# Patient Record
Sex: Male | Born: 1940
Health system: Southern US, Community
[De-identification: ages and names within clinical notes are randomized; demographics above are authoritative.]

## PROBLEM LIST (undated history)

## (undated) DIAGNOSIS — I4819 Other persistent atrial fibrillation: Secondary | ICD-10-CM

## (undated) DIAGNOSIS — M47816 Spondylosis without myelopathy or radiculopathy, lumbar region: Secondary | ICD-10-CM

## (undated) DIAGNOSIS — M419 Scoliosis, unspecified: Secondary | ICD-10-CM

## (undated) DIAGNOSIS — M549 Dorsalgia, unspecified: Secondary | ICD-10-CM

## (undated) DIAGNOSIS — Z7901 Long term (current) use of anticoagulants: Secondary | ICD-10-CM

## (undated) DIAGNOSIS — Z8709 Personal history of other diseases of the respiratory system: Secondary | ICD-10-CM

## (undated) DIAGNOSIS — G20A1 Parkinson's disease without dyskinesia, without mention of fluctuations: Secondary | ICD-10-CM

## (undated) DIAGNOSIS — G20 Parkinson's disease: Secondary | ICD-10-CM

## (undated) HISTORY — DX: Dorsalgia, unspecified: M54.9

## (undated) HISTORY — DX: Scoliosis, unspecified: M41.9

## (undated) HISTORY — DX: Spondylosis without myelopathy or radiculopathy, lumbar region: M47.816

## (undated) HISTORY — PX: HERNIA REPAIR: SHX51

## (undated) HISTORY — DX: Parkinson's disease: G20

## (undated) HISTORY — PX: TONSILLECTOMY: SUR1361

## (undated) HISTORY — DX: Long term (current) use of anticoagulants: Z79.01

## (undated) HISTORY — DX: Other persistent atrial fibrillation: I48.19

## (undated) HISTORY — DX: Parkinson's disease without dyskinesia, without mention of fluctuations: G20.A1

---

## 2011-11-13 DIAGNOSIS — J40 Bronchitis, not specified as acute or chronic: Secondary | ICD-10-CM | POA: Diagnosis not present

## 2011-11-21 DIAGNOSIS — J209 Acute bronchitis, unspecified: Secondary | ICD-10-CM | POA: Diagnosis not present

## 2011-11-28 DIAGNOSIS — J209 Acute bronchitis, unspecified: Secondary | ICD-10-CM | POA: Diagnosis not present

## 2011-12-03 DIAGNOSIS — R05 Cough: Secondary | ICD-10-CM | POA: Diagnosis not present

## 2011-12-03 DIAGNOSIS — R0602 Shortness of breath: Secondary | ICD-10-CM | POA: Diagnosis not present

## 2011-12-03 DIAGNOSIS — J40 Bronchitis, not specified as acute or chronic: Secondary | ICD-10-CM | POA: Diagnosis not present

## 2012-07-30 DIAGNOSIS — I809 Phlebitis and thrombophlebitis of unspecified site: Secondary | ICD-10-CM | POA: Diagnosis not present

## 2012-07-30 DIAGNOSIS — M7989 Other specified soft tissue disorders: Secondary | ICD-10-CM | POA: Diagnosis not present

## 2012-07-30 DIAGNOSIS — L0390 Cellulitis, unspecified: Secondary | ICD-10-CM | POA: Diagnosis not present

## 2012-07-30 DIAGNOSIS — L0291 Cutaneous abscess, unspecified: Secondary | ICD-10-CM | POA: Diagnosis not present

## 2012-07-30 DIAGNOSIS — I803 Phlebitis and thrombophlebitis of lower extremities, unspecified: Secondary | ICD-10-CM | POA: Diagnosis not present

## 2012-07-30 DIAGNOSIS — L03119 Cellulitis of unspecified part of limb: Secondary | ICD-10-CM | POA: Diagnosis not present

## 2012-08-20 ENCOUNTER — Inpatient Hospital Stay (HOSPITAL_COMMUNITY)
Admission: EM | Admit: 2012-08-20 | Discharge: 2012-08-22 | DRG: 603 | Disposition: A | Discharge status: Home / Self Care | Payer: Medicare Other | Attending: Family Medicine | Admitting: Family Medicine

## 2012-08-20 ENCOUNTER — Emergency Department (HOSPITAL_COMMUNITY): Payer: Medicare Other

## 2012-08-20 ENCOUNTER — Encounter (HOSPITAL_COMMUNITY): Payer: Self-pay

## 2012-08-20 DIAGNOSIS — L0390 Cellulitis, unspecified: Secondary | ICD-10-CM | POA: Diagnosis not present

## 2012-08-20 DIAGNOSIS — L02419 Cutaneous abscess of limb, unspecified: Secondary | ICD-10-CM | POA: Diagnosis not present

## 2012-08-20 DIAGNOSIS — I803 Phlebitis and thrombophlebitis of lower extremities, unspecified: Secondary | ICD-10-CM | POA: Diagnosis not present

## 2012-08-20 DIAGNOSIS — L0291 Cutaneous abscess, unspecified: Secondary | ICD-10-CM | POA: Diagnosis not present

## 2012-08-20 DIAGNOSIS — L03116 Cellulitis of left lower limb: Secondary | ICD-10-CM | POA: Diagnosis present

## 2012-08-20 DIAGNOSIS — I809 Phlebitis and thrombophlebitis of unspecified site: Secondary | ICD-10-CM | POA: Diagnosis present

## 2012-08-20 DIAGNOSIS — N289 Disorder of kidney and ureter, unspecified: Secondary | ICD-10-CM | POA: Diagnosis present

## 2012-08-20 DIAGNOSIS — Z0389 Encounter for observation for other suspected diseases and conditions ruled out: Secondary | ICD-10-CM | POA: Diagnosis not present

## 2012-08-20 DIAGNOSIS — L03119 Cellulitis of unspecified part of limb: Principal | ICD-10-CM

## 2012-08-20 DIAGNOSIS — M7989 Other specified soft tissue disorders: Secondary | ICD-10-CM

## 2012-08-20 DIAGNOSIS — M25579 Pain in unspecified ankle and joints of unspecified foot: Secondary | ICD-10-CM | POA: Diagnosis not present

## 2012-08-20 HISTORY — DX: Personal history of other diseases of the respiratory system: Z87.09

## 2012-08-20 LAB — CBC WITH DIFFERENTIAL/PLATELET
Basophils Absolute: 0 10*3/uL (ref 0.0–0.1)
Eosinophils Absolute: 0.3 10*3/uL (ref 0.0–0.7)
Eosinophils Relative: 5 % (ref 0–5)
Lymphocytes Relative: 20 % (ref 12–46)
MCH: 31.3 pg (ref 26.0–34.0)
MCV: 88.8 fL (ref 78.0–100.0)
Platelets: 226 10*3/uL (ref 150–400)
RDW: 13.1 % (ref 11.5–15.5)
WBC: 6 10*3/uL (ref 4.0–10.5)

## 2012-08-20 LAB — COMPREHENSIVE METABOLIC PANEL
ALT: 17 U/L (ref 0–53)
AST: 17 U/L (ref 0–37)
Calcium: 9.1 mg/dL (ref 8.4–10.5)
Sodium: 138 mEq/L (ref 135–145)
Total Protein: 5.9 g/dL — ABNORMAL LOW (ref 6.0–8.3)

## 2012-08-20 MED ORDER — ONDANSETRON HCL 4 MG PO TABS: 4.0000 mg | ORAL_TABLET | Freq: Four times a day (QID) | ORAL | Status: DC | PRN

## 2012-08-20 MED ORDER — VANCOMYCIN HCL IN DEXTROSE 1-5 GM/200ML-% IV SOLN
1000.0000 mg | Freq: Once | INTRAVENOUS | Status: AC
  Administered 2012-08-20: 1000 mg via INTRAVENOUS
  Filled 2012-08-20: qty 200

## 2012-08-20 MED ORDER — SODIUM CHLORIDE 0.9 % IJ SOLN: 3.0000 mL | INTRAMUSCULAR | Status: DC | PRN

## 2012-08-20 MED ORDER — SODIUM CHLORIDE 0.9 % IV SOLN
INTRAVENOUS | Status: AC
  Administered 2012-08-20: 12:00:00 via INTRAVENOUS

## 2012-08-20 MED ORDER — HYDROCODONE-ACETAMINOPHEN 5-325 MG PO TABS: 1.0000 | ORAL_TABLET | ORAL | Status: DC | PRN

## 2012-08-20 MED ORDER — ONDANSETRON HCL 4 MG/2ML IJ SOLN: 4.0000 mg | Freq: Four times a day (QID) | INTRAMUSCULAR | Status: DC | PRN

## 2012-08-20 MED ORDER — ONDANSETRON HCL 4 MG/2ML IJ SOLN: 4.0000 mg | Freq: Three times a day (TID) | INTRAMUSCULAR | Status: DC | PRN

## 2012-08-20 MED ORDER — INFLUENZA VIRUS VACC SPLIT PF IM SUSP
0.5000 mL | INTRAMUSCULAR | Status: AC
  Administered 2012-08-21: 0.5 mL via INTRAMUSCULAR
  Filled 2012-08-20: qty 0.5

## 2012-08-20 MED ORDER — IBUPROFEN 600 MG PO TABS
600.0000 mg | ORAL_TABLET | Freq: Four times a day (QID) | ORAL | Status: DC | PRN
  Filled 2012-08-20: qty 1

## 2012-08-20 MED ORDER — SODIUM CHLORIDE 0.9 % IV SOLN: 250.0000 mL | INTRAVENOUS | Status: DC | PRN

## 2012-08-20 MED ORDER — ACETAMINOPHEN 325 MG PO TABS: 650.0000 mg | ORAL_TABLET | Freq: Four times a day (QID) | ORAL | Status: DC | PRN

## 2012-08-20 MED ORDER — ACETAMINOPHEN 650 MG RE SUPP: 650.0000 mg | Freq: Four times a day (QID) | RECTAL | Status: DC | PRN

## 2012-08-20 MED ORDER — ENOXAPARIN SODIUM 40 MG/0.4ML ~~LOC~~ SOLN
40.0000 mg | SUBCUTANEOUS | Status: DC
  Administered 2012-08-20 – 2012-08-21 (×2): 40 mg via SUBCUTANEOUS
  Filled 2012-08-20 (×3): qty 0.4

## 2012-08-20 MED ORDER — SODIUM CHLORIDE 0.9 % IJ SOLN
3.0000 mL | Freq: Two times a day (BID) | INTRAMUSCULAR | Status: DC
  Administered 2012-08-21 – 2012-08-22 (×2): 3 mL via INTRAVENOUS

## 2012-08-20 MED ORDER — LEVOFLOXACIN IN D5W 500 MG/100ML IV SOLN
500.0000 mg | INTRAVENOUS | Status: DC
  Administered 2012-08-20 – 2012-08-21 (×2): 500 mg via INTRAVENOUS
  Filled 2012-08-20 (×2): qty 100

## 2012-08-20 MED ORDER — ONDANSETRON HCL 4 MG/2ML IJ SOLN
4.0000 mg | Freq: Once | INTRAMUSCULAR | Status: AC
  Administered 2012-08-20: 4 mg via INTRAVENOUS
  Filled 2012-08-20: qty 2

## 2012-08-20 MED ORDER — MORPHINE SULFATE 4 MG/ML IJ SOLN
4.0000 mg | Freq: Once | INTRAMUSCULAR | Status: AC
  Administered 2012-08-20: 4 mg via INTRAVENOUS
  Filled 2012-08-20: qty 1

## 2012-08-20 NOTE — H&P (Signed)
History and Physical  Sean Ward ZOX:[id number] DOB: 1941-01-26 DOA: 08/20/2012  Referring physician: Randel Books, MD PCP: No primary provider on file. None, just moved to the area  Chief Complaint: foot swelling  HPI:  71 year old man no significant PMH presented with nearly 3 week history of LLE/foot edema, some erythema; EDP diagnosed with cellulitis and referred for admission.  Symptoms began 3 weeks ago with edema and some redness of the foot. No injury. Seen at ED in [facility] 10/9, diagnosed with phlebitis, started on 5 days of Keflex and PRN narcotics without relief. Was compliant with medications but had no improvement. Edema especially worse at night in foot and calf. Localizes pain to medial malleolus and plantar surface.. No history of injury to LLE or previous illnesses or problems with that leg. Very active, plays tennis frequently.  In ED was afebrile with stable vitals. CBC, CMP unremarkable. Ankle x-ray negative.  Review of Systems:   Negative for fever, visual changes, sore throat,chest pain, SOB, dysuria, bleeding, n/v/abdominal pain.   Past Medical History  Diagnosis Date  . History of bronchitis     Past Surgical History  Procedure Date  . None     Social History:  reports that he has never smoked. He has never used smokeless tobacco. He reports that he drinks alcohol. He reports that he does not use illicit drugs.  No Known Allergies  History reviewed. No pertinent family history. He denies any particular medical problems in family.   Prior to Admission medications   Medication Sig Start Date End Date Taking? Authorizing Provider  ibuprofen (ADVIL,MOTRIN) 200 MG tablet Take 200-400 mg by mouth every 6 (six) hours as needed. pain   Yes Historical Provider, MD  Multiple Vitamin (MULTIVITAMIN WITH MINERALS) TABS Take 1 tablet by mouth daily.   Yes Historical Provider, MD  oxyCODONE-acetaminophen (PERCOCET/ROXICET) 5-325 MG per tablet Take 1 tablet by mouth  every 4 (four) hours as needed. Pain   Yes Historical Provider, MD  cephALEXin (KEFLEX) 500 MG capsule Take 500 mg by mouth 3 (three) times daily. 5 day supply started on 07/31/12    Historical Provider, MD   Physical Exam: Filed Vitals:   08/20/12 1048 08/20/12 1053 08/20/12 1054 08/20/12 1403  BP: 123/64 115/74 132/78 138/77  Pulse: 69 75 66 77  Temp:    98 F (36.7 C)  TempSrc:    Oral  Resp:    18  Height:      Weight:      SpO2:    99%    General:  Appears calm and comfortable Eyes: PERRL, normal lids, irises ENT: grossly normal hearing, lips & tongue Neck: no LAD, masses or thyromegaly Cardiovascular: RRR, no m/r/g. No RLE edema. Respiratory: CTA bilaterally, no w/r/r. Normal respiratory effort. Abdomen: soft, ntnd Skin: right foot appears normal. Left foot edematous with loss of bony contours. Mild erythema of ankle, dorsum of foot. Plantar surface unremarkable. Blister between 3/4 toe, dry. No other lesions. Minimal pain with palpation of ankle and foot. Obvious inflamed vein left foot (greater saphenous), non-tender, erythema confined to just above ankle. Musculoskeletal: grossly normal tone BUE/BLE Psychiatric: grossly normal mood and affect, speech fluent and appropriate Neurologic: grossly non-focal.  Labs on Admission:  Basic Metabolic Panel:  Lab 08/20/12 8119  NA 138  K 3.6  CL 103  CO2 25  GLUCOSE 91  BUN 21  CREATININE 1.08  CALCIUM 9.1  MG --  PHOS --    Liver Function Tests:  Lab 08/20/12  1000  AST 17  ALT 17  ALKPHOS 107  BILITOT 0.7  PROT 5.9*  ALBUMIN 3.2*   CBC:  Lab 08/20/12 1000  WBC 6.0  NEUTROABS 3.9  HGB 14.3  HCT 40.6  MCV 88.8  PLT 226    Cardiac Enzymes:  Lab 08/20/12 1000  CKTOTAL --  CKMB --  CKMBINDEX --  TROPONINI <0.30   Radiological Exams on Admission: Dg Ankle Complete Left  08/20/2012  *RADIOLOGY REPORT*  Clinical Data: Ankle pain.  LEFT ANKLE COMPLETE - 3+ VIEW  Comparison: None.  Findings: No  fracture or dislocation is noted.  Joint spaces are intact.  Soft tissue swelling is seen over lateral malleolus.  IMPRESSION: No bony abnormalities seen.  Soft tissue swelling seen over lateral malleolus suggesting cellulitis or ligamentous injury.   Original Report Authenticated By: Venita Sheffield., M.D.     Active Problems:  * No active hospital problems. *    Assessment/Plan 1. LLE cellulitis--previously treated with Keflex. Doubt MRSA. Empiric Levaquin. No evidence of suppurative thrombophlebitis. 2. LLE superficial phlebitis--elevation, ice/heat compresses, compression stocking.  Code Status: full  Family Communication: none present Disposition Plan/Anticipated LOS: 2-3 days  Time spent: 55 minutes  [name], MD  [facility] Team 6 Pager [phone]. If 8PM-8AM, please contact night-coverage at [email or web address], password [facility] 08/20/2012, 4:32 PM

## 2012-08-20 NOTE — ED Notes (Signed)
MD at bedside. 

## 2012-08-20 NOTE — ED Notes (Signed)
Pt reports cellulitis in LLE x 3 weeks.  Sts he completed prescribed antibiotic, but "it hasn't gotten any better."  Sts vertigo x 2 weeks.  Sts pain in LLE when standing.  Edema in LLE noted.

## 2012-08-20 NOTE — Progress Notes (Signed)
No pcp listed Cm spoke to pt who confirms no pcp but knows how to obtain one when needed

## 2012-08-20 NOTE — ED Notes (Signed)
EDP Rancour present to evaluate this pt.  Pt reports increased swelling since treatment for cellulitis 07/30/12.  Good CMS- pulses palpable to lower ext.

## 2012-08-20 NOTE — ED Provider Notes (Signed)
History     CSN: [id number]  Arrival date & time 08/20/12  0919   First MD Initiated Contact with Patient 08/20/12 0940      Chief Complaint  Patient presents with  . Dizziness  . Cellulitis    (Consider location/radiation/quality/duration/timing/severity/associated sxs/prior treatment) HPI Comments: Patient presents with pain and swelling to his left lower charities for the past 3 weeks. He states it started swelling around October 7 denies any cuts or falls or trauma. He was seen at the ED in [facility] on October 9 and treated for cellulitis and phlebitis with Keflex. He stated he had a negative Doppler ultrasound then. He completed a course of antibiotics but the pain and swelling is worsening. Denies any fevers, chills, nausea vomiting. Denies any chest pain or shortness of breath. No recent travel. He denies any other medical problems. He endorses he said intermittent lightheadedness over the past several weeks it is positional. Denies vertigo, contrary to triage note.   The history is provided by the patient.    Past Medical History  Diagnosis Date  . History of bronchitis     Past Surgical History  Procedure Date  . None     History reviewed. No pertinent family history.  History  Substance Use Topics  . Smoking status: Never Smoker   . Smokeless tobacco: Never Used  . Alcohol Use: Yes     wine occasionally      Review of Systems  Constitutional: Negative for fever, activity change and appetite change.  HENT: Negative for congestion and rhinorrhea.   Respiratory: Negative for cough, chest tightness and shortness of breath.   Gastrointestinal: Negative for nausea, vomiting and abdominal pain.  Genitourinary: Negative for dysuria and hematuria.  Musculoskeletal: Negative for back pain.  Skin: Positive for wound. Negative for rash.  Neurological: Positive for dizziness and light-headedness. Negative for headaches.    Allergies  Review of patient's  allergies indicates no known allergies.  Home Medications   No current outpatient prescriptions on file.  BP 138/77  Pulse 77  Temp 98 F (36.7 C) (Oral)  Resp 18  Ht 5\' 8"  (1.727 m)  Wt 172 lb (78.019 kg)  BMI 26.15 kg/m2  SpO2 99%  Physical Exam  Constitutional: He is oriented to person, place, and time. He appears well-developed and well-nourished. No distress.  HENT:  Head: Normocephalic and atraumatic.  Mouth/Throat: Oropharynx is clear and moist. No oropharyngeal exudate.  Eyes: Conjunctivae normal and EOM are normal. Pupils are equal, round, and reactive to light.  Neck: Normal range of motion. Neck supple.  Cardiovascular: Normal rate, regular rhythm and normal heart sounds.   No murmur heard. Pulmonary/Chest: Effort normal and breath sounds normal. No respiratory distress.  Abdominal: Soft. There is no tenderness. There is no rebound and no guarding.  Musculoskeletal: Normal range of motion. He exhibits edema and tenderness.       Edema and tenderness to distal L tibia, ankle, foot.  +2 DP and PT pulses. Compartments soft, no palpable cord.  TTP over anterior shin.  No open wounds. FROM ankle without pain.  Neurological: He is alert and oriented to person, place, and time. No cranial nerve deficit.       5/5 strength throughout, no ataxia on finger to nose, no nystagmus. Test of skew negative, head impulse testing negative, normal gait.  Skin: Skin is warm.    ED Course  Procedures (including critical care time)  Labs Reviewed  COMPREHENSIVE METABOLIC PANEL - Abnormal;  Notable for the following:    Total Protein 5.9 (*)     Albumin 3.2 (*)     GFR calc non Af Amer 68 (*)     GFR calc Af Amer 78 (*)     All other components within normal limits  CBC WITH DIFFERENTIAL  TROPONIN I   Dg Ankle Complete Left  08/20/2012  *RADIOLOGY REPORT*  Clinical Data: Ankle pain.  LEFT ANKLE COMPLETE - 3+ VIEW  Comparison: None.  Findings: No fracture or dislocation is noted.   Joint spaces are intact.  Soft tissue swelling is seen over lateral malleolus.  IMPRESSION: No bony abnormalities seen.  Soft tissue swelling seen over lateral malleolus suggesting cellulitis or ligamentous injury.   Original Report Authenticated By: Venita Sheffield., M.D.      1. Cellulitis       MDM  LLE swelling, warmth x 2 weeks.  Also positional lightheadedness that is intermittent and worse with standing.  No CP or SOB.  No vertigo. Treated for cellulitis/phelbitis with keflex.  Exam not overly impressive for cellulitis. No evidence of septic joint. Doppler negative for DVT. Will treat with IV antibiotics, observation as patient has essentially failed outpatient treatment.    Date: 08/20/2012  Rate: 63  Rhythm: normal sinus rhythm  QRS Axis: normal  Intervals: normal  ST/T Wave abnormalities: normal  Conduction Disutrbances:none  Narrative Interpretation:   Old EKG Reviewed: none available      Glynn Octave, MD 08/20/12 1643

## 2012-08-20 NOTE — Progress Notes (Signed)
Left:  No evidence of DVT, superficial thrombosis, or Baker's cyst.  Right:  Negative for DVT in the common femoral vein.  

## 2012-08-20 NOTE — ED Notes (Signed)
Vascular  US present at bs.  Delorise Shiner NT aware of pending EKG and orthostatics.

## 2012-08-20 NOTE — Progress Notes (Signed)
Initial review for inpatient status is complete. 

## 2012-08-21 LAB — BASIC METABOLIC PANEL
Chloride: 104 mEq/L (ref 96–112)
GFR calc Af Amer: 59 mL/min — ABNORMAL LOW (ref >90)
GFR calc non Af Amer: 51 mL/min — ABNORMAL LOW (ref >90)
Glucose, Bld: 102 mg/dL — ABNORMAL HIGH (ref 70–99)
Potassium: 4 mEq/L (ref 3.5–5.1)
Sodium: 139 mEq/L (ref 135–145)

## 2012-08-21 LAB — CBC
HCT: 40.6 % (ref 39.0–52.0)
Hemoglobin: 14.2 g/dL (ref 13.0–17.0)
MCHC: 35 g/dL (ref 30.0–36.0)
RBC: 4.54 MIL/uL (ref 4.22–5.81)
WBC: 7.2 10*3/uL (ref 4.0–10.5)

## 2012-08-21 NOTE — Progress Notes (Signed)
[facility] PROGRESS NOTE  Braden Cimo XBJ:[id number] DOB: 10/09/1941 DOA: 13-Sep-2012 PCP: No primary provider on file. None, just moved to the area  Assessment/Plan: 1. LLE cellulitis--improving. Continue empiric Levaquin. No evidence of suppurative thrombophlebitis. 2. LLE superficial phlebitis--improving with elevation, ice/heat compresses, compression stocking. 3. Mild renal insufficiency--check BMP in AM. Etiology and significance unclear.  Code Status: full  Family Communication: none present  Disposition Plan: likely home 11/1  [name], MD  [facility] Team 6 Pager [phone]. If 8PM-8AM, please contact night-coverage at [email or web address], password [facility] 08/21/2012, 5:15 PM  LOS: 1 day   Brief narrative: 71 year old man no significant PMH presented with nearly 3 week history of LLE/foot edema, some erythema; EDP diagnosed with cellulitis and referred for admission.  Consultants:  none  Procedures:  none  Antibiotics:  Levaquin 09/14/2023 >>  HPI/Subjective: Feels better, less pain, redness and edema decreasing, walking without difficulty.  Objective: Filed Vitals:   09-13-2012 1403 09-13-12 2148 08/21/12 0548 08/21/12 1408  BP: 138/77 127/65 105/54 116/68  Pulse: 77 69 69 76  Temp: 98 F (36.7 C) 98.1 F (36.7 C) 98.6 F (37 C) 97.5 F (36.4 C)  TempSrc: Oral Oral Oral Oral  Resp: 18 16 16 18   Height:      Weight:      SpO2: 99% 97% 96% 100%    Intake/Output Summary (Last 24 hours) at 08/21/12 1715 Last data filed at 08/21/12 0900  Gross per 24 hour  Intake    680 ml  Output      0 ml  Net    680 ml   Filed Weights   09-13-12 0936  Weight: 78.019 kg (172 lb)    Exam:  General:  Appears calm and comfortable Cardiovascular: RRR, no m/r/g. No RLE edema. Respiratory: CTA bilaterally, no w/r/r. Normal respiratory effort. Abdomen: soft, ntnd Skin: decreased erythema of ankle, vein, foot. Decreased edema with skin wrinkling. No pain  with palpation of medial malleolus or sole of foot. Musculoskeletal: grossly normal tone BUE/BLE Psychiatric: grossly normal mood and affect, speech fluent and appropriate  Data Reviewed: Basic Metabolic Panel:  Lab 08/21/12 4696 September 13, 2012 1000  NA 139 138  K 4.0 3.6  CL 104 103  CO2 27 25  GLUCOSE 102* 91  BUN 15 21  CREATININE 1.36* 1.08  CALCIUM 9.0 9.1  MG -- --  PHOS -- --   Liver Function Tests:  Lab 09-13-12 1000  AST 17  ALT 17  ALKPHOS 107  BILITOT 0.7  PROT 5.9*  ALBUMIN 3.2*   CBC:  Lab 08/21/12 0430 13-Sep-2012 1000  WBC 7.2 6.0  NEUTROABS -- 3.9  HGB 14.2 14.3  HCT 40.6 40.6  MCV 89.4 88.8  PLT 220 226   Cardiac Enzymes:  Lab September 13, 2012 1000  CKTOTAL --  CKMB --  CKMBINDEX --  TROPONINI <0.30   Studies: Dg Ankle Complete Left  09/13/2012  *RADIOLOGY REPORT*  Clinical Data: Ankle pain.  LEFT ANKLE COMPLETE - 3+ VIEW  Comparison: None.  Findings: No fracture or dislocation is noted.  Joint spaces are intact.  Soft tissue swelling is seen over lateral malleolus.  IMPRESSION: No bony abnormalities seen.  Soft tissue swelling seen over lateral malleolus suggesting cellulitis or ligamentous injury.   Original Report Authenticated By: Venita Sheffield., M.D.    Scheduled Meds:   . sodium chloride   Intravenous STAT  . enoxaparin (LOVENOX) injection  40 mg Subcutaneous Q24H  . influenza  inactive virus vaccine  0.5  mL Intramuscular Tomorrow-1000  . levofloxacin (LEVAQUIN) IV  500 mg Intravenous Q24H  . sodium chloride  3 mL Intravenous Q12H   Continuous Infusions:   Active Problems:  Left leg cellulitis  Superficial phlebitis     [name], MD  [facility] Team 6 Pager [phone]. If 8PM-8AM, please contact night-coverage at [email or web address], password [facility] 08/21/2012, 5:15 PM  LOS: 1 day   Time spent: 20 minutes

## 2012-08-22 LAB — BASIC METABOLIC PANEL
BUN: 20 mg/dL (ref 6–23)
CO2: 26 mEq/L (ref 19–32)
Chloride: 104 mEq/L (ref 96–112)
GFR calc Af Amer: 70 mL/min — ABNORMAL LOW (ref >90)
Potassium: 3.8 mEq/L (ref 3.5–5.1)

## 2012-08-22 MED ORDER — LEVOFLOXACIN 500 MG PO TABS: 500.0000 mg | ORAL_TABLET | Freq: Every day | ORAL | Status: DC

## 2012-08-22 NOTE — Discharge Summary (Signed)
Physician Discharge Summary  Sean Ward ZOX:[id number] DOB: December 04, 1940 DOA: 09-Sep-2012  PCP: No primary provider on file. None, just moved to the area  Admit date: 09/09/2012 Discharge date: 08/22/2012  Recommendations for Outpatient Follow-up:  1. Follow-up resolution of cellulitis and superficial phlebitis. 2. General health screen/physical   Discharge Diagnoses:  1. LLE cellulitis 2. LLE superficial phlebitis   Discharge Condition: improved Disposition: home  Diet recommendation: regular  History of present illness:  71 year old man no significant PMH presented with nearly 3 week history of LLE/foot edema, some erythema; EDP diagnosed with cellulitis and referred for admission. No history of injury. Foot exam was notable for inflamed superficial vein, erythema and healing/dry blister between toes.  Hospital Course:  Sean Ward was admitted for treatment of persistent cellulitis, superficial phlebitis and edema of the left foot. X-ray of ankle suggested cellulitis, there was no evidence of fracture. LLE venous duplex was negative for DVT. Given no history of trauma and minimal pain ligamentous injury was not thought likely. Cellulitis and superficial phlebitis rapidly improved with IV Levaquin, elevation, ice and compression hose. Patient is ambulating without difficulty. He just moved to the area, has insurance and is willing to establish with PCP, which has been recommended. Referral to insurance documentation for accepting physicians was recommended and outpatient follow-up in 1-2 weeks was recommended.  1. LLE cellulitis--rapidly improving. Continue empiric Levaquin. No evidence of suppurative thrombophlebitis.  2. LLE superficial phlebitis--continues to improve with elevation, ice/heat compresses, compression stocking.  3. Mild renal insufficiency--resolved, no further evaluation suggested.  Consultants: 1. 10/30--LLE venous duplex--No evidence of deep vein or superficial  thrombosis involving the left lower extremity and right common femoral vein.  Procedures:  none  Antibiotics:  Levaquin 10-Sep-2023 >> 11/5  Discharge Instructions  Discharge Orders    Future Orders Please Complete By Expires   Diet general      Increase activity slowly      Discharge instructions      Comments:   Be sure to complete antibiotic (Levaquin). Continue to keep left leg and foot elevated when not ambulating. Continue compression stocking as tolerated. Ice ankle as tolerated. Establishing with a primary care physician in the area is recommended, with follow-up in 1-2 weeks. Call physician or seek immediate medical attention for increased pain, swelling, redness.       Medication List     As of 08/22/2012 10:58 AM    STOP taking these medications         cephALEXin 500 MG capsule   Commonly known as: KEFLEX      oxyCODONE-acetaminophen 5-325 MG per tablet   Commonly known as: PERCOCET/ROXICET      TAKE these medications         ibuprofen 200 MG tablet   Commonly known as: ADVIL,MOTRIN   Take 200-400 mg by mouth every 6 (six) hours as needed. pain      levofloxacin 500 MG tablet   Commonly known as: LEVAQUIN   Take 1 tablet (500 mg total) by mouth daily. Take first dose 11/1 at 6 pm.      multivitamin with minerals Tabs   Take 1 tablet by mouth daily.          The results of significant diagnostics from this hospitalization (including imaging, microbiology, ancillary and laboratory) are listed below for reference.    Significant Diagnostic Studies: Dg Ankle Complete Left  September 09, 2012  *RADIOLOGY REPORT*  Clinical Data: Ankle pain.  LEFT ANKLE COMPLETE - 3+ VIEW  Comparison: None.  Findings: No fracture or dislocation is noted.  Joint spaces are intact.  Soft tissue swelling is seen over lateral malleolus.  IMPRESSION: No bony abnormalities seen.  Soft tissue swelling seen over lateral malleolus suggesting cellulitis or ligamentous injury.   Original Report  Authenticated By: Venita Sheffield., M.D.    Labs: Basic Metabolic Panel:  Lab 08/22/12 1610 08/21/12 0430 08/20/12 1000  NA 138 139 138  K 3.8 4.0 3.6  CL 104 104 103  CO2 26 27 25   GLUCOSE 88 102* 91  BUN 20 15 21   CREATININE 1.18 1.36* 1.08  CALCIUM 8.5 9.0 9.1  MG -- -- --  PHOS -- -- --   Liver Function Tests:  Lab 08/20/12 1000  AST 17  ALT 17  ALKPHOS 107  BILITOT 0.7  PROT 5.9*  ALBUMIN 3.2*   CBC:  Lab 08/21/12 0430 08/20/12 1000  WBC 7.2 6.0  NEUTROABS -- 3.9  HGB 14.2 14.3  HCT 40.6 40.6  MCV 89.4 88.8  PLT 220 226   Cardiac Enzymes:  Lab 08/20/12 1000  CKTOTAL --  CKMB --  CKMBINDEX --  TROPONINI <0.30   Active Problems:  Left leg cellulitis  Superficial phlebitis   Time coordinating discharge: 20 minutes  Signed:  [name], MD [facility] 08/22/2012, 10:58 AM

## 2012-08-22 NOTE — Progress Notes (Signed)
[facility] PROGRESS NOTE  Sean Ward:[id number] DOB: 1940/12/11 DOA: 2012-09-06 PCP: No primary provider on file. None, just moved to the area  Assessment/Plan: 1. LLE cellulitis--rapidly improving. Continue empiric Levaquin. No evidence of suppurative thrombophlebitis. 2. LLE superficial phlebitis--continues to improve with elevation, ice/heat compresses, compression stocking. 3. Mild renal insufficiency--resolved, no further evaluation suggested.  Code Status: full  Family Communication: none present  Disposition Plan: home today  [name], MD  [facility] Team 6 Pager [phone]. If 8PM-8AM, please contact night-coverage at [email or web address], password [facility] 08/22/2012, 10:48 AM  LOS: 2 days   Brief narrative: 71 year old man no significant PMH presented with nearly 3 week history of LLE/foot edema, some erythema; EDP diagnosed with cellulitis and referred for admission.  Consultants:  none  Procedures:  none  Antibiotics:  Levaquin 09/07/23 >> 11/5  HPI/Subjective: Continues to feel better, less foot and leg edema, less redness. Minimal pain. Ambulating without difficulty or pain.  Objective: Filed Vitals:   08/21/12 0548 08/21/12 1408 08/21/12 2100 08/22/12 0453  BP: 105/54 116/68 118/66 123/67  Pulse: 69 76 76 79  Temp: 98.6 F (37 C) 97.5 F (36.4 C) 98.7 F (37.1 C) 98.4 F (36.9 C)  TempSrc: Oral Oral Oral Oral  Resp: 16 18 18 16   Height:      Weight:      SpO2: 96% 100% 99% 98%    Intake/Output Summary (Last 24 hours) at 08/22/12 1048 Last data filed at 08/22/12 1045  Gross per 24 hour  Intake    920 ml  Output      0 ml  Net    920 ml   Filed Weights   09-06-12 0936  Weight: 78.019 kg (172 lb)    Exam:  General:  Appears calm and comfortable Cardiovascular: RRR, no m/r/g. No RLE edema. Respiratory: CTA bilaterally, no w/r/r. Normal respiratory effort. Skin: marked decreased erythema of superficial saphenous vein, foot.  Marked decreased edema. No significant pain with palpation of medial malleolus or sole of foot. Psychiatric: grossly normal mood and affect, speech fluent and appropriate  Data Reviewed: Basic Metabolic Panel:  Lab 08/22/12 8295 08/21/12 0430 09/06/12 1000  NA 138 139 138  K 3.8 4.0 3.6  CL 104 104 103  CO2 26 27 25   GLUCOSE 88 102* 91  BUN 20 15 21   CREATININE 1.18 1.36* 1.08  CALCIUM 8.5 9.0 9.1  MG -- -- --  PHOS -- -- --   Liver Function Tests:  Lab 2012-09-06 1000  AST 17  ALT 17  ALKPHOS 107  BILITOT 0.7  PROT 5.9*  ALBUMIN 3.2*   CBC:  Lab 08/21/12 0430 2012/09/06 1000  WBC 7.2 6.0  NEUTROABS -- 3.9  HGB 14.2 14.3  HCT 40.6 40.6  MCV 89.4 88.8  PLT 220 226   Cardiac Enzymes:  Lab 09-06-12 1000  CKTOTAL --  CKMB --  CKMBINDEX --  TROPONINI <0.30   Studies: Dg Ankle Complete Left  Sep 06, 2012  *RADIOLOGY REPORT*  Clinical Data: Ankle pain.  LEFT ANKLE COMPLETE - 3+ VIEW  Comparison: None.  Findings: No fracture or dislocation is noted.  Joint spaces are intact.  Soft tissue swelling is seen over lateral malleolus.  IMPRESSION: No bony abnormalities seen.  Soft tissue swelling seen over lateral malleolus suggesting cellulitis or ligamentous injury.   Original Report Authenticated By: Venita Sheffield., M.D.    Scheduled Meds:    . enoxaparin (LOVENOX) injection  40 mg Subcutaneous Q24H  . levofloxacin (LEVAQUIN)  IV  500 mg Intravenous Q24H  . sodium chloride  3 mL Intravenous Q12H   Continuous Infusions:   Active Problems:  Left leg cellulitis  Superficial phlebitis     [name], MD  [facility] Team 6 Pager [phone]. If 8PM-8AM, please contact night-coverage at [email or web address], password [facility] 08/22/2012, 10:48 AM  LOS: 2 days

## 2012-09-10 DIAGNOSIS — R609 Edema, unspecified: Secondary | ICD-10-CM | POA: Diagnosis not present

## 2012-09-17 DIAGNOSIS — E860 Dehydration: Secondary | ICD-10-CM | POA: Diagnosis not present

## 2012-09-17 DIAGNOSIS — L0291 Cutaneous abscess, unspecified: Secondary | ICD-10-CM | POA: Diagnosis not present

## 2012-09-17 DIAGNOSIS — L0390 Cellulitis, unspecified: Secondary | ICD-10-CM | POA: Diagnosis not present

## 2012-09-17 DIAGNOSIS — R609 Edema, unspecified: Secondary | ICD-10-CM | POA: Diagnosis not present

## 2012-11-27 DIAGNOSIS — R609 Edema, unspecified: Secondary | ICD-10-CM | POA: Diagnosis not present

## 2012-12-10 ENCOUNTER — Other Ambulatory Visit: Payer: Self-pay | Admitting: Internal Medicine

## 2012-12-10 DIAGNOSIS — M159 Polyosteoarthritis, unspecified: Secondary | ICD-10-CM | POA: Diagnosis not present

## 2012-12-10 DIAGNOSIS — I83892 Varicose veins of left lower extremities with other complications: Secondary | ICD-10-CM

## 2012-12-10 DIAGNOSIS — R5381 Other malaise: Secondary | ICD-10-CM | POA: Diagnosis not present

## 2012-12-10 DIAGNOSIS — R5383 Other fatigue: Secondary | ICD-10-CM | POA: Diagnosis not present

## 2012-12-10 DIAGNOSIS — R609 Edema, unspecified: Secondary | ICD-10-CM | POA: Diagnosis not present

## 2012-12-10 DIAGNOSIS — I4891 Unspecified atrial fibrillation: Secondary | ICD-10-CM | POA: Diagnosis not present

## 2012-12-12 DIAGNOSIS — R5383 Other fatigue: Secondary | ICD-10-CM | POA: Diagnosis not present

## 2012-12-12 DIAGNOSIS — M159 Polyosteoarthritis, unspecified: Secondary | ICD-10-CM | POA: Diagnosis not present

## 2012-12-12 DIAGNOSIS — I4891 Unspecified atrial fibrillation: Secondary | ICD-10-CM | POA: Diagnosis not present

## 2013-04-20 DIAGNOSIS — I4891 Unspecified atrial fibrillation: Secondary | ICD-10-CM | POA: Diagnosis not present

## 2013-04-20 DIAGNOSIS — M159 Polyosteoarthritis, unspecified: Secondary | ICD-10-CM | POA: Diagnosis not present

## 2013-04-20 DIAGNOSIS — R609 Edema, unspecified: Secondary | ICD-10-CM | POA: Diagnosis not present

## 2014-01-21 ENCOUNTER — Emergency Department (HOSPITAL_COMMUNITY)
Admission: EM | Admit: 2014-01-21 | Discharge: 2014-01-21 | Disposition: A | Discharge status: Home / Self Care | Payer: Medicare Other | Attending: Emergency Medicine | Admitting: Emergency Medicine

## 2014-01-21 ENCOUNTER — Encounter (HOSPITAL_COMMUNITY): Payer: Self-pay | Admitting: Emergency Medicine

## 2014-01-21 ENCOUNTER — Emergency Department (HOSPITAL_COMMUNITY): Payer: Medicare Other

## 2014-01-21 DIAGNOSIS — S0990XA Unspecified injury of head, initial encounter: Secondary | ICD-10-CM | POA: Diagnosis not present

## 2014-01-21 DIAGNOSIS — S335XXA Sprain of ligaments of lumbar spine, initial encounter: Secondary | ICD-10-CM | POA: Diagnosis not present

## 2014-01-21 DIAGNOSIS — T148XXA Other injury of unspecified body region, initial encounter: Secondary | ICD-10-CM | POA: Diagnosis not present

## 2014-01-21 DIAGNOSIS — M546 Pain in thoracic spine: Secondary | ICD-10-CM | POA: Diagnosis not present

## 2014-01-21 DIAGNOSIS — M545 Low back pain, unspecified: Secondary | ICD-10-CM | POA: Diagnosis not present

## 2014-01-21 DIAGNOSIS — Y92410 Unspecified street and highway as the place of occurrence of the external cause: Secondary | ICD-10-CM | POA: Insufficient documentation

## 2014-01-21 DIAGNOSIS — Z8709 Personal history of other diseases of the respiratory system: Secondary | ICD-10-CM | POA: Insufficient documentation

## 2014-01-21 DIAGNOSIS — S0993XA Unspecified injury of face, initial encounter: Secondary | ICD-10-CM | POA: Diagnosis not present

## 2014-01-21 DIAGNOSIS — S24101A Unspecified injury at T1 level of thoracic spinal cord, initial encounter: Secondary | ICD-10-CM | POA: Insufficient documentation

## 2014-01-21 DIAGNOSIS — M549 Dorsalgia, unspecified: Secondary | ICD-10-CM | POA: Diagnosis not present

## 2014-01-21 DIAGNOSIS — IMO0002 Reserved for concepts with insufficient information to code with codable children: Secondary | ICD-10-CM | POA: Insufficient documentation

## 2014-01-21 DIAGNOSIS — R51 Headache: Secondary | ICD-10-CM | POA: Diagnosis not present

## 2014-01-21 DIAGNOSIS — S199XXA Unspecified injury of neck, initial encounter: Secondary | ICD-10-CM | POA: Diagnosis not present

## 2014-01-21 DIAGNOSIS — Y9301 Activity, walking, marching and hiking: Secondary | ICD-10-CM | POA: Insufficient documentation

## 2014-01-21 DIAGNOSIS — S39012A Strain of muscle, fascia and tendon of lower back, initial encounter: Secondary | ICD-10-CM

## 2014-01-21 DIAGNOSIS — S339XXA Sprain of unspecified parts of lumbar spine and pelvis, initial encounter: Secondary | ICD-10-CM | POA: Insufficient documentation

## 2014-01-21 MED ORDER — OXYCODONE-ACETAMINOPHEN 5-325 MG PO TABS: 2.0000 | ORAL_TABLET | ORAL | Status: DC | PRN

## 2014-01-21 MED ORDER — ONDANSETRON 8 MG PO TBDP: 8.0000 mg | ORAL_TABLET | Freq: Three times a day (TID) | ORAL | Status: DC | PRN

## 2014-01-21 MED ORDER — HYDROMORPHONE HCL PF 1 MG/ML IJ SOLN
1.0000 mg | Freq: Once | INTRAMUSCULAR | Status: AC
  Administered 2014-01-21: 1 mg via INTRAMUSCULAR
  Filled 2014-01-21: qty 1
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
## 2014-01-21 NOTE — Discharge Instructions (Signed)

## 2014-01-21 NOTE — ED Notes (Signed)
Patient transported to X-ray 

## 2014-01-21 NOTE — ED Notes (Signed)
Pt discharged home with all belongings, pt alert, oriented, and ambulatory upon discharge, pt escorted to waiting area via wheel chair by this RN, 2 new RX prescribed, pt verbalizes understanding of discharge instructions

## 2014-01-21 NOTE — ED Notes (Addendum)
Patient transported to CT 
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
## 2014-03-08 ENCOUNTER — Ambulatory Visit: Payer: Medicare Other | Admitting: Family Medicine

## 2014-03-12 ENCOUNTER — Encounter (HOSPITAL_COMMUNITY): Payer: Self-pay | Admitting: Emergency Medicine

## 2014-03-12 ENCOUNTER — Emergency Department (HOSPITAL_COMMUNITY)
Admission: EM | Admit: 2014-03-12 | Discharge: 2014-03-12 | Disposition: A | Discharge status: Home / Self Care | Payer: Medicare Other | Attending: Emergency Medicine | Admitting: Emergency Medicine

## 2014-03-12 DIAGNOSIS — M545 Low back pain, unspecified: Secondary | ICD-10-CM | POA: Insufficient documentation

## 2014-03-12 DIAGNOSIS — G8911 Acute pain due to trauma: Secondary | ICD-10-CM | POA: Diagnosis not present

## 2014-03-12 DIAGNOSIS — R600 Localized edema: Secondary | ICD-10-CM

## 2014-03-12 DIAGNOSIS — Z8709 Personal history of other diseases of the respiratory system: Secondary | ICD-10-CM | POA: Diagnosis not present

## 2014-03-12 DIAGNOSIS — L03116 Cellulitis of left lower limb: Secondary | ICD-10-CM

## 2014-03-12 DIAGNOSIS — I809 Phlebitis and thrombophlebitis of unspecified site: Secondary | ICD-10-CM

## 2014-03-12 DIAGNOSIS — R609 Edema, unspecified: Secondary | ICD-10-CM | POA: Insufficient documentation

## 2014-03-12 DIAGNOSIS — M7989 Other specified soft tissue disorders: Secondary | ICD-10-CM

## 2014-03-12 LAB — BASIC METABOLIC PANEL
BUN: 17 mg/dL (ref 6–23)
CO2: 24 mEq/L (ref 19–32)
Calcium: 8.6 mg/dL (ref 8.4–10.5)
Chloride: 105 mEq/L (ref 96–112)
Creatinine, Ser: 1.18 mg/dL (ref 0.50–1.35)
GFR calc Af Amer: 70 mL/min — ABNORMAL LOW (ref >90)
GFR calc non Af Amer: 60 mL/min — ABNORMAL LOW (ref >90)
Glucose, Bld: 100 mg/dL — ABNORMAL HIGH (ref 70–99)
Potassium: 4.4 mEq/L (ref 3.7–5.3)
Sodium: 140 mEq/L (ref 137–147)

## 2014-03-12 MED ORDER — POTASSIUM CHLORIDE CRYS ER 20 MEQ PO TBCR
20.0000 meq | EXTENDED_RELEASE_TABLET | Freq: Once | ORAL | Status: AC
  Administered 2014-03-12: 20 meq via ORAL
  Filled 2014-03-12: qty 1

## 2014-03-12 MED ORDER — POTASSIUM CHLORIDE CRYS ER 20 MEQ PO TBCR: 20.0000 meq | EXTENDED_RELEASE_TABLET | Freq: Two times a day (BID) | ORAL | Status: DC

## 2014-03-12 MED ORDER — FUROSEMIDE 20 MG PO TABS: 20.0000 mg | ORAL_TABLET | Freq: Two times a day (BID) | ORAL | Status: DC

## 2014-03-12 MED ORDER — FUROSEMIDE 20 MG PO TABS
40.0000 mg | ORAL_TABLET | Freq: Once | ORAL | Status: AC
  Administered 2014-03-12: 40 mg via ORAL
  Filled 2014-03-12: qty 2

## 2014-03-12 NOTE — ED Notes (Signed)
Per pt sts a week and half of BLE swelling. sts also some right knee pain and calf pain.

## 2014-03-12 NOTE — ED Notes (Signed)
Pt comfortable with discharge and follow up instructions. Prescriptions x2. 
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
## 2014-03-12 NOTE — ED Notes (Signed)
Pt transported for Venous Duplex

## 2014-03-12 NOTE — Discharge Instructions (Signed)
Peripheral Edema °You have swelling in your legs (peripheral edema). This swelling is due to excess accumulation of salt and water in your body. Edema may be a sign of heart, kidney or liver disease, or a side effect of a medication. It may also be due to problems in the leg veins. Elevating your legs and using special support stockings may be very helpful, if the cause of the swelling is due to poor venous circulation. Avoid long periods of standing, whatever the cause. °Treatment of edema depends on identifying the cause. Chips, pretzels, pickles and other salty foods should be avoided. Restricting salt in your diet is almost always needed. Water pills (diuretics) are often used to remove the excess salt and water from your body via urine. These medicines prevent the kidney from reabsorbing sodium. This increases urine flow. °Diuretic treatment may also result in lowering of potassium levels in your body. Potassium supplements may be needed if you have to use diuretics daily. Daily weights can help you keep track of your progress in clearing your edema. You should call your caregiver for follow up care as recommended. °SEEK IMMEDIATE MEDICAL CARE IF:  °· You have increased swelling, pain, redness, or heat in your legs. °· You develop shortness of breath, especially when lying down. °· You develop chest or abdominal pain, weakness, or fainting. °· You have a fever. °Document Released: 11/15/2004 Document Revised: 12/31/2011 Document Reviewed: 10/26/2009 °ExitCare® Patient Information ©2014 ExitCare, LLC. ° ° ° °Emergency Department Resource Guide °1) Find a Doctor and Pay Out of Pocket °Although you won't have to find out who is covered by your insurance plan, it is a good idea to ask around and get recommendations. You will then need to call the office and see if the doctor you have chosen will accept you as a new patient and what types of options they offer for patients who are self-pay. Some doctors offer  discounts or will set up payment plans for their patients who do not have insurance, but you will need to ask so you aren't surprised when you get to your appointment. ° °2) Contact Your Local Health Department °Not all health departments have doctors that can see patients for sick visits, but many do, so it is worth a call to see if yours does. If you don't know where your local health department is, you can check in your phone book. The CDC also has a tool to help you locate your state's health department, and many state websites also have listings of all of their local health departments. ° °3) Find a Walk-in Clinic °If your illness is not likely to be very severe or complicated, you may want to try a walk in clinic. These are popping up all over the country in pharmacies, drugstores, and shopping centers. They're usually staffed by nurse practitioners or physician assistants that have been trained to treat common illnesses and complaints. They're usually fairly quick and inexpensive. However, if you have serious medical issues or chronic medical problems, these are probably not your best option. ° °No Primary Care Doctor: °- Call [facility] at  832-8000 - they can help you locate a primary care doctor that  accepts your insurance, provides certain services, etc. °- Physician Referral Service- 1-800-533-3463 ° °Chronic Pain Problems: °Organization         Address  Phone   Notes  °[facility]  (336) 297-2271 Patients need to be referred by their primary care doctor.  ° °  Medication Assistance: °Organization         Address  Phone   Notes  °[place] [facility] [address] °[place], [place] [address] (336) 641-8030 --Must be a resident of [place] °-- Must have NO insurance coverage whatsoever (no Medicaid/ Medicare, etc.) °-- The pt. MUST have a primary care doctor that directs their care regularly and follows them in the community °  °[facility]   (866) 331-1348   °[employer]  (888) 892-1162   ° °Agencies that provide inexpensive medical care: °Organization         Address  Phone   Notes  °[facility]  (336) 832-8035   °[facility]    (336) 832-7272   °[facility] [address] °[place], [place] [address] (336) 832-4777   °[facility] [address], °[place] (336) 271-4999   °[facility]    (336) 373-0678   °[facility]    (336) 272-1050   °[facility] ° [address], [place] Phone:  (336) 832-4444, Fax:  (336) 832-4440 Hours of Operation:  9 am - 6 pm, M-F.  Also accepts Medicaid/Medicare and self-pay.  ° [facility] ° [address], [place] Phone: (336) 832-3150, Fax: (336) 832-3151. Hours of Operation:  8:30 am - 5:30 pm, M-F.  Also accepts Medicaid and self-pay.  °[facility] [place] [address], [place] Phone: (336) 878-6027   °[facility] [address], [place], [place] (336)723-1848, Ext. 123 Mondays & Thursdays: 7-9 AM.  First 15 patients are seen on a first come, first serve basis. °  ° °Medicaid-accepting [place] Providers: ° °Organization         Address  Phone   Notes  °[facility] [address], [place] (336) 641-2100 Also accepts self-pay patients.  °[facility] [address], [place] ° (336) 856-9996   °[facility] [address], [place] (336) 288-8857   °[facility] [address] [place] [address], [place] (336) 299-7000   °Veita Bland [address], [place]  ° (336) 373-1557 Only accepts [place] Access Medicaid patients after they have their name applied to their card.  ° °Self-Pay (no insurance) in [place]: ° °Organization         Address  Phone   Notes  °Sickle Cell Patients, [facility] [address], [place] (336) 832-1970   °[facility] [address], [place] (336) 832-4400   °[facility] ° [address], [place] (336) 992-4800   °[facility]/Dr. Osei-Bonsu ° [address] [place] [address], [place] or [address], [place] (336) 841-8500 Phone number for both [place] and [place] locations is the same.  °[facility] [address], [place] (336) 299-0000   °[facility] [address] [place] [address], [place] or [address] (336) 852-7530 °(336) 878-2260   °[facility] [address],  (336) 350-1642, phone; (336) 294-5005, fax Sees patients 1st and 3rd Saturday of every month.  Must not qualify for public or private insurance (i.e. Medicaid, Medicare, [place] Health Choice, Veterans' Benefits) • Household income should be no more than 200% of the poverty level •The   clinic cannot treat you if you are pregnant or think you are pregnant • Sexually transmitted diseases are not treated at the clinic.  ° ° °Dental Care: °Organization         Address  Phone  Notes  °[facility] [facility] [address], [place] (336) 641-6152 Accepts children up to age 21 who are enrolled in Medicaid or [place] Health Choice; pregnant women with a Medicaid card; and children who have applied for Medicaid or [place] Health Choice, but were declined, whose parents can pay a reduced fee at time of service.  °[facility] [place]  [address], [place] (336) 641-7733 Accepts children up to age 21 who are enrolled in Medicaid or [place] Health Choice; pregnant women with a Medicaid card; and children who have applied for Medicaid or [place] Health Choice, but were declined, whose parents can pay a reduced fee at time of service.  °[facility] ° [address], [place]  (336) 641-4533 Patients are seen by appointment only. Walk-ins are not accepted. [facility] will see patients 18 years of age and older. °Monday - Tuesday (8am-5pm) °Most Wednesdays (8:30-5pm) °$30 per visit, cash only  °[facility] ° [address], [place] (336) 641-4533 Patients are seen by appointment only. Walk-ins are not accepted. [facility] will see patients 18 years of age and older. °One Wednesday Evening (Monthly: Volunteer Based).  $30 per visit, cash only  °[facility]  (919) 537-3737 for adults; Children under age 4, call Graduate Pediatric Dentistry at ([phone]) [phone]. Children aged 4-14, please call (919) [phone] to request a pediatric application. ° Dental services are provided in all areas of dental care including fillings, crowns and bridges, complete and partial dentures, implants, gum treatment, root canals, and extractions. Preventive care is also provided. Treatment is provided to both adults and children. °Patients are selected via a lottery and there is often a waiting list. °  °[facility] [address], °[place] ° (336) 763-8833 [email or web address] °  °[facility] [address], [place], [place] (336)723-1848, Ext. 123 Second and Fourth Thursday of each month, opens at 6:30 AM; Clinic ends at 9 AM.  Patients are seen on a first-come first-served basis, and a limited number are seen during each clinic.  ° °[facility] ° [address], [place], [place] (336) 723-7904   Eligibility Requirements °You must have lived in [place], [place], or [place] counties for at least the last three months. °  You cannot be eligible for state or federal sponsored healthcare insurance, including Veterans Administration, Medicaid, or Medicare. °  You generally cannot be eligible for healthcare insurance through your employer.  °  How to apply: °Eligibility screenings are held every Tuesday and Wednesday  afternoon from 1:00 pm until 4:00 pm. You do not need an appointment for the interview!  °[facility] [address], [place], [place] 336-631-2330   °[facility]  336-342-8273   °[facility]  336-703-3100   ° [facility]  336-570-6415   ° °Behavioral Health Resources in the Community: °Intensive Outpatient Programs °Organization         Address  Phone  Notes  °[facility] [address], [place], [place] 336-878-6098   °[facility] [address], ,   [place] 336-832-9800   °[facility] [address], [place], [place] ° 336-882-2125   °[facility] [address],  °[place], [place] 1-800-853-5163 or 336-641-4981   °Substance Abuse Resources °Organization         Address  Phone  Notes  °[facility]  336-882-2125   °[facility]  336-784-9470   °[facility]  336-285-9073   °[facility]  336-845-3988   °Residential & Outpatient Substance Abuse Program  1-800-659-3381   °Psychological Services °Organization         Address  Phone  Notes  °[facility]  336- 832-9600   °[facility]  336- 378-7881   °[facility] [address], [place] 1-800-853-5163 or 336-641-4981   ° °Mobile Crisis Teams °Organization         Address  Phone  Notes  °[facility], Mobile Crisis Care Unit  1-877-626-1772   °Assertive °[facility] ° [address] [place], [place] 336-834-9664   °Sharon DeEsch [address] °[place] 336-554-5454   ° °Self-Help/Support Groups °Organization         Address  Phone             Notes  °[facility] - variety of support groups  336- 373-1402 Call for more information  °Narcotics Anonymous (NA), [facility] [address], °[place]  2 meetings at this location  ° °Residential Treatment  Programs °Organization         Address  Phone  Notes  °[facility] [address],    °[place]  1-866-801-8205   °[facility] ° [address], [place], [place] 704-293-8524   °[facility] [address], [place] 336-845-3988 Admissions: 8am-3pm M-F  °[facility] [address]    °[place], [place] 336-841-1104   °[facility] [address], [place], [place] 336-379-7146   °[facility] [address]  °[place], [place] 336-285-9073   °[facility] - Intensive Outpatient [address], [place], [place] 336-852-3033   °[facility] [address]  °[place], [place] 1-877-615-2722 or 336-784-9470   °[facility] [address] [place], [place] 336-227-7417 Accepts Medicaid  °[facility] [address]  °[place] 1-800-659-3381 Substance Abuse/Addiction Treatment  ° °[place] Behavioral Health Resources °Organization         Address  Phone  Notes  °[facility]  (888) 581-9988   °Julie Brannon, PhD [address] [place], [place]   (336) 349-5553 or (336) 951-0000   °[facility]   [address] °[place], [place] (336) 349-4454   °[facility] [address], [place], [place] (336) 342-8316 Insurance/Medicaid/sponsorship through [facility]  °[facility] [address]                                    [place], [place] (336) 342-8316 Therapy/tele-psych/case  °[facility] [address]  ° [place], [place] (336) 349-2233    °Dr. Arfeen  (336) 349-4544   °[facility] [place]  [employer] [place] [facility] 1) [address], [place] °2) [address], [place] °3)  [address], [place] (336) 349-3220 °(336) 342-7768 ° °(  336) 342-8140   °[place] [facility] (336) 342-1394 or (336) 342-3537 (After Hours)    ° ° ° °

## 2014-03-12 NOTE — Progress Notes (Signed)
VASCULAR LAB PRELIMINARY  PRELIMINARY  PRELIMINARY  PRELIMINARY  Bilateral lower extremity venous duplex completed.    Preliminary report:  Bilateral:  No evidence of DVT, superficial thrombosis, or Baker's Cyst.   Nolberto Hanlon Loyola Santino, RVS 03/12/2014, 2:11 PM

## 2014-03-25 ENCOUNTER — Encounter: Payer: Self-pay | Admitting: Internal Medicine

## 2014-03-25 ENCOUNTER — Ambulatory Visit: Payer: Medicare Other | Attending: Internal Medicine | Admitting: Internal Medicine

## 2014-03-25 VITALS — BP 125/81 | HR 74 | Temp 98.4°F | Resp 18 | Ht 62.0 in | Wt 188.0 lb

## 2014-03-25 DIAGNOSIS — K4090 Unilateral inguinal hernia, without obstruction or gangrene, not specified as recurrent: Secondary | ICD-10-CM | POA: Diagnosis not present

## 2014-03-25 DIAGNOSIS — I878 Other specified disorders of veins: Secondary | ICD-10-CM | POA: Insufficient documentation

## 2014-03-25 DIAGNOSIS — R609 Edema, unspecified: Secondary | ICD-10-CM | POA: Insufficient documentation

## 2014-03-25 DIAGNOSIS — I872 Venous insufficiency (chronic) (peripheral): Secondary | ICD-10-CM | POA: Diagnosis not present

## 2014-03-25 DIAGNOSIS — Z0000 Encounter for general adult medical examination without abnormal findings: Secondary | ICD-10-CM | POA: Diagnosis not present

## 2014-03-25 DIAGNOSIS — R600 Localized edema: Secondary | ICD-10-CM | POA: Insufficient documentation

## 2014-03-25 LAB — BASIC METABOLIC PANEL
BUN: 19 mg/dL (ref 6–23)
CHLORIDE: 107 meq/L (ref 96–112)
CO2: 26 mEq/L (ref 19–32)
Calcium: 8.8 mg/dL (ref 8.4–10.5)
Creat: 1.13 mg/dL (ref 0.50–1.35)
Glucose, Bld: 98 mg/dL (ref 70–99)
POTASSIUM: 4.1 meq/L (ref 3.5–5.3)
SODIUM: 140 meq/L (ref 135–145)

## 2014-03-25 LAB — HEPATIC FUNCTION PANEL
ALT: 19 U/L (ref 0–53)
AST: 21 U/L (ref 0–37)
Albumin: 3.6 g/dL (ref 3.5–5.2)
Alkaline Phosphatase: 103 U/L (ref 39–117)
BILIRUBIN INDIRECT: 0.7 mg/dL (ref 0.2–1.2)
Bilirubin, Direct: 0.1 mg/dL (ref 0.0–0.3)
TOTAL PROTEIN: 6 g/dL (ref 6.0–8.3)
Total Bilirubin: 0.8 mg/dL (ref 0.2–1.2)

## 2014-03-25 MED ORDER — FUROSEMIDE 20 MG PO TABS: 20.0000 mg | ORAL_TABLET | Freq: Two times a day (BID) | ORAL | Status: DC

## 2014-03-25 MED ORDER — POTASSIUM CHLORIDE CRYS ER 20 MEQ PO TBCR: 20.0000 meq | EXTENDED_RELEASE_TABLET | Freq: Two times a day (BID) | ORAL | Status: DC

## 2014-03-25 NOTE — Patient Instructions (Signed)
Hernia A hernia happens when an organ inside your body pushes out through a weak spot in your belly (abdominal) wall. Most hernias get worse over time. They can often be pushed back into place (reduced). Surgery may be needed to repair hernias that cannot be pushed into place. HOME CARE  Keep doing normal activities.  Avoid lifting more than 10 pounds (4.5 kilograms).  Cough gently and avoid straining. Over time, these things will:  Increase your hernia size.  Irritate your hernia.  Break down hernia repairs.  Stop smoking.  Do not wear anything tight over your hernia. Do not keep the hernia in with an outside bandage.  Eat food that is high in fiber (fruit, vegetables, whole grains).  Drink enough fluids to keep your pee (urine) clear or pale yellow.  Take medicines to make your poop soft (stool softeners) if you cannot poop (constipated). GET HELP RIGHT AWAY IF:   You have a fever.  You have belly pain that gets worse.  You feel sick to your stomach (nauseous) and throw up (vomit).  Your skin starts to bulge out.  Your hernia turns a different color, feels hard, or is tender.  You have increased pain or puffiness (swelling) around the hernia.  You poop more or less often.  Your poop does not look the way normally does.  You have watery poop (diarrhea).  You cannot push the hernia back in place by applying gentle pressure while lying down. MAKE SURE YOU:   Understand these instructions.  Will watch your condition.  Will get help right away if you are not doing well or get worse. Document Released: 03/28/2010 Document Revised: 12/31/2011 Document Reviewed: 03/28/2010 [facility] Patient Information 2014 [place]Care, [place]. Venous Stasis or Chronic Venous Insufficiency Chronic venous insufficiency, also called venous stasis, is a condition that affects the veins in the legs. The condition prevents blood from being pumped through these veins effectively. Blood may no  longer be pumped effectively from the legs back to the heart. This condition can range from mild to severe. With proper treatment, you should be able to continue with an active life. CAUSES  Chronic venous insufficiency occurs when the vein walls become stretched, weakened, or damaged or when valves within the vein are damaged. Some common causes of this include:  High blood pressure inside the veins (venous hypertension).  Increased blood pressure in the leg veins from long periods of sitting or standing.  A blood clot that blocks blood flow in a vein (deep vein thrombosis).  Inflammation of a superficial vein (phlebitis) that causes a blood clot to form. RISK FACTORS Various things can make you more likely to develop chronic venous insufficiency, including:  Family history of this condition.  Obesity.  Pregnancy.  Sedentary lifestyle.  Smoking.  Jobs requiring long periods of standing or sitting in one place.  Being a certain age. Women in their 3740s and 7050s and men in their 7270s are more likely to develop this condition. SIGNS AND SYMPTOMS  Symptoms may include:   Varicose veins.  Skin breakdown or ulcers.  Reddened or discolored skin on the leg.  Brown, smooth, tight, and painful skin just above the ankle, usually on the inside surface (lipodermatosclerosis).  Swelling. DIAGNOSIS  To diagnose this condition, your health care provider will take a medical history and do a physical exam. The following tests may be ordered to confirm the diagnosis:  Duplex ultrasound A procedure that produces a picture of a blood vessel and nearby organs  and also provides information on blood flow through the blood vessel.  Plethysmography A procedure that tests blood flow.  A venogram, or venography A procedure used to look at the veins using X-ray and dye. TREATMENT The goals of treatment are to help you return to an active life and to minimize pain or disability. Treatment will depend  on the severity of the condition. Medical procedures may be needed for severe cases. Treatment options may include:   Use of compression stockings. These can help with symptoms and lower the chances of the problem getting worse, but they do not cure the problem.  Sclerotherapy A procedure involving an injection of a material that "dissolves" the damaged veins. Other veins in the network of blood vessels take over the function of the damaged veins.  Surgery to remove the vein or cut off blood flow through the vein (vein stripping or laser ablation surgery).  Surgery to repair a valve. HOME CARE INSTRUCTIONS   Wear compression stockings as directed by your health care provider.  Only take over-the-counter or prescription medicines for pain, discomfort, or fever as directed by your health care provider.  Follow up with your health care provider as directed. SEEK MEDICAL CARE IF:   You have redness, swelling, or increasing pain in the affected area.  You see a red streak or line that extends up or down from the affected area.  You have a breakdown or loss of skin in the affected area, even if the breakdown is small.  You have an injury to the affected area. SEEK IMMEDIATE MEDICAL CARE IF:   You have an injury and open wound in the affected area.  Your pain is severe and does not improve with medicine.  You have sudden numbness or weakness in the foot or ankle below the affected area, or you have trouble moving your foot or ankle.  You have a fever or persistent symptoms for more than 2 3 days.  You have a fever and your symptoms suddenly get worse. MAKE SURE YOU:   Understand these instructions.  Will watch your condition.  Will get help right away if you are not doing well or get worse. Document Released: 02/11/2007 Document Revised: 07/29/2013 Document Reviewed: 06/15/2013 [facility] Patient Information 2014 [place], [place].
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
## 2014-03-25 NOTE — Progress Notes (Signed)
Pt here to establish care for left leg cellulitis with phlebitis +3 edema noted to both feet with radiation to ankles C/o burning sensation States he was prescribed Lasix and Potassium in ER until seen by provider [facility]. Compression socks prescribed in ER

## 2014-03-26 LAB — MICROALBUMIN, URINE: Microalb, Ur: 0.5 mg/dL (ref 0.00–1.89)

## 2014-03-30 ENCOUNTER — Telehealth: Payer: Self-pay | Admitting: Emergency Medicine

## 2014-03-30 NOTE — Telephone Encounter (Signed)
Message copied by Darlis Loan on Tue Mar 30, 2014  5:10 PM ------      Message from: Quentin Angst      Created: Fri Mar 26, 2014  5:19 PM       Please inform patient that his laboratory tests results are all within normal limit ------

## 2014-03-30 NOTE — Telephone Encounter (Signed)
Attempted to reach pt but number listed invalid. No other number listed. Will try later

## 2014-04-01 ENCOUNTER — Ambulatory Visit (HOSPITAL_COMMUNITY)
Admission: RE | Admit: 2014-04-01 | Discharge: 2014-04-01 | Disposition: A | Discharge status: Home / Self Care | Payer: Medicare Other | Source: Ambulatory Visit | Attending: Internal Medicine | Admitting: Internal Medicine

## 2014-04-01 DIAGNOSIS — K4090 Unilateral inguinal hernia, without obstruction or gangrene, not specified as recurrent: Secondary | ICD-10-CM

## 2014-04-01 DIAGNOSIS — N5089 Other specified disorders of the male genital organs: Secondary | ICD-10-CM | POA: Diagnosis not present

## 2014-04-01 DIAGNOSIS — N433 Hydrocele, unspecified: Secondary | ICD-10-CM | POA: Insufficient documentation

## 2014-04-02 ENCOUNTER — Telehealth: Payer: Self-pay | Admitting: Emergency Medicine

## 2014-04-02 NOTE — Telephone Encounter (Signed)
Left message for pt to call for lab results 

## 2014-04-02 NOTE — Telephone Encounter (Signed)
Message copied by Darlis LoanSMITH, Iley Breeden D on Fri Apr 02, 2014  5:09 PM ------      Message from: Quentin AngstJEGEDE, OLUGBEMIGA E      Created: Fri Apr 02, 2014 10:18 AM       Please inform patient that his scrotal ultrasound supports, diagnosis of right inguinal hernia. We advised that he keeps the appointment with surgery ------

## 2014-04-06 ENCOUNTER — Telehealth: Payer: Self-pay | Admitting: Emergency Medicine

## 2014-04-06 NOTE — Telephone Encounter (Signed)
Pt called in requesting u/s report. Instructed pt to keep scheduled Gen surgery appt as planned per Dr. Hyman HopesJegede. Pt verbalized understanding

## 2014-04-07 ENCOUNTER — Ambulatory Visit (HOSPITAL_COMMUNITY)
Admission: RE | Admit: 2014-04-07 | Discharge: 2014-04-07 | Disposition: A | Discharge status: Home / Self Care | Payer: Medicare Other | Source: Ambulatory Visit | Attending: Internal Medicine | Admitting: Internal Medicine

## 2014-04-07 DIAGNOSIS — R600 Localized edema: Secondary | ICD-10-CM

## 2014-04-07 DIAGNOSIS — I509 Heart failure, unspecified: Secondary | ICD-10-CM | POA: Diagnosis not present

## 2014-04-07 NOTE — Progress Notes (Signed)
  Echocardiogram 2D Echocardiogram has been performed.  Sean Ward, Sean Ward 04/07/2014, 2:52 PM

## 2014-04-08 ENCOUNTER — Encounter (INDEPENDENT_AMBULATORY_CARE_PROVIDER_SITE_OTHER): Payer: Self-pay | Admitting: Surgery

## 2014-04-08 ENCOUNTER — Ambulatory Visit (INDEPENDENT_AMBULATORY_CARE_PROVIDER_SITE_OTHER): Payer: Medicare Other | Admitting: Surgery

## 2014-04-08 VITALS — BP 130/76 | HR 77 | Temp 97.7°F | Ht 68.0 in | Wt 181.0 lb

## 2014-04-08 DIAGNOSIS — G252 Other specified forms of tremor: Secondary | ICD-10-CM

## 2014-04-08 DIAGNOSIS — K4090 Unilateral inguinal hernia, without obstruction or gangrene, not specified as recurrent: Secondary | ICD-10-CM

## 2014-04-08 DIAGNOSIS — R251 Tremor, unspecified: Secondary | ICD-10-CM | POA: Insufficient documentation

## 2014-04-08 DIAGNOSIS — R259 Unspecified abnormal involuntary movements: Secondary | ICD-10-CM

## 2014-04-08 HISTORY — DX: Unilateral inguinal hernia, without obstruction or gangrene, not specified as recurrent: K40.90

## 2014-04-08 NOTE — Patient Instructions (Signed)
Please consider the recommendations that we have given you today:  Consider laparoscopic surgery to repair the hernias in your left and probable right groins.  See the Handout(s) we have given you.  Please call our office at [phone] if you wish to schedule surgery or if you have further questions / concerns.   Hernia A hernia occurs when an internal organ pushes out through a weak spot in the abdominal wall. Hernias most commonly occur in the groin and around the navel. Hernias often can be pushed back into place (reduced). Most hernias tend to get worse over time. Some abdominal hernias can get stuck in the opening (irreducible or incarcerated hernia) and cannot be reduced. An irreducible abdominal hernia which is tightly squeezed into the opening is at risk for impaired blood supply (strangulated hernia). A strangulated hernia is a medical emergency. Because of the risk for an irreducible or strangulated hernia, surgery may be recommended to repair a hernia. CAUSES   Heavy lifting.  Prolonged coughing.  Straining to have a bowel movement.  A cut (incision) made during an abdominal surgery. HOME CARE INSTRUCTIONS   Bed rest is not required. You may continue your normal activities.  Avoid lifting more than 10 pounds (4.5 kg) or straining.  Cough gently. If you are a smoker it is best to stop. Even the best hernia repair can break down with the continual strain of coughing. Even if you do not have your hernia repaired, a cough will continue to aggravate the problem.  Do not wear anything tight over your hernia. Do not try to keep it in with an outside bandage or truss. These can damage abdominal contents if they are trapped within the hernia sac.  Eat a normal diet.  Avoid constipation. Straining over long periods of time will increase hernia size and encourage breakdown of repairs. If you cannot do this with diet alone, stool softeners may be used. SEEK IMMEDIATE MEDICAL CARE  IF:   You have a fever.  You develop increasing abdominal pain.  You feel nauseous or vomit.  Your hernia is stuck outside the abdomen, looks discolored, feels hard, or is tender.  You have any changes in your bowel habits or in the hernia that are unusual for you.  You have increased pain or swelling around the hernia.  You cannot push the hernia back in place by applying gentle pressure while lying down. MAKE SURE YOU:   Understand these instructions.  Will watch your condition.  Will get help right away if you are not doing well or get worse. Document Released: 10/08/2005 Document Revised: 12/31/2011 Document Reviewed: 05/27/2008 [facility] Patient Information 2015 [employer] [place]. This information is not intended to replace advice given to you by your health care Jannett Schmall. Make sure you discuss any questions you have with your health care Alexica Schlossberg.  HERNIA REPAIR: POST OP INSTRUCTIONS  1. DIET: Follow a light bland diet the first 24 hours after arrival home, such as soup, liquids, crackers, etc.  Be sure to include lots of fluids daily.  Avoid fast food or heavy meals as your are more likely to get nauseated.  Eat a low fat the next few days after surgery. 2. Take your usually prescribed home medications unless otherwise directed. 3. PAIN CONTROL: a. Pain is best controlled by a usual combination of three different methods TOGETHER: i. Ice/Heat ii. Over the counter pain medication iii. Prescription pain medication b. Most patients will experience some swelling and bruising around the hernia(s) such  as the bellybutton, groins, or old incisions.  Ice packs or heating pads (30-60 minutes up to 6 times a day) will help. Use ice for the first few days to help decrease swelling and bruising, then switch to heat to help relax tight/sore spots and speed recovery.  Some people prefer to use ice alone, heat alone, alternating between ice & heat.  Experiment to what works for you.  Swelling  and bruising can take several weeks to resolve.   c. It is helpful to take an over-the-counter pain medication regularly for the first few weeks.  Choose one of the following that works best for you: i. Naproxen (Aleve, etc)  Two 220mg  tabs twice a day ii. Ibuprofen (Advil, etc) Three 200mg  tabs four times a day (every meal & bedtime) iii. Acetaminophen (Tylenol, etc) 325-650mg  four times a day (every meal & bedtime) d. A  prescription for pain medication should be given to you upon discharge.  Take your pain medication as prescribed.  i. If you are having problems/concerns with the prescription medicine (does not control pain, nausea, vomiting, rash, itching, etc), please call us [phone] to see if we need to switch you to a different pain medicine that will work better for you and/or control your side effect better. ii. If you need a refill on your pain medication, please contact your pharmacy.  They will contact our office to request authorization. Prescriptions will not be filled after 5 pm or on week-ends. 4. Avoid getting constipated.  Between the surgery and the pain medications, it is common to experience some constipation.  Increasing fluid intake and taking a fiber supplement (such as Metamucil, Citrucel, FiberCon, MiraLax, etc) 1-2 times a day regularly will usually help prevent this problem from occurring.  A mild laxative (prune juice, Milk of Magnesia, MiraLax, etc) should be taken according to package directions if there are no bowel movements after 48 hours.   5. Wash / shower every day.  You may shower over the dressings as they are waterproof.   6. Remove your waterproof bandages 5 days after surgery.  You may leave the incision open to air.  You may replace a dressing/Band-Aid to cover the incision for comfort if you wish.  Continue to shower over incision(s) after the dressing is off.    7. ACTIVITIES as tolerated:   a. You may resume regular (light) daily activities  beginning the next day-such as daily self-care, walking, climbing stairs-gradually increasing activities as tolerated.  If you can walk 30 minutes without difficulty, it is safe to try more intense activity such as jogging, treadmill, bicycling, low-impact aerobics, swimming, etc. b. Save the most intensive and strenuous activity for last such as sit-ups, heavy lifting, contact sports, etc  Refrain from any heavy lifting or straining until you are off narcotics for pain control.   c. DO NOT PUSH THROUGH PAIN.  Let pain be your guide: If it hurts to do something, don't do it.  Pain is your body warning you to avoid that activity for another week until the pain goes down. d. You may drive when you are no longer taking prescription pain medication, you can comfortably wear a seatbelt, and you can safely maneuver your car and apply brakes. e. Bonita Quin may have sexual intercourse when it is comfortable.  8. FOLLOW UP in our office a. Please call [facility] at [phone] to set up an appointment to see your surgeon in the office for a follow-up appointment approximately 2-3 weeks  after your surgery. b. Make sure that you call for this appointment the day you arrive home to insure a convenient appointment time. 9.  IF YOU HAVE DISABILITY OR FAMILY LEAVE FORMS, BRING THEM TO THE OFFICE FOR PROCESSING.  DO NOT GIVE THEM TO YOUR DOCTOR.  WHEN TO CALL US [phone]) [phone]: 1. Poor pain control 2. Reactions / problems with new medications (rash/itching, nausea, etc)  3. Fever over 101.5 F (38.5 C) 4. Inability to urinate 5. Nausea and/or vomiting 6. Worsening swelling or bruising 7. Continued bleeding from incision. 8. Increased pain, redness, or drainage from the incision   The clinic staff is available to answer your questions during regular business hours (8:30am-5pm).  Please don't hesitate to call and ask to speak to one of our nurses for clinical concerns.   If you have a medical emergency, go to the nearest  emergency room or call 911.  A surgeon from [facility] is always on call at the hospitals in [place] [facility]  [facility] [place] [address], [place], [place]  [id number] ?  [address] [address], [place], [place]   [id number] MAIN: [phone] ? TOLL FREE: [phone] ? FAX: [phone] [email or web address]

## 2014-04-08 NOTE — Progress Notes (Signed)
Subjective:     Patient ID: Sean MilletVictor Justiss, male   DOB: 04-13-1941, 73 y.o.   MRN: [id number]  HPI  Note: Portions of this report may have been transcribed using voice recognition software. Every effort was made to ensure accuracy; however, inadvertent computerized transcription errors may be present.   Any transcriptional errors that result from this process are unintentional.            Sean Ward  04-13-1941 [id number]  Patient Care Team: Doris Cheadleeepak Advani, MD as PCP - General (Internal Medicine) Jeanann Lewandowskylugbemiga Jegede, MD as Referring Physician (Internal Medicine)  This patient is a 73 y.o.male who presents today for surgical evaluation at the request of Dr. Hyman HopesJegede.   Reason for visit: [facility]  Pleasant active male that is noticed groin bulging on the left side for a while.  Increased more recently.  He plays tennis.  He hikes.  He thinks he may have had some surgery on that side when he was a child but cannot recall.  Normally has a bowel movement every day.  Quite independent.  Primary care physician noted scrotal mass.  Ultrasound concerning for hernia.  Surgical consultation requested.  Patient has a bowel movement every day.  No history of MRSA or skin infections.  No history of fall or trauma.  No difficulty with urinary retention or starting stream.  No history of prostate problems.  Patient Active Problem List   Diagnosis Date Noted  . Hernia, inguinal, left 03/25/2014  . Bilateral edema of lower extremity 03/25/2014  . Venous stasis 03/25/2014  . Left leg cellulitis 08/20/2012  . Superficial phlebitis 08/20/2012    Past Medical History  Diagnosis Date  . History of bronchitis     Past Surgical History  Procedure Laterality Date  . None    . Tonsillectomy      History   Social History  . Marital Status: Legally Separated    Spouse Name: N/A    Number of Children: N/A  . Years of Education: N/A   Occupational History  . Not on file.   Social History Main  Topics  . Smoking status: Never Smoker   . Smokeless tobacco: Never Used  . Alcohol Use: Yes     Comment: wine occasionally  . Drug Use: No  . Sexual Activity: No   Other Topics Concern  . Not on file   Social History Narrative  . No narrative on file    History reviewed. No pertinent family history.  Current Outpatient Prescriptions  Medication Sig Dispense Refill  . furosemide (LASIX) 20 MG tablet Take 1 tablet (20 mg total) by mouth 2 (two) times daily.  10 tablet  0  . ibuprofen (ADVIL,MOTRIN) 200 MG tablet Take 200-600 mg by mouth every 6 (six) hours as needed. pain      . Multiple Vitamin (MULTIVITAMIN) tablet Take 1 tablet by mouth daily.      . potassium chloride SA (K-DUR,KLOR-CON) 20 MEQ tablet Take 1 tablet (20 mEq total) by mouth 2 (two) times daily.  10 tablet  0   No current facility-administered medications for this visit.     No Known Allergies  BP 130/76  Pulse 77  Temp(Src) 97.7 F (36.5 C)  Ht 5\' 8"  (1.727 m)  Wt 181 lb (82.101 kg)  BMI 27.53 kg/m2  [place] Scrotum  04/01/2014   CLINICAL DATA:  Inguinal/scrotal hernia left side versus testicular swelling.  EXAM: ULTRASOUND OF SCROTUM  TECHNIQUE: Complete ultrasound examination of the testicles, epididymis, and  other scrotal structures was performed.  COMPARISON:  None.  FINDINGS: Right testicle  Measurements: 3.0 x 2.3 x 4.3 cm. No mass or microlithiasis visualized. Normal color flow.  Left testicle  Measurements: 2.2 x 2.9 x 3.8 cm. No mass or microlithiasis visualized. Normal color flow.  Right epididymis: 4.3 cm cyst versus spermatocele over the epididymal head. Normal vascularity.  Left epididymis: Normal in size and appearance. Normal vascularity.  Hydrocele:  Small bilateral hydroceles  Varicocele:  None visualized.  Evaluation of the inguinal regions bilaterally was performed. The right inguinal region is unremarkable without evidence of hernia. The left inguinal region demonstrates at least 1  normal-appearing loop of bowel within the inguinal canal.  IMPRESSION: Normal sonographic evaluation of the testicles. Small bilateral hydroceles. 4.3 mm right epididymal cyst.  Evidence of a left inguinal hernia containing a normal appearing loop of bowel.   Electronically Signed   By: Elberta Fortisaniel  Boyle M.D.   On: 04/01/2014 10:10     Review of Systems  Constitutional: Negative for fever, chills and diaphoresis.  HENT: Negative for ear discharge, facial swelling, mouth sores, nosebleeds, sore throat and trouble swallowing.   Eyes: Negative for photophobia, discharge and visual disturbance.  Respiratory: Negative for choking, chest tightness, shortness of breath and stridor.   Cardiovascular: Negative for chest pain and palpitations.  Gastrointestinal: Negative for nausea, vomiting, abdominal pain, diarrhea, constipation, blood in stool, abdominal distention, anal bleeding and rectal pain.  Endocrine: Negative for cold intolerance and heat intolerance.  Genitourinary: Negative for dysuria, urgency, difficulty urinating and testicular pain.  Musculoskeletal: Negative for arthralgias, back pain, gait problem and myalgias.  Skin: Negative for color change, pallor, rash and wound.  Allergic/Immunologic: Negative for environmental allergies and food allergies.  Neurological: Negative for dizziness, speech difficulty, weakness, numbness and headaches.  Hematological: Negative for adenopathy. Does not bruise/bleed easily.  Psychiatric/Behavioral: Negative for hallucinations, confusion and agitation.       Objective:   Physical Exam  Constitutional: He is oriented to person, place, and time. He appears well-developed and well-nourished. No distress.  HENT:  Head: Normocephalic.  Mouth/Throat: Oropharynx is clear and moist. No oropharyngeal exudate.  Eyes: Conjunctivae and EOM are normal. Pupils are equal, round, and reactive to light. No scleral icterus.  Neck: Normal range of motion. Neck supple.  No tracheal deviation present.  Cardiovascular: Normal rate, regular rhythm and intact distal pulses.   Pulmonary/Chest: Effort normal and breath sounds normal. No respiratory distress.  Abdominal: Soft. He exhibits no distension. There is no tenderness. A hernia is present. Hernia confirmed positive in the right inguinal area and confirmed positive in the left inguinal area.  Genitourinary: Testes normal and penis normal.    Uncircumcised.  Musculoskeletal: Normal range of motion. He exhibits no tenderness.  Lymphadenopathy:    He has no cervical adenopathy.       Right: No inguinal adenopathy present.       Left: No inguinal adenopathy present.  Neurological: He is alert and oriented to person, place, and time. He displays tremor. No cranial nerve deficit. He exhibits normal muscle tone. Coordination normal. GCS eye subscore is 4. GCS verbal subscore is 5. GCS motor subscore is 6.  Subtle left upper extremity resting tremor.  Seems to try and hide it.  Skin: Skin is warm and dry. No rash noted. He is not diaphoretic. No erythema. No pallor.  Psychiatric: He has a normal mood and affect. His behavior is normal. Judgment and thought content normal.  Assessment:     BIH.  Left scrotal      Plan:     I think the patient would benefit from surgery especially since the left most likely as bowel within it.  Reasonable to will start out laparoscopically.  I would plan a bilateral repair for better long-term repair for such a giant hernia & since very likely has a right inguinal hernia.  He is interested in proceeding.  I discussed with him:  The anatomy & physiology of the abdominal wall and pelvic floor was discussed.  The pathophysiology of hernias in the inguinal and pelvic region was discussed.  Natural history risks such as progressive enlargement, pain, incarceration & strangulation was discussed.   Contributors to complications such as smoking, obesity, diabetes, prior surgery, etc  were discussed.    I feel the risks of no intervention will lead to serious problems that outweigh the operative risks; therefore, I recommended surgery to reduce and repair the hernia.  I explained laparoscopic techniques with possible need for an open approach.  I noted usual use of mesh to patch and/or buttress hernia repair  Risks such as bleeding, infection, abscess, need for further treatment, heart attack, death, and other risks were discussed.  I noted a good likelihood this will help address the problem.   Goals of post-operative recovery were discussed as well.  Possibility that this will not correct all symptoms was explained.  I stressed the importance of low-impact activity, aggressive pain control, avoiding constipation, & not pushing through pain to minimize risk of post-operative chronic pain or injury. Possibility of reherniation was discussed.  We will work to minimize complications.     An educational handout further explaining the pathology & treatment options was given as well.  Questions were answered.  The patient expresses understanding & wishes to proceed with surgery.

## 2014-04-12 ENCOUNTER — Telehealth: Payer: Self-pay | Admitting: Emergency Medicine

## 2014-04-12 NOTE — Telephone Encounter (Signed)
Message copied by Darlis LoanSMITH, JILL D on Mon Apr 12, 2014  4:14 PM ------      Message from: Quentin AngstJEGEDE, OLUGBEMIGA E      Created: Sun Apr 11, 2014  4:43 PM       Please inform patient that his heart ultrasound is normal ------

## 2014-04-12 NOTE — Telephone Encounter (Signed)
Pt given negative u/s report. Pt scheduled f/u appt with Dr. Hyman HopesJegede as well

## 2014-04-22 ENCOUNTER — Ambulatory Visit: Payer: Medicare Other | Admitting: Internal Medicine

## 2014-04-22 ENCOUNTER — Other Ambulatory Visit: Payer: Self-pay | Admitting: *Deleted

## 2014-04-22 DIAGNOSIS — R600 Localized edema: Secondary | ICD-10-CM

## 2014-04-22 DIAGNOSIS — I878 Other specified disorders of veins: Secondary | ICD-10-CM

## 2014-05-03 ENCOUNTER — Ambulatory Visit: Payer: Medicare Other | Attending: Internal Medicine | Admitting: Internal Medicine

## 2014-05-03 ENCOUNTER — Encounter: Payer: Self-pay | Admitting: Internal Medicine

## 2014-05-03 VITALS — BP 115/72 | HR 74 | Temp 98.2°F | Resp 16 | Ht 68.0 in | Wt 180.0 lb

## 2014-05-03 DIAGNOSIS — B351 Tinea unguium: Secondary | ICD-10-CM | POA: Diagnosis present

## 2014-05-03 DIAGNOSIS — K4090 Unilateral inguinal hernia, without obstruction or gangrene, not specified as recurrent: Secondary | ICD-10-CM | POA: Diagnosis not present

## 2014-05-03 DIAGNOSIS — R609 Edema, unspecified: Secondary | ICD-10-CM | POA: Diagnosis not present

## 2014-05-03 DIAGNOSIS — R600 Localized edema: Secondary | ICD-10-CM

## 2014-05-03 HISTORY — DX: Unilateral inguinal hernia, without obstruction or gangrene, not specified as recurrent: K40.90

## 2014-05-03 MED ORDER — TERBINAFINE HCL 250 MG PO TABS
250.0000 mg | ORAL_TABLET | Freq: Every day | ORAL | Status: DC
Start: 2014-05-03 — End: 2014-12-13

## 2014-05-03 MED ORDER — FUROSEMIDE 20 MG PO TABS: 20.0000 mg | ORAL_TABLET | Freq: Two times a day (BID) | ORAL | Status: DC

## 2014-05-03 MED ORDER — TERBINAFINE HCL 1 % EX CREA: 1.0000 "application " | TOPICAL_CREAM | Freq: Two times a day (BID) | CUTANEOUS | Status: DC

## 2014-05-03 MED ORDER — POTASSIUM CHLORIDE CRYS ER 20 MEQ PO TBCR: 20.0000 meq | EXTENDED_RELEASE_TABLET | Freq: Two times a day (BID) | ORAL | Status: DC

## 2014-05-03 NOTE — Progress Notes (Signed)
Patient ID: Sean MilletVictor Inocencio, male   DOB: Mar 11, 1941, 73 y.o.   MRN: [id number]   Sean MilletVictor Tri, is a 73 y.o. male  [id number]  [id number]  DOB - Mar 11, 1941  Chief Complaint  Patient presents with  . Follow-up        Subjective:   Sean Ward is a 73 y.o. male here today for a follow up visit. Patient has no leg swelling, left inguinoscrotal hernia and chronic feet pain, had today for followup of the pain and also complained of a fungal infection of great toes bilaterally. Patient has appointment with general surgery for hernia repair. Patient's last echocardiogram shows left ventricular ejection fraction of 60-65% with normal wall thickness, no regional wall motion abnormalities. There is no evidence of DVT in the extremities bilaterally. Patient does not smoke cigarette, does not drink alcohol. Patient has No headache, No chest pain, No abdominal pain - No Nausea, No new weakness tingling or numbness, No Cough - SOB.  Problem  Hernia, Inguinal, Left  Onychomycosis    ALLERGIES: No Known Allergies  PAST MEDICAL HISTORY: Past Medical History  Diagnosis Date  . History of bronchitis     MEDICATIONS AT HOME: Prior to Admission medications   Medication Sig Start Date End Date Taking? Authorizing Provider  ibuprofen (ADVIL,MOTRIN) 200 MG tablet Take 200-600 mg by mouth every 6 (six) hours as needed. pain   Yes Historical Provider, MD  Multiple Vitamin (MULTIVITAMIN) tablet Take 1 tablet by mouth daily.   Yes Historical Provider, MD  furosemide (LASIX) 20 MG tablet Take 1 tablet (20 mg total) by mouth 2 (two) times daily. 05/03/14   Jeanann Lewandowskylugbemiga Mayo Faulk, MD  potassium chloride SA (K-DUR,KLOR-CON) 20 MEQ tablet Take 1 tablet (20 mEq total) by mouth 2 (two) times daily. 05/03/14   Jeanann Lewandowskylugbemiga Dejean Tribby, MD  terbinafine (LAMISIL AT) 1 % cream Apply 1 application topically 2 (two) times daily. 05/03/14   Jeanann Lewandowskylugbemiga Keely Drennan, MD  terbinafine (LAMISIL) 250 MG tablet Take 1 tablet (250 mg  total) by mouth daily. 05/03/14   Jeanann Lewandowskylugbemiga Lenzie Sandler, MD     Objective:   Filed Vitals:   05/03/14 1407  BP: 115/72  Pulse: 74  Temp: 98.2 F (36.8 C)  TempSrc: Oral  Resp: 16  Height: 5\' 8"  (1.727 m)  Weight: 180 lb (81.647 kg)  SpO2: 98%    Exam General appearance : Awake, alert, not in any distress. Speech Clear. Not toxic looking HEENT: Atraumatic and Normocephalic, pupils equally reactive to light and accomodation Neck: supple, no JVD. No cervical lymphadenopathy.  Chest:Good air entry bilaterally, no added sounds  CVS: S1 S2 regular, no murmurs.  Abdomen: Bowel sounds present, Non tender and not distended with no gaurding, rigidity or rebound. Extremities: B/L Lower Ext shows no edema, both legs are warm to touch Neurology: Awake alert, and oriented X 3, CN II-XII intact, Non focal Skin:No Rash Wounds:N/A  Data Review No results found for this basename: HGBA1C     Assessment & Plan   1. Hernia, inguinal, left Followup with surgery therefore scheduled inguinal herniorrhaphy Patient was counseled on warning signs of strangulation or obstruction  2. Bilateral edema of lower extremity Will try low dose Lasix - furosemide (LASIX) 20 MG tablet; Take 1 tablet (20 mg total) by mouth 2 (two) times daily.  Dispense: 30 tablet; Refill: 3 - potassium chloride SA (K-DUR,KLOR-CON) 20 MEQ tablet; Take 1 tablet (20 mEq total) by mouth 2 (two) times daily.  Dispense: 30 tablet; Refill: 3  3. Onychomycosis Prescribed -  terbinafine (LAMISIL AT) 1 % cream; Apply 1 application topically 2 (two) times daily.  Dispense: 30 g; Refill: 2 - terbinafine (LAMISIL) 250 MG tablet; Take 1 tablet (250 mg total) by mouth daily.  Dispense: 90 tablet; Refill: 3   Return Patient has appointment with me in Sept, for Follow up Pain and comorbidities, Annual Physical.  The patient was given clear instructions to go to ER or return to medical center if symptoms don't improve, worsen or new  problems develop. The patient verbalized understanding. The patient was told to call to get lab results if they haven't heard anything in the next week.   This note has been created with Education officer, environmental. Any transcriptional errors are unintentional.    Jeanann Lewandowsky, MD, MHA, FACP, FAAP [facility] [place], [place] [phone]   05/03/2014, 2:57 PM

## 2014-05-03 NOTE — Progress Notes (Signed)
Pt is here following up on his chronic feet pain w/ swelling. Pt has an appointment to have hernia repair.  Pt also shows signs of fungus in his b/l great toenails.

## 2014-05-03 NOTE — Patient Instructions (Signed)
Peripheral Edema You have swelling in your legs (peripheral edema). This swelling is due to excess accumulation of salt and water in your body. Edema may be a sign of heart, kidney or liver disease, or a side effect of a medication. It may also be due to problems in the leg veins. Elevating your legs and using special support stockings may be very helpful, if the cause of the swelling is due to poor venous circulation. Avoid long periods of standing, whatever the cause. Treatment of edema depends on identifying the cause. Chips, pretzels, pickles and other salty foods should be avoided. Restricting salt in your diet is almost always needed. Water pills (diuretics) are often used to remove the excess salt and water from your body via urine. These medicines prevent the kidney from reabsorbing sodium. This increases urine flow. Diuretic treatment may also result in lowering of potassium levels in your body. Potassium supplements may be needed if you have to use diuretics daily. Daily weights can help you keep track of your progress in clearing your edema. You should call your caregiver for follow up care as recommended. SEEK IMMEDIATE MEDICAL CARE IF:   You have increased swelling, pain, redness, or heat in your legs.  You develop shortness of breath, especially when lying down.  You develop chest or abdominal pain, weakness, or fainting.  You have a fever. Document Released: 11/15/2004 Document Revised: 12/31/2011 Document Reviewed: 10/26/2009 [facility] Patient Information 2015 [place]. This information is not intended to replace advice given to you by your health care provider. Make sure you discuss any questions you have with your health care provider. Hernia A hernia occurs when an internal organ pushes out through a weak spot in the abdominal wall. Hernias most commonly occur in the groin and around the navel. Hernias often can be pushed back into place (reduced). Most hernias tend to get  worse over time. Some abdominal hernias can get stuck in the opening (irreducible or incarcerated hernia) and cannot be reduced. An irreducible abdominal hernia which is tightly squeezed into the opening is at risk for impaired blood supply (strangulated hernia). A strangulated hernia is a medical emergency. Because of the risk for an irreducible or strangulated hernia, surgery may be recommended to repair a hernia. CAUSES   Heavy lifting.  Prolonged coughing.  Straining to have a bowel movement.  A cut (incision) made during an abdominal surgery. HOME CARE INSTRUCTIONS   Bed rest is not required. You may continue your normal activities.  Avoid lifting more than 10 pounds (4.5 kg) or straining.  Cough gently. If you are a smoker it is best to stop. Even the best hernia repair can break down with the continual strain of coughing. Even if you do not have your hernia repaired, a cough will continue to aggravate the problem.  Do not wear anything tight over your hernia. Do not try to keep it in with an outside bandage or truss. These can damage abdominal contents if they are trapped within the hernia sac.  Eat a normal diet.  Avoid constipation. Straining over long periods of time will increase hernia size and encourage breakdown of repairs. If you cannot do this with diet alone, stool softeners may be used. SEEK IMMEDIATE MEDICAL CARE IF:   You have a fever.  You develop increasing abdominal pain.  You feel nauseous or vomit.  Your hernia is stuck outside the abdomen, looks discolored, feels hard, or is tender.  You have any changes in your bowel  habits or in the hernia that are unusual for you.  You have increased pain or swelling around the hernia.  You cannot push the hernia back in place by applying gentle pressure while lying down. MAKE SURE YOU:   Understand these instructions.  Will watch your condition.  Will get help right away if you are not doing well or get  worse. Document Released: 10/08/2005 Document Revised: 12/31/2011 Document Reviewed: 05/27/2008 [facility] Patient Information 2015 [place], [place]. This information is not intended to replace advice given to you by your health care provider. Make sure you discuss any questions you have with your health care provider.

## 2014-05-06 ENCOUNTER — Encounter: Payer: Self-pay | Admitting: Vascular Surgery

## 2014-05-07 ENCOUNTER — Encounter: Payer: Medicare Other | Admitting: Vascular Surgery

## 2014-05-07 ENCOUNTER — Encounter (HOSPITAL_COMMUNITY): Payer: Medicare Other

## 2014-06-03 ENCOUNTER — Telehealth (INDEPENDENT_AMBULATORY_CARE_PROVIDER_SITE_OTHER): Payer: Self-pay

## 2014-06-03 NOTE — Telephone Encounter (Signed)
The patient cannot stay several days in the hospital after an elective inguinal hernia repair.  Ideally he needs friend or family to help check up on him when he is at home.  It is safe for him to be at home.  Recommend he have premade meals at home.  Recommend that he does not plan to do driving or major activities for the next few days.

## 2014-06-03 NOTE — Telephone Encounter (Signed)
Pt walked into office wanting to see about Dr Michaell CowingGross keeping him several nights in the hospital after having bilateral inguinal hernia repair. The pt has not scheduled surgery yet and actually he has canceled surgery twice already b/c not having anyone to help him at home when he leaves the hospital. I advised pt that normally this surgery is marked outpt b/c the insurance company will not allow the pt to stay several nights in the hospital due to not having help at home. I advised pt that maybe we could mark the stay 23 hr observation if the sx is done later in the day to at least keep him overnight but he would have to go home the next day. I advised pt that he would have to have a ride to the hosp,and home from the hosp. The pt states that he has no family or friends that can stay with him. I advised pt that I think he would be ok to stay by himself as long as he has his pain medication p/u from the pharmacy by his ride on the way home. I advised pt that he is not going to bed ridden b/c we are going to want him moving around in the home so he can fix himself food,drinks, and go to the bathroom. We just don't want any heavy lifting. I advised pt that I would check with Dr Michaell CowingGross on this matter and get back with him. Pt understands.

## 2014-06-09 NOTE — Telephone Encounter (Signed)
Pt called right back having all these questions about risks to surgery, how long in the hospital the day of surgery, risks of being at home after sx, and how long surgery will take. I advised pt that he needs to come back in to see Dr Michaell CowingGross for an appt b/c he has too many questions for me to answer. I will make him an appt.

## 2014-06-09 NOTE — Telephone Encounter (Signed)
Called pt to give him the below message from Dr Michaell CowingGross.

## 2014-06-14 ENCOUNTER — Telehealth: Payer: Self-pay | Admitting: Internal Medicine

## 2014-06-14 ENCOUNTER — Telehealth: Payer: Self-pay | Admitting: Emergency Medicine

## 2014-06-14 NOTE — Telephone Encounter (Signed)
Left message for pt to call when message received 

## 2014-06-14 NOTE — Telephone Encounter (Signed)
Pt. Is calling with concerns about furosemide (LASIX) 20 MG tablet and potassium chloride SA (K-DUR,KLOR-CON) 20 MEQ tablet. Pt would like to know if he still needs to continue the medication. Pt. Has an appt. With Dr. Leonides Sake and pt. Wants to know if he still needs to see that doctor or come to his PCP. Please f/u with pt.

## 2014-06-15 ENCOUNTER — Encounter: Payer: Self-pay | Admitting: Vascular Surgery

## 2014-06-16 ENCOUNTER — Encounter: Payer: Medicare Other | Admitting: Vascular Surgery

## 2014-06-16 ENCOUNTER — Encounter (HOSPITAL_COMMUNITY): Payer: Medicare Other

## 2014-06-23 ENCOUNTER — Encounter (INDEPENDENT_AMBULATORY_CARE_PROVIDER_SITE_OTHER): Payer: Medicare Other | Admitting: Surgery

## 2014-06-25 ENCOUNTER — Ambulatory Visit: Payer: Medicare Other | Admitting: Internal Medicine

## 2014-06-29 ENCOUNTER — Other Ambulatory Visit: Payer: Self-pay | Admitting: Internal Medicine

## 2014-07-01 NOTE — Telephone Encounter (Signed)
Patient coming in to see Dr. Hyman Hopes on 9/14

## 2014-07-05 ENCOUNTER — Ambulatory Visit: Payer: Medicare Other | Attending: Internal Medicine | Admitting: Internal Medicine

## 2014-07-05 ENCOUNTER — Encounter: Payer: Self-pay | Admitting: Internal Medicine

## 2014-07-05 DIAGNOSIS — R609 Edema, unspecified: Secondary | ICD-10-CM | POA: Diagnosis not present

## 2014-07-05 DIAGNOSIS — R600 Localized edema: Secondary | ICD-10-CM

## 2014-07-05 DIAGNOSIS — M545 Low back pain, unspecified: Secondary | ICD-10-CM

## 2014-07-05 DIAGNOSIS — K4090 Unilateral inguinal hernia, without obstruction or gangrene, not specified as recurrent: Secondary | ICD-10-CM

## 2014-07-05 DIAGNOSIS — Z23 Encounter for immunization: Secondary | ICD-10-CM | POA: Diagnosis not present

## 2014-07-05 MED ORDER — FUROSEMIDE 20 MG PO TABS: 20.0000 mg | ORAL_TABLET | Freq: Two times a day (BID) | ORAL | Status: DC

## 2014-07-05 MED ORDER — POTASSIUM CHLORIDE CRYS ER 20 MEQ PO TBCR: 20.0000 meq | EXTENDED_RELEASE_TABLET | Freq: Two times a day (BID) | ORAL | Status: DC

## 2014-07-05 NOTE — Patient Instructions (Signed)
Back Pain, Adult Low back pain is very common. About 1 in 5 people have back pain.The cause of low back pain is rarely dangerous. The pain often gets better over time.About half of people with a sudden onset of back pain feel better in just 2 weeks. About 8 in 10 people feel better by 6 weeks.  CAUSES Some common causes of back pain include:  Strain of the muscles or ligaments supporting the spine.  Wear and tear (degeneration) of the spinal discs.  Arthritis.  Direct injury to the back. DIAGNOSIS Most of the time, the direct cause of low back pain is not known.However, back pain can be treated effectively even when the exact cause of the pain is unknown.Answering your caregiver's questions about your overall health and symptoms is one of the most accurate ways to make sure the cause of your pain is not dangerous. If your caregiver needs more information, he or she may order lab work or imaging tests (X-rays or MRIs).However, even if imaging tests show changes in your back, this usually does not require surgery. HOME CARE INSTRUCTIONS For many people, back pain returns.Since low back pain is rarely dangerous, it is often a condition that people can learn to manageon their own.   Remain active. It is stressful on the back to sit or stand in one place. Do not sit, drive, or stand in one place for more than 30 minutes at a time. Take short walks on level surfaces as soon as pain allows.Try to increase the length of time you walk each day.  Do not stay in bed.Resting more than 1 or 2 days can delay your recovery.  Do not avoid exercise or work.Your body is made to move.It is not dangerous to be active, even though your back may hurt.Your back will likely heal faster if you return to being active before your pain is gone.  Pay attention to your body when you bend and lift. Many people have less discomfortwhen lifting if they bend their knees, keep the load close to their bodies,and  avoid twisting. Often, the most comfortable positions are those that put less stress on your recovering back.  Find a comfortable position to sleep. Use a firm mattress and lie on your side with your knees slightly bent. If you lie on your back, put a pillow under your knees.  Only take over-the-counter or prescription medicines as directed by your caregiver. Over-the-counter medicines to reduce pain and inflammation are often the most helpful.Your caregiver may prescribe muscle relaxant drugs.These medicines help dull your pain so you can more quickly return to your normal activities and healthy exercise.  Put ice on the injured area.  Put ice in a plastic bag.  Place a towel between your skin and the bag.  Leave the ice on for 15-20 minutes, 03-04 times a day for the first 2 to 3 days. After that, ice and heat may be alternated to reduce pain and spasms.  Ask your caregiver about trying back exercises and gentle massage. This may be of some benefit.  Avoid feeling anxious or stressed.Stress increases muscle tension and can worsen back pain.It is important to recognize when you are anxious or stressed and learn ways to manage it.Exercise is a great option. SEEK MEDICAL CARE IF:  You have pain that is not relieved with rest or medicine.  You have pain that does not improve in 1 week.  You have new symptoms.  You are generally not feeling well. SEEK   IMMEDIATE MEDICAL CARE IF:   You have pain that radiates from your back into your legs.  You develop new bowel or bladder control problems.  You have unusual weakness or numbness in your arms or legs.  You develop nausea or vomiting.  You develop abdominal pain.  You feel faint. Document Released: 10/08/2005 Document Revised: 04/08/2012 Document Reviewed: 02/09/2014 [facility] Patient Information 2015 [place], [place]. This information is not intended to replace advice given to you by your health care provider. Make sure you  discuss any questions you have with your health care provider. Hernia A hernia occurs when an internal organ pushes out through a weak spot in the abdominal wall. Hernias most commonly occur in the groin and around the navel. Hernias often can be pushed back into place (reduced). Most hernias tend to get worse over time. Some abdominal hernias can get stuck in the opening (irreducible or incarcerated hernia) and cannot be reduced. An irreducible abdominal hernia which is tightly squeezed into the opening is at risk for impaired blood supply (strangulated hernia). A strangulated hernia is a medical emergency. Because of the risk for an irreducible or strangulated hernia, surgery may be recommended to repair a hernia. CAUSES   Heavy lifting.  Prolonged coughing.  Straining to have a bowel movement.  A cut (incision) made during an abdominal surgery. HOME CARE INSTRUCTIONS   Bed rest is not required. You may continue your normal activities.  Avoid lifting more than 10 pounds (4.5 kg) or straining.  Cough gently. If you are a smoker it is best to stop. Even the best hernia repair can break down with the continual strain of coughing. Even if you do not have your hernia repaired, a cough will continue to aggravate the problem.  Do not wear anything tight over your hernia. Do not try to keep it in with an outside bandage or truss. These can damage abdominal contents if they are trapped within the hernia sac.  Eat a normal diet.  Avoid constipation. Straining over long periods of time will increase hernia size and encourage breakdown of repairs. If you cannot do this with diet alone, stool softeners may be used. SEEK IMMEDIATE MEDICAL CARE IF:   You have a fever.  You develop increasing abdominal pain.  You feel nauseous or vomit.  Your hernia is stuck outside the abdomen, looks discolored, feels hard, or is tender.  You have any changes in your bowel habits or in the hernia that are  unusual for you.  You have increased pain or swelling around the hernia.  You cannot push the hernia back in place by applying gentle pressure while lying down. MAKE SURE YOU:   Understand these instructions.  Will watch your condition.  Will get help right away if you are not doing well or get worse. Document Released: 10/08/2005 Document Revised: 12/31/2011 Document Reviewed: 05/27/2008 [facility] Patient Information 2015 [place], [place]. This information is not intended to replace advice given to you by your health care provider. Make sure you discuss any questions you have with your health care provider.

## 2014-07-05 NOTE — Progress Notes (Signed)
Patient ID: Sean Ward, male   DOB: May 24, 1941, 74 y.o.   MRN: [id number]   Sean Ward, is a 73 y.o. male  WUJ:[id number]  [id number]:[id number]  DOB - 31-Dec-1940  No chief complaint on file.       Subjective:   Sean Ward is a 73 y.o. male here today for a follow up visit. Patient is here today for medication refills. He ran out of his potassium on Lasix about a week ago and now having increasing bilateral lower limb swelling. He denies shortness of breath or chest pain. Patient has inguinal scrotal hernia and has been referred to general surgery, surgery is being scheduled but patient is anxious. Patient is also complaining of low back pain, requesting physical therapy referral. Patient has No headache, No chest pain, No abdominal pain - No Nausea, No new weakness tingling or numbness, No Cough - SOB.  Problem  Need for Prophylactic Vaccination and Inoculation Against Influenza    ALLERGIES: No Known Allergies  PAST MEDICAL HISTORY: Past Medical History  Diagnosis Date  . History of bronchitis     MEDICATIONS AT HOME: Prior to Admission medications   Medication Sig Start Date End Date Taking? Authorizing Provider  furosemide (LASIX) 20 MG tablet Take 1 tablet (20 mg total) by mouth 2 (two) times daily. 07/05/14  Yes Quentin Angst, MD  potassium chloride SA (K-DUR,KLOR-CON) 20 MEQ tablet Take 1 tablet (20 mEq total) by mouth 2 (two) times daily. 07/05/14  Yes Quentin Angst, MD  ibuprofen (ADVIL,MOTRIN) 200 MG tablet Take 200-600 mg by mouth every 6 (six) hours as needed. pain    Historical Provider, MD  Multiple Vitamin (MULTIVITAMIN) tablet Take 1 tablet by mouth daily.    Historical Provider, MD  terbinafine (LAMISIL AT) 1 % cream Apply 1 application topically 2 (two) times daily. 05/03/14   Quentin Angst, MD  terbinafine (LAMISIL) 250 MG tablet Take 1 tablet (250 mg total) by mouth daily. 05/03/14   Quentin Angst, MD     Objective:   There were no  vitals filed for this visit.  Exam General appearance : Awake, alert, not in any distress. Speech Clear. Not toxic looking HEENT: Atraumatic and Normocephalic, pupils equally reactive to light and accomodation Neck: supple, no JVD. No cervical lymphadenopathy.  Chest:Good air entry bilaterally, no added sounds  CVS: S1 S2 regular, no murmurs.  Abdomen: Bowel sounds present, Non tender and not distended with no gaurding, rigidity or rebound. Left inguinal scrotal hernia, reducible. Extremities: B/L Lower Ext shows ++ edema, both legs are warm to touch Neurology: Awake alert, and oriented X 3, CN II-XII intact, Non focal Skin:No Rash Wounds:N/A  Data Review No results found for this basename: HGBA1C     Assessment & Plan   1. Need for prophylactic vaccination and inoculation against influenza Flu vaccine given.  2. Hernia, inguinal, left Patient is encouraged to schedule and follow-up with general surgery.  3. Bilateral edema of lower extremity  - potassium chloride SA (K-DUR,KLOR-CON) 20 MEQ tablet; Take 1 tablet (20 mEq total) by mouth 2 (two) times daily.  Dispense: 30 tablet; Refill: 3 - furosemide (LASIX) 20 MG tablet; Take 1 tablet (20 mg total) by mouth 2 (two) times daily.  Dispense: 30 tablet; Refill: 3 - Basic Metabolic Panel  4. Midline low back pain without sciatica  - Ambulatory referral to Physical Therapy   Return in about 6 months (around 01/03/2015), or if symptoms worsen or fail to improve, for Follow up  Pain and comorbidities, Hernia.  The patient was given clear instructions to go to ER or return to medical center if symptoms don't improve, worsen or new problems develop. The patient verbalized understanding. The patient was told to call to get lab results if they haven't heard anything in the next week.   This note has been created with Education officer, environmental. Any transcriptional errors are unintentional.     Jeanann Lewandowsky, MD, MHA, FACP, FAAP [facility] [place], [place] [phone]   07/05/2014, 3:57 PM

## 2014-07-05 NOTE — Progress Notes (Signed)
Pt here to for medication refills on Lasix and Potassium Chloride States he ran out last week Denies sob or chest pain Swelling +2 noted to both ankles which has improved from last visit VSS. States he never took Lamisil medication

## 2014-07-06 ENCOUNTER — Encounter (INDEPENDENT_AMBULATORY_CARE_PROVIDER_SITE_OTHER): Payer: Medicare Other | Admitting: Surgery

## 2014-07-06 LAB — BASIC METABOLIC PANEL
BUN: 19 mg/dL (ref 6–23)
CO2: 29 mEq/L (ref 19–32)
Calcium: 9 mg/dL (ref 8.4–10.5)
Chloride: 105 mEq/L (ref 96–112)
Creat: 1.17 mg/dL (ref 0.50–1.35)
Glucose, Bld: 80 mg/dL (ref 70–99)
Potassium: 4.1 mEq/L (ref 3.5–5.3)
Sodium: 140 mEq/L (ref 135–145)

## 2014-07-19 ENCOUNTER — Telehealth: Payer: Self-pay | Admitting: Emergency Medicine

## 2014-07-19 NOTE — Telephone Encounter (Signed)
Message copied by Darlis Loan on Mon Jul 19, 2014 10:20 AM ------      Message from: Sean Ward E      Created: Wed Jul 14, 2014  9:39 AM       Please inform patient that his basic metabolic panel is normal ------

## 2014-07-19 NOTE — Telephone Encounter (Signed)
Pt given normal lab results 

## 2014-07-29 ENCOUNTER — Telehealth: Payer: Self-pay | Admitting: Internal Medicine

## 2014-07-29 NOTE — Telephone Encounter (Signed)
Pt. Called to check on his referral for the physical therapist. Please f/u with pt.

## 2014-08-10 ENCOUNTER — Ambulatory Visit: Payer: Medicare Other

## 2014-08-17 ENCOUNTER — Ambulatory Visit: Payer: Medicare Other

## 2014-08-17 ENCOUNTER — Ambulatory Visit: Payer: Medicare Other | Attending: Internal Medicine | Admitting: Physical Therapy

## 2014-08-17 DIAGNOSIS — M545 Low back pain: Secondary | ICD-10-CM | POA: Diagnosis not present

## 2014-08-19 ENCOUNTER — Ambulatory Visit: Payer: Medicare Other | Admitting: Rehabilitation

## 2014-08-19 DIAGNOSIS — M545 Low back pain: Secondary | ICD-10-CM | POA: Diagnosis not present

## 2014-08-26 NOTE — Telephone Encounter (Signed)
Pt called stating that he has an appt on 09/10/14 and could not make his appt here. I gave an appt for 09/09/14@ 11am to cover his tx for 09/10/14....td

## 2014-09-01 ENCOUNTER — Ambulatory Visit: Payer: Medicare Other

## 2014-09-07 ENCOUNTER — Encounter: Payer: Self-pay | Admitting: Physical Therapy

## 2014-09-07 ENCOUNTER — Ambulatory Visit: Payer: Medicare Other | Admitting: Physical Therapy

## 2014-09-07 ENCOUNTER — Encounter: Payer: Medicare Other | Admitting: Physical Therapy

## 2014-09-07 ENCOUNTER — Ambulatory Visit: Payer: Medicare Other | Attending: Internal Medicine | Admitting: Physical Therapy

## 2014-09-07 DIAGNOSIS — M545 Low back pain: Secondary | ICD-10-CM | POA: Insufficient documentation

## 2014-09-07 NOTE — Patient Instructions (Signed)
Abdominal brace 5x, abdominal brace with hand to knee push 5x, abdominal brace with single knee lift 5x; Review of previous HEP

## 2014-09-07 NOTE — Therapy (Signed)
Physical Therapy Treatment  Patient Details  Name: Sean Ward MRN: [id number] Date of Birth: [birth year]  Encounter Date: 09/07/2014      PT End of Session - 09/07/14 0914    Visit Number 3   Number of Visits 16   Date for PT Re-Evaluation 10/17/14   PT Start Time 0818   PT Stop Time 0917   PT Time Calculation (min) 59 min   Activity Tolerance Patient tolerated treatment well      Past Medical History  Diagnosis Date  . History of bronchitis     Past Surgical History  Procedure Laterality Date  . None    . Tonsillectomy    . Hernia repair      There were no vitals taken for this visit.  Visit Diagnosis:  Low back pain without sciatica, unspecified back pain laterality      Subjective Assessment - 09/07/14 0825    Symptoms No back pain at present.  Right knee painful with climbing stairs.  Last had back pain 3 days ago while standing. Pain will go across waist.      Limitations Standing;Lifting   Currently in Pain? No/denies            [facility] - 09/07/14 0832    Exercises   Exercises Lumbar   Lumbar Exercises: Stretches   Active Hamstring Stretch 3 reps;30 seconds   Single Knee to Chest Stretch 3 reps   Double Knee to Chest Stretch 5 reps;Other (comment)  with orange therapy ball   Lower Trunk Rotation 5 reps   Lumbar Exercises: Supine   Ab Set 5 reps;5 seconds   Isometric Hip Flexion 5 reps;5 seconds   Other Supine Lumbar Exercises --  abdominal brace with single knee lift 5x 5 sec hold   Modalities   Modalities Moist Heat   Moist Heat Therapy   Number Minutes Moist Heat --  15 min   Moist Heat Location --  lumbar supine   Manual Therapy   Manual Therapy Joint mobilization;Other (comment)   Joint Mobilization Hip long axis distract, A-P, A-P in IR grade 3 3x 20 sec bilaterally  Lumbar neutral gapping grade 3 3x 20 sec bilaterally          PT Education - 09/07/14 0913    Education provided Yes   Person(s)  Educated Patient   Methods Explanation;Demonstration;Verbal cues;Handout   Comprehension Verbalized understanding;Returned demonstration;Verbal cues required;Tactile cues required          PT Short Term Goals - 09/07/14 0923    PT SHORT TERM GOAL #1   Title Independent with initial HEP   Time 4   Period Weeks   Status Achieved   PT SHORT TERM GOAL #2   Title Lumbar flexion improved to 53 degrees needed for return to recreational activities.     Time 4   Period Weeks   Status On-going   PT SHORT TERM GOAL #3   Title HS length improved to 65 degrees needed for improved to 65 degrees needed for improved mobility with bending and lifting.    Time 4   Period Weeks   Status On-going   PT SHORT TERM GOAL #4   Title Bilateral hip ER improved to 65 degrees needed for preparation of return to recreational activities including getting on/off bike and playing tennis   Time 4   Period Weeks   Status On-going          PT Long Term Goals -  09/07/14 0929    PT LONG TERM GOAL #1   Title Demonstrate and/or verbalize techniques to reduce the risk of re-injury to include info on posture/change of position and basic body mechanics.   Time 8   Period Weeks   Status On-going   PT LONG TERM GOAL #2   Title Be independent with advanced HEP.   Time 8   Period Weeks   Status On-going   PT LONG TERM GOAL #3   Title Abdominal and core strength improved to 4+/5 as needed for lifting bags while traveling.     Time 8   Period Weeks   Status On-going   PT LONG TERM GOAL #4   Title Left hip abduction strength improved to 4/5 as needed for walking linger periods of time while traveling.   Time 8   Period Weeks   Status On-going   PT LONG TERM GOAL #5   Title FOTO functional outcome score improved from 44% limitation to 32% limitation indicating improved function with less pain.   Time 8   Period Weeks   Status On-going          Plan - 09/07/14 [id number]    Clinical Impression Statement  Patient without complaints of back pain today but general stiffness in his back and complaints of right knee pain with climbing the stairs.  Last had back pain with standing, lifting 3 days ago.  Generally hypomobile in lumbar and bilateral hip regions.  Decreased abdominal/trunk strength.  Responded well with stretching and low level core strengthening as well as manual therapy to imrove joint mobility.  Therapist monitoring for pain throughout.     Rehab Potential Good   PT Frequency 2x / week   PT Duration 8 weeks   PT Next Visit Plan Continue with lumbar mobility exercises, core strengthening progression, joint mobilization, soft tissue mobilization to quadratus lumborum, moist heat;  recheck lumbar flexion, HS length,  and hip IR and ER        Problem List Patient Active Problem List   Diagnosis Date Noted  . Need for prophylactic vaccination and inoculation against influenza 07/05/2014  . Hernia, inguinal, left 05/03/2014  . Onychomycosis 05/03/2014  . Scrotal hernia - left 04/08/2014  . Inguinal hernia, right 04/08/2014  . Resting tremor - LUE 04/08/2014  . Bilateral edema of lower extremity 03/25/2014  . Venous stasis 03/25/2014  . Left leg cellulitis 08/20/2012  . Superficial phlebitis 08/20/2012                                              Vivien PrestoSimpson, Niema Carrara C, PT 09/07/2014, 9:42 AM

## 2014-09-09 ENCOUNTER — Ambulatory Visit: Payer: Medicare Other | Admitting: Physical Therapy

## 2014-09-09 DIAGNOSIS — M545 Low back pain: Secondary | ICD-10-CM

## 2014-09-09 NOTE — Patient Instructions (Addendum)

## 2014-09-09 NOTE — Therapy (Signed)
Physical Therapy Treatment  Patient Details  Name: Aundra MilletVictor Switala MRN: [id number] Date of Birth: 03-14-1941  Encounter Date: 09/09/2014      PT End of Session - 09/09/14 1209    Visit Number 4   Number of Visits 16   Date for PT Re-Evaluation 10/17/14   PT Start Time 1100   PT Stop Time 1205   PT Time Calculation (min) 65 min   Activity Tolerance Patient tolerated treatment well      Past Medical History  Diagnosis Date  . History of bronchitis     Past Surgical History  Procedure Laterality Date  . None    . Tonsillectomy    . Hernia repair      There were no vitals taken for this visit.  Visit Diagnosis:  Low back pain without sciatica, unspecified back pain laterality      Subjective Assessment - 09/09/14 1105    Symptoms Feel less pain with standing activities, Last pain a week ago.   Currently in Pain? No/denies            [facility] Adult PT Treatment/Exercise - 09/09/14 1110    Bed Mobility   Bed Mobility Sit to Sidelying Left;Supine to Sit  education, practice   Lumbar Exercises: Stretches   Passive Hamstring Stretch 3 reps;30 seconds  each   Single Knee to Chest Stretch 3 reps;30 seconds  passive   ITB Stretch 3 reps;30 seconds  added to    Piriformis Stretch 3 reps;30 seconds  passive, each   Lumbar Exercises: Standing   Other Standing Lumbar Exercises gastroc,soleus stretch, 3 reps, 30 second holds each exercise, each leg.  instructions for technique.   Lumbar Exercises: Supine   Bridge 10 reps;1 second  using ball   Isometric Hip Flexion 10 reps;3 seconds   Large Ball Oblique Isometric 10 reps;3 seconds   Modalities   Modalities Moist Heat   Moist Heat Therapy   Number Minutes Moist Heat 15 Minutes   Moist Heat Location --  back          PT Education - 09/09/14 1209    Education provided Yes   Person(s) Educated Patient   Methods Demonstration;Tactile cues;Handout   Comprehension Verbalized understanding;Returned  demonstration;Need further instruction              Plan - 09/09/14 1211    Clinical Impression Statement Focus today on education, and stretching.  Rt knee edematous today. no pain except on steps. Progress made ROM and Strength goals.   PT Next Visit Plan Continue previous plan, also strength to Lt hip abductors, measure hamstrings and hip rotation (STG'S #3 and 4)  Continue hip Hinge        Problem List Patient Active Problem List   Diagnosis Date Noted  . Need for prophylactic vaccination and inoculation against influenza 07/05/2014  . Hernia, inguinal, left 05/03/2014  . Onychomycosis 05/03/2014  . Scrotal hernia - left 04/08/2014  . Inguinal hernia, right 04/08/2014  . Resting tremor - LUE 04/08/2014  . Bilateral edema of lower extremity 03/25/2014  . Venous stasis 03/25/2014  . Left leg cellulitis 08/20/2012  . Superficial phlebitis 08/20/2012                                              Christne Platts PTA 09/09/2014, 12:19 PM

## 2014-09-10 ENCOUNTER — Encounter: Payer: Medicare Other | Admitting: Physical Therapy

## 2014-09-10 DIAGNOSIS — K4091 Unilateral inguinal hernia, without obstruction or gangrene, recurrent: Secondary | ICD-10-CM | POA: Diagnosis not present

## 2014-09-20 ENCOUNTER — Ambulatory Visit: Payer: Medicare Other | Admitting: Rehabilitation

## 2014-09-20 DIAGNOSIS — M545 Low back pain: Secondary | ICD-10-CM | POA: Diagnosis not present

## 2014-09-20 NOTE — Therapy (Signed)
Physical Therapy Treatment  Patient Details  Name: Sean Ward MRN: [id number] Date of Birth: 1941/06/29  Encounter Date: 09/20/2014      PT End of Session - 09/20/14 1203    Visit Number 5   Number of Visits 16   Date for PT Re-Evaluation 10/17/14   PT Start Time 1150   PT Stop Time 1230   PT Time Calculation (min) 40 min      Past Medical History  Diagnosis Date  . History of bronchitis     Past Surgical History  Procedure Laterality Date  . None    . Tonsillectomy    . Hernia repair      There were no vitals taken for this visit.  Visit Diagnosis:  Low back pain without sciatica, unspecified back pain laterality      Subjective Assessment - 09/20/14 1156    Symptoms Only feels pain with leaning forward to wash dishes >/= 5 minutes   Limitations --  Standing and leaning forward   Currently in Pain? No/denies   Multiple Pain Sites No          [facility] PT Assessment - 09/20/14 1213    AROM   Right Hip Flexion 65  hamstring length   Right Hip External Rotation  45   Left Hip Flexion 65  hamstring length   Left Hip External Rotation  55   Lumbar Flexion 65   Strength   Right Hip ABduction 4/5   Left Hip ADduction --  4+/5          [facility] Adult PT Treatment/Exercise - 09/20/14 1205    Lumbar Exercises: Supine   Bridge 10 reps;1 second  also single leg bridge x10 , cramps on left H/S   Bridge Limitations --  Bridge with clams x3 x3 x5 x10 cues for level pelvis          PT Education - 09/20/14 1230    Education provided Yes   Education Details Self Care: Body Mechanics with Washing dishes, placing foot in cabinet to decrease LB strain, pt reports improvement, HEP Bridge   Person(s) Educated Patient   Methods Explanation;Demonstration;Handout   Comprehension Verbalized understanding;Returned demonstration          PT Short Term Goals - 09/20/14 1237    PT SHORT TERM GOAL #1   Title Independent with initial HEP   Time 4   Period Weeks    Status Achieved   PT SHORT TERM GOAL #2   Title Lumbar flexion improved to 53 degrees needed for return to recreational activities.     Time 4   Period Weeks   Status Achieved   PT SHORT TERM GOAL #3   Title HS length improved to 65 degrees needed for improved to 65 degrees needed for improved mobility with bending and lifting.    Time 4   Period Weeks   Status Achieved   PT SHORT TERM GOAL #4   Title Bilateral hip ER improved to 65 degrees needed for preparation of return to recreational activities including getting on/off bike and playing tennis   Time 4   Period Weeks   Status On-going          PT Long Term Goals - 09/20/14 1238    PT LONG TERM GOAL #1   Title Demonstrate and/or verbalize techniques to reduce the risk of re-injury to include info on posture/change of position and basic body mechanics.   Time 8   Period Weeks  Status On-going   PT LONG TERM GOAL #2   Title Be independent with advanced HEP.   Time 8   Period Weeks   Status On-going   PT LONG TERM GOAL #3   Title Abdominal and core strength improved to 4+/5 as needed for lifting bags while traveling.     Time 8   Period Weeks   Status On-going   PT LONG TERM GOAL #4   Title Left hip abduction strength improved to 4/5 as needed for walking linger periods of time while traveling.   Time 8   Period Weeks   Status Achieved   PT LONG TERM GOAL #5   Title FOTO functional outcome score improved from 44% limitation to 32% limitation indicating improved function with less pain.   Time 8   Period Weeks   Status On-going          Plan - 09/20/14 1239    Clinical Impression Statement Flexibility slowly improving, hip abduction improved STG# 1,2,3 MET; LTG# 4 MET    PT Next Visit Plan Continue Core, lumbar stab, reinforce body mechanics, hip extension strength        Problem List Patient Active Problem List   Diagnosis Date Noted  . Need for prophylactic vaccination and inoculation against  influenza 07/05/2014  . Hernia, inguinal, left 05/03/2014  . Onychomycosis 05/03/2014  . Scrotal hernia - left 04/08/2014  . Inguinal hernia, right 04/08/2014  . Resting tremor - LUE 04/08/2014  . Bilateral edema of lower extremity 03/25/2014  . Venous stasis 03/25/2014  . Left leg cellulitis 08/20/2012  . Superficial phlebitis 08/20/2012                                              Dorene Ar, PTA 09/20/2014, 12:45 PM

## 2014-09-20 NOTE — Patient Instructions (Signed)
Bridge   Lie back, legs bent. Inhale, pressing hips up. Keeping ribs in, lengthen lower back. Exhale, rolling down along spine from top. Repeat _10__x2 _ times. Do __2__ sessions per day.  Copyright  VHI. All rights reserved.

## 2014-09-22 ENCOUNTER — Ambulatory Visit: Payer: Medicare Other | Attending: Internal Medicine | Admitting: Rehabilitation

## 2014-09-22 ENCOUNTER — Encounter: Payer: Medicare Other | Admitting: Rehabilitation

## 2014-09-22 DIAGNOSIS — M545 Low back pain: Secondary | ICD-10-CM | POA: Insufficient documentation

## 2014-09-22 NOTE — Therapy (Signed)
[facility] [address] [place], [place], [address] Phone: [phone]   Fax:  [phone]  Physical Therapy Treatment  Patient Details  Name: Sean MilletVictor Ward MRN: [id number] Date of Birth: April 12, 1941  Encounter Date: 09/22/2014      PT End of Session - 09/22/14 1212    Visit Number 6   Number of Visits 16   Date for PT Re-Evaluation 10/17/14   PT Start Time 1149   PT Stop Time 1245   PT Time Calculation (min) 56 min      Past Medical History  Diagnosis Date  . History of bronchitis     Past Surgical History  Procedure Laterality Date  . None    . Tonsillectomy    . Hernia repair      There were no vitals taken for this visit.  Visit Diagnosis:  Low back pain without sciatica, unspecified back pain laterality      Subjective Assessment - 09/22/14 1151    Symptoms Pt reports 4/10 pain this morning when he stood up from bed. Otherwise no pain, also he tried placing his foot in the cabinet while washing dishes, and it helped him to not have pain with washing dishes.   Currently in Pain? No/denies            [facility] - 09/22/14 1153    Lumbar Exercises: Stretches   Active Hamstring Stretch 3 reps;30 seconds   Single Knee to Chest Stretch 3 reps;30 seconds   Lower Trunk Rotation 3 reps;30 seconds   Piriformis Stretch 3 reps;30 seconds   Lumbar Exercises: Supine   Ab Set 10 reps;5 seconds  max tactile and verbal cues reqd   Clam 10 reps  max cues   Bridge 20 reps;5 seconds  shoulder bridge   Bridge Limitations --  Bridge with clams x3 x3 x5 x10 cues for level pelvis   Isometric Hip Flexion 10 reps;3 seconds  oblique-opp hand   Other Supine Lumbar Exercises --  bridge with knee to chest x 10 each side   Moist Heat Therapy   Number Minutes Moist Heat 15 Minutes   Moist Heat Location --  lumbar                Plan - 09/22/14 1228    Clinical Impression Statement inconsistent  with HEP, flexibility improving, tolerance to core strengthening improving.    PT Next Visit Plan Continue Core, lumbar stab, reinforce body mechanics, hip extension strength, flexibility         Problem List Patient Active Problem List   Diagnosis Date Noted  . Need for prophylactic vaccination and inoculation against influenza 07/05/2014  . Hernia, inguinal, left 05/03/2014  . Onychomycosis 05/03/2014  . Scrotal hernia - left 04/08/2014  . Inguinal hernia, right 04/08/2014  . Resting tremor - LUE 04/08/2014  . Bilateral edema of lower extremity 03/25/2014  . Venous stasis 03/25/2014  . Left leg cellulitis 08/20/2012  . Superficial phlebitis 08/20/2012    Sean Ward, Sean Ward, PTA 09/22/2014, 12:38 PM

## 2014-09-22 NOTE — Therapy (Deleted)
[facility] [address] [place], [place], [address] Phone: [phone]   Fax:  [phone]  Physical Therapy Treatment  Patient Details  Name: Sean Ward MRN: [id number] Date of Birth: [birth year]  Encounter Date: 09/22/2014      PT End of Session - 09/22/14 1212    Visit Number 6   Number of Visits 16   Date for PT Re-Evaluation 10/17/14   PT Start Time 1149   PT Stop Time 1245   PT Time Calculation (min) 56 min      Past Medical History  Diagnosis Date  . History of bronchitis     Past Surgical History  Procedure Laterality Date  . None    . Tonsillectomy    . Hernia repair      There were no vitals taken for this visit.  Visit Diagnosis:  Low back pain without sciatica, unspecified back pain laterality      Subjective Assessment - 09/22/14 1151    Symptoms Pt reports 4/10 pain this morning when he stood up from bed. Otherwise no pain, also he tried placing his foot in the cabinet while washing dishes, and it helped him to not have pain with washing dishes.   Currently in Pain? No/denies            [facility]Ex[facility] - 09/22/14 1153    Lumbar Exercises: Stretches   Active Hamstring Stretch 3 reps;30 seconds   Single Knee to Chest Stretch 3 reps;30 seconds   Lower Trunk Rotation 3 reps;30 seconds   Piriformis Stretch 3 reps;30 seconds   Lumbar Exercises: Supine   Ab Set 10 reps;5 seconds  max tactile and verbal cues reqd   Clam 10 reps  max cues   Bridge 20 reps;5 seconds  shoulder bridge   Bridge Limitations --  Bridge with clams x3 x3 x5 x10 cues for level pelvis   Isometric Hip Flexion 10 reps;3 seconds  oblique-opp hand   Other Supine Lumbar Exercises --  bridge with knee to chest x 10 each side   Moist Heat Therapy   Number Minutes Moist Heat 15 Minutes   Moist Heat Location --  lumbar        Problem List Patient Active Problem List   Diagnosis Date Noted  . Need for  prophylactic vaccination and inoculation against influenza 07/05/2014  . Hernia, inguinal, left 05/03/2014  . Onychomycosis 05/03/2014  . Scrotal hernia - left 04/08/2014  . Inguinal hernia, right 04/08/2014  . Resting tremor - LUE 04/08/2014  . Bilateral edema of lower extremity 03/25/2014  . Venous stasis 03/25/2014  . Left leg cellulitis 08/20/2012  . Superficial phlebitis 08/20/2012    Sherrie Mustacheonoho, Mykael Batz McGee, PTA 09/22/2014, 12:38 PM

## 2014-09-28 ENCOUNTER — Encounter: Payer: Self-pay | Admitting: Physical Therapy

## 2014-09-28 ENCOUNTER — Ambulatory Visit: Payer: Medicare Other | Admitting: Physical Therapy

## 2014-09-28 ENCOUNTER — Other Ambulatory Visit: Payer: Self-pay | Admitting: Internal Medicine

## 2014-09-28 DIAGNOSIS — M545 Low back pain: Secondary | ICD-10-CM

## 2014-09-28 NOTE — Therapy (Signed)
Outpatient Rehabilitation [facility] [address] [place], [place], [address] Phone: [phone]   Fax:  [phone]  Physical Therapy Treatment  Patient Details  Name: Sean Ward MRN: [id number] Date of Birth: 04-15-1941  Encounter Date: 09/28/2014      PT End of Session - 09/28/14 1143    Visit Number 7   Number of Visits 16   Date for PT Re-Evaluation 10/17/14   PT Start Time 1100   PT Stop Time 1154   PT Time Calculation (min) 54 min   Activity Tolerance Patient tolerated treatment well      Past Medical History  Diagnosis Date  . History of bronchitis     Past Surgical History  Procedure Laterality Date  . None    . Tonsillectomy    . Hernia repair      There were no vitals taken for this visit.  Visit Diagnosis:  Low back pain without sciatica, unspecified back pain laterality      Subjective Assessment - 09/28/14 1103    Symptoms May go to [place] next week but unsure.  Had 5/10 pain yesteday in back.  I try not to sit too long but prolonged standing will bother.  Foot propped in cabinet helps.  The pain is better since starting PT.   How long can you sit comfortably? Max 40 minutes   How long can you walk comfortably? --  I can walk forever.   Currently in Pain? No/denies          [facility] PT Assessment - 09/28/14 1329    Strength   Lumbar Flexion 4/5   Lumbar Extension 4/5          [facility] Adult PT Treatment/Exercise - 09/28/14 1111    Lumbar Exercises: Machines for Strengthening   Other Lumbar Machine Exercise --  Standing lat bar 45# 25x   Lumbar Exercises: Standing   Theraband Level (Row) Level 2 (Red)   Shoulder Extension Both;20 reps   Theraband Level (Shoulder Extension) Level 2 (Red)   Other Standing Lumbar Exercises --  abdominal brace with UE diagonals green band 15x right/left   Lumbar Exercises: Supine   Ab Set --  10x with knee lift single    Clam 15 reps  sidelying 15x green band bilateral   Lumbar Exercises:  Quadruped   Opposite Arm/Leg Raise 10 reps  Discontinued secondary to knee pain   Modalities   Modalities Moist Heat   Moist Heat Therapy   Number Minutes Moist Heat 15 Minutes   Moist Heat Location --  Lumbar supine            PT Short Term Goals - 09/28/14 1326    PT SHORT TERM GOAL #1   Title Independent with initial HEP   Status Achieved   PT SHORT TERM GOAL #2   Title Lumbar flexion improved to 53 degrees needed for return to recreational activities.     Status Achieved   PT SHORT TERM GOAL #3   Title HS length improved to 65 degrees needed for improved to 65 degrees needed for improved mobility with bending and lifting.    Status Achieved   PT SHORT TERM GOAL #4   Title Bilateral hip ER improved to 35 degrees needed for preparation of return to recreational activities including getting on/off bike and playing tennis   Time 4   Period Weeks   Status Revised          PT Long Term Goals - 09/28/14 1328  PT LONG TERM GOAL #1   Title Demonstrate and/or verbalize techniques to reduce the risk of re-injury to include info on posture/change of position and basic body mechanics.   Time 8   Period Weeks   Status On-going   PT LONG TERM GOAL #2   Title Be independent with advanced HEP.   Time 8   Period Weeks   Status On-going   PT LONG TERM GOAL #3   Title Abdominal and core strength improved to 4+/5 as needed for lifting bags while traveling.     Time 8   Period Weeks   Status On-going   PT LONG TERM GOAL #4   Title Left hip abduction strength improved to 4/5 as needed for walking linger periods of time while traveling.   Status Achieved   PT LONG TERM GOAL #5   Title FOTO functional outcome score improved from 44% limitation to 32% limitation indicating improved function with less pain.   Time 8   Period Weeks   Status On-going          Plan - 09/28/14 1323    Clinical Impression Statement Patient improving with pain reduction in low back which he  states he had not achieved with previous chiropractic treatment.  Minimal discomfort in back following moderate level core. Verbal cues needed for abdominal bracing especially with a progression to standing core strengthening. No additional goals met but progressing.  Should meet remaing goals in next 2 -3 weeks.     PT Next Visit Plan Continue Core, lumbar stab, reinforce body mechanics, hip extension strength, flexibility ;  check hip ER ROM next visit.                               Problem List Patient Active Problem List   Diagnosis Date Noted  . Need for prophylactic vaccination and inoculation against influenza 07/05/2014  . Hernia, inguinal, left 05/03/2014  . Onychomycosis 05/03/2014  . Scrotal hernia - left 04/08/2014  . Inguinal hernia, right 04/08/2014  . Resting tremor - LUE 04/08/2014  . Bilateral edema of lower extremity 03/25/2014  . Venous stasis 03/25/2014  . Left leg cellulitis 08/20/2012  . Superficial phlebitis 08/20/2012    Ruben Im C 09/28/2014, 1:34 PM   Ruben Im, PT 09/28/2014 1:34 PM Phone: [phone] Fax: [phone]

## 2014-09-30 ENCOUNTER — Ambulatory Visit: Payer: Medicare Other | Admitting: Physical Therapy

## 2014-10-01 ENCOUNTER — Other Ambulatory Visit: Payer: Self-pay | Admitting: Emergency Medicine

## 2014-10-01 ENCOUNTER — Telehealth: Payer: Self-pay | Admitting: Internal Medicine

## 2014-10-01 DIAGNOSIS — R600 Localized edema: Secondary | ICD-10-CM

## 2014-10-01 MED ORDER — FUROSEMIDE 20 MG PO TABS: 20.0000 mg | ORAL_TABLET | Freq: Two times a day (BID) | ORAL | Status: DC

## 2014-10-01 NOTE — Telephone Encounter (Signed)
Patient has called in today to receive a refill for furosemide (LASIX) 20 MG tablet; please f/u with patient in lobby

## 2014-10-06 ENCOUNTER — Ambulatory Visit: Payer: Medicare Other | Admitting: Rehabilitation

## 2014-10-07 ENCOUNTER — Ambulatory Visit: Payer: Medicare Other | Admitting: Physical Therapy

## 2014-10-07 DIAGNOSIS — M545 Low back pain: Secondary | ICD-10-CM

## 2014-10-07 NOTE — Therapy (Signed)
[facility] [address] [place], [place], [address] Phone: [phone]   Fax:  [phone]  Physical Therapy Treatment  Patient Details  Name: Sean Ward MRN: [id number] Date of Birth: [birth year]  Encounter Date: 10/07/2014      PT End of Session - 10/07/14 1145    Visit Number 8   Number of Visits 16   Date for PT Re-Evaluation 10/17/14   PT Start Time 1100   PT Stop Time 1146   PT Time Calculation (min) 46 min   Activity Tolerance Patient tolerated treatment well;Patient limited by pain      Past Medical History  Diagnosis Date  . History of bronchitis     Past Surgical History  Procedure Laterality Date  . None    . Tonsillectomy    . Hernia repair      There were no vitals taken for this visit.  Visit Diagnosis:  Low back pain without sciatica, unspecified back pain laterality      Subjective Assessment - 10/07/14 1123    Symptoms 6/10 pain has up and down about the same.  wors with standing doing things, uncomfortable with sitting , after 45 to 60 minutes pain uncreases.            [facility] Adult PT Treatment/Exercise - 10/07/14 1130    Lumbar Exercises: Stretches   Active Hamstring Stretch --  3 Lt, 5 rt 30 seconds.   Active Hamstring Stretch Limitations --  50 degrees Lt, Rt 52 degrees   Passive Hamstring Stretch 3 reps;30 seconds   Lumbar Exercises: Supine   Bridge --  5 reps , stiff   Lumbar Exercises: Prone   Other Prone Lumbar Exercises --  Hip IR rotation active and passive s   Modalities   Modalities Moist Heat   Moist Heat Therapy   Number Minutes Moist Heat 15 Minutes   Moist Heat Location --  back            PT Short Term Goals - 10/07/14 1331    PT SHORT TERM GOAL #3   Time 4   Period Weeks   Status On-going   PT SHORT TERM GOAL #4   Title Bilateral hip ER improved to 35 degrees needed for preparation of return to recreational activities including getting on/off bike and  playing tennis   Time 4   Period Weeks   Status On-going            Plan - 10/07/14 1329    Clinical Impression Statement Patient stiff and moves guarded. compliant with hom exercise.  Passive hip IR feels good to patient  14 degrees Rt, 15 degrees Lt hip IR.  Progress toward those goals.   PT Next Visit Plan Hip IR strectc passive                               Problem List Patient Active Problem List   Diagnosis Date Noted  . Need for prophylactic vaccination and inoculation against influenza 07/05/2014  . Hernia, inguinal, left 05/03/2014  . Onychomycosis 05/03/2014  . Scrotal hernia - left 04/08/2014  . Inguinal hernia, right 04/08/2014  . Resting tremor - LUE 04/08/2014  . Bilateral edema of lower extremity 03/25/2014  . Venous stasis 03/25/2014  . Left leg cellulitis 08/20/2012  . Superficial phlebitis 08/20/2012   Liz BeachKaren Harris, PTA 10/07/2014 1:32 PM Phone: [phone] Fax: [phone]   HARRIS,KAREN 10/07/2014, 1:32 PM

## 2014-10-08 ENCOUNTER — Ambulatory Visit: Payer: Medicare Other | Admitting: Physical Therapy

## 2014-10-08 DIAGNOSIS — M545 Low back pain: Secondary | ICD-10-CM

## 2014-10-08 NOTE — Therapy (Signed)
[place], [place], [address] Phone: [phone]   Fax:  [phone]  Physical Therapy Treatment  Patient Details  Name: Sean Ward MRN: [id number] Date of Birth: [birth year]  Encounter Date: 10/08/2014      PT End of Session - 10/08/14 1103    PT Stop Time 1112      Past Medical History  Diagnosis Date  . History of bronchitis     Past Surgical History  Procedure Laterality Date  . None    . Tonsillectomy    . Hernia repair      There were no vitals taken for this visit.  Visit Diagnosis:  Low back pain without sciatica, unspecified back pain laterality      Subjective Assessment - 10/08/14 1020    Symptoms This pain having some pain around the waist 5/10.  States he feels he is making progress.                    [facility] Adult PT Treatment/Exercise - 10/08/14 1044    Lumbar Exercises: Stretches   Passive Hamstring Stretch 3 reps;30 seconds   Double Knee to Chest Stretch 10 seconds  using ball   Lower Trunk Rotation 5 reps  using ball   Piriformis Stretch 10 seconds  using ball bilaterally   Lumbar Exercises: Standing   Other Standing Lumbar Exercises --  Wall planks 10x   Other Standing Lumbar Exercises --  Lat pull downs 30# 20x   Lumbar Exercises: Supine   Bridge 10 reps  LEs over ball   Moist Heat Therapy   Number Minutes Moist Heat 15 Minutes   Moist Heat Location --  back   Manual Therapy   Joint Mobilization --  B hip mobs, distraction, inferior, A-P, P-A in IR and ER G 3                  PT Short Term Goals - 10/08/14 1101    PT SHORT TERM GOAL #1   Title Independent with initial HEP   Status Achieved   PT SHORT TERM GOAL #2   Title Lumbar flexion improved to 53 degrees needed for return to recreational activities.     Status Achieved   PT SHORT TERM GOAL #3   Title HS length improved to 65 degrees needed for improved to 65 degrees  needed for improved mobility with bending and lifting.    Time 4   Period Weeks   Status On-going   PT SHORT TERM GOAL #4   Title Bilateral hip ER improved to 35 degrees needed for preparation of return to recreational activities including getting on/off bike and playing tennis   Time 4   Period Weeks   Status On-going           PT Long Term Goals - 10/08/14 1102    PT LONG TERM GOAL #1   Title Demonstrate and/or verbalize techniques to reduce the risk of re-injury to include info on posture/change of position and basic body mechanics.   Time 8   Period Weeks   Status On-going   PT LONG TERM GOAL #2   Title Be independent with advanced HEP.   Time 8   Period Weeks   Status On-going   PT LONG TERM GOAL #3   Title Abdominal and core strength improved to 4+/5 as needed for lifting bags while traveling.     Time 8   Period Weeks  Status On-going   PT LONG TERM GOAL #4   Title Left hip abduction strength improved to 4/5 as needed for walking linger periods of time while traveling.   Time 8   Status Achieved   PT LONG TERM GOAL #5   Title FOTO functional outcome score improved from 44% limitation to 32% limitation indicating improved function with less pain.   Time 8   Period Weeks   Status On-going               Plan - 10/08/14 1058    Clinical Impression Statement Patient continues to have lumbar and LE stiffness as well.  Hip IR and ER somewhat improved with hip mobs.  Verbal cuing for abdominal brace technique. Therapist monitoring pain response to exercise.  Patient states "it  feels good.".  Probable discharge in 2-3 visits if majority of goals met.     PT Next Visit Plan Continue with LE and lumbar stretching especially lumbar rotation and hip IR and ER.  Core strengthening.  Continue assessment of goals for probable discharge in 2-3 visits.          Problem List Patient Active Problem List   Diagnosis Date Noted  . Need for prophylactic vaccination and  inoculation against influenza 07/05/2014  . Hernia, inguinal, left 05/03/2014  . Onychomycosis 05/03/2014  . Scrotal hernia - left 04/08/2014  . Inguinal hernia, right 04/08/2014  . Resting tremor - LUE 04/08/2014  . Bilateral edema of lower extremity 03/25/2014  . Venous stasis 03/25/2014  . Left leg cellulitis 08/20/2012  . Superficial phlebitis 08/20/2012    Alvera Singh 10/08/2014, 11:04 AM  [facility] [address] [place], [place], [address] Phone: [phone]   Fax:  [phone]    Ruben Im, PT 10/08/2014 11:05 AM Phone: [phone] Fax: [phone]

## 2014-10-11 ENCOUNTER — Encounter: Payer: Medicare Other | Admitting: Rehabilitation

## 2014-10-14 ENCOUNTER — Ambulatory Visit: Payer: Medicare Other | Admitting: Physical Therapy

## 2014-10-18 ENCOUNTER — Telehealth: Payer: Self-pay | Admitting: Emergency Medicine

## 2014-10-18 ENCOUNTER — Telehealth: Payer: Self-pay | Admitting: Internal Medicine

## 2014-10-18 NOTE — Telephone Encounter (Signed)
Left message for pt to call nurse line in regards to swelling in legs. Medication lasix reordered last week and e-scribed to our pharmacy Pt instructed to call nurse for questions or concerns

## 2014-10-18 NOTE — Telephone Encounter (Signed)
Patient called to request a medication refill for the swelling of his feet. Patient states that he would like to get some medication to treat and speak to a nurse. Please f/u with pt.

## 2014-10-25 ENCOUNTER — Encounter: Payer: Self-pay | Admitting: Internal Medicine

## 2014-10-25 ENCOUNTER — Other Ambulatory Visit: Payer: Self-pay

## 2014-10-25 ENCOUNTER — Ambulatory Visit: Payer: Medicare Other | Attending: Internal Medicine | Admitting: Internal Medicine

## 2014-10-25 VITALS — BP 121/77 | HR 77 | Temp 98.3°F | Resp 16 | Ht 68.0 in | Wt 189.0 lb

## 2014-10-25 DIAGNOSIS — R600 Localized edema: Secondary | ICD-10-CM | POA: Insufficient documentation

## 2014-10-25 DIAGNOSIS — K4090 Unilateral inguinal hernia, without obstruction or gangrene, not specified as recurrent: Secondary | ICD-10-CM | POA: Diagnosis not present

## 2014-10-25 DIAGNOSIS — I499 Cardiac arrhythmia, unspecified: Secondary | ICD-10-CM | POA: Diagnosis not present

## 2014-10-25 DIAGNOSIS — I4891 Unspecified atrial fibrillation: Secondary | ICD-10-CM | POA: Insufficient documentation

## 2014-10-25 MED ORDER — POTASSIUM CHLORIDE ER 20 MEQ PO TBCR: 1.0000 | EXTENDED_RELEASE_TABLET | Freq: Two times a day (BID) | ORAL | Status: DC

## 2014-10-25 MED ORDER — METOPROLOL TARTRATE 12.5 MG HALF TABLET: 12.5000 mg | ORAL_TABLET | Freq: Two times a day (BID) | ORAL | Status: DC

## 2014-10-25 MED ORDER — FUROSEMIDE 20 MG PO TABS: 20.0000 mg | ORAL_TABLET | Freq: Two times a day (BID) | ORAL | Status: DC

## 2014-10-25 MED ORDER — ASPIRIN EC 325 MG PO TBEC: 325.0000 mg | DELAYED_RELEASE_TABLET | Freq: Every day | ORAL | Status: DC

## 2014-10-25 NOTE — Progress Notes (Signed)
Pt is here today c/o b/l swollen feet. Pt quit taking his lasix b/c it was causing him to have polyuria.

## 2014-10-25 NOTE — Progress Notes (Signed)
Patient ID: Aser Nylund, male   DOB: 1941-06-02, 74 y.o.   MRN: [id number]   Theodoro Koval, is a 74 y.o. male  WUJ:[id number]  [id number]  DOB - Sep 01, 1941  Chief Complaint  Patient presents with  . Follow-up        Subjective:   Makel Mcmann is a 74 y.o. male here today for a follow up visit. Patient has no significant past medical history except for recently diagnosed inguinal scrotal hernia awaiting surgery scheduled for January 2016, and bilateral pitting pedal edema of unknown cause. Last echocardiogram June 2015 showed normal wall thickness and normal systolic function with estimated ejection fraction in the range of 60-65% without any regional wall motion abnormalities. Doppler ultrasound of both lower extremities negative for DVT. He is here today for routine follow-up. He has no major complaint except for ongoing lower limbs edema. He is presently on furosemide and potassium supplement, his been out of potassium for about 3 weeks but continues to take the furosemide. He has no dizziness, no headache, no chest pain. Except for increased frequency of urination as a result of taking Lasix he has no other symptom, no dysuria, no urgency. He has postponed his hernia surgery twice due to anxiety, now I agreed to do it somewhere in [place]. Patient has no history of heart disease, never been told of irregular heartbeat. Patient has No headache, No chest pain, No abdominal pain - No Nausea, No new weakness tingling or numbness, No Cough - SOB.  No problems updated.  ALLERGIES: No Known Allergies  PAST MEDICAL HISTORY: Past Medical History  Diagnosis Date  . History of bronchitis     MEDICATIONS AT HOME: Prior to Admission medications   Medication Sig Start Date End Date Taking? Authorizing Provider  Multiple Vitamin (MULTIVITAMIN) tablet Take 1 tablet by mouth daily.   Yes Historical Provider, MD  furosemide (LASIX) 20 MG tablet Take 1 tablet (20 mg total) by mouth 2 (two)  times daily. 10/25/14   Quentin Angst, MD  ibuprofen (ADVIL,MOTRIN) 200 MG tablet Take 200-600 mg by mouth every 6 (six) hours as needed. pain    Historical Provider, MD  Potassium Chloride ER 20 MEQ TBCR Take 1 tablet by mouth 2 (two) times daily. 10/25/14   Quentin Angst, MD  terbinafine (LAMISIL AT) 1 % cream Apply 1 application topically 2 (two) times daily. Patient not taking: Reported on 10/25/2014 05/03/14   Quentin Angst, MD  terbinafine (LAMISIL) 250 MG tablet Take 1 tablet (250 mg total) by mouth daily. Patient not taking: Reported on 10/25/2014 05/03/14   Quentin Angst, MD     Objective:   Filed Vitals:   10/25/14 1100  BP: 121/77  Pulse: 77  Temp: 98.3 F (36.8 C)  TempSrc: Oral  Resp: 16  Height:  (1.727 m)  Weight: 189 lb (85.73 kg)  SpO2: 99%    Exam General appearance : Awake, alert, not in any distress. Speech Clear. Not toxic looking HEENT: Atraumatic and Normocephalic, pupils equally reactive to light and accomodation Neck: supple, no JVD. No cervical lymphadenopathy.  Chest:Good air entry bilaterally, no added sounds  CVS: Irregularly irregular Heart beat, no murmur Abdomen: Inguinoscrotal hernia. Bowel sounds present, Non tender and not distended with no gaurding, rigidity or rebound. Extremities: B/L Lower Ext shows ++ edema, both legs are warm to touch Neurology: Awake alert, and oriented X 3, CN II-XII intact, Non focal   Data Review No results found for: HGBA1C   Assessment &  Plan   1. Bilateral edema of lower extremity  - COMPLETE METABOLIC PANEL WITH GFR - TSH - Magnesium - Calcium, ionized - Potassium Chloride ER 20 MEQ TBCR; Take 1 tablet by mouth 2 (two) times daily.  Dispense: 180 tablet; Refill: 3 - furosemide (LASIX) 20 MG tablet; Take 1 tablet (20 mg total) by mouth 2 (two) times daily.  Dispense: 180 tablet; Refill: 3  2. 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 Daleen Squibb on Wednesday   Patient has been counseled extensively about nutrition and exercise Patient was educated on his diagnosis and the need to follow-up with cardiologist  Return in about 3 months (around 01/24/2015), or if symptoms worsen or fail to improve, for Routine Follow U, herniap.  The patient was given clear instructions to go to ER or return to medical center if symptoms don't improve, worsen or new problems develop. The patient verbalized understanding. The patient was told to call to get lab results if they haven't heard anything in the next week.   This note has been created with Education officer, environmental. Any transcriptional errors are unintentional.    Jeanann Lewandowsky, MD, MHA, FACP, FAAP [facility] [place], [place] [phone]   10/25/2014, 11:50 AM

## 2014-10-25 NOTE — Addendum Note (Signed)
Addended by: Allayne Stack R on: 10/25/2014 06:05 PM   Modules accepted: Orders

## 2014-10-26 ENCOUNTER — Ambulatory Visit: Payer: No Typology Code available for payment source | Attending: Internal Medicine | Admitting: Physical Therapy

## 2014-10-26 DIAGNOSIS — M545 Low back pain: Secondary | ICD-10-CM | POA: Insufficient documentation

## 2014-10-26 NOTE — Therapy (Signed)
[facility] [address] [place], [place], [address] Phone: [phone]   Fax:  [phone]  Physical Therapy Treatment/Re-Evaluation  Patient Details  Name: Sean MilletVictor Ward MRN: [id number] Date of Birth: 21-Nov-1940  Encounter Date: 10/26/2014      PT End of Session - 10/26/14 1410    Visit Number 10   Number of Visits 16   Date for PT Re-Evaluation 12/21/14   PT Start Time 1115   PT Stop Time 1233   PT Time Calculation (min) 78 min   Activity Tolerance Patient tolerated treatment well      Past Medical History  Diagnosis Date  . History of bronchitis     Past Surgical History  Procedure Laterality Date  . None    . Tonsillectomy    . Hernia repair      There were no vitals taken for this visit.  Visit Diagnosis:  Low back pain without sciatica, unspecified back pain laterality - Plan: PT plan of care cert/re-cert      Subjective Assessment - 10/26/14 1154    Symptoms Patient arrives 15 min late.  Patient states he feels he is a lot better except when standing slightly forward bent.  Now the pain is only once in a while.  Overall 65% better.  I'm going out of the country in February.     Currently in Pain? No/denies          [facility] - 10/26/14 1238    Assessment   Medical Diagnosis --  LBP   Precautions   Precautions None   Prior Function   Level of Independence Independent with basic ADLs   Observation/Other Assessments   Focus on Therapeutic Outcomes (FOTO)  --  Score worsened from 44% limit to 45%   AROM   Right Hip External Rotation  35   Left Hip External Rotation  35   Lumbar Flexion 52   Strength   Right Hip ABduction 4/5   Lumbar Flexion 4/5   Lumbar Extension 4/5                  [facility] Adult PT Treatment/Exercise - 10/26/14 1213    Lumbar Exercises: Aerobic   Stationary Bike 8   UBE (Upper Arm Bike) --  Nu-Step 7 min level 4   Lumbar Exercises: Machines for  Strengthening   Leg Press --  40# B; 20# left; 20x each;  discontinued right pain   Other Lumbar Machine Exercise --  Standing lat bar pull down 30# 20x   Moist Heat Therapy   Number Minutes Moist Heat 10 Minutes   Moist Heat Location --  lumbar                  PT Short Term Goals - 10/26/14 1207    PT SHORT TERM GOAL #1   Title Independent with initial HEP   Status Achieved   PT SHORT TERM GOAL #2   Title Lumbar flexion improved to 53 degrees needed for return to recreational activities.     Status Achieved   PT SHORT TERM GOAL #3   Title HS length improved to 65 degrees needed for improved to 65 degrees needed for improved mobility with bending and lifting.    Status Achieved   PT SHORT TERM GOAL #4   Title Bilateral hip ER improved to 35 degrees needed for preparation of return to recreational activities including getting on/off bike and playing tennis   Status Achieved  PT Long Term Goals - 11-11-14 1155    PT LONG TERM GOAL #1   Title Demonstrate and/or verbalize techniques to reduce the risk of re-injury to include info on posture/change of position and basic body mechanics.   Time 8   Period Weeks   Status On-going   PT LONG TERM GOAL #2   Title Be independent with advanced HEP.   Time 8   Period Weeks   Status On-going   PT LONG TERM GOAL #3   Title Abdominal and core strength improved to 4+/5 as needed for lifting bags while traveling.     Time 8   Period Weeks   Status On-going   PT LONG TERM GOAL #4   Title Left hip abduction strength improved to 4/5 as needed for walking linger periods of time while traveling.   Status Achieved   PT LONG TERM GOAL #5   Title FOTO functional outcome score improved from 44% limitation to 32% limitation indicating improved function with less pain.   Time 8   Period Weeks   Status On-going               Plan - Nov 11, 2014 1411    Clinical Impression Statement Patient reports he is overall 65%  better although his FOTO functional outcome indicates no improvement.  Lumbar AROM and hip AROM with improvements.  Still with deficits with transverse abdominal recruitment.  Patient would like to join a gym or  group exercise class.  He will investigate his coverage for Silver Sneakers.  The patient would like to continue with a few more visits of PT to ensure a smooth transition to an independent HEP without a LBP exacerbation.  He is also travelling to [place] and [place] and would like a return visit after his travels.  Continue 3-4 visits over 8 weeks.     Pt will benefit from skilled therapeutic intervention in order to improve on the following deficits Pain;Decreased strength;Impaired flexibility   Rehab Potential Good   PT Frequency 1x / week   PT Duration 8 weeks   PT Treatment/Interventions Therapeutic exercise;Patient/family education;Manual techniques   PT Next Visit Plan 3-4 visits for further core strengthening and to ensure safe, self progression of home and gym program upon discharge without exacerbation of LBP          G-Codes - 11/11/14 1433    Functional Assessment Tool Used --  FOTO; clinical judgement   Functional Limitation Mobility: Walking and moving around   Mobility: Walking and Moving Around Current Status (Z6109) At least 40 percent but less than 60 percent impaired, limited or restricted   Mobility: Walking and Moving Around Goal Status (U0454) At least 20 percent but less than 40 percent impaired, limited or restricted      Problem List Patient Active Problem List   Diagnosis Date Noted  . Need for prophylactic vaccination and inoculation against influenza 07/05/2014  . Hernia, inguinal, left 05/03/2014  . Onychomycosis 05/03/2014  . Scrotal hernia - left 04/08/2014  . Inguinal hernia, right 04/08/2014  . Resting tremor - LUE 04/08/2014  . Bilateral edema of lower extremity 03/25/2014  . Venous stasis 03/25/2014  . Left leg cellulitis 08/20/2012  .  Superficial phlebitis 08/20/2012    Sean Ward 11/11/14, 2:34 PM  [facility] [address] [place], [place], [address] Phone: [phone]   Fax:  [phone]

## 2014-10-27 ENCOUNTER — Ambulatory Visit: Payer: Medicare Other | Attending: Cardiology | Admitting: Cardiology

## 2014-10-27 ENCOUNTER — Encounter: Payer: Self-pay | Admitting: Cardiology

## 2014-10-27 VITALS — BP 123/79 | HR 71 | Temp 97.5°F | Resp 20 | Ht 68.0 in | Wt 187.0 lb

## 2014-10-27 DIAGNOSIS — Z7982 Long term (current) use of aspirin: Secondary | ICD-10-CM | POA: Diagnosis not present

## 2014-10-27 DIAGNOSIS — I499 Cardiac arrhythmia, unspecified: Secondary | ICD-10-CM | POA: Diagnosis not present

## 2014-10-27 DIAGNOSIS — I4891 Unspecified atrial fibrillation: Secondary | ICD-10-CM | POA: Insufficient documentation

## 2014-10-27 DIAGNOSIS — I878 Other specified disorders of veins: Secondary | ICD-10-CM | POA: Diagnosis not present

## 2014-10-27 DIAGNOSIS — R600 Localized edema: Secondary | ICD-10-CM | POA: Diagnosis not present

## 2014-10-27 DIAGNOSIS — Z79899 Other long term (current) drug therapy: Secondary | ICD-10-CM | POA: Insufficient documentation

## 2014-10-27 LAB — COMPLETE METABOLIC PANEL WITH GFR
ALK PHOS: 101 U/L (ref 39–117)
ALT: 20 U/L (ref 0–53)
AST: 20 U/L (ref 0–37)
Albumin: 3.6 g/dL (ref 3.5–5.2)
BILIRUBIN TOTAL: 0.9 mg/dL (ref 0.2–1.2)
BUN: 23 mg/dL (ref 6–23)
CO2: 25 mEq/L (ref 19–32)
Calcium: 8.8 mg/dL (ref 8.4–10.5)
Chloride: 104 mEq/L (ref 96–112)
Creat: 1 mg/dL (ref 0.50–1.35)
GFR, Est African American: 87 mL/min
GFR, Est Non African American: 75 mL/min
Glucose, Bld: 96 mg/dL (ref 70–99)
Potassium: 4.5 mEq/L (ref 3.5–5.3)
Sodium: 139 mEq/L (ref 135–145)
Total Protein: 6 g/dL (ref 6.0–8.3)

## 2014-10-27 LAB — MAGNESIUM: Magnesium: 2 mg/dL (ref 1.5–2.5)

## 2014-10-27 LAB — TSH: TSH: 2.384 u[IU]/mL (ref 0.350–4.500)

## 2014-10-27 MED ORDER — FUROSEMIDE 20 MG PO TABS: 40.0000 mg | ORAL_TABLET | Freq: Every day | ORAL | Status: DC

## 2014-10-27 NOTE — Assessment & Plan Note (Signed)
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 He clearly is better when he gets up in the morning or if he elevates his legs. No further workup

## 2014-10-27 NOTE — Progress Notes (Signed)
Patient seen by Dr. Hyman HopesJegede on 10/25/14. Found to have atrial fibrillation with RVR. Started on ASA 325 mg daily and Lopressor 12.5 mg twice daily. Patient taking medications as prescribed. Patient denies palpitations, racing heart rate. Indicates he has intermittent shortness of breath and bilateral LE edema.

## 2014-10-27 NOTE — Progress Notes (Signed)
HPI Sean Ward is a 74 year old [place] man who comes today referred by Dr.Jegede for the evaluation of newly diagnosed atrial fibrillation. He was noted to have irregular heart rate yesterday and EKG showed atrial fibrillation with a rate of 115-120 bpm. He is totally asymptomatic. He has no underlying risk factors for development of A. fib other than advancing age.  He has history of chronic lower extremity edema that was worked up last summer in the hospital. Venous Dopplers were negative for clot but he did have interstitial edema noted. In addition he had an echocardiogram which was completely normal.  Since starting the beta blocker yesterday his heart rate is now 70 and irregular. He is also on aspirin with a Chads-Vasc score of less than 1.  He also takes Lasix 20 mg twice a day for edema. It is worsened in the day or with bending his knees. Is better when he gets up in the morning and if he elevates his legs during the day. He denies orthopnea, PND. 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  Past Medical History  Diagnosis Date  . History of bronchitis     Current Outpatient Prescriptions  Medication Sig Dispense Refill  . aspirin EC 325 MG tablet Take 1 tablet (325 mg total) by mouth daily. 30 tablet 0  . furosemide (LASIX) 20 MG tablet Take 2 tablets (40 mg total) by mouth daily. Take every morning 180 tablet 3  . ibuprofen (ADVIL,MOTRIN) 200 MG tablet Take 200-600 mg by mouth every 6 (six) hours as needed. pain    . metoprolol tartrate (LOPRESSOR) 12.5 mg TABS tablet Take 0.5 tablets (12.5 mg total) by mouth 2 (two) times daily. 30 tablet 3  . Multiple Vitamin (MULTIVITAMIN) tablet Take 1 tablet by mouth daily.    . Potassium Chloride ER 20 MEQ TBCR Take 1 tablet by mouth 2 (two) times daily. 180 tablet 3  . terbinafine (LAMISIL AT) 1 % cream Apply 1 application topically 2 (two) times daily. (Patient not taking: Reported on 10/27/2014) 30 g 2  . terbinafine  (LAMISIL) 250 MG tablet Take 1 tablet (250 mg total) by mouth daily. (Patient not taking: Reported on 10/27/2014) 90 tablet 3   No current facility-administered medications for this visit.    No Known Allergies  History reviewed. No pertinent family history.  History   Social History  . Marital Status: Legally Separated    Spouse Name: N/A    Number of Children: N/A  . Years of Education: N/A   Occupational History  . Not on file.   Social History Main Topics  . Smoking status: Never Smoker   . Smokeless tobacco: Never Used  . Alcohol Use: Yes     Comment: wine occasionally  . Drug Use: No  . Sexual Activity: No   Other Topics Concern  . Not on file   Social History Narrative    ROS ALL NEGATIVE EXCEPT THOSE NOTED IN HPI  PE  General Appearance: well developed, well nourished in no acute distress HEENT: symmetrical face, PERRLA, good dentition  Neck: no JVD, thyromegaly, or adenopathy, trachea midline Chest: symmetric without deformity Cardiac: PMI non-displaced, irregular rate and rhythm, normal S1, S2, no gallop or murmur Lung: clear to ausculation and percussion Vascular: all pulses full without bruits  Abdominal: nondistended, nontender, good bowel sounds, no HSM, no bruits Extremities: no cyanosis, clubbing, 2+ ankle edema is pitting, no sign of DVT, no varicosities  Skin: normal color, no rashes Neuro:  alert and oriented x 3, non-focal Pysch: normal affect  EKG  BMET    Component Value Date/Time   NA 140 07/05/2014 1603   K 4.1 07/05/2014 1603   CL 105 07/05/2014 1603   CO2 29 07/05/2014 1603   GLUCOSE 80 07/05/2014 1603   BUN 19 07/05/2014 1603   CREATININE 1.17 07/05/2014 1603   CREATININE 1.18 03/12/2014 1203   CALCIUM 9.0 07/05/2014 1603   GFRNONAA 60* 03/12/2014 1203   GFRAA 70* 03/12/2014 1203    Lipid Panel  No results found for: CHOL, TRIG, HDL, CHOLHDL, VLDL, LDLCALC  CBC    Component Value Date/Time   WBC 7.2 08/21/2012 0430    RBC 4.54 08/21/2012 0430   HGB 14.2 08/21/2012 0430   HCT 40.6 08/21/2012 0430   PLT 220 08/21/2012 0430   MCV 89.4 08/21/2012 0430   MCH 31.3 08/21/2012 0430   MCHC 35.0 08/21/2012 0430   RDW 13.0 08/21/2012 0430   LYMPHSABS 1.2 08/20/2012 1000   MONOABS 0.7 08/20/2012 1000   EOSABS 0.3 08/20/2012 1000   BASOSABS 0.0 08/20/2012 1000

## 2014-10-27 NOTE — Patient Instructions (Signed)
Stop taking Lasix 20 mg twice daily and begin taking Lasix 40 mg every morning. This will work better for your swelling. Continue taking all other medications as prescribed. Please return to see me in 3 months.  Thanks so much!

## 2014-10-27 NOTE — Assessment & Plan Note (Signed)
He is totally asymptomatic and this is a new diagnosis. He had a normal echocardiogram summer 2015 which was normal. He has no significant risk factors for embolic stroke. His rate is well-controlled today on low-dose beta blocker and he has also been started on an aspirin. I've made no changes in treatment or recommendations. I have spent a long time educating him about the importance of taking his medications to avoid heart failure or stroke. We went to teach back and I think he clearly understands. All questions answered. I will see him back in 3 months.

## 2014-10-27 NOTE — Assessment & Plan Note (Signed)
His edema has prevented him from playing tennis. He still enjoys riding a stationary bike and a bike for transportation. He also enjoys hiking. I've advised him to take his furosemide 40 mg by mouth every morning as opposed to 20 mg twice a day. He will continue with potassium supplementation. All questions answered.

## 2014-10-28 LAB — CALCIUM, IONIZED: Calcium, Ion: 1.23 mmol/L (ref 1.12–1.32)

## 2014-11-01 ENCOUNTER — Other Ambulatory Visit: Payer: Self-pay | Admitting: Emergency Medicine

## 2014-11-01 DIAGNOSIS — R600 Localized edema: Secondary | ICD-10-CM

## 2014-11-01 MED ORDER — FUROSEMIDE 40 MG PO TABS: 40.0000 mg | ORAL_TABLET | Freq: Every day | ORAL | Status: DC

## 2014-11-03 ENCOUNTER — Ambulatory Visit: Payer: No Typology Code available for payment source | Admitting: Rehabilitation

## 2014-11-03 DIAGNOSIS — M545 Low back pain: Secondary | ICD-10-CM

## 2014-11-03 NOTE — Therapy (Signed)
[facility] [address] [place], [place], [address] Phone: [phone]   Fax:  [phone]  Physical Therapy Treatment  Patient Details  Name: Sean Ward MRN: [id number] Date of Birth: 1940/12/15 Referring Provider:  Quentin Angst, MD  Encounter Date: 11/03/2014      PT End of Session - 11/03/14 1625    Visit Number 11   Number of Visits 16   Date for PT Re-Evaluation 12/21/14   PT Start Time 0345   PT Stop Time 0425   PT Time Calculation (min) 40 min      Past Medical History  Diagnosis Date  . History of bronchitis     Past Surgical History  Procedure Laterality Date  . None    . Tonsillectomy    . Hernia repair      There were no vitals taken for this visit.  Visit Diagnosis:  Low back pain without sciatica, unspecified back pain laterality      Subjective Assessment - 11/03/14 1612    Symptoms No  pain today. 6/10 pain bil mid back yesterday lasting 3 hours due to standing. Relieved by lying down.    Currently in Pain? No/denies                    [facility] Adult PT Treatment/Exercise - 11/03/14 1613    Lumbar Exercises: Stretches   Active Hamstring Stretch 3 reps;30 seconds   Lumbar Exercises: Aerobic   Stationary Bike Nustep level 6 x 10 min   Lumbar Exercises: Machines for Strengthening   Cybex Knee Extension 1 plate x 20 , half ROM, weak   Cybex Knee Flexion 2 plates x 20, increased pain bil posterior knees   Leg Press 2 plates bil x 20 single 1 plate x 10 each   Lumbar Exercises: Supine   Bridge 10 reps;5 seconds   Other Supine Lumbar Exercises Bridge with 6 clams x 2, bridge with march x 10                  PT Short Term Goals - 10/26/14 1207    PT SHORT TERM GOAL #1   Title Independent with initial HEP   Status Achieved   PT SHORT TERM GOAL #2   Title Lumbar flexion improved to 53 degrees needed for return to recreational activities.     Status Achieved   PT SHORT TERM GOAL #3   Title HS length improved to 65 degrees needed for improved to 65 degrees needed for improved mobility with bending and lifting.    Status Achieved   PT SHORT TERM GOAL #4   Title Bilateral hip ER improved to 35 degrees needed for preparation of return to recreational activities including getting on/off bike and playing tennis   Status Achieved           PT Long Term Goals - 10/26/14 1155    PT LONG TERM GOAL #1   Title Demonstrate and/or verbalize techniques to reduce the risk of re-injury to include info on posture/change of position and basic body mechanics.   Time 8   Period Weeks   Status On-going   PT LONG TERM GOAL #2   Title Be independent with advanced HEP.   Time 8   Period Weeks   Status On-going   PT LONG TERM GOAL #3   Title Abdominal and core strength improved to 4+/5 as needed for lifting bags while traveling.     Time 8  Period Weeks   Status On-going   PT LONG TERM GOAL #4   Title Left hip abduction strength improved to 4/5 as needed for walking linger periods of time while traveling.   Status Achieved   PT LONG TERM GOAL #5   Title FOTO functional outcome score improved from 44% limitation to 32% limitation indicating improved function with less pain.   Time 8   Period Weeks   Status On-going               Plan - 11/03/14 1627    PT Next Visit Plan 2-3 more visits for further core strengthening and to ensure safe, self progression of home and gym program upon discharge without exacerbation of LBP        Problem List Patient Active Problem List   Diagnosis Date Noted  . Atrial fibrillation 10/27/2014  . Irregular heart beat 10/27/2014  . Need for prophylactic vaccination and inoculation against influenza 07/05/2014  . Hernia, inguinal, left 05/03/2014  . Onychomycosis 05/03/2014  . Scrotal hernia - left 04/08/2014  . Inguinal hernia, right 04/08/2014  . Resting tremor - LUE 04/08/2014  . Bilateral edema of lower  extremity 03/25/2014  . Venous stasis 03/25/2014  . Left leg cellulitis 08/20/2012  . Superficial phlebitis 08/20/2012    Sherrie MustacheDonoho, Laurina Fischl McGee, PTA 11/03/2014, 4:27 PM  [facility] [address] [place], [place], [address] Phone: [phone]   Fax:  [phone]

## 2014-11-05 ENCOUNTER — Ambulatory Visit: Payer: No Typology Code available for payment source | Admitting: Physical Therapy

## 2014-11-05 ENCOUNTER — Telehealth: Payer: Self-pay | Admitting: Emergency Medicine

## 2014-11-05 NOTE — Telephone Encounter (Signed)
Left message with improved lab results

## 2014-11-05 NOTE — Telephone Encounter (Signed)
-----   Message from Quentin Angstlugbemiga E Jegede, MD sent at 10/29/2014  9:19 AM EST ----- Please inform patient that his laboratory test results are all within normal limits including electrolytes

## 2014-11-08 ENCOUNTER — Ambulatory Visit: Payer: No Typology Code available for payment source | Admitting: Rehabilitation

## 2014-11-08 DIAGNOSIS — M545 Low back pain: Secondary | ICD-10-CM

## 2014-11-08 NOTE — Therapy (Signed)
[facility] [address] [place], [place], [address] Phone: [phone]   Fax:  [phone]  Physical Therapy Treatment  Patient Details  Name: Sean Ward MRN: [id number] Date of Birth: 1941/09/14 Referring Provider:  Quentin AngstJegede, Olugbemiga E, MD  Encounter Date: 11/08/2014      PT End of Session - 11/08/14 1351    Visit Number 12   Number of Visits 16   Date for PT Re-Evaluation 12/21/14   PT Start Time 0150   PT Stop Time 0222   PT Time Calculation (min) 32 min      Past Medical History  Diagnosis Date  . History of bronchitis     Past Surgical History  Procedure Laterality Date  . None    . Tonsillectomy    . Hernia repair      There were no vitals taken for this visit.  Visit Diagnosis:  No diagnosis found.      Subjective Assessment - 11/08/14 1350    Symptoms Patient arrives 15 minutes late for treatment and then needed a bathroom break.                     [facility] Adult PT Treatment/Exercise - 11/08/14 1412    Lumbar Exercises: Aerobic   Stationary Bike Rec Bike level 1 x 5 min ;Nustep level 5 x 7 min   Lumbar Exercises: Machines for Strengthening   Cybex Knee Extension 1 plate x 20 , half ROM, weak   Cybex Knee Flexion 2 plates x 20, increased pain bil posterior knees   Leg Press 2- 3 plates bil , single leg 2 plates x 20 each   Lumbar Exercises: Standing   Row Strengthening;Both;15 reps   Theraband Level (Row) Level 2 (Red)   Shoulder Extension Power Tower;Strengthening;Both;15 reps   Theraband Level (Shoulder Extension) Level 2 (Red)                  PT Short Term Goals - 10/26/14 1207    PT SHORT TERM GOAL #1   Title Independent with initial HEP   Status Achieved   PT SHORT TERM GOAL #2   Title Lumbar flexion improved to 53 degrees needed for return to recreational activities.     Status Achieved   PT SHORT TERM GOAL #3   Title HS length improved to 65 degrees needed  for improved to 65 degrees needed for improved mobility with bending and lifting.    Status Achieved   PT SHORT TERM GOAL #4   Title Bilateral hip ER improved to 35 degrees needed for preparation of return to recreational activities including getting on/off bike and playing tennis   Status Achieved           PT Long Term Goals - 10/26/14 1155    PT LONG TERM GOAL #1   Title Demonstrate and/or verbalize techniques to reduce the risk of re-injury to include info on posture/change of position and basic body mechanics.   Time 8   Period Weeks   Status On-going   PT LONG TERM GOAL #2   Title Be independent with advanced HEP.   Time 8   Period Weeks   Status On-going   PT LONG TERM GOAL #3   Title Abdominal and core strength improved to 4+/5 as needed for lifting bags while traveling.     Time 8   Period Weeks   Status On-going   PT LONG TERM GOAL #4   Title  Left hip abduction strength improved to 4/5 as needed for walking linger periods of time while traveling.   Status Achieved   PT LONG TERM GOAL #5   Title FOTO functional outcome score improved from 44% limitation to 32% limitation indicating improved function with less pain.   Time 8   Period Weeks   Status On-going               Plan - 11/08/14 1351    Clinical Impression Statement Treatment limited due to patient tardiness. Pt reports he is still working on his files so he felt 6/10 pain at end of standing for 3-4 hours and also leaning over and lifting a couple of boxes.    PT Next Visit Plan 1 more visit, finalize HEP/ use of gym equipment and DC, pt plans to begin group program        Problem List Patient Active Problem List   Diagnosis Date Noted  . Atrial fibrillation 10/27/2014  . Irregular heart beat 10/27/2014  . Need for prophylactic vaccination and inoculation against influenza 07/05/2014  . Hernia, inguinal, left 05/03/2014  . Onychomycosis 05/03/2014  . Scrotal hernia - left 04/08/2014  .  Inguinal hernia, right 04/08/2014  . Resting tremor - LUE 04/08/2014  . Bilateral edema of lower extremity 03/25/2014  . Venous stasis 03/25/2014  . Left leg cellulitis 08/20/2012  . Superficial phlebitis 08/20/2012    Sherrie Mustache, PTA 11/08/2014, 2:33 PM  [facility] [address] [place], [place], [address] Phone: [phone]   Fax:  [phone]

## 2014-11-09 ENCOUNTER — Encounter: Payer: Medicare Other | Admitting: Rehabilitation

## 2014-11-16 ENCOUNTER — Ambulatory Visit: Payer: No Typology Code available for payment source | Admitting: Physical Therapy

## 2014-11-22 ENCOUNTER — Ambulatory Visit: Payer: Medicare Other

## 2014-11-23 ENCOUNTER — Ambulatory Visit: Payer: Medicare Other | Attending: Internal Medicine | Admitting: Rehabilitation

## 2014-11-23 DIAGNOSIS — M545 Low back pain: Secondary | ICD-10-CM | POA: Insufficient documentation

## 2014-11-23 NOTE — Therapy (Signed)
[place], [place], [address] Phone: [phone]   Fax:  [phone]  Physical Therapy Treatment  Patient Details  Name: Sean Ward MRN: [id number] Date of Birth: 1940-12-17 Referring Provider:  Tresa Garter, MD  Encounter Date: 11/23/2014      PT End of Session - 11/23/14 1207    Visit Number 13   Number of Visits 16   Date for PT Re-Evaluation 12/21/14   PT Start Time 5916   PT Stop Time 1245   PT Time Calculation (min) 60 min      Past Medical History  Diagnosis Date  . History of bronchitis     Past Surgical History  Procedure Laterality Date  . None    . Tonsillectomy    . Hernia repair      There were no vitals taken for this visit.  Visit Diagnosis:  Low back pain without sciatica, unspecified back pain laterality      Subjective Assessment - 11/23/14 1155    Symptoms Not really in pain.           [facility] PT Assessment - 11/23/14 1304    Observation/Other Assessments   Focus on Therapeutic Outcomes (FOTO)  Score worsened from 44% to 50% limited                  [facility] Adult PT Treatment/Exercise - 11/23/14 1204    Lumbar Exercises: Aerobic   Stationary Bike Nustep L5 x 10 min   Tread Mill T.M. 1.5 to 2.3 mph HR 106 bpm, SPo2 96% x 8 min   Lumbar Exercises: Machines for Strengthening   Cybex Knee Extension 1 platex 20    Cybex Knee Flexion 2 plates x 20    Leg Press 2-3 plates x 60 bilateral then 2 plates unilateral x 20 each   Other Lumbar Machine Exercise --  Standing lat bar pull down 30# 20x   Lumbar Exercises: Standing   Row Power tower;Both;10 reps  2 plates   Moist Heat Therapy   Number Minutes Moist Heat 15 Minutes   Moist Heat Location --  lumbar                  PT Short Term Goals - 10/26/14 1207    PT SHORT TERM GOAL #1   Title Independent with initial HEP   Status Achieved   PT SHORT TERM GOAL #2   Title Lumbar flexion  improved to 53 degrees needed for return to recreational activities.     Status Achieved   PT SHORT TERM GOAL #3   Title HS length improved to 65 degrees needed for improved to 65 degrees needed for improved mobility with bending and lifting.    Status Achieved   PT SHORT TERM GOAL #4   Title Bilateral hip ER improved to 35 degrees needed for preparation of return to recreational activities including getting on/off bike and playing tennis   Status Achieved           PT Long Term Goals - 11/23/14 1242    PT LONG TERM GOAL #1   Title Demonstrate and/or verbalize techniques to reduce the risk of re-injury to include info on posture/change of position and basic body mechanics.   Time 8   Period Weeks   Status Achieved   PT LONG TERM GOAL #2   Title Be independent with advanced HEP.   Time 8   Period Weeks   Status Achieved  PT LONG TERM GOAL #3   Title Abdominal and core strength improved to 4+/5 as needed for lifting bags while traveling.     Time 8   Period Weeks   Status Unable to assess   PT LONG TERM GOAL #4   Title Left hip abduction strength improved to 4/5 as needed for walking linger periods of time while traveling.   Time 8   Period Weeks   Status Achieved   PT LONG TERM GOAL #5   Title FOTO functional outcome score improved from 44% limitation to 32% limitation indicating improved function with less pain.   Time 8   Period Weeks   Status Not Met               Plan - 11/23/14 1306    Clinical Impression Statement Pt reports pain increases when using poor body mechanics. See goals met.    PT Next Visit Plan Discharge today. Pt plans to join our group exercise class to continue strengthening. He hopes to return to tennis.         Problem List Patient Active Problem List   Diagnosis Date Noted  . Atrial fibrillation 10/27/2014  . Irregular heart beat 10/27/2014  . Need for prophylactic vaccination and inoculation against influenza 07/05/2014  .  Hernia, inguinal, left 05/03/2014  . Onychomycosis 05/03/2014  . Scrotal hernia - left 04/08/2014  . Inguinal hernia, right 04/08/2014  . Resting tremor - LUE 04/08/2014  . Bilateral edema of lower extremity 03/25/2014  . Venous stasis 03/25/2014  . Left leg cellulitis 08/20/2012  . Superficial phlebitis 08/20/2012    Dorene Ar, PTA 11/23/2014, 1:11 PM  [facility] [address] [place], [place], [address] Phone: [phone]   Fax:  [phone]     PHYSICAL THERAPY DISCHARGE SUMMARY  Visits from Start of Care: 13 Current functional level related to goals / functional outcomes: Partial LTGs met.  See above.   Remaining deficits: Pain reported with poor body mechanics.  The patient no longer needs skilled PT.     Education / Equipment: Patient plans to initiate post-rehab ex class next week for continue of mobility and strengthening exercise. Plan: Patient agrees to discharge.  Patient goals were partially met. Patient is being discharged due to meeting the stated rehab goals.  ?????  Ruben Im, PT 11/23/2014 3:15 PM Phone: [phone] Fax: [phone]

## 2014-11-29 ENCOUNTER — Ambulatory Visit: Payer: Medicare Other | Admitting: Internal Medicine

## 2014-12-13 ENCOUNTER — Encounter: Payer: Self-pay | Admitting: Internal Medicine

## 2014-12-13 ENCOUNTER — Ambulatory Visit: Payer: Medicare Other | Attending: Internal Medicine | Admitting: Internal Medicine

## 2014-12-13 VITALS — BP 123/75 | HR 73 | Temp 98.1°F | Ht 68.0 in | Wt 196.8 lb

## 2014-12-13 DIAGNOSIS — Z7982 Long term (current) use of aspirin: Secondary | ICD-10-CM | POA: Diagnosis not present

## 2014-12-13 DIAGNOSIS — R609 Edema, unspecified: Secondary | ICD-10-CM | POA: Diagnosis present

## 2014-12-13 DIAGNOSIS — R600 Localized edema: Secondary | ICD-10-CM | POA: Diagnosis not present

## 2014-12-13 MED ORDER — FUROSEMIDE 40 MG PO TABS: 40.0000 mg | ORAL_TABLET | Freq: Every day | ORAL | Status: DC

## 2014-12-13 MED ORDER — POTASSIUM CHLORIDE ER 20 MEQ PO TBCR: 1.0000 | EXTENDED_RELEASE_TABLET | Freq: Two times a day (BID) | ORAL | Status: DC

## 2014-12-13 NOTE — Progress Notes (Signed)
Patient states he has been off of lasix x 1 week. He did not refill the Rx because it made him go to the bathroom to often.  He states he has been having increased swelling and pain in both feet and ankles, as well as behind his left knee.  He purchased a pair of compression stockings this AM but has not used them.

## 2014-12-13 NOTE — Progress Notes (Signed)
Patient ID: Sean Ward, male   DOB: [birth year], 74 y.o.   MRN: [id number]   Sean MilletVictor Ward, is a 74 y.o. male  [id number]  [id number]  DOB - [birth year]  Chief Complaint  Patient presents with  . Follow-up        Subjective:   Sean Ward is a 74 y.o. male here today for a follow up visit. Patient has dependent bilateral lower limb edema with negative workup for DVTs and other known causes of edema, stopped using his Lasix for the past 1 week because of frequent urination, now having increasing swelling in his feet. He states he has been having increasing swelling and pain sometimes up to the knee. He has gone to a medical supplies store today to purchase a pair of compression stockings and would like to know if these will work better than medication. He has no other complaints. He is yet to go for his hernia surgery which was advised over a year ago. He has appointment coming up later this month and the surgery may be scheduled a week or so after the appointment. He does not smoke cigarettes, does not drink alcohol. He is to ambulate without fall or dizziness. Patient has No headache, No chest pain, No abdominal pain - No Nausea, No new weakness tingling or numbness, No Cough - SOB.  No problems updated.  ALLERGIES: No Known Allergies  PAST MEDICAL HISTORY: Past Medical History  Diagnosis Date  . History of bronchitis     MEDICATIONS AT HOME: Prior to Admission medications   Medication Sig Start Date End Date Taking? Authorizing Provider  aspirin EC 325 MG tablet Take 1 tablet (325 mg total) by mouth daily. 10/25/14  Yes Quentin Angstlugbemiga E Dontavious Emily, MD  ibuprofen (ADVIL,MOTRIN) 200 MG tablet Take 200-600 mg by mouth every 6 (six) hours as needed. pain   Yes Historical Provider, MD  metoprolol tartrate (LOPRESSOR) 12.5 mg TABS tablet Take 0.5 tablets (12.5 mg total) by mouth 2 (two) times daily. 10/25/14  Yes Quentin Angstlugbemiga E Morelia Cassells, MD  Multiple Vitamin (MULTIVITAMIN) tablet Take 1  tablet by mouth daily.   Yes Historical Provider, MD  furosemide (LASIX) 40 MG tablet Take 1 tablet (40 mg total) by mouth daily. Take every morning 12/13/14   Quentin Angstlugbemiga E Gussie Murton, MD  Potassium Chloride ER 20 MEQ TBCR Take 1 tablet by mouth 2 (two) times daily. 12/13/14   Quentin Angstlugbemiga E Kemauri Musa, MD     Objective:   Filed Vitals:   12/13/14 1438  BP: 123/75  Pulse: 73  Temp: 98.1 F (36.7 C)  TempSrc: Oral  Height: 5\' 8"  (1.727 m)  Weight: 196 lb 12.8 oz (89.268 kg)  SpO2: 98%    Exam General appearance : Awake, alert, not in any distress. Speech Clear. Not toxic looking HEENT: Atraumatic and Normocephalic, pupils equally reactive to light and accomodation Neck: supple, no JVD. No cervical lymphadenopathy.  Chest:Good air entry bilaterally, no added sounds  CVS: S1 S2 regular, no murmurs.  Abdomen: Bilateral inguinal scrotal hernia. Bowel sounds present, Non tender and not distended with no gaurding, rigidity or rebound. Extremities: B/L Lower Ext shows marked pitting pedal edema, both legs are warm to touch Neurology: Awake alert, and oriented X 3, CN II-XII intact, Non focal Skin:No Rash Wounds:N/A  Data Review No results found for: HGBA1C   Assessment & Plan   1. Bilateral edema of lower extremity  - Potassium Chloride ER 20 MEQ TBCR; Take 1 tablet by mouth 2 (two) times daily.  Dispense:  180 tablet; Refill: 3 - furosemide (LASIX) 40 MG tablet; Take 1 tablet (40 mg total) by mouth daily. Take every morning  Dispense: 30 tablet; Refill: 3   Patient was counseled extensively about nutrition and exercise Patient was encouraged to go for his hernia surgery which may help with the dependent edema when the scrotal swelling and pressure around the pelvic area is relieved   Return in about 3 months (around 03/13/2015), or if symptoms worsen or fail to improve, for Leg Edema.  The patient was given clear instructions to go to ER or return to medical center if symptoms don't  improve, worsen or new problems develop. The patient verbalized understanding. The patient was told to call to get lab results if they haven't heard anything in the next week.   This note has been created with Education officer, environmental. Any transcriptional errors are unintentional.    Jeanann Lewandowsky, MD, MHA, FACP, FAAP [facility] [place], [place] [phone]   12/13/2014, 3:26 PM

## 2014-12-13 NOTE — Patient Instructions (Signed)

## 2014-12-17 ENCOUNTER — Telehealth: Payer: Self-pay | Admitting: Internal Medicine

## 2014-12-17 NOTE — Telephone Encounter (Signed)
Sean SheldonAshley faxed over a clearnace form for surgery back in nov. but has not received anything yet. Please follow up with her and let her know the status.

## 2014-12-22 ENCOUNTER — Telehealth: Payer: Self-pay | Admitting: Internal Medicine

## 2014-12-22 DIAGNOSIS — K4091 Unilateral inguinal hernia, without obstruction or gangrene, recurrent: Secondary | ICD-10-CM | POA: Diagnosis not present

## 2014-12-22 NOTE — Telephone Encounter (Signed)
Morrie SheldonAshley from [facility] faxed over a medical clearance from cardiology in order for patient to have surgery. Form was filled and faxed back to facility on 12/22/2014. Faxed to PalmerAshley at [phone].

## 2014-12-23 ENCOUNTER — Telehealth: Payer: Self-pay | Admitting: Emergency Medicine

## 2014-12-23 NOTE — Telephone Encounter (Signed)
Left message for pt to schedule appt with Dr. Wall for cardiac clearance 

## 2015-01-26 ENCOUNTER — Ambulatory Visit: Payer: Medicare Other | Admitting: Cardiology

## 2015-02-02 ENCOUNTER — Encounter: Payer: Self-pay | Admitting: Cardiology

## 2015-02-02 ENCOUNTER — Ambulatory Visit: Payer: Medicare Other | Attending: Cardiology | Admitting: Cardiology

## 2015-02-02 VITALS — BP 112/71 | HR 79 | Temp 97.5°F | Resp 18 | Ht 68.0 in | Wt 187.8 lb

## 2015-02-02 DIAGNOSIS — I480 Paroxysmal atrial fibrillation: Secondary | ICD-10-CM | POA: Insufficient documentation

## 2015-02-02 DIAGNOSIS — I878 Other specified disorders of veins: Secondary | ICD-10-CM | POA: Diagnosis not present

## 2015-02-02 DIAGNOSIS — I4891 Unspecified atrial fibrillation: Secondary | ICD-10-CM

## 2015-02-02 DIAGNOSIS — R600 Localized edema: Secondary | ICD-10-CM | POA: Insufficient documentation

## 2015-02-02 DIAGNOSIS — K4090 Unilateral inguinal hernia, without obstruction or gangrene, not specified as recurrent: Secondary | ICD-10-CM | POA: Insufficient documentation

## 2015-02-02 MED ORDER — METOPROLOL TARTRATE 25 MG PO TABS: 12.5000 mg | ORAL_TABLET | Freq: Two times a day (BID) | ORAL | Status: DC

## 2015-02-02 NOTE — Assessment & Plan Note (Signed)
Stable. He did not feel Lasix helped him. He has stopped it altogether.

## 2015-02-02 NOTE — Assessment & Plan Note (Signed)
Stable

## 2015-02-02 NOTE — Assessment & Plan Note (Signed)
Continue low-dose metoprolol and aspirin. I have cleared him for left inguinal hernia surgery and sent a letter. He can stop his aspirin a week prior to surgery and then start back once hemostasis is maintained.

## 2015-02-02 NOTE — Progress Notes (Signed)
Patient here for 3 month follow up. Patient needs to clarify the dosage of aspirin to take. Patient wants to know why he is taking metoprolol if his blood pressure is good. Patient reports stopping furosemide "about a month ago, it's not doing anything for me". Patient needs refill of metoprolol.

## 2015-02-02 NOTE — Progress Notes (Signed)
Mr Sean Ward returns today for evaluation and management is a history of new-onset paroxysmal atrial fibrillation. He is asymptomatic as before. He continues on low-dose metoprolol for rate control when he goes into it. He is on aspirin 325 mg a day.  He tried Lasix but did not help his edema. He has stopped altogether.  He denies orthopnea PND. He's had no shortness of breath. His edema has been stable.  His exam today is unchanged. Specifically, no acute distress, he is in normal rhythm today by apical exam. No JVD. Lungs clear to auscultation. Heart reveals a nondisplaced PMI normal S1-S2. Extremities reveal 1+ pretibial edema

## 2015-04-20 ENCOUNTER — Telehealth: Payer: Self-pay | Admitting: Internal Medicine

## 2015-04-20 NOTE — Telephone Encounter (Signed)
Patient has come in today to request two referrals to Opthalmology and Dermatology; please f/u with patient about whether or not they should come in for an appointment or if referral can just be placed w/o one

## 2015-04-22 NOTE — Telephone Encounter (Signed)
Nurse called patient, patient verified date of birth. Patient would like referral to opthalmology and dermatology. Patient reports rash on head, "has been there for a while, Ive tried everything, it's not working". Rash itches.  Patient needs appointment to be seen. Patient transferred to front office staff to make appointment. Patient voices understanding and has no further questions at this time.

## 2015-05-18 ENCOUNTER — Other Ambulatory Visit: Payer: Self-pay

## 2015-05-18 ENCOUNTER — Other Ambulatory Visit: Payer: Self-pay | Admitting: Cardiology

## 2015-05-18 ENCOUNTER — Telehealth: Payer: Self-pay | Admitting: General Practice

## 2015-05-18 NOTE — Telephone Encounter (Signed)
Patient presents to clinic to reschedule appointment with PCP Patient then request to have a call from nurse to speak about an optometrist referral. Explained to patient that he would need an appointment for referrals. Patient verbalized understanding, but states that since it's just an eye exam PCP may proceed with placing referral. Informed patient that I would put the request in and nurse would give him a call back to discuss. Please assist.

## 2015-05-18 NOTE — Telephone Encounter (Signed)
Hi Heather, I am sending you this refill request because we received it but I cant refill it due to Dr. Daleen Squibb no longer being here. Thank you for your time.

## 2015-05-19 ENCOUNTER — Ambulatory Visit: Payer: Medicare Other | Admitting: Internal Medicine

## 2015-06-09 ENCOUNTER — Ambulatory Visit: Payer: Medicare Other | Admitting: Internal Medicine

## 2015-07-13 ENCOUNTER — Telehealth: Payer: Self-pay

## 2015-07-13 NOTE — Telephone Encounter (Signed)
Nurse called patient, reached voicemail. Left message for patient to call Heather with [facility], at [phone]. Nurse called patient to see if he still needs vision referral.

## 2015-08-02 DIAGNOSIS — L84 Corns and callosities: Secondary | ICD-10-CM | POA: Diagnosis not present

## 2015-08-02 DIAGNOSIS — R601 Generalized edema: Secondary | ICD-10-CM | POA: Diagnosis not present

## 2015-08-02 DIAGNOSIS — L02611 Cutaneous abscess of right foot: Secondary | ICD-10-CM | POA: Diagnosis not present

## 2015-08-02 DIAGNOSIS — L603 Nail dystrophy: Secondary | ICD-10-CM | POA: Diagnosis not present

## 2015-09-02 ENCOUNTER — Other Ambulatory Visit: Payer: Self-pay | Admitting: Cardiology

## 2015-09-26 DIAGNOSIS — L409 Psoriasis, unspecified: Secondary | ICD-10-CM | POA: Diagnosis not present

## 2015-10-14 ENCOUNTER — Telehealth: Payer: Self-pay

## 2015-10-14 NOTE — Telephone Encounter (Signed)
Nurse called patient, reached voicemail. Left message for patient to call [place] Mielke with [facility], at [phone]. Nurse called patient to see if he still needs vision referral.  This is second attempt to reach patient. Nurse will send letter.

## 2015-10-25 MED FILL — ?METOPROLOL 25 MG TABLET: 25 | 30 days supply | Qty: 30 | Fill #0

## 2015-11-28 MED FILL — METOPROLOL TARTRATE 25 MG T: 25 | 30 days supply | Qty: 30 | Fill #1

## 2015-12-29 MED FILL — METOPROLOL TARTRATE 25 MG T: 25 | 30 days supply | Qty: 30 | Fill #2

## 2016-01-25 MED FILL — METOPROLOL TARTRATE 25 MG T: 25 | 30 days supply | Qty: 30 | Fill #3

## 2016-02-22 MED FILL — METOPROLOL TARTRATE 25 MG T: 25 | 30 days supply | Qty: 30 | Fill #4

## 2016-02-28 ENCOUNTER — Other Ambulatory Visit: Payer: Self-pay | Admitting: Internal Medicine

## 2016-03-29 MED FILL — ?METOPROLOL 25 MG TABLET: 25 | 30 days supply | Qty: 30 | Fill #0

## 2016-03-30 ENCOUNTER — Other Ambulatory Visit: Payer: Self-pay | Admitting: Cardiology

## 2016-05-03 ENCOUNTER — Other Ambulatory Visit: Payer: Self-pay | Admitting: Internal Medicine

## 2016-05-04 ENCOUNTER — Other Ambulatory Visit: Payer: Self-pay | Admitting: Internal Medicine

## 2016-05-07 ENCOUNTER — Other Ambulatory Visit: Payer: Self-pay | Admitting: Internal Medicine

## 2016-05-07 ENCOUNTER — Telehealth: Payer: Self-pay | Admitting: Internal Medicine

## 2016-05-07 MED ORDER — METOPROLOL TARTRATE 25 MG PO TABS: 12.5000 mg | ORAL_TABLET | Freq: Two times a day (BID) | ORAL | Status: DC

## 2016-05-07 MED FILL — ?METOPROLOL 25 MG TABLET: 25 | 30 days supply | Qty: 30 | Fill #0

## 2016-05-07 NOTE — Telephone Encounter (Signed)
Refilled x 30 days. 

## 2016-05-07 NOTE — Telephone Encounter (Signed)
Pt. Came into facility requesting a refill on metoprolol tartrate (LOPRESSOR) 25 MG tablet.  Pt. Tried to schedule an appointment and was told to call back. He wants to see if he can get a  30 day supply. Please f/u

## 2016-05-08 ENCOUNTER — Other Ambulatory Visit: Payer: Self-pay | Admitting: Internal Medicine

## 2016-05-10 ENCOUNTER — Ambulatory Visit: Payer: Medicare Other | Attending: Internal Medicine | Admitting: Physician Assistant

## 2016-05-10 ENCOUNTER — Encounter: Payer: Self-pay | Admitting: Internal Medicine

## 2016-05-10 VITALS — BP 117/73 | HR 62 | Temp 98.3°F | Resp 16 | Ht 68.5 in | Wt 169.4 lb

## 2016-05-10 DIAGNOSIS — H269 Unspecified cataract: Secondary | ICD-10-CM | POA: Insufficient documentation

## 2016-05-10 DIAGNOSIS — Z7982 Long term (current) use of aspirin: Secondary | ICD-10-CM | POA: Diagnosis not present

## 2016-05-10 DIAGNOSIS — Z79899 Other long term (current) drug therapy: Secondary | ICD-10-CM | POA: Diagnosis not present

## 2016-05-10 DIAGNOSIS — I481 Persistent atrial fibrillation: Secondary | ICD-10-CM | POA: Insufficient documentation

## 2016-05-10 DIAGNOSIS — R2681 Unsteadiness on feet: Secondary | ICD-10-CM | POA: Insufficient documentation

## 2016-05-10 DIAGNOSIS — K029 Dental caries, unspecified: Secondary | ICD-10-CM | POA: Diagnosis not present

## 2016-05-10 DIAGNOSIS — R251 Tremor, unspecified: Secondary | ICD-10-CM | POA: Insufficient documentation

## 2016-05-10 DIAGNOSIS — R42 Dizziness and giddiness: Secondary | ICD-10-CM | POA: Insufficient documentation

## 2016-05-10 DIAGNOSIS — I4819 Other persistent atrial fibrillation: Secondary | ICD-10-CM

## 2016-05-10 LAB — CBC WITH DIFFERENTIAL/PLATELET
BASOS PCT: 0 %
Basophils Absolute: 0 cells/uL (ref 0–200)
EOS ABS: 106 {cells}/uL (ref 15–500)
Eosinophils Relative: 2 %
HEMATOCRIT: 41 % (ref 38.5–50.0)
HEMOGLOBIN: 14 g/dL (ref 13.2–17.1)
LYMPHS ABS: 636 {cells}/uL — AB (ref 850–3900)
LYMPHS PCT: 12 %
MCH: 31.3 pg (ref 27.0–33.0)
MCHC: 34.1 g/dL (ref 32.0–36.0)
MCV: 91.7 fL (ref 80.0–100.0)
MONO ABS: 636 {cells}/uL (ref 200–950)
MPV: 10.1 fL (ref 7.5–12.5)
Monocytes Relative: 12 %
NEUTROS PCT: 74 %
Neutro Abs: 3922 cells/uL (ref 1500–7800)
Platelets: 246 10*3/uL (ref 140–400)
RBC: 4.47 MIL/uL (ref 4.20–5.80)
RDW: 14 % (ref 11.0–15.0)
WBC: 5.3 10*3/uL (ref 3.8–10.8)

## 2016-05-10 LAB — COMPREHENSIVE METABOLIC PANEL
ALBUMIN: 3.6 g/dL (ref 3.6–5.1)
ALK PHOS: 75 U/L (ref 40–115)
ALT: 17 U/L (ref 9–46)
AST: 16 U/L (ref 10–35)
BUN: 21 mg/dL (ref 7–25)
CALCIUM: 9 mg/dL (ref 8.6–10.3)
CO2: 24 mmol/L (ref 20–31)
Chloride: 107 mmol/L (ref 98–110)
Creat: 1.11 mg/dL (ref 0.70–1.18)
GLUCOSE: 100 mg/dL — AB (ref 65–99)
POTASSIUM: 4.7 mmol/L (ref 3.5–5.3)
Sodium: 140 mmol/L (ref 135–146)
Total Bilirubin: 1.3 mg/dL — ABNORMAL HIGH (ref 0.2–1.2)
Total Protein: 6 g/dL — ABNORMAL LOW (ref 6.1–8.1)

## 2016-05-10 LAB — TSH: TSH: 2.36 mIU/L (ref 0.40–4.50)

## 2016-05-10 LAB — FOLATE: Folate: >24 ng/mL (ref >5.4)

## 2016-05-10 LAB — VITAMIN B12: Vitamin B-12: 988 pg/mL (ref 200–1100)

## 2016-05-10 MED ORDER — METOPROLOL TARTRATE 25 MG PO TABS: 12.5000 mg | ORAL_TABLET | Freq: Two times a day (BID) | ORAL | Status: DC

## 2016-05-10 NOTE — Progress Notes (Signed)
Patient ID: Sean Ward, male   DOB: [birth year], 75 y.o.   MRN: [id number]   Kalai Baca, is a 75 y.o. male  [id number]  [id number]  DOB - 1941/01/04  Subjective:  Chief Complaint and HPI: Sean Ward is a 75 y.o. male here today for progressively worsening tremor X 6 months.  He first noticed the tremor in his L arm and hand.  It has progressed and is now in B arms and his legs.  It is worse at rest.  He has also noticed that his gait is unsteady and he is off balance a lot.  He also c/o occasional dizziness.  He has not had any memory problems.  He is currently still driving and performing all ADL without assistance.  There have been no precipitating or alleviating factors. He is unable to do activities he normally enjoys such as tennis or cycling. He denies any med changes or any OTC supplements other than a multi-vitamin. He also wants a referral for an eye doctor and a dentist.    ROS:   Constitutional:  No f/c, No night sweats, No unexplained weight loss. EENT:  Needs glasses but denies acute/recent vision changes.  No hearing changes. No mouth, throat, or ear problems.  Respiratory: No cough, No SOB Cardiac: No CP, no palpitations GI:  No abd pain, No N/V/D. GU: No Urinary s/sx Musculoskeletal: No joint pain Neuro: No headache, + dizziness, no motor weakness.  Skin: No rash Endocrine:  No polydipsia. No polyuria.  Psych: Denies SI/HI  No problems updated.  ALLERGIES: No Known Allergies  PAST MEDICAL HISTORY: Past Medical History  Diagnosis Date  . History of bronchitis     MEDICATIONS AT HOME: Prior to Admission medications   Medication Sig Start Date End Date Taking? Authorizing Provider  aspirin EC 325 MG tablet Take 1 tablet (325 mg total) by mouth daily. 10/25/14  Yes Quentin Angst, MD  ibuprofen (ADVIL,MOTRIN) 200 MG tablet Take 200-600 mg by mouth every 6 (six) hours as needed. pain   Yes Historical Provider, MD  metoprolol tartrate  (LOPRESSOR) 25 MG tablet Take 0.5 tablets (12.5 mg total) by mouth 2 (two) times daily. 05/10/16  Yes Anders Simmonds, PA-C  Multiple Vitamin (MULTIVITAMIN) tablet Take 1 tablet by mouth daily.    Historical Provider, MD     Objective:  EXAM:   Filed Vitals:   05/10/16 0955  BP: 117/73  Pulse: 62  Temp: 98.3 F (36.8 C)  TempSrc: Oral  Resp: 16  Height: 5' 8.5" (1.74 m)  Weight: 169 lb 6.4 oz (76.839 kg)  SpO2: 97%    General appearance : A&OX3. NAD. Non-toxic-appearing.  Visible tremor; worse on L; worse at rest.  Decreases with intention.  Walks slowly but with normal gait and ambulation.  No shuffling or cogwheeling.  Unable to perform heel or tip-toe walking. HEENT: Atraumatic and Normocephalic.  PERRLA. EOM intact.  TM clear B. Mouth-MMM, post pharynx WNL w/o erythema, No PND. Neck: supple, no JVD. No cervical lymphadenopathy. No thyromegaly Chest/Lungs:  Breathing-non-labored, Good air entry bilaterally, breath sounds normal without rales, rhonchi, or wheezing  CVS: S1 S2 regular, no murmurs, gallops, rubs  Extremities: Bilateral Lower Ext shows no edema, both legs are warm to touch with = pulse throughout Neurology:  Noted tremor of B arms/hands; L>R.  B legs restless.  No pill-rolling tremor.  Finger to nose performed and tremor persisted with diminished intensity over tremor at rest.  Able to perform heel to shin.  CN II-XII grossly intact, Non focal.  Questionable Rhomberg. DTR=Throughout.  Psych:  TP linear. J/I WNL. Normal speech. Appropriate eye contact and affect.  Skin:  No Rash   Discussed with Dr. Hyman HopesJegede  Assessment & Plan   1. Tremor New onset X 6 months - Comprehensive metabolic panel - TSH - Vitamin B12 - Folate - CBC with Differential/Platelet - Ambulatory referral to Neurology[facility] 10 am, on August 8th, 2017.    2. Dizziness and giddiness X 6months - Comprehensive metabolic panel - TSH - Vitamin B12 - Folate -  CBC with Differential/Platelet - Ambulatory referral [facility]rology[facility] 10 am, on August 8th, 2017.   3. Unsteady gait - Comprehensive metabolic panel - TSH - Vitamin B12 - Folate - CBC with Differential/Platelet - Ambulatory referral to Neurology[facility] at 10 am, on August 8th, 2017.   4. 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HCC) Stable/continue - metoprolol tartrate (LOPRESSOR) 25 MG tablet; Take 0.5 tablets (12.5 mg total) by mouth 2 (two) times daily.  Dispense: 60 tablet; Refill: 2  5. Cataracts, bilateral - Ambulatory referral to Ophthalmology  6. Dental caries - Ambulatory referral to Dentistry   Patient have been counseled extensively about nutrition and exercise  Return in about 4 weeks (around 06/07/2016) for f/up with Dr Hyman HopesJegede.  The patient was given clear instructions to go to ER or return to medical center if symptoms don't improve, worsen or new problems develop. The patient verbalized understanding. The patient was told to call to get lab results if they haven't heard anything in the next week.     Georgian CoAngela Chara Marquard, PA-C [facility] [place], [place] [phone]   05/10/2016, 1:55 PM

## 2016-05-10 NOTE — Patient Instructions (Signed)
[facility] have an [facility] at 10 am, on August 8th.  Be there at 9:30 am for paper work.

## 2016-05-10 NOTE — Progress Notes (Signed)
Patient here for right knee pain x 1 year. Tried/failed ibuprofen.Needs refill of Metoprolol 25 mg, been out for 5 days. Experiencing tremors x 6 months. Also having balance issues x 1 month. Intermittent low back pain.   Requesting referral to dentist/eye Dr.Kim Mort SawyersBecton, RN, BSN

## 2016-05-10 NOTE — Addendum Note (Signed)
Addended by: Yancey FlemingsBECTON, Norman Piacentini R on: 05/10/2016 04:50 PM   Modules accepted: Kipp BroodSmartSet

## 2016-05-11 ENCOUNTER — Telehealth: Payer: Self-pay

## 2016-05-11 NOTE — Telephone Encounter (Signed)
-----   Message from Anders SimmondsAngela M McClung, [place] sent at 05/11/2016  9:11 AM EDT ----- Please call patient.  All of his labs are normal and do not show a source for the tremor.  Keep the neurology appointment on Aug 8th at 10am.   Thanks, Georgian CoAngela McClung, PA-C

## 2016-05-11 NOTE — Telephone Encounter (Signed)
Clld pt - LMOVMTC re lab results. 

## 2016-05-15 NOTE — Telephone Encounter (Signed)
Clld pt - LMOVMTC re lab results. 

## 2016-05-15 NOTE — Telephone Encounter (Signed)
-----   Message from Anders Simmonds, [place] sent at 05/11/2016  9:11 AM EDT ----- Please call patient.  All of his labs are normal and do not show a source for the tremor.  Keep the neurology appointment on Aug 8th at 10am.   Thanks, Georgian Co, PA-C

## 2016-05-21 NOTE — Progress Notes (Signed)
Multiple messages left but pt has not returned calls. Will mail lab results letter.

## 2016-05-24 ENCOUNTER — Ambulatory Visit: Payer: Medicare Other | Attending: Internal Medicine | Admitting: Internal Medicine

## 2016-05-24 ENCOUNTER — Encounter: Payer: Self-pay | Admitting: Internal Medicine

## 2016-05-24 VITALS — BP 122/69 | HR 58 | Temp 98.3°F | Resp 18 | Ht 68.0 in | Wt 171.2 lb

## 2016-05-24 DIAGNOSIS — Z7982 Long term (current) use of aspirin: Secondary | ICD-10-CM | POA: Diagnosis not present

## 2016-05-24 DIAGNOSIS — Z791 Long term (current) use of non-steroidal anti-inflammatories (NSAID): Secondary | ICD-10-CM | POA: Insufficient documentation

## 2016-05-24 DIAGNOSIS — R251 Tremor, unspecified: Secondary | ICD-10-CM | POA: Diagnosis not present

## 2016-05-24 DIAGNOSIS — M7989 Other specified soft tissue disorders: Secondary | ICD-10-CM | POA: Diagnosis not present

## 2016-05-24 DIAGNOSIS — R2681 Unsteadiness on feet: Secondary | ICD-10-CM | POA: Diagnosis not present

## 2016-05-24 DIAGNOSIS — M1711 Unilateral primary osteoarthritis, right knee: Secondary | ICD-10-CM | POA: Diagnosis not present

## 2016-05-24 DIAGNOSIS — Z79899 Other long term (current) drug therapy: Secondary | ICD-10-CM | POA: Diagnosis not present

## 2016-05-24 DIAGNOSIS — I480 Paroxysmal atrial fibrillation: Secondary | ICD-10-CM | POA: Diagnosis not present

## 2016-05-24 DIAGNOSIS — K4020 Bilateral inguinal hernia, without obstruction or gangrene, not specified as recurrent: Secondary | ICD-10-CM | POA: Diagnosis not present

## 2016-05-24 MED ORDER — PROPRANOLOL HCL ER 60 MG PO CP24: 60.0000 mg | ORAL_CAPSULE | Freq: Every day | ORAL | 0 refills | Status: DC

## 2016-05-24 MED ORDER — NAPROXEN 500 MG PO TABS: 500.0000 mg | ORAL_TABLET | Freq: Two times a day (BID) | ORAL | 2 refills | Status: DC

## 2016-05-24 MED FILL — PROPRANOLOL ER 60 MG CAP: 60 | 30 days supply | Qty: 30 | Fill #0

## 2016-05-24 MED FILL — NAPROXEN 500 MG TABLET: 500 | 15 days supply | Qty: 30 | Fill #0

## 2016-05-24 NOTE — Progress Notes (Signed)
Patient is here for FU leg Edema  Patient complains of right knee swelling and pain. Pain has been present for 6 months and is described as sharp constant pain.  Patient complains of tremors progressing over the past 6 months. Tremor began in the left hand and is now all over the patients body.  Patient has taken medication today and patient has eaten today.

## 2016-05-24 NOTE — Progress Notes (Signed)
Sean Ward, is a 75 y.o. male  WUJ:[id number]  NFA:[id number]  DOB - 1941/06/14  Chief Complaint  Patient presents with  . Leg Swelling    right      Subjective:   Sean Ward is a 75 y.o. male with history of dependent bilateral lower limb edema, paroxysmal atrial fibrillation on aspirin only, osteoarthritis and bilateral inguinal hernia (patient has not yet have surgery) recently seen here in the clinic for progressively worsening tremors x over 6 months, here today for a follow up visit. Patient has been referred to the neurologist, he has an appointment coming up in about a week. He continues to have disturbing tremor in both upper limbs, it first started on the left and now on the right. He lives alone in an apartment here in [place], and has to navigate one flight of stairs. He fell once about 3 months ago, in his apartment onto his L side, no head injury, no LOC, sleeps more during the day in the afternoon. He has one daughter and one son, both live in [place], [place]. He is a non-smoker, no FHx of tremors, or PD. He has no significant memory lapse but he is concerned about this tremor. He is very anxious about what it could be. He does not want it to be Parkinson's Disease. Patient has No headache, No chest pain, No abdominal pain - No Nausea. There is no associated weakness or numbness. No difficulty talking.  Problem  Unsteady Gait  Tremor   ALLERGIES: No Known Allergies  PAST MEDICAL HISTORY: Past Medical History:  Diagnosis Date  . History of bronchitis     MEDICATIONS AT HOME: Prior to Admission medications   Medication Sig Start Date End Date Taking? Authorizing Provider  aspirin EC 325 MG tablet Take 1 tablet (325 mg total) by mouth daily. 10/25/14  Yes Quentin Angst, MD  Multiple Vitamin (MULTIVITAMIN) tablet Take 1 tablet by mouth daily.   Yes Historical Provider, MD  naproxen (NAPROSYN) 500 MG tablet Take 1 tablet (500 mg total) by mouth 2 (two) times  daily with a meal. 05/24/16   Quentin Angst, MD  propranolol ER (INDERAL LA) 60 MG 24 hr capsule Take 1 capsule (60 mg total) by mouth daily. 05/24/16   Quentin Angst, MD    Objective:   Vitals:   05/24/16 0947  BP: 122/69  Pulse: (!) 58  Resp: 18  Temp: 98.3 F (36.8 C)  TempSrc: Oral  SpO2: 99%  Weight: 171 lb 3.2 oz (77.7 kg)  Height:  (1.727 m)   Exam General appearance : Awake, alert, not in any distress. Speech Clear. Not toxic looking HEENT: Atraumatic and Normocephalic, pupils equally reactive to light and accomodation Neck: Supple, no JVD. No cervical lymphadenopathy.  Chest: Good air entry bilaterally, no added sounds  CVS: S1 S2 regular, no murmurs.  Abdomen: Bowel sounds present, Non tender and not distended with no gaurding, rigidity or rebound. Extremities: B/L Lower Ext shows no edema, both legs are warm to touch Neurology: Awake alert, and oriented X 3, CN II-XII intact, Non focal. Tremors all limbs. There is no truncal or gait ataxia, Gait is slightly shuffling   Data Review  Assessment & Plan   1. 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  Dispense: 30 capsule; Refill: 0  2. Unsteady gait  Follow up with Neurologist on 05/29/2016  3. Primary osteoarthritis of right knee  - naproxen (NAPROSYN) 500 MG tablet; Take 1 tablet (500 mg total) by mouth 2 (two) times daily with a meal.  Dispense: 30 tablet; Refill: 2  Patient have been counseled extensively about nutrition and exercise  Return in about 3 months (around 08/24/2016) for Follow up HTN, Tremor.  The patient was given clear instructions to go to ER or return to medical center if symptoms don't improve, worsen or new problems develop. The patient verbalized understanding. The patient was told to call to get lab results if they haven't heard  anything in the next week.   This note has been created with Education officer, environmental. Any transcriptional errors are unintentional.    Jeanann Lewandowsky, MD, MHA, Maxwell Caul, CPE [facility] [place], [place] [phone]   05/24/2016, 10:36 AM

## 2016-05-24 NOTE — Patient Instructions (Signed)
Tremor °A tremor is trembling or shaking that you cannot control. Most tremors affect the hands or arms. Tremors can also affect the head, vocal cords, face, and other parts of the body. There are many types of tremors. Common types include:  °· Essential tremor. These usually occur in people over the age of 40. It may run in families and can happen in otherwise healthy people.   °· Resting tremor. These occur when the muscles are at rest, such as when your hands are resting in your lap. People with Parkinson disease often have resting tremors.   °· Postural tremor. These occur when you try to hold a pose, such as keeping your hands outstretched.   °· Kinetic tremor. These occur during purposeful movement, such as trying to touch a finger to your nose.   °· Task-specific tremor. These may occur when you perform tasks such as handwriting, speaking, or standing.   °· Psychogenic tremor. These dramatically lessen or disappear when you are distracted. They can happen in people of all ages.   °Some types of tremors have no known cause. Tremors can also be a symptom of nervous system problems (neurological disorders) that may occur with aging. Some tremors go away with treatment while others do not.  °HOME CARE INSTRUCTIONS °Watch your tremor for any changes. The following actions may help to lessen any discomfort you are feeling: °· Take medicines only as directed by your health care provider.   °· Limit alcohol intake to no more than 1 drink per day for nonpregnant women and 2 drinks per day for men. One drink equals 12 oz of beer, 5 oz of wine, or 1½ oz of hard liquor. °· Do not use any tobacco products, including cigarettes, chewing tobacco, or electronic cigarettes. If you need help quitting, ask your health care provider.   °· Avoid extreme heat or cold.    °· Limit the amount of caffeine you consume as directed by your health care provider.   °· Try to get 8 hours of sleep each night. °· Find ways to manage your  stress, such as meditation or yoga. °· Keep all follow-up visits as directed by your health care provider. This is important. °SEEK MEDICAL CARE IF: °· You start having a tremor after starting a new medicine. °· You have tremor with other symptoms such as: °¨ Numbness. °¨ Tingling. °¨ Pain. °¨ Weakness. °· Your tremor gets worse. °· Your tremor interferes with your day-to-day life. °  °This information is not intended to replace advice given to you by your health care provider. Make sure you discuss any questions you have with your health care provider. °  °Document Released: 09/28/2002 Document Revised: 10/29/2014 Document Reviewed: 04/05/2014 °Elsevier Interactive Patient Education ©2016 [employer] ° °

## 2016-05-29 ENCOUNTER — Ambulatory Visit: Payer: Medicare Other | Admitting: Neurology

## 2016-05-29 ENCOUNTER — Telehealth: Payer: Self-pay

## 2016-05-29 NOTE — Telephone Encounter (Signed)
Patient showed to appt after appt time.

## 2016-06-06 ENCOUNTER — Encounter: Payer: Self-pay | Admitting: Neurology

## 2016-06-06 ENCOUNTER — Ambulatory Visit (INDEPENDENT_AMBULATORY_CARE_PROVIDER_SITE_OTHER): Payer: Medicare Other | Admitting: Neurology

## 2016-06-06 VITALS — BP 136/70 | HR 70 | Resp 14 | Ht 68.0 in | Wt 171.0 lb

## 2016-06-06 DIAGNOSIS — G20 Parkinson's disease: Secondary | ICD-10-CM

## 2016-06-06 MED ORDER — CARBIDOPA-LEVODOPA 25-100 MG PO TABS: ORAL_TABLET | ORAL | 5 refills | Status: DC

## 2016-06-06 MED FILL — NAPROXEN 500 MG TABLET: 500 | 15 days supply | Qty: 30 | Fill #1

## 2016-06-06 MED FILL — CARBIDOPA-LEVODOPA 25-100 T: 25-100 | 30 days supply | Qty: 90 | Fill #0

## 2016-06-06 NOTE — Patient Instructions (Addendum)
I think you have signs and symptoms of Parkinson's disease, with more left sided findings. As explained, there are medication treatment options, but this disease does progress with time. It can affect your balance, your memory, your mood, your bowel and bladder function, your posture, balance and walking and your activities of daily living. However, there are good supportive treatments and symptomatic treatments available, so most patients have a change to a good quality life and life expectancy is not typically altered. Overall you are doing fairly healthwise, but I do want to suggest a few things today:  Remember to drink plenty of fluid at least 6 glasses (8 oz each), eat healthy meals and do not skip any meals. Try to eat protein with a every meal and eat a healthy snack such as fruit or nuts in between meals. Try to keep a regular sleep-wake schedule and try to exercise daily, particularly in the form of walking, 20-30 minutes a day, if you can.   Please make another appointment with cardiology.   Taking your medication on schedule is key.   Try to stay active physically and mentally. Engage in social activities in your community and with your family and try to keep up with current events by reading the newspaper or watching the news. Try to do word puzzles and you may like to do puzzles and brain games on the computer such as on [email or web address].   As far as your medications are concerned, I would like to suggest that you take your current medication with the following additional changes: Sinemet (generic name: carbidopa-levodopa) 25/100 mg: Take half a pill twice daily (8 AM and noon) for one week, then half a pill 3 times a day (8 AM, noon, and 4 PM) for one week, then one pill 3 times a day thereafter. Please try to take the medication away from you mealtimes, that is, ideally either one hour before or 2 hours after your meal to ensure optimal absorption. The medication can interfere with the protein  content of your meal and trying to the protein in your food and therefore not get fully absorbed.   Common side effects reported are: Nausea, vomiting, sedation, confusion, lightheadedness. Rare side effects include hallucinations, severe nausea or vomiting, diarrhea and significant drop in blood pressure especially when going from lying to standing or from sitting to standing.   As far as diagnostic testing, I will order: a brain scan, called MRI and call you with the test results. We will have to schedule you for this on a separate date. This test requires authorization from your insurance, and we will take care of the insurance process.  I would like to see you back in 3 months, sooner if we need to. Please call us with any interim questions, concerns, problems, updates or refill requests.  Our phone number is [phone]. We also have an after hours call service for urgent matters and there is a physician on-call for urgent questions, that cannot wait till the next work day. For any emergencies you know to call 911 or go to the nearest emergency room.   You can email me through my chart and also leave a phone message for Lafonda Mossesiana, my nurse.

## 2016-06-06 NOTE — Progress Notes (Signed)
Subjective:    Patient ID: Sean Ward is a 75 y.o. male.  HPI     Sean FoleySaima Kaleesi Guyton, MD, PhD [facility] [address], [address] [address] [place], [place] [address]  Dear Dr. Hyman Ward,   I saw your patient, Sean Ward, upon your kind request in my neurologic clinic today for initial consultation of his tremors and gait disorder. The patient is unaccompanied today. As you know, Sean Ward is a 75 year old right-handed gentleman with an underlying medical history of bronchitis, PAF (for which he saw Sean Ward in cardiology) and osteoarthritis, who has had an upper extremity tremor for the past 6-12 months, unclear exactly how long and tremor started on the L, now also on the R side about a month ago. Leg tremors started about 2-3 months ago. 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 I reviewed your office note from 05/24/2016. You discontinued metoprolol and started him on propranolol long-acting 60 mg once daily. He is physically active, but not so much lately. He plays tennis and rides a bike, but not able to lately.  He was a [employer], still does some contract work.  He drinks wine with dinner, 1-2 glasses. Tremor is less post alcohol. He drinks coffee about 3 cups per day, otherwise water, about 8 glasses.  He has swelling of both LEs, for the past 6+ months, was on Lasix, but no longer. He has been on a baby ASA.  He lives alone in an apartment and has to navigate the one flight of stairs. Larey SeatFell once about 3 months ago, in his apartment onto his L side, no head injury, no LOC, sleeps more during the day in the afternoon, which helps. He is separated (going through divorce), has one daughter and one son, both live in [place], [place].  He is a non-smoker, no FHx of tremors, or PD. He feels like he has a tendency to fall backwards. He has only fallen once in the past year, onto his left side, thankfully no injury as mentioned. Memory is fairly good, he denies  any mood related issues, he does worry about his tremor. He is planning an overseas trip by the end of September.    His Past Medical History Is Significant For: Past Medical History:  Diagnosis Date  . History of bronchitis     His Past Surgical History Is Significant For: Past Surgical History:  Procedure Laterality Date  . HERNIA REPAIR    . NOne    . TONSILLECTOMY      His Family History Is Significant For: No family history on file.  His Social History Is Significant For: Social History   Social History  . Marital status: Legally Separated    Spouse name: N/A  . Number of children: 2  . Years of education: BA   Social History Main Topics  . Smoking status: Never Smoker  . Smokeless tobacco: Never Used  . Alcohol use 4.2 oz/week    7 Glasses of wine per week     Comment: 1 glass of red wine with dinner  . Drug use: No  . Sexual activity: No   Other Topics Concern  . None   Social History Narrative   Drinks 2 cups of coffee a day     His Allergies Are:  No Known Allergies:   His Current Medications Are:  Outpatient Encounter Prescriptions as of 06/06/2016  Medication Sig  . Multiple Vitamin (MULTIVITAMIN) tablet Take 1 tablet by mouth  daily.  . naproxen (NAPROSYN) 500 MG tablet Take 1 tablet (500 mg total) by mouth 2 (two) times daily with a meal.  . propranolol ER (INDERAL LA) 60 MG 24 hr capsule Take 1 capsule (60 mg total) by mouth daily.  . [DISCONTINUED] aspirin EC 325 MG tablet Take 1 tablet (325 mg total) by mouth daily.   No facility-administered encounter medications on file as of 06/06/2016.   : Review of Systems:  Out of a complete 14 point review of systems, all are reviewed and negative with the exception of these symptoms as listed below:  Review of Systems  Neurological:       Patient reports that he started having tremors in his L hand about 6 months ago. Now has tremors in both.  Decreased balance.     Objective:  Neurologic  Exam  Physical Exam Physical Examination:   Vitals:   06/06/16 0821  BP: 136/70  Pulse: 70  Resp: 14   General Examination: The patient is a very pleasant 75 y.o. male in no acute distress.  HEENT: Normocephalic, atraumatic, pupils are equal, round and reactive to light and accommodation. Funduscopic exam is normal with sharp disc margins noted. Extraocular tracking shows mild saccadic breakdown without nystagmus noted. There is limitation to upper gaze. There is mild decrease in eye blink rate. He has a droopy left eyelid, not new per patient. Hearing is grossly intact. Face is symmetric with mild facial masking and normal facial sensation. There is no lip, neck or jaw tremor. Neck is moderately rigid with mild decrease in passive range of motion. He has an intermittent lower jaw and lip tremor. Oropharynx exam reveals mild mouth dryness. No overt sialorrhea is noted.   Chest: is clear to auscultation without wheezing, rhonchi or crackles noted.  Heart: sounds are regular and normal without murmurs, rubs or gallops noted.   Abdomen: is soft, non-tender and non-distended with normal bowel sounds appreciated on auscultation.  Extremities: There is 1+ pitting edema in the distal lower extremities bilaterally, left a little bit worse than right with chronic stasis-like changes seen, he has no significant varicose veins.   Skin: is warm and dry with no trophic changes noted. Age-related changes are noted on the skin.   Musculoskeletal: exam reveals no obvious joint deformities, tenderness, joint swelling or erythema.  Neurologically:  Mental status: The patient is awake and alert, paying good  attention. He is able to completely provide the history.however, he is not very detailed with respect to timeline. He is fully oriented. He has no aphasia, agnosia, apraxia or anomia, no significant bradyphrenia. Speech is mildly hypophonic with no dysarthria noted. Mood is congruent and affect is  normal.   Cranial nerves are as described above under HEENT exam. In addition, shoulder shrug is normal with unequal shoulder height noted, left side higher than right.  Motor exam: Normal bulk, and strength for age is noted. There are no dyskinesias noted.  Tone is mildly rigid with presence of cogwheeling in the left upper extremity. There is overall moderate bradykinesia. There is no drift or rebound.  There ismild to moderate left-sided resting tremor in the upper extremity and lower extremity, a mild resting tremor in the right upper extremity and minimal in the right lower extremity. Fine motor skills are moderately impaired on the left with respect to finger taps, hand movements, rapid alternating patting, foot taps and foot agility, mild to moderate impairment noted on the right.  Archimedes spiral drawing shows mild degree  of tremulousness on the right and worse on the left. Handwriting is tremulous, micrographic and still legible.  Romberg is negative.  Reflexes are 1+ in the upper extremities and 1+ in the lower extremities. Toes are downgoing bilaterally.  Cerebellar testing shows no dysmetria or intention tremor on finger to nose testing. Heel to shin is unremarkable bilaterally. There is no truncal or gait ataxia.   Sensory exam is intact to light touch, pinprick, vibration, temperature sense and proprioception in the upper and lower extremities.   Gait, station and balance: he sits in the chair, leaning slightly to the right, when standing he has to push himself up and stands up slowly, slightly into the right noted, left shoulder higher than right, he walks with decreased stride length and decrease pace, decreased arm swing bilaterally, actually near absent arm swing on the left. He turns in 3 steps. He is slightly insecure with his walk and balance is very mildly impaired. Tandem walk is not possible for him.   Assessment and Plan:  Assessment and Plan:  In summary, Sean Ward  is a very pleasant 75 y.o.-year old male with an underlying medical history of bronchitis, PAF (for which he saw Dr. 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 He has bilateral findings, lateralization to the left, tremor predominance. He has fairly classic findings. Nevertheless, I would like to proceed with a brain MRI without contrast. He denies any history of TIA or strokes. He has been fairly healthy for a long time and tries to take care of himself, tries to stay well-hydrated and physically active but had to scale down his physical activity because of his tremor impairment, fine motor skills impairment and mild balance disorder. I had a long discussion with the patient about His symptoms, my findings and the diagnosis of parkinsonism/Parksinson's disease, its prognosis and treatment options. We talked about medical treatments and non-pharmacological approaches. We talked about maintaining a healthy lifestyle in general. I encouraged the patient to eat healthy, exercise daily and keep well hydrated, to keep a scheduled bedtime and wake time routine, to not skip any meals and eat healthy snacks in between meals and to have protein with every meal. In particular, I stressed the importance of regular exercise, within of course the patient's own mobility limitations.   As far as further diagnostic testing is concerned, I suggested: brain MRI without contrast, we will call him with his test results.    As far as medications are concerned, I recommended the following at this time: I suggested he continue his current medications, he is advised to make a follow-up appointment with cardiology as it has been over a year since his last checkup, he can continue with propranolol unless cardiology wants to change it back to metoprolol. From my end of things, I suggested we start him on Sinemet with  gradual titration and low starting dose. We talked about potential side effects. We will start with 25-100 milligrams strength half a pill twice a day with titration gradually to 1 pill 3 times a day. I provided written instructions and a new prescription. I will see him back in 3 months, sooner if needed. He strongly encouraged to call or email with any interim questions or concerns. I answered all his questions today and he was in agreement with the plan. Thank you very much for allowing me to participate in the care of this nice patient. If  I can be of any further assistance to you please do not hesitate to call me at [phone].  Sincerely,   Star Age, MD, PhD

## 2016-06-11 ENCOUNTER — Ambulatory Visit: Payer: Medicare Other | Admitting: Neurology

## 2016-06-15 DIAGNOSIS — H25811 Combined forms of age-related cataract, right eye: Secondary | ICD-10-CM | POA: Diagnosis not present

## 2016-06-15 DIAGNOSIS — H40013 Open angle with borderline findings, low risk, bilateral: Secondary | ICD-10-CM | POA: Diagnosis not present

## 2016-06-15 DIAGNOSIS — H04123 Dry eye syndrome of bilateral lacrimal glands: Secondary | ICD-10-CM | POA: Diagnosis not present

## 2016-06-15 DIAGNOSIS — H2512 Age-related nuclear cataract, left eye: Secondary | ICD-10-CM | POA: Diagnosis not present

## 2016-06-18 ENCOUNTER — Ambulatory Visit
Admission: RE | Admit: 2016-06-18 | Discharge: 2016-06-18 | Disposition: A | Discharge status: Home / Self Care | Payer: Medicare Other | Source: Ambulatory Visit | Attending: Neurology | Admitting: Neurology

## 2016-06-18 DIAGNOSIS — G20 Parkinson's disease: Secondary | ICD-10-CM

## 2016-06-18 NOTE — Progress Notes (Signed)
Please call patient regarding the recent brain MRI: The brain scan showed a normal structure of the brain and no significant volume loss which we call atrophy. There were changes in the deeper structures of the brain, which we call white matter changes or microvascular changes. These were reported as mild in His case. These are tiny white spots, that occur with time and are seen in a variety of conditions, including with normal aging, chronic hypertension, chronic headaches, especially migraine HAs, chronic diabetes, chronic hyperlipidemia. These are not strokes and no mass or lesion were seen which is reassuring. Again, there were no acute findings, such as a stroke, or mass or blood products. No further action is required on this test at this time, other than re-enforcing the importance of good blood pressure control, good cholesterol control, good blood sugar control, and weight management. Please remind patient to keep any upcoming appointments or tests and to call us with any interim questions, concerns, problems or updates. Thanks,  Huston FoleySaima Cristoval Teall, MD, PhD

## 2016-06-19 ENCOUNTER — Telehealth: Payer: Self-pay

## 2016-06-19 NOTE — Telephone Encounter (Signed)
-----   Message from Huston FoleySaima Athar, MD sent at 06/18/2016  5:14 PM EDT ----- Please call patient regarding the recent brain MRI: The brain scan showed a normal structure of the brain and no significant volume loss which we call atrophy. There were changes in the deeper structures of the brain, which we call white matter changes or microvascular changes. These were reported as mild in His case. These are tiny white spots, that occur with time and are seen in a variety of conditions, including with normal aging, chronic hypertension, chronic headaches, especially migraine HAs, chronic diabetes, chronic hyperlipidemia. These are not strokes and no mass or lesion were seen which is reassuring. Again, there were no acute findings, such as a stroke, or mass or blood products. No further action is required on this test at this time, other than re-enforcing the importance of good blood pressure control, good cholesterol control, good blood sugar control, and weight management. Please remind patient to keep any upcoming appointments or tests and to call us with any interim questions, concerns, problems or updates. Thanks,  Huston FoleySaima Athar, MD, PhD

## 2016-06-19 NOTE — Telephone Encounter (Signed)
I spoke to patient and he is aware of results. He asked to come in sooner to discuss tremors. He does not feel that medication is helping. I made appt for 9/28 but will keep him on wait list for any cancellations.

## 2016-06-20 MED FILL — NAPROXEN 500 MG TABLET: 500 | 15 days supply | Qty: 30 | Fill #2

## 2016-06-21 ENCOUNTER — Other Ambulatory Visit: Payer: Self-pay | Admitting: Internal Medicine

## 2016-06-21 DIAGNOSIS — H40013 Open angle with borderline findings, low risk, bilateral: Secondary | ICD-10-CM | POA: Diagnosis not present

## 2016-06-21 DIAGNOSIS — H2512 Age-related nuclear cataract, left eye: Secondary | ICD-10-CM | POA: Diagnosis not present

## 2016-06-21 DIAGNOSIS — H04123 Dry eye syndrome of bilateral lacrimal glands: Secondary | ICD-10-CM | POA: Diagnosis not present

## 2016-06-21 DIAGNOSIS — R251 Tremor, unspecified: Secondary | ICD-10-CM

## 2016-06-21 DIAGNOSIS — H25811 Combined forms of age-related cataract, right eye: Secondary | ICD-10-CM | POA: Diagnosis not present

## 2016-06-21 MED FILL — PROPRANOLOL ER 60 MG CAP: 60 | 30 days supply | Qty: 30 | Fill #0

## 2016-06-26 NOTE — Telephone Encounter (Signed)
I spoke to patient to check on him. He requested to come back in to see Dr. Frances FurbishAthar before he left to go out of town. I also had an opening for tomorrow if patient wanted to take it. He states that he is now on the Sinemet 1 tab 3 times and day and is doing "ok". I made sure that he is not taking it around meal times. Patient felt that he can wait for his appt at the end of the month. He stated that it was not necessary to come in tomorrow. I advised him to call us back he he needs us before then.

## 2016-07-13 MED FILL — CARBIDOPA-LEVODOPA 25-100 T: 25-100 | 30 days supply | Qty: 90 | Fill #1

## 2016-07-18 ENCOUNTER — Other Ambulatory Visit: Payer: Self-pay | Admitting: Internal Medicine

## 2016-07-18 ENCOUNTER — Telehealth: Payer: Self-pay

## 2016-07-18 DIAGNOSIS — M1711 Unilateral primary osteoarthritis, right knee: Secondary | ICD-10-CM

## 2016-07-18 NOTE — Telephone Encounter (Signed)
I LM for patient to call us back and reschedule appt tomorrow.Provider out sick.

## 2016-07-19 ENCOUNTER — Ambulatory Visit: Payer: Medicare Other | Admitting: Neurology

## 2016-07-19 ENCOUNTER — Ambulatory Visit: Payer: Self-pay | Admitting: Neurology

## 2016-07-24 ENCOUNTER — Encounter: Payer: Self-pay | Admitting: Neurology

## 2016-07-24 ENCOUNTER — Ambulatory Visit (INDEPENDENT_AMBULATORY_CARE_PROVIDER_SITE_OTHER): Payer: Medicare Other | Admitting: Neurology

## 2016-07-24 VITALS — BP 124/68 | HR 78 | Resp 20 | Ht 68.0 in | Wt 176.0 lb

## 2016-07-24 DIAGNOSIS — G20 Parkinson's disease: Secondary | ICD-10-CM

## 2016-07-24 MED ORDER — CARBIDOPA-LEVODOPA 25-100 MG PO TABS: 1.5000 | ORAL_TABLET | Freq: Three times a day (TID) | ORAL | 5 refills | Status: DC

## 2016-07-24 MED FILL — CARBIDOPA-LEVODOPA 25-100 T: 25-100 | 33 days supply | Qty: 150 | Fill #0

## 2016-07-24 NOTE — Patient Instructions (Addendum)
Let's increase your Sinemet you 1 1/2 pills 3 times a day, at 9 AM, 2 PM and 7 PM daily.   Brain scan looked good for age. No evidence of stroke or injuries.   Exercise is of critical importance.   Try to hydrate well with water.   Try to eat more fish and chicken.

## 2016-07-24 NOTE — Progress Notes (Signed)
Subjective:    Patient ID: Sean Ward is a 75 y.o. male.  HPI     Interim history:  Mr. Sean Ward is a 75 year old right-handed gentleman with an underlying medical history of bronchitis, PAF (for which he saw Dr. 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 The patient is unaccompanied today. I first met him on 06/06/2016 at the request of his primary care physician, at which time he reported an upper extremity tremor on the left frontal past 6-12 months and more recently in the right upper extremity. On examination, he had signs and keeping with parkinsonism, with left-sided predominance. I suggested we start him on Sinemet 25-100 milligrams strength half a pill twice a day with gradual titration to 1 pill 3 times a day. I ordered a brain MRI wo contrast. He had this on 06/18/16: IMPRESSION:  This MRI of the brain without contrast shows the following: 1.    Scattered T2/FLAIR hyperintense foci in the subcortical, deep and periventricular white matter in a pattern most consistent with mild chronic microvascular ischemic change.   None of the foci appears to be acute. 2.    There are no acute findings.  We called him with the results.   Today, 07/24/2016: He reports tolerating the sinemet. Feels, the tremor is less. He skipped a day once, as the pharmacy has a 48 hour refill policy. He is traveling to [place] later this month, for 6 days. He has had no falls. Has not been exercising, stable mood and memory.   Previously:   06/06/2016: He has had an upper extremity tremor for the past 6-12 months, unclear exactly how long and tremor started on the L, now also on the R side about a month ago. Leg tremors started about 2-3 months ago. 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 I reviewed your office note from 05/24/2016. You discontinued metoprolol and started him on propranolol long-acting 60 mg once daily. He is physically active, but not so  much lately. He plays tennis and rides a bike, but not able to lately.  He was a Human resources officer, still does some contract work.  He drinks wine with dinner, 1-2 glasses. Tremor is less post alcohol. He drinks coffee about 3 cups per day, otherwise water, about 8 glasses.  He has swelling of both LEs, for the past 6+ months, was on Lasix, but no longer. He has been on a baby ASA.  He lives alone in an apartment and has to navigate the one flight of stairs. [place] once about 3 months ago, in his apartment onto his L side, no head injury, no LOC, sleeps more during the day in the afternoon, which helps. He is separated (going through divorce), has one daughter and one son, both live in [place], [place].  He is a non-smoker, no FHx of tremors, or PD. He feels like he has a tendency to fall backwards. He has only fallen once in the past year, onto his left side, thankfully no injury as mentioned. Memory is fairly good, he denies any mood related issues, he does worry about his tremor. He is planning an overseas trip by the end of September.  His Past Medical History Is Significant For: Past Medical History:  Diagnosis Date  . History of bronchitis     Her Past Surgical History Is Significant For: Past Surgical History:  Procedure Laterality Date  . HERNIA REPAIR    . NOne    .  TONSILLECTOMY      His Family History Is Significant For: No family history on file.  His Social History Is Significant For: Social History   Social History  . Marital status: Legally Separated    Spouse name: N/A  . Number of children: 2  . Years of education: BA   Social History Main Topics  . Smoking status: Never Smoker  . Smokeless tobacco: Never Used  . Alcohol use 4.2 oz/week    7 Glasses of wine per week     Comment: 1 glass of red wine with dinner  . Drug use: No  . Sexual activity: No   Other Topics Concern  . None   Social History Narrative   Drinks 2 cups of coffee a day     His Allergies  Are:  No Known Allergies:   His Current Medications Are:  Outpatient Encounter Prescriptions as of 07/24/2016  Medication Sig  . carbidopa-levodopa (SINEMET IR) 25-100 MG tablet Take 1/2 pill twice daily x 1 week, then 1/2 pill 3 times a day x 1 week, then 1 pill 3 times a day thereafter.  . Multiple Vitamin (MULTIVITAMIN) tablet Take 1 tablet by mouth daily.  . propranolol ER (INDERAL LA) 60 MG 24 hr capsule TAKE 1 CAPSULE BY MOUTH DAILY.  . [DISCONTINUED] naproxen (NAPROSYN) 500 MG tablet TAKE 1 TABLET BY MOUTH 2 TIMES DAILY WITH A MEAL.   No facility-administered encounter medications on file as of 07/24/2016.   :  Review of Systems:  Out of a complete 14 point review of systems, all are reviewed and negative with the exception of these symptoms as listed below: Review of Systems  Neurological:       Patient is taking Sinemet 3 times a day. He feels that it does help him some.  He is traveling out of the country at the end of the year.     Objective:  Neurologic Exam  Physical Exam Physical Examination:   Vitals:   07/24/16 1301  BP: 124/68  Pulse: 78  Resp: 20   General Examination: The patient is a very pleasant 75 y.o. male in no acute distress.  HEENT: Normocephalic, atraumatic, pupils are equal, round and reactive to light and accommodation. Funduscopic exam is normal with sharp disc margins noted. Extraocular tracking shows mild saccadic breakdown without nystagmus noted. There is limitation to upper gaze. There is mild decrease in eye blink rate. He has a droopy left eyelid, not new per patient. Hearing is grossly intact. Face is symmetric with mild facial masking and normal facial sensation. There is no lip, neck or jaw tremor. Neck is moderately rigid with mild decrease in passive range of motion. He has an intermittent lower jaw and lip tremor. Oropharynx exam reveals mild mouth dryness. No overt sialorrhea is noted.   Chest: is clear to auscultation without wheezing,  rhonchi or crackles noted.  Heart: sounds are regular and normal without murmurs, rubs or gallops noted.   Abdomen: is soft, non-tender and non-distended with normal bowel sounds appreciated on auscultation.  Extremities: There is trace to 1+ pitting edema in the distal lower extremities bilaterally, left a little bit worse than right with chronic stasis-like changes seen, he has no significant varicose veins.   Skin: is warm and dry with no trophic changes noted. Age-related changes are noted on the skin.   Musculoskeletal: exam reveals no obvious joint deformities, tenderness, joint swelling or erythema.  Neurologically:  Mental status: The patient is awake and alert,  paying good attention. He is able to completely provide the history.however, he is not very detailed with respect to timeline. He is fully oriented. He has no aphasia, agnosia, apraxia or anomia, no significant bradyphrenia. Speech is mildly hypophonic with no dysarthria noted. Mood is congruent and affect is normal.   Cranial nerves are as described above under HEENT exam. In addition, shoulder shrug is normal with unequal shoulder height noted, left side higher than right.  Motor exam: Normal bulk, and strength for age is noted. There are no dyskinesias noted.  Tone is mildly rigid with presence of cogwheeling in the left upper extremity. There is overall moderate bradykinesia. There is no drift or rebound.  There is mild to moderate left-sided resting tremor in the upper extremity and lower extremity, a mild resting tremor in the right upper extremity and minimal in the right lower extremity and LLE. Fine motor skills are moderately impaired on the left with respect to finger taps, hand movements, rapid alternating patting, foot taps and foot agility, mild to moderate impairment noted on the right.  (On 06/06/16: Archimedes spiral drawing shows mild degree of tremulousness on the right and worse on the left. Handwriting is  tremulous, micrographic and still legible.)  Romberg is negative.  Reflexes are 1+ in the upper extremities and 1+ in the lower extremities. Toes are downgoing bilaterally.  Cerebellar testing shows no dysmetria or intention tremor on finger to nose testing. Heel to shin is unremarkable bilaterally. There is no truncal or gait ataxia.   Sensory exam is intact to light touch, pinprick, vibration, temperature sense and proprioception in the upper and lower extremities.   Gait, station and balance: he sits in the chair, leaning slightly to the right, when standing he has to push himself up and stands up slowly, slightly into the right noted, left shoulder higher than right, he walks with decreased stride length and decrease pace, decreased arm swing bilaterally, overall, walking speed a little better, turns a little better today and balance is very mildly impaired. Tandem walk is not possible for him.   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 He has bilateral findings, lateralization to the left, tremor predominance. He has fairly classic findings. He has been tolerating the C/L tid, takesThe medication around 9 or 9:30 AM, 2 or 3 PM, and 8 PM. He has a fairly stable exam, perhaps a little improvement in his tremor and walking speed is also little better.  I suggested we increase his Sinemet to 1-1/2 pills 3 times a day and keep him on a schedule of 9 AM, 2 PM and 7 PM. I adjusted his prescription in that regard. He is reminded to eat healthy, 3 meals per day with snacks in between and exercise on a regular basis. I think he can do better with his exercise regimen. He is advised regarding his recent brain MRI results. I suggested a  four-month follow-up Marcello Moores sooner as needed. I answered all his questions today and he was in agreement.  I spent 25 minutes in total face-to-face time with the patient, more than 50% of which was spent in counseling and coordination of care, reviewing test results, reviewing medication and discussing or reviewing the diagnosis of PD, its prognosis and treatment options.

## 2016-07-25 ENCOUNTER — Ambulatory Visit: Payer: Medicare Other | Admitting: Neurology

## 2016-07-26 ENCOUNTER — Other Ambulatory Visit: Payer: Self-pay | Admitting: Internal Medicine

## 2016-07-26 DIAGNOSIS — R251 Tremor, unspecified: Secondary | ICD-10-CM

## 2016-07-26 MED FILL — PROPRANOLOL ER 60 MG CAP: 60 | 30 days supply | Qty: 30 | Fill #0

## 2016-07-31 ENCOUNTER — Other Ambulatory Visit: Payer: Self-pay | Admitting: Internal Medicine

## 2016-07-31 DIAGNOSIS — M1711 Unilateral primary osteoarthritis, right knee: Secondary | ICD-10-CM

## 2016-07-31 MED FILL — NAPROXEN 500 MG TABLET: 500 | 30 days supply | Qty: 60 | Fill #0

## 2016-08-16 ENCOUNTER — Encounter: Payer: Self-pay | Admitting: Gastroenterology

## 2016-08-16 ENCOUNTER — Ambulatory Visit: Payer: Medicare Other | Attending: Internal Medicine | Admitting: Internal Medicine

## 2016-08-16 ENCOUNTER — Encounter: Payer: Self-pay | Admitting: Internal Medicine

## 2016-08-16 VITALS — BP 126/72 | HR 57 | Temp 98.2°F | Resp 18 | Ht 69.0 in | Wt 181.2 lb

## 2016-08-16 DIAGNOSIS — I480 Paroxysmal atrial fibrillation: Secondary | ICD-10-CM | POA: Insufficient documentation

## 2016-08-16 DIAGNOSIS — M1711 Unilateral primary osteoarthritis, right knee: Secondary | ICD-10-CM | POA: Diagnosis not present

## 2016-08-16 DIAGNOSIS — K4020 Bilateral inguinal hernia, without obstruction or gangrene, not specified as recurrent: Secondary | ICD-10-CM | POA: Insufficient documentation

## 2016-08-16 DIAGNOSIS — E785 Hyperlipidemia, unspecified: Secondary | ICD-10-CM | POA: Diagnosis not present

## 2016-08-16 DIAGNOSIS — Z1211 Encounter for screening for malignant neoplasm of colon: Secondary | ICD-10-CM | POA: Diagnosis not present

## 2016-08-16 DIAGNOSIS — Z23 Encounter for immunization: Secondary | ICD-10-CM | POA: Diagnosis not present

## 2016-08-16 DIAGNOSIS — Z7982 Long term (current) use of aspirin: Secondary | ICD-10-CM | POA: Insufficient documentation

## 2016-08-16 DIAGNOSIS — R600 Localized edema: Secondary | ICD-10-CM | POA: Insufficient documentation

## 2016-08-16 DIAGNOSIS — G20 Parkinson's disease: Secondary | ICD-10-CM | POA: Diagnosis not present

## 2016-08-16 DIAGNOSIS — I481 Persistent atrial fibrillation: Secondary | ICD-10-CM

## 2016-08-16 DIAGNOSIS — I4819 Other persistent atrial fibrillation: Secondary | ICD-10-CM

## 2016-08-16 LAB — LIPID PANEL
CHOLESTEROL: 184 mg/dL (ref 125–200)
HDL: 52 mg/dL (ref >=40)
LDL Cholesterol: 108 mg/dL (ref <130)
Total CHOL/HDL Ratio: 3.5 Ratio (ref <=5.0)
Triglycerides: 122 mg/dL (ref <150)
VLDL: 24 mg/dL (ref <30)

## 2016-08-16 MED ORDER — FUROSEMIDE 20 MG PO TABS: 20.0000 mg | ORAL_TABLET | Freq: Every day | ORAL | 3 refills | Status: DC

## 2016-08-16 NOTE — Progress Notes (Signed)
Patient is here for FU  Patient denies pain at this time Patient complains of intermittent pain being present a few days ago located on the right lower back side. Patient states ibuprofen provides him with relief.  Patient would like a colonoscopy referral.  Patient has taken medication today and patient has eaten today.  Patient would like tdap vaccine today. Patient tolerated tdap vaccine well today.

## 2016-08-16 NOTE — Progress Notes (Addendum)
Sean Ward, is a 75 y.o. male  [id number]  [id number]  DOB - 03-15-1941  Chief Complaint  Patient presents with  . Follow-up      Subjective:   Sean Ward is a 75 y.o. male with history of dependent bilateral lower limb edema, paroxysmal atrial fibrillation on aspirin only, osteoarthritis and bilateral inguinal hernia (patient has not yet have surgery) recently seen here for progressively worsening tremors x over 6 months, here today for a follow up visit. He was seen by Neurologist and has started treatment for Parkinson's Disease with Sinemet, patient claimed medication is helping and tremor is improving, he tolerates medication well. He denies any memory loss. He wants to start going to the Gym for regular physical exercise, the [employer] request a letter from PCP saying patient is fit to participate in regular exercise. No history of recent fall. He will be travelling to [place], [place] soon to visit family. He denies being depressed, denies any suicidal ideation or thoughts. Patient has No headache, No chest pain, No abdominal pain - No Nausea, No new weakness tingling or numbness, No Cough - SOB. Patient has not seen Cardiologist for a long time.  Problem  Primary Osteoarthritis of Right Knee  Colon Cancer Screening  Dyslipidemia   ALLERGIES: No Known Allergies PAST MEDICAL HISTORY: Past Medical History:  Diagnosis Date  . History of bronchitis    MEDICATIONS AT HOME: Prior to Admission medications   Medication Sig Start Date End Date Taking? Authorizing Provider  carbidopa-levodopa (SINEMET IR) 25-100 MG tablet Take 1.5 tablets by mouth 3 (three) times daily. Take 1 1/2 pills 3 times a day, at 9 am, 2 pm and 7 pm. 07/24/16  Yes Huston Foley, MD  Multiple Vitamin (MULTIVITAMIN) tablet Take 1 tablet by mouth daily.   Yes Historical Provider, MD  naproxen (NAPROSYN) 500 MG tablet Take 500 mg by mouth 2 (two) times daily with a meal.   Yes Historical Provider, MD    propranolol ER (INDERAL LA) 60 MG 24 hr capsule TAKE 1 CAPSULE BY MOUTH DAILY. 07/26/16  Yes Quentin Angst, MD  furosemide (LASIX) 20 MG tablet Take 1 tablet (20 mg total) by mouth daily. 08/16/16   Quentin Angst, MD    Objective:   Vitals:   08/16/16 0924  BP: 126/72  Pulse: (!) 57  Resp: 18  Temp: 98.2 F (36.8 C)  TempSrc: Oral  SpO2: 99%  Weight: 181 lb 3.2 oz (82.2 kg)  Height: 5\' 9"  (1.753 m)   Exam General appearance : Awake, alert, not in any distress. Speech Clear. Not toxic looking, tremor HEENT: Atraumatic and Normocephalic, pupils equally reactive to light and accomodation Neck: Supple, no JVD. No cervical lymphadenopathy.  Chest: Good air entry bilaterally, no added sounds  CVS: S1 S2 regular, no murmurs.  Abdomen: Bowel sounds present, Non tender and not distended with no gaurding, rigidity or rebound. Bilateral inguinal hernia Extremities: Mild pitting pedal edema bilaterally, both legs are warm to touch Neurology: Awake alert, and oriented X 3, CN II-XII intact, Non focal There is overall moderate bradykinesia. There is no drift There is mild to moderate left-sided resting tremor in the upper extremity and lower extremity, a mild resting tremor in the right upper extremity and minimal in the right lower extremity and LLE.  Data Review No results found for: HGBA1C  Assessment & Plan   1. 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HCC) - Continue Aspirin, continue propranolol, may change this to carvedilol in the near  future - Ambulatory referral to Cardiology - Urinalysis, Complete  2. Bilateral edema of lower extremity  Restart - furosemide (LASIX) 20 MG tablet; Take 1 tablet (20 mg total) by mouth daily.  Dispense: 30 tablet; Refill: 3  3. Colon cancer screening  - Ambulatory referral to Gastroenterology  4. Dyslipidemia  - Lipid panel  To address this please limit saturated fat to no more than 7% of your calories, limit cholesterol to 200  mg/day, increase fiber and exercise as tolerated. If needed we may add another cholesterol lowering medication to your regimen.   Patient have been counseled extensively about nutrition and exercise  Return in about 3 months (around 11/16/2016) for Follow up HTN, Follow up Pain and comorbidities.  The patient was given clear instructions to go to ER or return to medical center if symptoms don't improve, worsen or new problems develop. The patient verbalized understanding. The patient was told to call to get lab results if they haven't heard anything in the next week.   This note has been created with Education officer, environmentalDragon speech recognition software and smart phrase technology. Any transcriptional errors are unintentional.    Jeanann LewandowskyJEGEDE, Loni Delbridge, MD, MHA, Maxwell CaulFACP, FAAP, CPE [facility] [place], [place] [phone]   08/16/2016, 10:29 AM

## 2016-08-16 NOTE — Patient Instructions (Signed)
Parkinson Disease Parkinson disease is a disorder of the central nervous system, which includes the brain and spinal cord. A person with this disease slowly loses the ability to completely control body movements. Within the brain, there is a group of nerve cells (basal ganglia) that help control movement. The basal ganglia are damaged and do not work properly in a person with Parkinson disease. In addition, the basal ganglia produce and use a brain chemical called dopamine. The dopamine chemical sends messages to other parts of the body to control and coordinate body movements. Dopamine levels are low in a person with Parkinson disease. If the dopamine levels are low, then the body does not receive the correct messages it needs to move normally.  CAUSES  The exact reason why the basal ganglia get damaged is not known. Some medical researchers have thought that infection, genes, environment, and certain medicines may contribute to the cause.  SYMPTOMS   An early symptom of Parkinson disease is often an uncontrolled shaking (tremor) of the hands. The tremor will often disappear when the affected hand is consciously used.  As the disease progresses, walking, talking, getting out of a chair, and new movements become more difficult.  Muscles get stiff and movements become slower.  Balance and coordination become harder.  Depression, trouble swallowing, urinary problems, constipation, and sleep problems can occur.  Later in the disease, memory and thought processes may deteriorate. DIAGNOSIS  There are no specific tests to diagnose Parkinson disease. You may be referred to a neurologist for evaluation. Your caregiver will ask about your medical history, symptoms, and perform a physical exam. Blood tests and imaging tests of your brain may be performed to rule out other diseases. The imaging tests may include an MRI or a CT scan. TREATMENT  The goal of treatment is to relieve symptoms. Medicines may be  prescribed once the symptoms become troublesome. Medicine will not stop the progression of the disease, but medicine can make movement and balance better and help control tremors. Speech and occupational therapy may also be prescribed. Sometimes, surgical treatment of the brain can be done in young people. HOME CARE INSTRUCTIONS  Get regular exercise and rest periods during the day to help prevent exhaustion and depression.  If getting dressed becomes difficult, replace buttons and zippers with Velcro and elastic on your clothing.  Take all medicine as directed by your caregiver.  Install grab bars or railings in your home to prevent falls.  Go to speech or occupational therapy as directed.  Keep all follow-up visits as directed by your caregiver. SEEK MEDICAL CARE IF:  Your symptoms are not controlled with your medicine.  You fall.  You have trouble swallowing or choke on your food. MAKE SURE YOU:  Understand these instructions.  Will watch your condition.  Will get help right away if you are not doing well or get worse.   This information is not intended to replace advice given to you by your health care provider. Make sure you discuss any questions you have with your health care provider.   Document Released: 10/05/2000 Document Revised: 02/02/2013 Document Reviewed: 11/07/2011 Elsevier Interactive Patient Education 2016 Elsevier Inc[employer] Atrial Fibrillation Atrial fibrillation is a type of irregular or rapid heartbeat (arrhythmia). In atrial fibrillation, the heart quivers continuously in a chaotic pattern. This occurs when parts of the heart receive disorganized signals that make the heart unable to pump blood normally. This can increase the risk for stroke, heart failure, and other heart-related conditions. There  are different types of atrial fibrillation, including:  Paroxysmal atrial fibrillation. This type starts suddenly, and it usually stops on its own shortly after it  starts.  Persistent atrial fibrillation. This type often lasts longer than a week. It may stop on its own or with treatment.  Long-lasting persistent atrial fibrillation. This type lasts longer than 12 months.  Permanent atrial fibrillation. This type does not go away. Talk with your health care provider to learn about the type of atrial fibrillation that you have. CAUSES This condition is caused by some heart-related conditions or procedures, including:  A heart attack.  Coronary artery disease.  Heart failure.  Heart valve conditions.  High blood pressure.  Inflammation of the sac that surrounds the heart (pericarditis).  Heart surgery.  Certain heart rhythm disorders, such as Wolf-Parkinson-White syndrome. Other causes include:  Pneumonia.  Obstructive sleep apnea.  Blockage of an artery in the lungs (pulmonary embolism, or PE).  Lung cancer.  Chronic lung disease.  Thyroid problems, especially if the thyroid is overactive (hyperthyroidism).  Caffeine.  Excessive alcohol use or illegal drug use.  Use of some medicines, including certain decongestants and diet pills. Sometimes, the cause cannot be found. RISK FACTORS This condition is more likely to develop in:  People who are older in age.  People who smoke.  People who have diabetes mellitus.  People who are overweight (obese).  Athletes who exercise vigorously. SYMPTOMS Symptoms of this condition include:  A feeling that your heart is beating rapidly or irregularly.  A feeling of discomfort or pain in your chest.  Shortness of breath.  Sudden light-headedness or weakness.  Getting tired easily during exercise. In some cases, there are no symptoms. DIAGNOSIS Your health care provider may be able to detect atrial fibrillation when taking your pulse. If detected, this condition may be diagnosed with:  An electrocardiogram (ECG).  A Holter monitor test that records your heartbeat patterns  over a 24-hour period.  Transthoracic echocardiogram (TTE) to evaluate how blood flows through your heart.  Transesophageal echocardiogram (TEE) to view more detailed images of your heart.  A stress test.  Imaging tests, such as a CT scan or chest X-ray.  Blood tests. TREATMENT The main goals of treatment are to prevent blood clots from forming and to keep your heart beating at a normal rate and rhythm. The type of treatment that you receive depends on many factors, such as your underlying medical conditions and how you feel when you are experiencing atrial fibrillation. This condition may be treated with:  Medicine to slow down the heart rate, bring the heart's rhythm back to normal, or prevent clots from forming.  Electrical cardioversion. This is a procedure that resets your heart's rhythm by delivering a controlled, low-energy shock to the heart through your skin.  Different types of ablation, such as catheter ablation, catheter ablation with pacemaker, or surgical ablation. These procedures destroy the heart tissues that send abnormal signals. When the pacemaker is used, it is placed under your skin to help your heart beat in a regular rhythm. HOME CARE INSTRUCTIONS  Take over-the counter and prescription medicines only as told by your health care provider.  If your health care provider prescribed a blood-thinning medicine (anticoagulant), take it exactly as told. Taking too much blood-thinning medicine can cause bleeding. If you do not take enough blood-thinning medicine, you will not have the protection that you need against stroke and other problems.  Do not use tobacco products, including cigarettes, chewing tobacco, and  e-cigarettes. If you need help quitting, ask your health care provider.  If you have obstructive sleep apnea, manage your condition as told by your health care provider.  Do not drink alcohol.  Do not drink beverages that contain caffeine, such as coffee,  soda, and tea.  Maintain a healthy weight. Do not use diet pills unless your health care provider approves. Diet pills may make heart problems worse.  Follow diet instructions as told by your health care provider.  Exercise regularly as told by your health care provider.  Keep all follow-up visits as told by your health care provider. This is important. PREVENTION  Avoid drinking beverages that contain caffeine or alcohol.  Avoid certain medicines, especially medicines that are used for breathing problems.  Avoid certain herbs and herbal medicines, such as those that contain ephedra or ginseng.  Do not use illegal drugs, such as cocaine and amphetamines.  Do not smoke.  Manage your high blood pressure. SEEK MEDICAL CARE IF:  You notice a change in the rate, rhythm, or strength of your heartbeat.  You are taking an anticoagulant and you notice increased bruising.  You tire more easily when you exercise or exert yourself. SEEK IMMEDIATE MEDICAL CARE IF:  You have chest pain, abdominal pain, sweating, or weakness.  You feel nauseous.  You notice blood in your vomit, bowel movement, or urine.  You have shortness of breath.  You suddenly have swollen feet and ankles.  You feel dizzy.  You have sudden weakness or numbness of the face, arm, or leg, especially on one side of the body.  You have trouble speaking, trouble understanding, or both (aphasia).  Your face or your eyelid droops on one side. These symptoms may represent a serious problem that is an emergency. Do not wait to see if the symptoms will go away. Get medical help right away. Call your local emergency services (911 in the [place] Do not drive yourself to the hospital.   This information is not intended to replace advice given to you by your health care provider. Make sure you discuss any questions you have with your health care provider.   Document Released: 10/08/2005 Document Revised: 06/29/2015 Document  Reviewed: 02/02/2015 Elsevier Interactive Patient Education [employer]

## 2016-08-17 ENCOUNTER — Telehealth: Payer: Self-pay | Admitting: *Deleted

## 2016-08-17 LAB — URINALYSIS, COMPLETE
BACTERIA UA: NONE SEEN [HPF]
BILIRUBIN URINE: NEGATIVE
CRYSTALS: NONE SEEN [HPF]
Casts: NONE SEEN [LPF]
GLUCOSE, UA: NEGATIVE
HGB URINE DIPSTICK: NEGATIVE
KETONES UR: NEGATIVE
Leukocytes, UA: NEGATIVE
Nitrite: NEGATIVE
PROTEIN: NEGATIVE
SQUAMOUS EPITHELIAL / LPF: NONE SEEN [HPF] (ref <=5)
Specific Gravity, Urine: 1.016 (ref 1.001–1.035)
WBC UA: NONE SEEN WBC/HPF (ref <=5)
Yeast: NONE SEEN [HPF]
pH: 7.5 (ref 5.0–8.0)

## 2016-08-17 NOTE — Telephone Encounter (Signed)
-----   Message from Quentin Angstlugbemiga E Jegede, MD sent at 08/17/2016 10:53 AM EDT ----- Please inform patient of normal urine analysis and cholesterol.

## 2016-08-17 NOTE — Telephone Encounter (Signed)
MA informed patient of urinalysis and cholesterol both being normal. [facility].

## 2016-08-27 MED FILL — NAPROXEN 500 MG TABLET: 500 | 30 days supply | Qty: 60 | Fill #1

## 2016-08-27 MED FILL — PROPRANOLOL ER 60 MG CAP: 60 | 30 days supply | Qty: 30 | Fill #1

## 2016-08-28 DIAGNOSIS — H04123 Dry eye syndrome of bilateral lacrimal glands: Secondary | ICD-10-CM | POA: Diagnosis not present

## 2016-08-28 DIAGNOSIS — H25811 Combined forms of age-related cataract, right eye: Secondary | ICD-10-CM | POA: Diagnosis not present

## 2016-08-28 DIAGNOSIS — H40013 Open angle with borderline findings, low risk, bilateral: Secondary | ICD-10-CM | POA: Diagnosis not present

## 2016-08-28 DIAGNOSIS — H2512 Age-related nuclear cataract, left eye: Secondary | ICD-10-CM | POA: Diagnosis not present

## 2016-08-31 ENCOUNTER — Other Ambulatory Visit: Payer: Self-pay | Admitting: Internal Medicine

## 2016-09-07 MED FILL — CARBIDOPA-LEVODOPA 25-100 T: 25-100 | 33 days supply | Qty: 150 | Fill #1

## 2016-09-09 NOTE — Progress Notes (Signed)
Cardiology Office Note   Date:  09/10/2016   ID:  Sean Ward, DOB May 04, 1941, MRN [id number]  PCP:  Angelica Chessman, MD    Chief Complaint  Patient presents with  . New Patient (Initial Visit)   AFib  Wt Readings from Last 3 Encounters:  09/10/16 87 kg (191 lb 12.8 oz)  08/16/16 82.2 kg (181 lb 3.2 oz)  07/24/16 79.8 kg (176 lb)       History of Present Illness: Sean Ward is a 75 y.o. male  who was diagnosed with atrial fibrillation in January 2016. [facility]. He was asymptomatic when he initially was diagnosed with a heart rate of 115 220 bpm. His chads score was only 1 and therefore he was not started on anticoagulation. He was started on full dose aspirin. He was treated with Lasix for edema, but this did not seem to help the edema which was thought to be due to venous stasis and varicose veins.  He has some leg swelling.  Better when he walks.  Better with compression stockings.  He will start a stationary bike.    He has Parkinsons sx.  He is benefitting from the medication.    Had a fall.  No injury from this.      Past Medical History:  Diagnosis Date  . History of bronchitis     Past Surgical History:  Procedure Laterality Date  . HERNIA REPAIR    . NOne    . TONSILLECTOMY       Current Outpatient Prescriptions  Medication Sig Dispense Refill  . carbidopa-levodopa (SINEMET IR) 25-100 MG tablet Take 1.5 tablets by mouth 3 (three) times daily. Take 1 1/2 pills 3 times a day, at 9 am, 2 pm and 7 pm. 150 tablet 5  . latanoprost (XALATAN) 0.005 % ophthalmic solution   12  . Multiple Vitamin (MULTIVITAMIN) tablet Take 1 tablet by mouth daily.    . naproxen (NAPROSYN) 500 MG tablet Take 500 mg by mouth 2 (two) times daily with a meal.    . propranolol ER (INDERAL LA) 60 MG 24 hr capsule TAKE 1 CAPSULE BY MOUTH DAILY. 30 capsule 3  . furosemide (LASIX) 20 MG tablet Take 1 tablet (20 mg  total) by mouth daily. (Patient not taking: Reported on 09/10/2016) 30 tablet 3   No current facility-administered medications for this visit.     Allergies:   Patient has no known allergies.    Social History:  The patient  reports that he has never smoked. He has never used smokeless tobacco. He reports that he drinks about 4.2 oz of alcohol per week . He reports that he does not use drugs.   Family History:  The patient's family history is not on file.  No early CAD.    ROS:  Please see the history of present illness.   Otherwise, review of systems are positive for LE edema.   All other systems are reviewed and negative.    PHYSICAL EXAM: VS:  BP 116/72 (BP Location: Right Arm, Patient Position: Sitting, Cuff Size: Normal)   Pulse 71   Ht '5\' 9"'  (1.753 m)   Wt 87 kg (191 lb 12.8 oz)   BMI 28.32 kg/m  , BMI Body mass index is 28.32 kg/m. GEN: Well nourished, well developed, in no acute distress  HEENT: normal  Neck: no JVD, carotid bruits, or masses Cardiac: RRR; no murmurs, rubs, or  gallops, bilateral pitting edema  chronic- left > right Respiratory:  clear to auscultation bilaterally, normal work of breathing GI: soft, nontender, nondistended, + BS MS: no deformity or atrophy  Skin: warm and dry, no rash Neuro:  Strength and sensation are intact Psych: euthymic mood, full affect   EKG:   The ekg ordered today demonstrates NSR, no ST segment changes   Recent Labs: 05/10/2016: ALT 17; BUN 21; Creat 1.11; Hemoglobin 14.0; Platelets 246; Potassium 4.7; Sodium 140; TSH 2.36   Lipid Panel    Component Value Date/Time   CHOL 184 08/16/2016 1038   TRIG 122 08/16/2016 1038   HDL 52 08/16/2016 1038   CHOLHDL 3.5 08/16/2016 1038   VLDL 24 08/16/2016 1038   LDLCALC 108 08/16/2016 1038     Other studies Reviewed: Additional studies/ records that were reviewed today with results demonstrating: Normal echo in 2015.   ASSESSMENT AND PLAN:  1. PAF: In NSR.  Continue  propranolol and aspirin. We discussed that his chads score would increase next year when he turns 76. We would have to reconsider anticoagulation at that point. We'll have to assess the risks and benefits of anticoagulation given his Parkinson's and falls risk. At this point, will just continue aspirin. 2. Lower extremity edema: He is hesitant to take Lasix because he has to go to the bathroom a lot. He has not been taking it. Use compression stockings.  Check BMet in a week after starting low dose Lasix.  He is willing to try 20 mg daily.  Elevate legs.  He is traveling to [place]. We'll have to check his be met when he comes back. 3. Fall risk: He has Parkinson's.  He has had one fall in the past year.     Current medicines are reviewed at length with the patient today.  The patient concerns regarding his medicines were addressed.  The following changes have been made:  Restart Lasix.  Labs/ tests ordered today include:   Orders Placed This Encounter  Procedures  . Basic metabolic panel    Recommend 150 minutes/week of aerobic exercise Low fat, low carb, high fiber diet recommended  Disposition:   FU in 3 months   Signed, Larae Grooms, MD  09/10/2016 4:33 PM    [facility] [place], [place], [place]  [address] Phone: [phone]; Fax: [phone]

## 2016-09-10 ENCOUNTER — Other Ambulatory Visit: Payer: Self-pay

## 2016-09-10 ENCOUNTER — Encounter: Payer: Self-pay | Admitting: Interventional Cardiology

## 2016-09-10 ENCOUNTER — Encounter (INDEPENDENT_AMBULATORY_CARE_PROVIDER_SITE_OTHER): Payer: Self-pay

## 2016-09-10 ENCOUNTER — Ambulatory Visit (INDEPENDENT_AMBULATORY_CARE_PROVIDER_SITE_OTHER): Payer: Medicare Other | Admitting: Interventional Cardiology

## 2016-09-10 VITALS — BP 116/72 | HR 71 | Ht 69.0 in | Wt 191.8 lb

## 2016-09-10 DIAGNOSIS — I4891 Unspecified atrial fibrillation: Secondary | ICD-10-CM | POA: Diagnosis not present

## 2016-09-10 DIAGNOSIS — R600 Localized edema: Secondary | ICD-10-CM | POA: Diagnosis not present

## 2016-09-10 DIAGNOSIS — Z9181 History of falling: Secondary | ICD-10-CM | POA: Diagnosis not present

## 2016-09-10 MED ORDER — FUROSEMIDE 20 MG PO TABS: 20.0000 mg | ORAL_TABLET | Freq: Every day | ORAL | 3 refills | Status: DC

## 2016-09-10 MED FILL — FUROSEMIDE 20 MG TABLET: 20 | 30 days supply | Qty: 30 | Fill #0

## 2016-09-10 NOTE — Patient Instructions (Signed)
**Note De-Identified  Obfuscation** Medication Instructions:  Start taking Furosemide 20 mg in the mornings. All other medications remain the same.  Labwork: BMET on 10/01/16 anytime between 7:30 and 5:00.  Testing/Procedures: None  Follow-Up: In 3 months     If you need a refill on your cardiac medications before your next appointment, please call your pharmacy.

## 2016-09-19 ENCOUNTER — Ambulatory Visit: Payer: Medicare Other | Admitting: Neurology

## 2016-10-01 ENCOUNTER — Other Ambulatory Visit: Payer: Medicare Other

## 2016-10-02 ENCOUNTER — Other Ambulatory Visit: Payer: Self-pay | Admitting: Internal Medicine

## 2016-10-02 DIAGNOSIS — M1711 Unilateral primary osteoarthritis, right knee: Secondary | ICD-10-CM

## 2016-10-04 MED FILL — NAPROXEN 500 MG TABLET: 500 | 30 days supply | Qty: 60 | Fill #0

## 2016-10-08 ENCOUNTER — Other Ambulatory Visit: Payer: Medicare Other

## 2016-10-08 MED FILL — PROPRANOLOL ER 60 MG CAP: 60 | 30 days supply | Qty: 30 | Fill #2

## 2016-10-09 MED FILL — FUROSEMIDE 20 MG TABLET: 20 | 30 days supply | Qty: 30 | Fill #1

## 2016-10-17 MED FILL — CARBIDOPA-LEVODOPA 25-100 T: 25-100 | 33 days supply | Qty: 150 | Fill #2

## 2016-10-25 ENCOUNTER — Other Ambulatory Visit: Payer: Medicare Other

## 2016-10-29 ENCOUNTER — Encounter: Payer: Medicare Other | Admitting: Gastroenterology

## 2016-11-08 MED FILL — PROPRANOLOL ER 60 MG CAP: 60 | 30 days supply | Qty: 30 | Fill #3

## 2016-11-22 MED FILL — CARBIDOPA-LEVODOPA 25-100 T: 25-100 | 33 days supply | Qty: 150 | Fill #3

## 2016-12-05 MED FILL — PROPRANOLOL ER 60 MG CAP: 60 | 30 days supply | Qty: 30 | Fill #4

## 2016-12-09 NOTE — Progress Notes (Deleted)
Cardiology Office Note   Date:  12/09/2016   ID:  Sean Ward, DOB November 18, 1940, MRN [id number]  PCP:  Jeanann Lewandowsky, MD    No chief complaint on file.  AFib, needs BMet  Wt Readings from Last 3 Encounters:  09/10/16 191 lb 12.8 oz (87 kg)  08/16/16 181 lb 3.2 oz (82.2 kg)  07/24/16 176 lb (79.8 kg)       History of Present Illness: Sean Ward is a 76 y.o. male  who was diagnosed with atrial fibrillation in January 2016. He [facility]. He was asymptomatic when he initially was diagnosed with a heart rate of 115 220 bpm. His chads score was only 1 and therefore he was not started on anticoagulation. He was started on full dose aspirin. He was treated with Lasix for edema, but this did not seem to help the edema which was thought to be due to venous stasis and varicose veins.  He has some leg swelling.  Better when he walks.  Better with compression stockings.  He will start a stationary bike.    He has Parkinsons sx.  He is benefitting from the medication.    Had a fall.  No injury from this.      Past Medical History:  Diagnosis Date  . History of bronchitis     Past Surgical History:  Procedure Laterality Date  . HERNIA REPAIR    . NOne    . TONSILLECTOMY       Current Outpatient Prescriptions  Medication Sig Dispense Refill  . carbidopa-levodopa (SINEMET IR) 25-100 MG tablet Take 1.5 tablets by mouth 3 (three) times daily. Take 1 1/2 pills 3 times a day, at 9 am, 2 pm and 7 pm. 150 tablet 5  . furosemide (LASIX) 20 MG tablet Take 1 tablet (20 mg total) by mouth daily. 30 tablet 3  . latanoprost (XALATAN) 0.005 % ophthalmic solution   12  . Multiple Vitamin (MULTIVITAMIN) tablet Take 1 tablet by mouth daily.    . naproxen (NAPROSYN) 500 MG tablet Take 500 mg by mouth 2 (two) times daily with a meal.    . naproxen (NAPROSYN) 500 MG tablet TAKE 1 TABLET BY MOUTH 2 TIMES DAILY WITH A MEAL. 60  tablet 0  . propranolol ER (INDERAL LA) 60 MG 24 hr capsule TAKE 1 CAPSULE BY MOUTH DAILY. 30 capsule 3   No current facility-administered medications for this visit.     Allergies:   Patient has no known allergies.    Social History:  The patient  reports that he has never smoked. He has never used smokeless tobacco. He reports that he drinks about 4.2 oz of alcohol per week . He reports that he does not use drugs.   Family History:  The patient's family history is not on file.  No early CAD.    ROS:  Please see the history of present illness.   Otherwise, review of systems are positive for LE edema.   All other systems are reviewed and negative.    PHYSICAL EXAM: VS:  There were no vitals taken for this visit. , BMI There is no height or weight on file to calculate BMI. GEN: Well nourished, well developed, in no acute distress  HEENT: normal  Neck: no JVD, carotid bruits, or masses Cardiac: RRR; no murmurs, rubs, or gallops, bilateral pitting edema  chronic- left > right Respiratory:  clear to auscultation bilaterally, normal  work of breathing GI: soft, nontender, nondistended, + BS MS: no deformity or atrophy  Skin: warm and dry, no rash Neuro:  Strength and sensation are intact Psych: euthymic mood, full affect   EKG:   The ekg ordered today demonstrates NSR, no ST segment changes   Recent Labs: 05/10/2016: ALT 17; BUN 21; Creat 1.11; Hemoglobin 14.0; Platelets 246; Potassium 4.7; Sodium 140; TSH 2.36   Lipid Panel    Component Value Date/Time   CHOL 184 08/16/2016 1038   TRIG 122 08/16/2016 1038   HDL 52 08/16/2016 1038   CHOLHDL 3.5 08/16/2016 1038   VLDL 24 08/16/2016 1038   LDLCALC 108 08/16/2016 1038     Other studies Reviewed: Additional studies/ records that were reviewed today with results demonstrating: Normal echo in 2015.   ASSESSMENT AND PLAN:  1. PAF: In NSR.  Continue propranolol and aspirin. We discussed that his chads score would increase next  year when he turns 76. We would have to reconsider anticoagulation at that point. We'll have to assess the risks and benefits of anticoagulation given his Parkinson's and falls risk. At this point, will just continue aspirin. 2. Lower extremity edema: He is hesitant to take Lasix because he has to go to the bathroom a lot. He has not been taking it. Use compression stockings.  Check BMet in a week after starting low dose Lasix.  He is willing to try 20 mg daily.  Elevate legs.  [place] to [place] We'll have to check his bmet when he comes back. 3. Fall risk: He has Parkinson's.  He has had one fall in the past year.     Current medicines are reviewed at length with the patient today.  The patient concerns regarding his medicines were addressed.  The following changes have been made:  Restart Lasix.  Labs/ tests ordered today include:   No orders of the defined types were placed in this encounter.   Recommend 150 minutes/week of aerobic exercise Low fat, low carb, high fiber diet recommended  Disposition:   FU in 3 months   Signed, Lance MussJayadeep Halynn Reitano, MD  12/09/2016 3:42 PM    [facility] [address], [place], [place]  [address] Phone: [phone]; Fax: [phone]

## 2016-12-12 ENCOUNTER — Ambulatory Visit: Payer: Medicare Other | Attending: Internal Medicine | Admitting: Internal Medicine

## 2016-12-12 VITALS — BP 113/76 | HR 67 | Temp 97.9°F | Resp 18 | Ht 69.0 in | Wt 202.0 lb

## 2016-12-12 DIAGNOSIS — I4819 Other persistent atrial fibrillation: Secondary | ICD-10-CM

## 2016-12-12 DIAGNOSIS — Z7982 Long term (current) use of aspirin: Secondary | ICD-10-CM | POA: Insufficient documentation

## 2016-12-12 DIAGNOSIS — M1711 Unilateral primary osteoarthritis, right knee: Secondary | ICD-10-CM | POA: Diagnosis not present

## 2016-12-12 DIAGNOSIS — Z79899 Other long term (current) drug therapy: Secondary | ICD-10-CM | POA: Diagnosis not present

## 2016-12-12 DIAGNOSIS — I481 Persistent atrial fibrillation: Secondary | ICD-10-CM | POA: Diagnosis not present

## 2016-12-12 DIAGNOSIS — R600 Localized edema: Secondary | ICD-10-CM | POA: Diagnosis not present

## 2016-12-12 DIAGNOSIS — R251 Tremor, unspecified: Secondary | ICD-10-CM

## 2016-12-12 DIAGNOSIS — I10 Essential (primary) hypertension: Secondary | ICD-10-CM | POA: Insufficient documentation

## 2016-12-12 DIAGNOSIS — M545 Low back pain: Secondary | ICD-10-CM | POA: Diagnosis not present

## 2016-12-12 DIAGNOSIS — G20 Parkinson's disease: Secondary | ICD-10-CM | POA: Diagnosis not present

## 2016-12-12 DIAGNOSIS — K4020 Bilateral inguinal hernia, without obstruction or gangrene, not specified as recurrent: Secondary | ICD-10-CM | POA: Diagnosis not present

## 2016-12-12 DIAGNOSIS — G20C Parkinsonism, unspecified: Secondary | ICD-10-CM

## 2016-12-12 MED ORDER — NAPROXEN 500 MG PO TABS: 500.0000 mg | ORAL_TABLET | Freq: Two times a day (BID) | ORAL | 3 refills | Status: DC

## 2016-12-12 MED ORDER — CARBIDOPA-LEVODOPA 25-100 MG PO TABS: 1.5000 | ORAL_TABLET | Freq: Three times a day (TID) | ORAL | 5 refills | Status: DC

## 2016-12-12 MED ORDER — PROPRANOLOL HCL ER 60 MG PO CP24: 60.0000 mg | ORAL_CAPSULE | Freq: Every day | ORAL | 3 refills | Status: DC

## 2016-12-12 MED ORDER — FUROSEMIDE 20 MG PO TABS: 20.0000 mg | ORAL_TABLET | Freq: Every day | ORAL | 3 refills | Status: DC

## 2016-12-12 MED FILL — NAPROXEN 500 MG TABLET: 500 | 30 days supply | Qty: 60 | Fill #0

## 2016-12-12 MED FILL — ?FUROSEMIDE 20 MG TABLET: 20 | 30 days supply | Qty: 30 | Fill #0

## 2016-12-12 NOTE — Patient Instructions (Signed)
Atrial Fibrillation °Introduction °Atrial fibrillation is a type of heartbeat that is irregular or fast (rapid). If you have this condition, your heart keeps quivering in a weird (chaotic) way. This condition can make it so your heart cannot pump blood normally. Having this condition gives a person more risk for stroke, heart failure, and other heart problems. There are different types of atrial fibrillation. Talk with your doctor to learn about the type that you have. °Follow these instructions at home: °· Take over-the-counter and prescription medicines only as told by your doctor. °· If your doctor prescribed a blood-thinning medicine, take it exactly as told. Taking too much of it can cause bleeding. If you do not take enough of it, you will not have the protection that you need against stroke and other problems. °· Do not use any tobacco products. These include cigarettes, chewing tobacco, and e-cigarettes. If you need help quitting, ask your doctor. °· If you have apnea (obstructive sleep apnea), manage it as told by your doctor. °· Do not drink alcohol. °· Do not drink beverages that have caffeine. These include coffee, soda, and tea. °· Maintain a healthy weight. Do not use diet pills unless your doctor says they are safe for you. Diet pills may make heart problems worse. °· Follow diet instructions as told by your doctor. °· Exercise regularly as told by your doctor. °· Keep all follow-up visits as told by your doctor. This is important. °Contact a doctor if: °· You notice a change in the speed, rhythm, or strength of your heartbeat. °· You are taking a blood-thinning medicine and you notice more bruising. °· You get tired more easily when you move or exercise. °Get help right away if: °· You have pain in your chest or your belly (abdomen). °· You have sweating or weakness. °· You feel sick to your stomach (nauseous). °· You notice blood in your throw up (vomit), poop (stool), or pee (urine). °· You are  short of breath. °· You suddenly have swollen feet and ankles. °· You feel dizzy. °· Your suddenly get weak or numb in your face, arms, or legs, especially if it happens on one side of your body. °· You have trouble talking, trouble understanding, or both. °· Your face or your eyelid droops on one side. °These symptoms may be an emergency. Do not wait to see if the symptoms will go away. Get medical help right away. Call your local emergency services (911 in the [place] Do not drive yourself to the hospital.  °This information is not intended to replace advice given to you by your health care provider. Make sure you discuss any questions you have with your health care provider. °Document Released: 07/17/2008 Document Revised: 03/15/2016 Document Reviewed: 02/02/2015 °© 2017 Elsevier ° °

## 2016-12-12 NOTE — Progress Notes (Signed)
Subjective:  Patient ID: Sean Ward, male    DOB: [birth year]  Age: 76 y.o. MRN: [id number]  CC: Hypertension  HPI Javed Cotto presents for f/u for chronic medical conditions. He has a hx of dependent bilateral lower limb edema, a fib on aspirin, OA, bilateral inguinal hernia, parkinson's disease, hypertension. He was diagnosed w/ a fib in jan 2016 and has been asymptomatic. He has a [place] score of 1 and is on aspirin. He has persistent lower leg edema and was started on lasix but has not been taking it b/c he was concerned about frequent urination. He says the swelling improves when he walks or wears compression stockings but he has not done that lately. He has an increased weight gain over the past few months of nearly 20 lbs. He denies sob, cp, orthopnea and feels weight gain is associated to not exercising. Last echo was 04/07/2014 and showed a normal ef. He is followed by cardiology. He eats a vegetarian diet and is mindful of healthy eating choices. He is very concerned about this weight gain. He has parkinsons symptoms but feels his tremors are improved with medications. He did have a fall last year w/o injury and has noticed his balance is stable but poor. He ambulates w/o aids. He sees Dr. Frances Furbish for neurology. He previously was very active and is afraid of falling. He also complains of a pain in his left lower back at approximately his waist line. Had previously taken naproxen for this pain. He is going to have surgery on his hernias in the near future in [place].   Past Medical History:  Diagnosis Date  . History of bronchitis    Past Surgical History:  Procedure Laterality Date  . HERNIA REPAIR    . NOne    . TONSILLECTOMY      Family History  Problem Relation Age of Onset  . Heart disease Neg Hx     Social History  Substance Use Topics  . Smoking status: Never Smoker  . Smokeless tobacco: Never Used  . Alcohol use 4.2 oz/week    7 Glasses of wine per week     Comment:  1 glass of red wine with dinner    ROS Review of Systems  Constitutional: Positive for activity change (decreased) and unexpected weight change (weight gain). Negative for fatigue.  HENT: Negative.   Eyes: Negative.   Respiratory: Negative.   Cardiovascular: Negative.   Gastrointestinal: Negative.   Endocrine: Negative.   Genitourinary: Negative.   Musculoskeletal: Positive for back pain and gait problem.  Neurological: Positive for tremors. Negative for dizziness, syncope, light-headedness and headaches.  Psychiatric/Behavioral: Negative.    Objective:   Today's Vitals: BP 113/76 (BP Location: Right Arm, Patient Position: Sitting, Cuff Size: Normal)   Pulse 67   Temp 97.9 F (36.6 C) (Oral)   Resp 18   Ht 5\' 9"  (1.753 m)   Wt 202 lb (91.6 kg)   SpO2 100%   BMI 29.83 kg/m   Physical Exam  Constitutional: He is oriented to person, place, and time. He appears well-developed and well-nourished.  HENT:  Head: Normocephalic.  Eyes: Pupils are equal, round, and reactive to light.  Neck: Normal range of motion. Neck supple. No JVD present.  Cardiovascular: An irregularly irregular rhythm present.  Pulses:      Radial pulses are 2+ on the right side, and 2+ on the left side.  2+ pitting edema BLE  Pulmonary/Chest: Effort normal. No respiratory distress. He has no  wheezes.  Abdominal: Soft. Bowel sounds are normal. There is no tenderness.  Musculoskeletal: Normal range of motion.  Neurological: He is alert and oriented to person, place, and time. He displays tremor.  Skin: Skin is warm and dry.  Psychiatric: He has a normal mood and affect. His behavior is normal. Thought content normal.   CBC Latest Ref Rng & Units 05/10/2016 08/21/2012 08/20/2012  WBC 3.8 - 10.8 K/uL 5.3 7.2 6.0  Hemoglobin 13.2 - 17.1 g/dL 16.1 09.6 04.5  Hematocrit 38.5 - 50.0 % 41.0 40.6 40.6  Platelets 140 - 400 K/uL 246 220 226   CMP Latest Ref Rng & Units 05/10/2016 10/27/2014 07/05/2014  Glucose 65 -  99 mg/dL 409(W) 96 80  BUN 7 - 25 mg/dL 21 23 19   Creatinine 0.70 - 1.18 mg/dL 1.19 1.47 8.29  Sodium 135 - 146 mmol/L 140 139 140  Potassium 3.5 - 5.3 mmol/L 4.7 4.5 4.1  Chloride 98 - 110 mmol/L 107 104 105  CO2 20 - 31 mmol/L 24 25 29   Calcium 8.6 - 10.3 mg/dL 9.0 8.8 9.0  Total Protein 6.1 - 8.1 g/dL 6.0(L) 6.0 -  Total Bilirubin 0.2 - 1.2 mg/dL 5.6(O) 0.9 -  Alkaline Phos 40 - 115 U/L 75 101 -  AST 10 - 35 U/L 16 20 -  ALT 9 - 46 U/L 17 20 -   Assessment & Plan:   Edema - his last echo was in 2015 and showed a normal ef. He does not complain of chf type symptoms but has persistent edema in his lower extremities. I think he will benefit from taking the lasix regularly and we will monitor for other symptoms of chf. He would also benefit from a repeat echo to monitor cardiac function. Counseled extensively regarding diet and exercise.  - Echo - Lasix, take 40 mg qd for 5 days then take lasix 20mg  qd  Hypertension - controlled on current medications. Continue to monitor your bp at home with a goal of < 140/90. Counseled on lifestyle modifications including DASH diet and reducing sodium intake and saturated fats.   Afib - stable, he is in an irregular rhythm today. Followed by cardiology. He is taking aspirin and his chads2 score is a 1 so anticoagulation is not warranted at this time. With his comorbidity of parkinson's and recent fall I would also be concerned about bleeding risk.   OA - This is a chronic problem he has had and may be exacerbated by his recent sedentary nature. We will re-start the naproxen to see if this improves his symptoms. - naproxen   Parkinson's disease - followed by neurology. Currently controlled on current medications.   Hernia - he is planning to have a surgical repair of his bilateral inguinal hernias. I think this will help his pain and comfort level.   Outpatient Encounter Prescriptions as of 12/12/2016  Medication Sig  . carbidopa-levodopa (SINEMET  IR) 25-100 MG tablet Take 1.5 tablets by mouth 3 (three) times daily. Take 1 1/2 pills 3 times a day, at 9 am, 2 pm and 7 pm.  . furosemide (LASIX) 20 MG tablet Take 1 tablet (20 mg total) by mouth daily.  Marland Kitchen latanoprost (XALATAN) 0.005 % ophthalmic solution   . Multiple Vitamin (MULTIVITAMIN) tablet Take 1 tablet by mouth daily.  . naproxen (NAPROSYN) 500 MG tablet Take 500 mg by mouth 2 (two) times daily with a meal.  . propranolol ER (INDERAL LA) 60 MG 24 hr capsule TAKE 1 CAPSULE BY  MOUTH DAILY.  . [DISCONTINUED] naproxen (NAPROSYN) 500 MG tablet TAKE 1 TABLET BY MOUTH 2 TIMES DAILY WITH A MEAL.   No facility-administered encounter medications on file as of 12/12/2016.     Follow-up: No Follow-up on file.    Consuello MasseLauren Rylend Pietrzak, AGNP Student   Assessment & Plan   1. 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HCC)  - ECHOCARDIOGRAM COMPLETE; Future  2. Bilateral edema of lower extremity Re-start - furosemide (LASIX) 20 MG tablet; Take 1 tablet (20 mg total) by mouth daily.  Dispense: 30 tablet; Refill: 3  3. Primary osteoarthritis of right knee Prescribe - naproxen (NAPROSYN) 500 MG tablet; Take 1 tablet (500 mg total) by mouth 2 (two) times daily with a meal.  Dispense: 60 tablet; Refill: 3  4. Tremor Refill - propranolol ER (INDERAL LA) 60 MG 24 hr capsule; Take 1 capsule (60 mg total) by mouth daily.  Dispense: 60 capsule; Refill: 3  5. Primary Parkinsonism (HCC) Refill - carbidopa-levodopa (SINEMET IR) 25-100 MG tablet; Take 1.5 tablets by mouth 3 (three) times daily. Take 1 1/2 pills 3 times a day, at 9 am, 2 pm and 7 pm.  Dispense: 150 tablet; Refill: 5   Return in about 3 months (around 03/11/2017), or AFib, Edema.  The patient was given clear instructions to go to ER or return to medical center if symptoms don't improve, worsen or new problems develop. The patient verbalized understanding. The patient was told to call to get lab results if they haven't heard anything in the next week.    This note has been created with Education officer, environmentalDragon speech recognition software and smart phrase technology. Any transcriptional errors are unintentional.    Jeanann LewandowskyJEGEDE, OLUGBEMIGA, MD, MHA, FACP, FAAP, CPE [facility] [place], [place] [phone]   12/15/2016, 12:50 PM

## 2016-12-14 ENCOUNTER — Ambulatory Visit: Payer: Medicare Other | Admitting: Interventional Cardiology

## 2016-12-17 ENCOUNTER — Telehealth: Payer: Self-pay | Admitting: *Deleted

## 2016-12-17 NOTE — Telephone Encounter (Signed)
Patient is aware of ECHO being scheduled for March 2 at 3:00pm. Patient advised to arrive at 2:45 for check in. Medical Assistant left message on patient's home and cell voicemail. Voicemail states to give a call back to [place] [facility] at [phone].

## 2016-12-21 ENCOUNTER — Ambulatory Visit (HOSPITAL_COMMUNITY): Payer: Medicare Other

## 2016-12-26 MED FILL — CARBIDOPA-LEVODOPA 25-100 T: 25-100 | 33 days supply | Qty: 150 | Fill #4

## 2017-01-03 ENCOUNTER — Ambulatory Visit (HOSPITAL_COMMUNITY)
Admission: RE | Admit: 2017-01-03 | Discharge: 2017-01-03 | Disposition: A | Discharge status: Home / Self Care | Payer: Medicare Other | Source: Ambulatory Visit | Attending: Internal Medicine | Admitting: Internal Medicine

## 2017-01-03 DIAGNOSIS — I340 Nonrheumatic mitral (valve) insufficiency: Secondary | ICD-10-CM | POA: Diagnosis not present

## 2017-01-03 DIAGNOSIS — I361 Nonrheumatic tricuspid (valve) insufficiency: Secondary | ICD-10-CM | POA: Insufficient documentation

## 2017-01-03 DIAGNOSIS — I517 Cardiomegaly: Secondary | ICD-10-CM | POA: Insufficient documentation

## 2017-01-03 DIAGNOSIS — I481 Persistent atrial fibrillation: Secondary | ICD-10-CM | POA: Insufficient documentation

## 2017-01-03 DIAGNOSIS — I4819 Other persistent atrial fibrillation: Secondary | ICD-10-CM

## 2017-01-03 LAB — ECHOCARDIOGRAM COMPLETE
AVLVOTPG: 2 mmHg
Ao-asc: 31 cm
CHL CUP MV DEC (S): 190
CHL CUP RV SYS PRESS: 24 mmHg
CHL CUP TV REG PEAK VELOCITY: 229 cm/s
E decel time: 190 msec
FS: 31 % (ref 28–44)
IVS/LV PW RATIO, ED: 1.05
LA ID, A-P, ES: 44 mm
LA diam end sys: 44 mm
LA vol: 119 mL
LADIAMINDEX: 2.06 cm/m2
LAVOLA4C: 90.5 mL
LAVOLIN: 55.7 mL/m2
LDCA: 3.8 cm2
LV PW d: 11.7 mm — AB (ref 0.6–1.1)
LVOT SV: 56 mL
LVOT VTI: 14.8 cm
LVOT diameter: 22 mm
LVOT peak vel: 77.6 cm/s
Lateral S' vel: 12.3 cm/s
MVPG: 3 mmHg
MVPKEVEL: 83.9 m/s
RV TAPSE: 19.8 mm
TRMAXVEL: 229 cm/s

## 2017-01-03 NOTE — Progress Notes (Signed)
  Echocardiogram 2D Echocardiogram has been performed.  Janalyn HarderWest, Mackie Goon R 01/03/2017, 2:55 PM

## 2017-01-04 ENCOUNTER — Telehealth: Payer: Self-pay | Admitting: Internal Medicine

## 2017-01-04 MED FILL — PROPRANOLOL ER 60 MG CAP: 60 | 30 days supply | Qty: 30 | Fill #0

## 2017-01-04 NOTE — Telephone Encounter (Signed)
-----   Message from Quentin Angstlugbemiga E Jegede, MD sent at 01/04/2017  9:29 AM EDT ----- Heart ultrasound (ECHO) showed normal pumping activity

## 2017-01-04 NOTE — Telephone Encounter (Signed)
Patient came by the office to speak with nurse regarding a procedure (echo) that he had done on yesterday 3/15 and he wants to know the results of it as well as have PCP retrieve them. Please follow up.  Thank you.

## 2017-01-04 NOTE — Telephone Encounter (Signed)
Patient verified DOB Patient is aware of ECHO showing normal heart pumping.  Patient expressed his understanding and had no further questions.

## 2017-01-16 ENCOUNTER — Encounter: Payer: Self-pay | Admitting: Neurology

## 2017-01-21 MED FILL — NAPROXEN 500 MG TABLET: 500 | 30 days supply | Qty: 60 | Fill #1

## 2017-01-22 MED FILL — CARBIDOPA-LEVODOPA 25-100 T: 25-100 | 33 days supply | Qty: 150 | Fill #5

## 2017-01-22 NOTE — Progress Notes (Deleted)
Cardiology Office Note   Date:  01/22/2017   ID:  Sean Ward, DOB December 13, 1940, MRN [id number]  PCP:  Angelica Chessman, MD    No chief complaint on file.  AFib  Wt Readings from Last 3 Encounters:  12/12/16 202 lb (91.6 kg)  09/10/16 191 lb 12.8 oz (87 kg)  08/16/16 181 lb 3.2 oz (82.2 kg)       History of Present Illness: Sean Ward is a 76 y.o. male  who was diagnosed with atrial fibrillation in January 2016. He [facility]. He was asymptomatic when he initially was diagnosed with a heart rate of 115 220 bpm. His chads score was only 1 and therefore he was not started on anticoagulation. He was started on full dose aspirin. He was treated with Lasix for edema, but this did not seem to help the edema which was thought to be due to venous stasis and varicose veins.  He has some leg swelling.  Better when he walks.  Better with compression stockings.  He will start a stationary bike.    He has Parkinsons sx.  He is benefitting from the medication.         Past Medical History:  Diagnosis Date  . History of bronchitis     Past Surgical History:  Procedure Laterality Date  . HERNIA REPAIR    . NOne    . TONSILLECTOMY       Current Outpatient Prescriptions  Medication Sig Dispense Refill  . carbidopa-levodopa (SINEMET IR) 25-100 MG tablet Take 1.5 tablets by mouth 3 (three) times daily. Take 1 1/2 pills 3 times a day, at 9 am, 2 pm and 7 pm. 150 tablet 5  . furosemide (LASIX) 20 MG tablet Take 1 tablet (20 mg total) by mouth daily. 30 tablet 3  . latanoprost (XALATAN) 0.005 % ophthalmic solution   12  . Multiple Vitamin (MULTIVITAMIN) tablet Take 1 tablet by mouth daily.    . naproxen (NAPROSYN) 500 MG tablet Take 1 tablet (500 mg total) by mouth 2 (two) times daily with a meal. 60 tablet 3  . propranolol ER (INDERAL LA) 60 MG 24 hr capsule Take 1 capsule (60 mg total) by mouth daily. 60 capsule 3    No current facility-administered medications for this visit.     Allergies:   Patient has no known allergies.    Social History:  The patient  reports that he has never smoked. He has never used smokeless tobacco. He reports that he drinks about 4.2 oz of alcohol per week . He reports that he does not use drugs.   Family History:  The patient's family history is not on file.  No early CAD.    ROS:  Please see the history of present illness.   Otherwise, review of systems are positive for LE edema.   All other systems are reviewed and negative.    PHYSICAL EXAM: VS:  There were no vitals taken for this visit. , BMI There is no height or weight on file to calculate BMI. GEN: Well nourished, well developed, in no acute distress  HEENT: normal  Neck: no JVD, carotid bruits, or masses Cardiac: RRR; no murmurs, rubs, or gallops, bilateral pitting edema  chronic- left > right Respiratory:  clear to auscultation bilaterally, normal work of breathing GI: soft, nontender, nondistended, + BS MS: no deformity or atrophy  Skin: warm and dry, no rash Neuro:  Strength  and sensation are intact Psych: euthymic mood, full affect   EKG:   The ekg ordered today demonstrates NSR, no ST segment changes   Recent Labs: 05/10/2016: ALT 17; BUN 21; Creat 1.11; Hemoglobin 14.0; Platelets 246; Potassium 4.7; Sodium 140; TSH 2.36   Lipid Panel    Component Value Date/Time   CHOL 184 08/16/2016 1038   TRIG 122 08/16/2016 1038   HDL 52 08/16/2016 1038   CHOLHDL 3.5 08/16/2016 1038   VLDL 24 08/16/2016 1038   LDLCALC 108 08/16/2016 1038     Other studies Reviewed: Additional studies/ records that were reviewed today with results demonstrating: Normal echo in 2015.   ASSESSMENT AND PLAN:  1. PAF: In NSR.  Continue propranolol and aspirin. We discussed that his chads score would increase next year when he turns 76. We would have to reconsider anticoagulation at that point. We'll have to assess  the risks and benefits of anticoagulation given his Parkinson's and falls risk. At this point, will just continue aspirin. 2. Lower extremity edema: He is hesitant to take Lasix because he has to go to the bathroom a lot. He has not been taking it. Use compression stockings.  Check BMet in a week after starting low dose Lasix.  He is willing to try 20 mg daily.  Elevate legs.  He is traveling to [place]. We'll have to check his be met when he comes back. 3. Fall risk: He has Parkinson's.  He has had one fall in the past year.     Current medicines are reviewed at length with the patient today.  The patient concerns regarding his medicines were addressed.  The following changes have been made:  Restart Lasix.  Labs/ tests ordered today include:   No orders of the defined types were placed in this encounter.   Recommend 150 minutes/week of aerobic exercise Low fat, low carb, high fiber diet recommended  Disposition:   FU in 3 months   Signed, Larae Grooms, MD  01/22/2017 8:09 PM    [facility] [address], [place], [place]  [address] Phone: [phone]; Fax: [phone]

## 2017-01-23 ENCOUNTER — Ambulatory Visit: Payer: Medicare Other | Admitting: Interventional Cardiology

## 2017-01-24 ENCOUNTER — Ambulatory Visit: Payer: Medicare Other | Admitting: Neurology

## 2017-01-25 ENCOUNTER — Encounter: Payer: Self-pay | Admitting: Interventional Cardiology

## 2017-01-25 MED FILL — PROPRANOLOL HCL ER 60 MG CP: 60 | 30 days supply | Qty: 30 | Fill #1

## 2017-02-12 ENCOUNTER — Encounter: Payer: Self-pay | Admitting: Neurology

## 2017-02-12 ENCOUNTER — Ambulatory Visit (INDEPENDENT_AMBULATORY_CARE_PROVIDER_SITE_OTHER): Payer: Medicare Other | Admitting: Neurology

## 2017-02-12 DIAGNOSIS — G20 Parkinson's disease: Secondary | ICD-10-CM | POA: Diagnosis not present

## 2017-02-12 MED ORDER — CARBIDOPA-LEVODOPA 25-100 MG PO TABS: 1.5000 | ORAL_TABLET | Freq: Three times a day (TID) | ORAL | 3 refills | Status: DC

## 2017-02-12 NOTE — Patient Instructions (Addendum)
We will keep your Sinemet the same.   Monitor your right leg weakness and talk to your primary care physician about seeing a spine specialist.   You could see a chiropractor.

## 2017-02-12 NOTE — Progress Notes (Signed)
Subjective:    Patient ID: Salar Molden is a 76 y.o. male.  HPI     Interim history:   Mr. Maffett is a 76 year old right-handed gentleman with an underlying medical history of bronchitis, PAF (for which he saw Dr. 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 The patient is unaccompanied today. I last saw him on 07/24/2016, at which time he reported tolerating the Sinemet and he felt that the tremor was less. He planned a trip to [place] for 6 days. He had no recent falls. He was not exercising on a regular basis. He reported a stable mood and memory. We talked about healthy lifestyle in the importance of exercise. I suggested he increase his Sinemet to 1-1/2 pills 3 times a day.  Today, 02/12/2017: He reports doing okay, had a recent second trip to [place], feels jet lag. Tries to ride his bike. Some R leg weakness with climbing stairs. 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 Had seen chiropractor about 3 y ago and also remotely in the past. Take 3 mg of melatonin prn for jet lag. Tries to hydrate well. Feels that the Sinemet has been working. No side effects, has increase it to 1-1/2 pills 3 times a day.  The patient's allergies, current medications, family history, past medical history, past social history, past surgical history and problem list were reviewed and updated as appropriate.   Previously (copied from previous notes for reference):   I first met him on 06/06/2016 at the request of his primary care physician, at which time he reported an upper extremity tremor on the left frontal past 6-12 months and more recently in the right upper extremity. On examination, he had signs and keeping with parkinsonism, with left-sided predominance. I suggested we start him on Sinemet 25-100 milligrams strength half a pill twice a day with gradual titration to 1 pill 3 times a day. I ordered a brain MRI wo contrast. He had  this on 06/18/16: IMPRESSION:  This MRI of the brain without contrast shows the following: 1.    Scattered T2/FLAIR hyperintense foci in the subcortical, deep and periventricular white matter in a pattern most consistent with mild chronic microvascular ischemic change.   None of the foci appears to be acute. 2.    There are no acute findings.   We called him with the results.    06/06/2016: He has had an upper extremity tremor for the past 6-12 months, unclear exactly how long and tremor started on the L, now also on the R side about a month ago. Leg tremors started about 2-3 months ago. 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 I reviewed your office note from 05/24/2016. You discontinued metoprolol and started him on propranolol long-acting 60 mg once daily. He is physically active, but not so much lately. He plays tennis and rides a bike, but not able to lately.  He was a Human resources officer, still does some contract work.  He drinks wine with dinner, 1-2 glasses. Tremor is less post alcohol. He drinks coffee about 3 cups per day, otherwise water, about 8 glasses.  He has swelling of both LEs, for the past 6+ months, was on Lasix, but no longer. He has been on a baby ASA.  He lives alone in an apartment and has to navigate the one flight of stairs. [place] once about 3 months ago, in his apartment onto his L side,  no head injury, no LOC, sleeps more during the day in the afternoon, which helps. He is separated (going through divorce), has one daughter and one son, both live in [place], [place].  He is a non-smoker, no FHx of tremors, or PD. He feels like he has a tendency to fall backwards. He has only fallen once in the past year, onto his left side, thankfully no injury as mentioned. Memory is fairly good, he denies any mood related issues, he does worry about his tremor. He is planning an overseas trip by the end of September.    His Past Medical History Is Significant For: Past Medical History:   Diagnosis Date  . History of bronchitis     His Past Surgical History Is Significant For: Past Surgical History:  Procedure Laterality Date  . HERNIA REPAIR    . NOne    . TONSILLECTOMY      His Family History Is Significant For: Family History  Problem Relation Age of Onset  . Heart disease Neg Hx     His Social History Is Significant For: Social History   Social History  . Marital status: Legally Separated    Spouse name: N/A  . Number of children: 2  . Years of education: BA   Social History Main Topics  . Smoking status: Never Smoker  . Smokeless tobacco: Never Used  . Alcohol use 4.2 oz/week    7 Glasses of wine per week     Comment: 1 glass of red wine with dinner  . Drug use: No  . Sexual activity: No   Other Topics Concern  . None   Social History Narrative   Drinks 2 cups of coffee a day     His Allergies Are:  No Known Allergies:   His Current Medications Are:  Outpatient Encounter Prescriptions as of 02/12/2017  Medication Sig  . carbidopa-levodopa (SINEMET IR) 25-100 MG tablet Take 1.5 tablets by mouth 3 (three) times daily. Take 1 1/2 pills 3 times a day, at 9 am, 2 pm and 7 pm.  . furosemide (LASIX) 20 MG tablet Take 1 tablet (20 mg total) by mouth daily.  Marland Kitchen latanoprost (XALATAN) 0.005 % ophthalmic solution   . Multiple Vitamin (MULTIVITAMIN) tablet Take 1 tablet by mouth daily.  . naproxen (NAPROSYN) 500 MG tablet Take 1 tablet (500 mg total) by mouth 2 (two) times daily with a meal.  . propranolol ER (INDERAL LA) 60 MG 24 hr capsule Take 1 capsule (60 mg total) by mouth daily.   No facility-administered encounter medications on file as of 02/12/2017.   :  Review of Systems:  Out of a complete 14 point review of systems, all are reviewed and negative with the exception of these symptoms as listed below:  Review of Systems  Neurological:       Pt presents today to follow up on his PD. Pt says that he notices weakness when climbing stairs.  He also has some tremors in his right hand when climbing stairs.    Objective:  Neurologic Exam  Physical Exam Physical Examination:   Vitals:   02/12/17 1044  BP: 100/68  Pulse: 60   General Examination: The patient is a very pleasant 76 y.o. male in no acute distress. He appears well-developed and well-nourished and well groomed.   HEENT: Normocephalic, atraumatic, pupils are equal, round and reactive to light and accommodation. Extraocular tracking Shows mild saccadic breakdown. He has mild facial masking, very slight left ptosis. Neck rigidity  is about the same, overall mild to moderate. He has an intermittent lower lip and jaw tremor. He has no drooling. Hearing is grossly intact.   Chest: Clear to auscultation without wheezing, rhonchi or crackles noted.  Heart: S1+S2+0, regular and normal without murmurs, rubs or gallops noted.   Abdomen: Soft, non-tender and non-distended with normal bowel sounds appreciated on auscultation.  Extremities: There is 1+ to 2+ edema in the left lower extremity, slight edema in the right lower extremity. Slightly worse overall from last time.  Skin: Warm and dry without trophic changes noted.  Musculoskeletal: exam reveals no obvious joint deformities, tenderness or joint swelling or erythema.   Neurologically:  Mental status: The patient is awake, alert and oriented in all 4 spheres. His immediate and remote memory, attention, language skills and fund of knowledge are appropriate. There is no evidence of aphasia, agnosia, apraxia or anomia. Speech is very mildly hypophonic, no dysarthria. Thought process is linear. Mood is normal and affect is normal.  Cranial nerves II - XII are as described above under HEENT exam.  Motor exam: Normal bulk,  and strength for age, tone is mildly increased, left more than right. He has mild resting tremor on the left upper extremity and an intermittent slight resting tremor in the right upper extremity, mild  intermittent resting tremor in the left lower extremity. Romberg is negative. Reflexes are about 1+ throughout. Fine motor skills are mild to moderately impaired on the left and mildly so on the right. He stands with mild difficulty, posture is mildly stooped, he does have some evidence of scoliosis, right shoulder is lower than left. He walks with decreased stride length and pace, decreased arm swing bilaterally, left more than right. Tandem walk is not safe to test. Balance is overall minimally impaired.   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 He does have bilateral findings, overall fairly stable findings from last time, we increased his Sinemet from one pill 3 times a day to 1-1/2 pills 3 times a day. He has had some intermittent issues with right leg feeling weaker when he climbs stairs. He has a history of low back pain and had seen a chiropractor in the past. 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 He had an x-ray in April 2015. He is encouraged to talk to his primary care physician about seeing a spine specialist were seen a chiropractor again. I suggested he continue with the Sinemet at the current dose, I renewed his prescription for 3 months at a time. He is advised to stay active physically and mentally, he still works as a Geophysicist/field seismologist and travels quite a bit as well. He is also advised to stay well-hydrated. He had a brain MRI without contrast on 06/18/16 which showed chronic white matter changes, no acute changes, no significant atrophy for age. I suggested a 6 month follow-up, sooner as needed. I answered all his questions  today and he was in agreement.  I spent 25 minutes in total face-to-face time with the patient, more than 50% of which was spent in counseling and coordination of care, reviewing test results, reviewing medication and discussing or reviewing the  diagnosis of PD, its prognosis and treatment options. Pertinent laboratory and imaging test results that were available during this visit with the patient were reviewed by me and considered in my medical decision making (see chart for details).

## 2017-02-20 ENCOUNTER — Ambulatory Visit: Payer: Medicare Other | Admitting: Internal Medicine

## 2017-03-05 MED FILL — CARBIDOPA-LEVODOPA 25-100 T: 25-100 | 30 days supply | Qty: 135 | Fill #0

## 2017-03-05 MED FILL — PROPRANOLOL HCL ER 60 MG CP: 60 | 30 days supply | Qty: 30 | Fill #2

## 2017-03-13 ENCOUNTER — Ambulatory Visit: Payer: Medicare Other | Admitting: Internal Medicine

## 2017-03-20 MED FILL — NAPROXEN 500 MG TABLET: 500 | 30 days supply | Qty: 60 | Fill #2

## 2017-04-05 MED FILL — PROPRANOLOL HCL ER 60 MG CP: 60 | 30 days supply | Qty: 30 | Fill #3

## 2017-04-05 MED FILL — CARBIDOPA-LEVODOPA 25-100 T: 25-100 | 30 days supply | Qty: 135 | Fill #1

## 2017-05-07 MED FILL — CARBIDOPA-LEVODOPA 25-100 T: 25-100 | 30 days supply | Qty: 135 | Fill #2

## 2017-05-07 MED FILL — PROPRANOLOL HCL ER 60 MG CP: 60 | 30 days supply | Qty: 30 | Fill #4

## 2017-06-05 MED FILL — CARBIDOPA-LEVODOPA 25-100 T: 25-100 | 30 days supply | Qty: 135 | Fill #3

## 2017-06-05 MED FILL — PROPRANOLOL HCL ER 60 MG CP: 60 | 30 days supply | Qty: 30 | Fill #5

## 2017-07-03 MED FILL — CARBIDOPA-LEVODOPA 25-100 T: 25-100 | 30 days supply | Qty: 135 | Fill #4

## 2017-07-03 MED FILL — PROPRANOLOL HCL ER 60 MG CP: 60 | 30 days supply | Qty: 30 | Fill #6

## 2017-08-01 MED FILL — PROPRANOLOL ER 60 MG CAP: 60 | 30 days supply | Qty: 30 | Fill #7

## 2017-08-01 MED FILL — CARBIDOPA-LEVODOPA 25-100 T: 25-100 | 30 days supply | Qty: 135 | Fill #5

## 2017-08-14 ENCOUNTER — Ambulatory Visit: Payer: Medicare Other | Admitting: Neurology

## 2017-08-15 ENCOUNTER — Encounter: Payer: Self-pay | Admitting: Neurology

## 2017-08-15 ENCOUNTER — Ambulatory Visit (INDEPENDENT_AMBULATORY_CARE_PROVIDER_SITE_OTHER): Payer: Medicare Other | Admitting: Neurology

## 2017-08-15 VITALS — BP 135/77 | HR 97 | Ht 69.0 in | Wt 183.0 lb

## 2017-08-15 DIAGNOSIS — G20 Parkinson's disease: Secondary | ICD-10-CM | POA: Diagnosis not present

## 2017-08-15 DIAGNOSIS — I480 Paroxysmal atrial fibrillation: Secondary | ICD-10-CM | POA: Diagnosis not present

## 2017-08-15 MED ORDER — CARBIDOPA-LEVODOPA 25-100 MG PO TABS: 1.5000 | ORAL_TABLET | Freq: Four times a day (QID) | ORAL | 3 refills | Status: DC

## 2017-08-15 MED FILL — CARBIDOPA-LEVODOPA 25-100 T: 25-100 | 30 days supply | Qty: 180 | Fill #0

## 2017-08-15 NOTE — Progress Notes (Signed)
Subjective:    Patient ID: Sean Ward is a 76 y.o. male.  HPI     Interim history:   Sean Ward is a 76 year old right-handed gentleman with an underlying medical history of bronchitis, PAF (for which he saw Dr. 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 The patient is unaccompanied today. Of note, he missed an appointment on 08/14/2017. I last saw him on 02/12/2017, at which time he reported doing okay, had a recent trip to [place]. He was trying to stay active, felt like his right leg was weaker than the left. 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 He had seen a chiropractor for this about 3 years prior. He was taking melatonin first jet lag. He was trying hydrate well. He felt that the Sinemet was working well for him, reported no side effects, was taking 1-1/2 pills 3 times a day. I suggested a six-month follow-up.  Today, 08/15/2017: He reports doing okay, but has not been as active. No falls, no significant constipation, no sleep issues, other than not always keeping a schedule. Likes to read. Feels like legs are heavier, balance not as good. Eats 2 meals per day, some snacks, mostly vegetarian, no eggs, some fish and poultry. Take C/L at 8, 2 or 3 PM and 7 PM. Had echo in 3/18, with good results. Has appt with PCP next week.  The patient's allergies, current medications, family history, past medical history, past social history, past surgical history and problem list were reviewed and updated as appropriate.    Previously (copied from previous notes for reference):    I saw him on 07/24/2016, at which time he reported tolerating the Sinemet and he felt that the tremor was less. He planned a trip to [place] for 6 days. He had no recent falls. He was not exercising on a regular basis. He reported a stable mood and memory. We talked about healthy lifestyle in the importance of  exercise. I suggested he increase his Sinemet to 1-1/2 pills 3 times a day.     I first met him on 06/06/2016 at the request of his primary care physician, at which time he reported an upper extremity tremor on the left frontal past 6-12 months and more recently in the right upper extremity. On examination, he had signs and keeping with parkinsonism, with left-sided predominance. I suggested we start him on Sinemet 25-100 milligrams strength half a pill twice a day with gradual titration to 1 pill 3 times a day. I ordered a brain MRI wo contrast. He had this on 06/18/16: IMPRESSION:  This MRI of the brain without contrast shows the following: 1.    Scattered T2/FLAIR hyperintense foci in the subcortical, deep and periventricular white matter in a pattern most consistent with mild chronic microvascular ischemic change.   None of the foci appears to be acute. 2.    There are no acute findings.   We called him with the results.    06/06/2016: He has had an upper extremity tremor for the past 6-12 months, unclear exactly how long and tremor started on the L, now also on the R side about a month ago. Leg tremors started about 2-3 months ago. 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 I reviewed your office note from 05/24/2016. You discontinued metoprolol and started him on propranolol long-acting 60 mg once daily. He is physically active, but not so  much lately. He plays tennis and rides a bike, but not able to lately.  He was a Human resources officer, still does some contract work.  He drinks wine with dinner, 1-2 glasses. Tremor is less post alcohol. He drinks coffee about 3 cups per day, otherwise water, about 8 glasses.  He has swelling of both LEs, for the past 6+ months, was on Lasix, but no longer. He has been on a baby ASA.  He lives alone in an apartment and has to navigate the one flight of stairs. [place] once about 3 months ago, in his apartment onto his L side, no head injury, no LOC, sleeps  more during the day in the afternoon, which helps. He is separated (going through divorce), has one daughter and one son, both live in [place], [place].  He is a non-smoker, no FHx of tremors, or PD. He feels like he has a tendency to fall backwards. He has only fallen once in the past year, onto his left side, thankfully no injury as mentioned. Memory is fairly good, he denies any mood related issues, he does worry about his tremor. He is planning an overseas trip by the end of September.   His Past Medical History Is Significant For: Past Medical History:  Diagnosis Date  . History of bronchitis     His Past Surgical History Is Significant For: Past Surgical History:  Procedure Laterality Date  . HERNIA REPAIR    . NOne    . TONSILLECTOMY      Her Family History Is Significant For: Family History  Problem Relation Age of Onset  . Heart disease Neg Hx     His Social History Is Significant For: Social History   Social History  . Marital status: Legally Separated    Spouse name: N/A  . Number of children: 2  . Years of education: BA   Social History Main Topics  . Smoking status: Never Smoker  . Smokeless tobacco: Never Used  . Alcohol use 4.2 oz/week    7 Glasses of wine per week     Comment: 1 glass of red wine with dinner  . Drug use: No  . Sexual activity: No   Other Topics Concern  . None   Social History Narrative   Drinks 2 cups of coffee a day     His Allergies Are:  No Known Allergies:   His Current Medications Are:  Outpatient Encounter Prescriptions as of 08/15/2017  Medication Sig  . carbidopa-levodopa (SINEMET IR) 25-100 MG tablet Take 1.5 tablets by mouth 3 (three) times daily. Take 1 1/2 pills 3 times a day, at 9 am, 2 pm and 7 pm.  . Multiple Vitamin (MULTIVITAMIN) tablet Take 1 tablet by mouth daily.  . propranolol ER (INDERAL LA) 60 MG 24 hr capsule Take 1 capsule (60 mg total) by mouth daily.  . furosemide (LASIX) 20 MG tablet Take 1 tablet (20 mg  total) by mouth daily. (Patient not taking: Reported on 08/15/2017)  . latanoprost (XALATAN) 0.005 % ophthalmic solution   . naproxen (NAPROSYN) 500 MG tablet Take 1 tablet (500 mg total) by mouth 2 (two) times daily with a meal. (Patient not taking: Reported on 08/15/2017)   No facility-administered encounter medications on file as of 08/15/2017.   :  Review of Systems:  Out of a complete 14 point review of systems, all are reviewed and negative with the exception of these symptoms as listed below: Review of Systems  Neurological:  Pt presents today to follow up on his PD. Pt reports things are going well.    Objective:  Neurological Exam  Physical Exam Physical Examination:   Vitals:   08/15/17 1452  BP: 135/77  Pulse: 97   General Examination: The patient is a very pleasant 76 y.o. male in no acute distress. He appears well-developed and well-nourished and well groomed.   HEENT: Normocephalic, atraumatic, pupils are equal, round and reactive to light and accommodation. Extraocular tracking Shows mild saccadic breakdown. He has mild facial masking, very slight left ptosis. Neck rigidity is about the same, overall mild to moderate. He has an intermittent lower lip and jaw tremor. He has no drooling. Hearing is grossly intact.   Chest: Clear to auscultation without wheezing, rhonchi or crackles noted.  Heart: S1+S2+0, irregular and may be in a fib.    Abdomen: Soft, non-tender and non-distended with normal bowel sounds appreciated on auscultation.  Extremities: There is 1+ edema in the left lower extremity, slight edema in the right lower extremity.   Skin: Warm and dry without trophic changes noted.  Musculoskeletal: exam reveals no obvious joint deformities, tenderness or joint swelling or erythema.   Neurologically:  Mental status: The patient is awake, alert and oriented in all 4 spheres. His immediate and remote memory, attention, language skills and fund of  knowledge are appropriate. There is no evidence of aphasia, agnosia, apraxia or anomia. Speech is mildly hypophonic, no dysarthria. Thought process is linear. Mood is normal and affect is normal.  Cranial nerves II - XII are as described above under HEENT exam.  Motor exam: Normal bulk,  and strength for age, tone is mildly increased, left more than right. He has mild resting tremor on the left upper extremity and an intermittent slight resting tremor in the right upper extremity, mild intermittent resting tremor in the left and right lower extremity. Romberg is negative. Reflexes are about 1+ throughout. Fine motor skills are mild to moderately impaired on the left and mildly so on the right. He stands with mild difficulty, posture is mildly stooped, he does have some evidence of scoliosis, right shoulder is lower than left. He walks with decreased stride length and pace, decreased arm swing bilaterally, left more than right. Tandem walk is not safe to test. Balance is overall minimally impaired.   Assessment and Plan:  In summary, Sean Ward is a very pleasant 76 year old male with an underlying medical history of bronchitis, PAF (followed by cardiologyin the past) and osteoarthritis, whopresents for follow up consultation of his parkinsonism, with his history and exam in keeping with left sided predominant Parkinson's disease. He does have bilateral findings, overall Mild progression. He is advised to try to keep well-hydrated and well-nourished. He has tendency to skip a meal per day, has lost a little bit of weight compared to earlier this year. He in A. Fib. He is advised to follow-up with his primary care physician, try to get in sooner than mid next week. May need to see cardiology again. He has not been seeing cardiology on a regular basis. He did have an echocardiogram in March 2018 which showed benign findings. I reviewed the test results. He is advised to Try to exercise on a regular basis,  avoid treadmill.  I suggested we increase the Sinemet to 1.5 pills 4 times a day at 8, 12, 4 and 8.  I adjusted his prescription for 3 months at a time. Of note, he had a brain MRI without contrast  on 06/18/16 which showed chronic white matter changes, no acute changes, no significant atrophy for age. I suggested a 6 month follow-up, sooner as needed. I answered all his questions today and he was in agreement.  I spent 25 minutes in total face-to-face time with the patient, more than 50% of which was spent in counseling and coordination of care, reviewing test results, reviewing medication and discussing or reviewing the diagnosis of PD, A fib, its prognosis and treatment options. Pertinent laboratory and imaging test results that were available during this visit with the patient were reviewed by me and considered in my medical decision making (see chart for details).

## 2017-08-15 NOTE — Patient Instructions (Addendum)
I think you should see your cardiologist, you may be in A fib (irregular heartbeat). Please talk to your family doctor about this. You may need to call his office for a sooner appt.   From my end of things, we are going to try to increase the Sinemet to  1 1/2 pills 4 times a day, at 8, 12, 4pm and 8pm.

## 2017-08-21 ENCOUNTER — Ambulatory Visit: Payer: Medicare Other | Attending: Internal Medicine | Admitting: Internal Medicine

## 2017-08-21 ENCOUNTER — Encounter: Payer: Self-pay | Admitting: Internal Medicine

## 2017-08-21 VITALS — BP 109/73 | HR 79 | Temp 97.6°F | Resp 18 | Ht 69.0 in | Wt 187.4 lb

## 2017-08-21 DIAGNOSIS — I481 Persistent atrial fibrillation: Secondary | ICD-10-CM | POA: Diagnosis not present

## 2017-08-21 DIAGNOSIS — G20 Parkinson's disease: Secondary | ICD-10-CM | POA: Diagnosis not present

## 2017-08-21 DIAGNOSIS — I10 Essential (primary) hypertension: Secondary | ICD-10-CM | POA: Insufficient documentation

## 2017-08-21 DIAGNOSIS — I4819 Other persistent atrial fibrillation: Secondary | ICD-10-CM

## 2017-08-21 DIAGNOSIS — G20C Parkinsonism, unspecified: Secondary | ICD-10-CM

## 2017-08-21 DIAGNOSIS — E785 Hyperlipidemia, unspecified: Secondary | ICD-10-CM

## 2017-08-21 NOTE — Progress Notes (Signed)
Sean Ward, is a 76 y.o. male  MHW:[id number]  [id number]  DOB - Apr 13, 1941  Chief Complaint  Patient presents with  . Atrial Fibrillation      Subjective:   Sean Ward is a 76 y.o. male with history of dependent bilateral lower limb edema on lasix prn, asymptomatic atrial fibrillation on aspirin, OA, bilateral inguinal hernia, parkinson's disease, and hypertension who presents here today for a follow up visit and medication refill. Patient has several questions about Aspirin, saying Web MD said there is long term complications associated with Aspirin, so he wants to stop taking it. He also wants a "permanent" solution to his Afib, although no specific symptom at this time. He really does not have any significant complaint today except for his chronic hip pain from osteoarthritis. He is yet to undergo herniorrhaphy because it is not yet convenient for him. Patient has No headache, No chest pain, No abdominal pain - No Nausea, No new weakness tingling or numbness, No Cough - SOB.  No problems updated.  ALLERGIES: No Known Allergies  PAST MEDICAL HISTORY: Past Medical History:  Diagnosis Date  . History of bronchitis     MEDICATIONS AT HOME: Prior to Admission medications   Medication Sig Start Date End Date Taking? Authorizing Provider  aspirin 325 MG tablet Take 325 mg by mouth daily.   Yes [provider]  carbidopa-levodopa (SINEMET IR) 25-100 MG tablet Take 1.5 tablets by mouth 4 (four) times daily. Take 1 1/2 pills 4 times a day, at 8 am, 12 pm and 4 pm and 8 PM. 08/15/17  Yes Star Age, MD  Multiple Vitamin (MULTIVITAMIN) tablet Take 1 tablet by mouth daily.   Yes [provider]  propranolol ER (INDERAL LA) 60 MG 24 hr capsule Take 1 capsule (60 mg total) by mouth daily. 12/12/16  Yes Tresa Garter, MD    Objective:   Vitals:   08/21/17 1411  BP: 109/73  Pulse: 79  Resp: 18  Temp: 97.6 F (36.4 C)  TempSrc: Oral  SpO2: 96%    Weight: 187 lb 6.4 oz (85 kg)  Height: '5\' 9"'  (1.753 m)   Exam General appearance : Awake, alert, not in any distress. Speech Clear. Not toxic looking HEENT: Atraumatic and Normocephalic, pupils equally reactive to light and accomodation Neck: Supple, no JVD. No cervical lymphadenopathy.  Chest: Good air entry bilaterally, no added sounds  CVS: S1 S2 regular, no murmurs.  Abdomen: Bowel sounds present, Non tender and not distended with no gaurding, rigidity or rebound. Extremities: B/L Lower Ext shows no edema, both legs are warm to touch Neurology: Awake alert, and oriented X 3, CN II-XII intact, Non focal Skin: No Rash  Data Review No results found for: HGBA1C  Assessment & Plan   1. 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HCC)  - CMP14+EGFR - TSH - Urinalysis, Complete - Ambulatory referral to Cardiology  2. Dyslipidemia  - Lipid panel  To address this please limit saturated fat to no more than 7% of your calories, limit cholesterol to 200 mg/day, increase fiber and exercise as tolerated. If needed we may add another cholesterol lowering medication to your regimen.   3. Primary Parkinsonism ([place])  - CMP14+EGFR - Continue follow up with Neurologist - Continue Sinemet-IR  Patient have been counseled extensively about nutrition and exercise. Other issues discussed during this visit include: low cholesterol diet, weight control and daily exercise, importance of adherence with medications and regular follow-up. We also discussed long term complications of uncontrolled  hypertension.   Return in about 6 months (around 02/18/2018) for Follow up HTN, afIB.  The patient was given clear instructions to go to ER or return to medical center if symptoms don't improve, worsen or new problems develop. The patient verbalized understanding. The patient was told to call to get lab results if they haven't heard anything in the next week.   This note has been created with Administrator. Any transcriptional errors are unintentional.    Angelica Chessman, MD, [place], [place], [place], [place] and [place], [place]   08/21/2017, 2:49 PM

## 2017-08-21 NOTE — Patient Instructions (Signed)
DASH Eating Plan DASH stands for "Dietary Approaches to Stop Hypertension." The DASH eating plan is a healthy eating plan that has been shown to reduce high blood pressure (hypertension). It may also reduce your risk for type 2 diabetes, heart disease, and stroke. The DASH eating plan may also help with weight loss. What are tips for following this plan? General guidelines  Avoid eating more than 2,300 mg (milligrams) of salt (sodium) a day. If you have hypertension, you may need to reduce your sodium intake to 1,500 mg a day.  Limit alcohol intake to no more than 1 drink a day for nonpregnant women and 2 drinks a day for men. One drink equals 12 oz of beer, 5 oz of wine, or 1 oz of hard liquor.  Work with your health care provider to maintain a healthy body weight or to lose weight. Ask what an ideal weight is for you.  Get at least 30 minutes of exercise that causes your heart to beat faster (aerobic exercise) most days of the week. Activities may include walking, swimming, or biking.  Work with your health care provider or diet and nutrition specialist (dietitian) to adjust your eating plan to your individual calorie needs. Reading food labels  Check food labels for the amount of sodium per serving. Choose foods with less than 5 percent of the Daily Value of sodium. Generally, foods with less than 300 mg of sodium per serving fit into this eating plan.  To find whole grains, look for the word "whole" as the first word in the ingredient list. Shopping  Buy products labeled as "low-sodium" or "no salt added."  Buy fresh foods. Avoid canned foods and premade or frozen meals. Cooking  Avoid adding salt when cooking. Use salt-free seasonings or herbs instead of table salt or sea salt. Check with your health care provider or pharmacist before using salt substitutes.  Do not fry foods. Cook foods using healthy methods such as baking, boiling, grilling, and broiling instead.  Cook with  heart-healthy oils, such as olive, canola, soybean, or sunflower oil. Meal planning   Eat a balanced diet that includes: ? 5 or more servings of fruits and vegetables each day. At each meal, try to fill half of your plate with fruits and vegetables. ? Up to 6-8 servings of whole grains each day. ? Less than 6 oz of lean meat, poultry, or fish each day. A 3-oz serving of meat is about the same size as a deck of cards. One egg equals 1 oz. ? 2 servings of low-fat dairy each day. ? A serving of nuts, seeds, or beans 5 times each week. ? Heart-healthy fats. Healthy fats called Omega-3 fatty acids are found in foods such as flaxseeds and coldwater fish, like sardines, salmon, and mackerel.  Limit how much you eat of the following: ? Canned or prepackaged foods. ? Food that is high in trans fat, such as fried foods. ? Food that is high in saturated fat, such as fatty meat. ? Sweets, desserts, sugary drinks, and other foods with added sugar. ? Full-fat dairy products.  Do not salt foods before eating.  Try to eat at least 2 vegetarian meals each week.  Eat more home-cooked food and less restaurant, buffet, and fast food.  When eating at a restaurant, ask that your food be prepared with less salt or no salt, if possible. What foods are recommended? The items listed may not be a complete list. Talk with your dietitian about what   dietary choices are best for you. Grains Whole-grain or whole-wheat bread. Whole-grain or whole-wheat pasta. Brown rice. Oatmeal. Quinoa. Bulgur. Whole-grain and low-sodium cereals. Pita bread. Low-fat, low-sodium crackers. Whole-wheat flour tortillas. Vegetables Fresh or frozen vegetables (raw, steamed, roasted, or grilled). Low-sodium or reduced-sodium tomato and vegetable juice. Low-sodium or reduced-sodium tomato sauce and tomato paste. Low-sodium or reduced-sodium canned vegetables. Fruits All fresh, dried, or frozen fruit. Canned fruit in natural juice (without  added sugar). Meat and other protein foods Skinless chicken or turkey. Ground chicken or turkey. Pork with fat trimmed off. Fish and seafood. Egg whites. Dried beans, peas, or lentils. Unsalted nuts, nut butters, and seeds. Unsalted canned beans. Lean cuts of beef with fat trimmed off. Low-sodium, lean deli meat. Dairy Low-fat (1%) or fat-free (skim) milk. Fat-free, low-fat, or reduced-fat cheeses. Nonfat, low-sodium ricotta or cottage cheese. Low-fat or nonfat yogurt. Low-fat, low-sodium cheese. Fats and oils Soft margarine without trans fats. Vegetable oil. Low-fat, reduced-fat, or light mayonnaise and salad dressings (reduced-sodium). Canola, safflower, olive, soybean, and sunflower oils. Avocado. Seasoning and other foods Herbs. Spices. Seasoning mixes without salt. Unsalted popcorn and pretzels. Fat-free sweets. What foods are not recommended? The items listed may not be a complete list. Talk with your dietitian about what dietary choices are best for you. Grains Baked goods made with fat, such as croissants, muffins, or some breads. Dry pasta or rice meal packs. Vegetables Creamed or fried vegetables. Vegetables in a cheese sauce. Regular canned vegetables (not low-sodium or reduced-sodium). Regular canned tomato sauce and paste (not low-sodium or reduced-sodium). Regular tomato and vegetable juice (not low-sodium or reduced-sodium). Pickles. Olives. Fruits Canned fruit in a light or heavy syrup. Fried fruit. Fruit in cream or butter sauce. Meat and other protein foods Fatty cuts of meat. Ribs. Fried meat. Bacon. Sausage. Bologna and other processed lunch meats. Salami. Fatback. Hotdogs. Bratwurst. Salted nuts and seeds. Canned beans with added salt. Canned or smoked fish. Whole eggs or egg yolks. Chicken or turkey with skin. Dairy Whole or 2% milk, cream, and half-and-half. Whole or full-fat cream cheese. Whole-fat or sweetened yogurt. Full-fat cheese. Nondairy creamers. Whipped toppings.  Processed cheese and cheese spreads. Fats and oils Butter. Stick margarine. Lard. Shortening. Ghee. Bacon fat. Tropical oils, such as coconut, palm kernel, or palm oil. Seasoning and other foods Salted popcorn and pretzels. Onion salt, garlic salt, seasoned salt, table salt, and sea salt. Worcestershire sauce. Tartar sauce. Barbecue sauce. Teriyaki sauce. Soy sauce, including reduced-sodium. Steak sauce. Canned and packaged gravies. Fish sauce. Oyster sauce. Cocktail sauce. Horseradish that you find on the shelf. Ketchup. Mustard. Meat flavorings and tenderizers. Bouillon cubes. Hot sauce and Tabasco sauce. Premade or packaged marinades. Premade or packaged taco seasonings. Relishes. Regular salad dressings. Where to find more information:  National Heart, Lung, and Blood Institute: www.nhlbi.nih.gov  American Heart Association: www.heart.org Summary  The DASH eating plan is a healthy eating plan that has been shown to reduce high blood pressure (hypertension). It may also reduce your risk for type 2 diabetes, heart disease, and stroke.  With the DASH eating plan, you should limit salt (sodium) intake to 2,300 mg a day. If you have hypertension, you may need to reduce your sodium intake to 1,500 mg a day.  When on the DASH eating plan, aim to eat more fresh fruits and vegetables, whole grains, lean proteins, low-fat dairy, and heart-healthy fats.  Work with your health care provider or diet and nutrition specialist (dietitian) to adjust your eating plan to your individual   calorie needs. This information is not intended to replace advice given to you by your health care provider. Make sure you discuss any questions you have with your health care provider. Document Released: 09/27/2011 Document Revised: 10/01/2016 Document Reviewed: 10/01/2016 Elsevier Interactive Patient Education  2017 Elsevier Inc. Atrial Fibrillation Atrial fibrillation is a type of heartbeat that is irregular or fast  (rapid). If you have this condition, your heart keeps quivering in a weird (chaotic) way. This condition can make it so your heart cannot pump blood normally. Having this condition gives a person more risk for stroke, heart failure, and other heart problems. There are different types of atrial fibrillation. Talk with your doctor to learn about the type that you have. Follow these instructions at home:  Take over-the-counter and prescription medicines only as told by your doctor.  If your doctor prescribed a blood-thinning medicine, take it exactly as told. Taking too much of it can cause bleeding. If you do not take enough of it, you will not have the protection that you need against stroke and other problems.  Do not use any tobacco products. These include cigarettes, chewing tobacco, and e-cigarettes. If you need help quitting, ask your doctor.  If you have apnea (obstructive sleep apnea), manage it as told by your doctor.  Do not drink alcohol.  Do not drink beverages that have caffeine. These include coffee, soda, and tea.  Maintain a healthy weight. Do not use diet pills unless your doctor says they are safe for you. Diet pills may make heart problems worse.  Follow diet instructions as told by your doctor.  Exercise regularly as told by your doctor.  Keep all follow-up visits as told by your doctor. This is important. Contact a doctor if:  You notice a change in the speed, rhythm, or strength of your heartbeat.  You are taking a blood-thinning medicine and you notice more bruising.  You get tired more easily when you move or exercise. Get help right away if:  You have pain in your chest or your belly (abdomen).  You have sweating or weakness.  You feel sick to your stomach (nauseous).  You notice blood in your throw up (vomit), poop (stool), or pee (urine).  You are short of breath.  You suddenly have swollen feet and ankles.  You feel dizzy.  Your suddenly get weak  or numb in your face, arms, or legs, especially if it happens on one side of your body.  You have trouble talking, trouble understanding, or both.  Your face or your eyelid droops on one side. These symptoms may be an emergency. Do not wait to see if the symptoms will go away. Get medical help right away. Call your local emergency services (911 in the [place] Do not drive yourself to the hospital. This information is not intended to replace advice given to you by your health care provider. Make sure you discuss any questions you have with your health care provider. Document Released: 07/17/2008 Document Revised: 03/15/2016 Document Reviewed: 02/02/2015 Elsevier Interactive Patient Education  2018 [employer]. Aspirin, ASA oral tablets What is this medicine? ASPIRIN (AS pir in) is a pain reliever. It is used to treat mild pain and fever. This medicine is also used as directed by a doctor to prevent and to treat heart attacks, to prevent strokes, and to treat arthritis or inflammation. This medicine may be used for other purposes; ask your health care provider or pharmacist if you have questions. COMMON BRAND NAME(S):  Aspir-Low, Aspir-Trin, Aspirtab, Bayer Advanced Aspirin, Bayer Aspirin, Bayer Aspirin Extra Strength, Bayer Aspirin Plus, Surveyor, quantity, Surveyor, quantity Plus, Bayer Genuine Aspirin, Bayer Womens Aspirin, Bufferin, Bufferin Extra Strength, Bufferin Low Dose What should I tell my health care provider before I take this medicine? They need to know if you have any of these conditions: -anemia -asthma -bleeding problems -child with chickenpox, the flu, or other viral infection -diabetes -gout -if you frequently drink alcohol containing drinks -kidney disease -liver disease -low level of vitamin K -lupus -smoke tobacco -stomach ulcers or other problems -an unusual or allergic reaction to aspirin, tartrazine dye, other medicines, dyes, or preservatives -pregnant or  trying to get pregnant -breast-feeding How should I use this medicine? Take this medicine by mouth with a glass of water. Follow the directions on the package or prescription label. You can take this medicine with or without food. If it upsets your stomach, take it with food. Do not take your medicine more often than directed. Talk to your pediatrician regarding the use of this medicine in children. While this drug may be prescribed for children as young as 45 years of age for selected conditions, precautions do apply. Children and teenagers should not use this medicine to treat chicken pox or flu symptoms unless directed by a doctor. Patients over 29 years old may have a stronger reaction and need a smaller dose. Overdosage: If you think you have taken too much of this medicine contact a poison control center or emergency room at once. NOTE: This medicine is only for you. Do not share this medicine with others. What if I miss a dose? If you are taking this medicine on a regular schedule and miss a dose, take it as soon as you can. If it is almost time for your next dose, take only that dose. Do not take double or extra doses. What may interact with this medicine? Do not take this medicine with any of the following medications: -cidofovir -ketorolac -probenecid This medicine may also interact with the following medications: -alcohol -alendronate -bismuth subsalicylate -flavocoxid -herbal supplements like feverfew, garlic, ginger, ginkgo biloba, horse chestnut -medicines for diabetes or glaucoma like acetazolamide, methazolamide -medicines for gout -medicines that treat or prevent blood clots like enoxaparin, heparin, ticlopidine, warfarin -other aspirin and aspirin-like medicines -NSAIDs, medicines for pain and inflammation, like ibuprofen or naproxen -pemetrexed -sulfinpyrazone -varicella live vaccine This list may not describe all possible interactions. Give your health care provider a  list of all the medicines, herbs, non-prescription drugs, or dietary supplements you use. Also tell them if you smoke, drink alcohol, or use illegal drugs. Some items may interact with your medicine. What should I watch for while using this medicine? If you are treating yourself for pain, tell your doctor or health care professional if the pain lasts more than 10 days, if it gets worse, or if there is a new or different kind of pain. Tell your doctor if you see redness or swelling. Also, check with your doctor if you have a fever that lasts for more than 3 days. Only take this medicine to prevent heart attacks or blood clotting if prescribed by your doctor or health care professional. Do not take aspirin or aspirin-like medicines with this medicine. Too much aspirin can be dangerous. Always read the labels carefully. This medicine can irritate your stomach or cause bleeding problems. Do not smoke cigarettes or drink alcohol while taking this medicine. Do not lie down for 30 minutes after taking  this medicine to prevent irritation to your throat. If you are scheduled for any medical or dental procedure, tell your healthcare provider that you are taking this medicine. You may need to stop taking this medicine before the procedure. This medicine may be used to treat migraines. If you take migraine medicines for 10 or more days a month, your migraines may get worse. Keep a diary of headache days and medicine use. Contact your healthcare professional if your migraine attacks occur more frequently. What side effects may I notice from receiving this medicine? Side effects that you should report to your doctor or health care professional as soon as possible: -allergic reactions like skin rash, itching or hives, swelling of the face, lips, or tongue -breathing problems -changes in hearing, ringing in the ears -confusion -general ill feeling or flu-like symptoms -pain on swallowing -redness, blistering, peeling  or loosening of the skin, including inside the mouth or nose -signs and symptoms of bleeding such as bloody or black, tarry stools; red or dark-brown urine; spitting up blood or brown material that looks like coffee grounds; red spots on the skin; unusual bruising or bleeding from the eye, gums, or nose -trouble passing urine or change in the amount of urine -unusually weak or tired -yellowing of the eyes or skin Side effects that usually do not require medical attention (report to your doctor or health care professional if they continue or are bothersome): -diarrhea or constipation -headache -nausea, vomiting -stomach gas, heartburn This list may not describe all possible side effects. Call your doctor for medical advice about side effects. You may report side effects to FDA at 1-800-FDA-1088. Where should I keep my medicine? Keep out of the reach of children. Store at room temperature between 15 and 30 degrees C (59 and 86 degrees F). Protect from heat and moisture. Do not use this medicine if it has a strong vinegar smell. Throw away any unused medicine after the expiration date. NOTE: This sheet is a summary. It may not cover all possible information. If you have questions about this medicine, talk to your doctor, pharmacist, or health care provider.  2018 Elsevier/Gold Standard (2013-06-09 11:30:31)

## 2017-08-21 NOTE — Progress Notes (Signed)
Patient states that he is having pain in his left lower hip.

## 2017-08-22 LAB — MICROSCOPIC EXAMINATION: Casts: NONE SEEN /lpf

## 2017-08-22 LAB — CMP14+EGFR
A/G RATIO: 1.6 (ref 1.2–2.2)
ALK PHOS: 114 IU/L (ref 39–117)
ALT: 11 IU/L (ref 0–44)
AST: 27 IU/L (ref 0–40)
Albumin: 3.7 g/dL (ref 3.5–4.8)
BUN/Creatinine Ratio: 17 (ref 10–24)
BUN: 19 mg/dL (ref 8–27)
Bilirubin Total: 0.7 mg/dL (ref 0.0–1.2)
CO2: 25 mmol/L (ref 20–29)
Calcium: 8.9 mg/dL (ref 8.6–10.2)
Chloride: 104 mmol/L (ref 96–106)
Creatinine, Ser: 1.12 mg/dL (ref 0.76–1.27)
GFR calc Af Amer: 74 mL/min/{1.73_m2} (ref >59)
GFR calc non Af Amer: 64 mL/min/{1.73_m2} (ref >59)
GLOBULIN, TOTAL: 2.3 g/dL (ref 1.5–4.5)
Glucose: 95 mg/dL (ref 65–99)
POTASSIUM: 4.7 mmol/L (ref 3.5–5.2)
SODIUM: 143 mmol/L (ref 134–144)
Total Protein: 6 g/dL (ref 6.0–8.5)

## 2017-08-22 LAB — URINALYSIS, COMPLETE
BILIRUBIN UA: NEGATIVE
Glucose, UA: NEGATIVE
KETONES UA: NEGATIVE
Leukocytes, UA: NEGATIVE
NITRITE UA: NEGATIVE
Protein, UA: NEGATIVE
RBC UA: NEGATIVE
Specific Gravity, UA: 1.023 (ref 1.005–1.030)
UUROB: 1 mg/dL (ref 0.2–1.0)
pH, UA: 6.5 (ref 5.0–7.5)

## 2017-08-22 LAB — LIPID PANEL
CHOLESTEROL TOTAL: 191 mg/dL (ref 100–199)
Chol/HDL Ratio: 3.8 ratio (ref 0.0–5.0)
HDL: 50 mg/dL (ref >39)
LDL Calculated: 118 mg/dL — ABNORMAL HIGH (ref 0–99)
TRIGLYCERIDES: 115 mg/dL (ref 0–149)
VLDL Cholesterol Cal: 23 mg/dL (ref 5–40)

## 2017-08-22 LAB — TSH: TSH: 2.27 u[IU]/mL (ref 0.450–4.500)

## 2017-08-23 ENCOUNTER — Telehealth (INDEPENDENT_AMBULATORY_CARE_PROVIDER_SITE_OTHER): Payer: Self-pay | Admitting: *Deleted

## 2017-08-23 NOTE — Telephone Encounter (Signed)
Patient verified DOB Patient is aware of labs being normal. Patient is aware of a printed copy being placed in the front desk for his pick up.

## 2017-08-23 NOTE — Telephone Encounter (Signed)
-----   Message from Quentin Angstlugbemiga E Jegede, MD sent at 08/22/2017  2:44 PM EDT ----- Labs are normal.

## 2017-08-25 NOTE — Telephone Encounter (Signed)
Yes please use lasix as needed for leg swelling

## 2017-08-28 ENCOUNTER — Other Ambulatory Visit: Payer: Self-pay | Admitting: Internal Medicine

## 2017-08-28 DIAGNOSIS — R251 Tremor, unspecified: Secondary | ICD-10-CM

## 2017-08-28 NOTE — Telephone Encounter (Signed)
Patient came into office requesting med refill on propranolol ER (INDERAL LA) 60 MG 24 hr capsule Please f/up

## 2017-09-02 ENCOUNTER — Ambulatory Visit: Payer: Medicare Other | Admitting: Physician Assistant

## 2017-09-03 NOTE — Telephone Encounter (Signed)
Left message on voicemail to return call.

## 2017-09-04 ENCOUNTER — Telehealth: Payer: Self-pay | Admitting: Internal Medicine

## 2017-09-04 NOTE — Telephone Encounter (Signed)
Left message on voicemail to return call; To relay message from PCP.: Use lasix as needed for leg swelling.

## 2017-09-04 NOTE — Telephone Encounter (Signed)
Patient called asking about labs I informed the patient that the labs where normal.

## 2017-09-06 MED FILL — PROPRANOLOL ER 60 MG CAP: 60 | 30 days supply | Qty: 30 | Fill #0

## 2017-09-27 MED FILL — CARBIDOPA-LEVODOPA 25-100 T: 25-100 | 30 days supply | Qty: 180 | Fill #1

## 2017-10-02 MED FILL — PROPRANOLOL HCL ER 60 MG CP: 60 | 30 days supply | Qty: 30 | Fill #1

## 2017-10-28 MED FILL — CARBIDOPA-LEVODOPA 25-100 T: 25-100 | 30 days supply | Qty: 180 | Fill #2

## 2017-10-29 ENCOUNTER — Ambulatory Visit (INDEPENDENT_AMBULATORY_CARE_PROVIDER_SITE_OTHER): Payer: Medicare Other | Admitting: Interventional Cardiology

## 2017-10-29 ENCOUNTER — Encounter: Payer: Self-pay | Admitting: Interventional Cardiology

## 2017-10-29 VITALS — BP 110/50 | HR 88 | Ht 69.0 in | Wt 186.8 lb

## 2017-10-29 DIAGNOSIS — Z9181 History of falling: Secondary | ICD-10-CM | POA: Diagnosis not present

## 2017-10-29 DIAGNOSIS — I4819 Other persistent atrial fibrillation: Secondary | ICD-10-CM

## 2017-10-29 DIAGNOSIS — R600 Localized edema: Secondary | ICD-10-CM

## 2017-10-29 DIAGNOSIS — I481 Persistent atrial fibrillation: Secondary | ICD-10-CM | POA: Diagnosis not present

## 2017-10-29 MED ORDER — APIXABAN 5 MG PO TABS: 5.0000 mg | ORAL_TABLET | Freq: Two times a day (BID) | ORAL | 11 refills | Status: DC

## 2017-10-29 MED FILL — !ELIQUIS 5MG TABLET: 5 | 30 days supply | Qty: 60 | Fill #0

## 2017-10-29 NOTE — Patient Instructions (Signed)
Medication Instructions:  Your physician has recommended you make the following change in your medication:   1. STOP ASA  2. START: Eliquis 5 mg twice a day  Labwork: Your physician recommends that you return for lab work in: 6 months for CBC, BMET  Testing/Procedures: None ordered  Follow-Up: Your physician wants you to follow-up in: 1 year with Dr. Eldridge DaceVaranasi. You will receive a reminder letter in the mail two months in advance. If you don't receive a letter, please call our office to schedule the follow-up appointment.   Any Other Special Instructions Will Be Listed Below (If Applicable).     If you need a refill on your cardiac medications before your next appointment, please call your pharmacy.

## 2017-10-29 NOTE — Progress Notes (Signed)
Cardiology Office Note   Date:  10/29/2017   ID:  Sean Ward, DOB Feb 27, 1941, MRN [id number]  PCP:  Quentin Angst, MD    No chief complaint on file.  AFib  Wt Readings from Last 3 Encounters:  10/29/17 186 lb 12.8 oz (84.7 kg)  08/21/17 187 lb 6.4 oz (85 kg)  08/15/17 183 lb (83 kg)       History of Present Illness: Sean Ward is a 77 y.o. male  who was diagnosed with atrial fibrillation in January 2016. He [facility]. He was asymptomatic when he initially was diagnosed with a heart rate of 115 220 bpm. His chads score was only 1 and therefore he was not started on anticoagulation. He was started on full dose aspirin. He was treated with Lasix for edema, but this did not seem to help the edema which was thought to be due to venous stasis and varicose veins.  Compression stockings have helped.  He has Parkinsons sx.  He is benefitting from the medication.  Denies : Chest pain. Dizziness. Nitroglycerin use. Orthopnea. Palpitations. Paroxysmal nocturnal dyspnea. Shortness of breath. Syncope.   He reports some pain in the back of his legs when he walks.  No recent falls, but has some unsteadiness in the past.  He has intermittent Leg edema.  Past Medical History:  Diagnosis Date  . History of bronchitis     Past Surgical History:  Procedure Laterality Date  . HERNIA REPAIR    . NOne    . TONSILLECTOMY       Current Outpatient Medications  Medication Sig Dispense Refill  . aspirin 325 MG tablet Take 325 mg by mouth daily.    . carbidopa-levodopa (SINEMET IR) 25-100 MG tablet Take 1.5 tablets by mouth 4 (four) times daily. Take 1 1/2 pills 4 times a day, at 8 am, 12 pm and 4 pm and 8 PM. 540 tablet 3  . latanoprost (XALATAN) 0.005 % ophthalmic solution Place 1 drop into both eyes at bedtime.    . Multiple Vitamin (MULTIVITAMIN) tablet Take 1 tablet by mouth daily.    . propranolol ER (INDERAL LA) 60  MG 24 hr capsule TAKE 1 CAPSULE BY MOUTH DAILY. 30 capsule 3   No current facility-administered medications for this visit.     Allergies:   Patient has no known allergies.    Social History:  The patient  reports that  has never smoked. he has never used smokeless tobacco. He reports that he drinks about 4.2 oz of alcohol per week. He reports that he does not use drugs.   Family History:  The patient's family history includes Deafness in his mother and sister.    ROS:  Please see the history of present illness.   Otherwise, review of systems are positive for intermittent weakness.   All other systems are reviewed and negative.    PHYSICAL EXAM: VS:  BP (!) 110/50   Pulse 88   Ht 5\' 9"  (1.753 m)   Wt 186 lb 12.8 oz (84.7 kg)   SpO2 97%   BMI 27.59 kg/m  , BMI Body mass index is 27.59 kg/m. GEN: Well nourished, well developed, in no acute distress  HEENT: normal  Neck: no JVD, carotid bruits, or masses Cardiac: irregularly irregular; no murmurs, rubs, or gallops,no edema  Respiratory:  clear to auscultation bilaterally, normal work of breathing GI: soft, nontender, nondistended, + BS MS: no  deformity or atrophy  Skin: warm and dry, no rash Neuro:  Strength and sensation are intact Psych: euthymic mood, full affect   EKG:   The ekg ordered today demonstrates AFib, rate controlled   Recent Labs: 08/21/2017: ALT 11; BUN 19; Creatinine, Ser 1.12; Potassium 4.7; Sodium 143; TSH 2.270   Lipid Panel    Component Value Date/Time   CHOL 191 08/21/2017 1503   TRIG 115 08/21/2017 1503   HDL 50 08/21/2017 1503   CHOLHDL 3.8 08/21/2017 1503   CHOLHDL 3.5 08/16/2016 1038   VLDL 24 08/16/2016 1038   LDLCALC 118 (H) 08/21/2017 1503     Other studies Reviewed: Additional studies/ records that were reviewed today with results demonstrating: LDL 118 in October 2018.   ASSESSMENT AND PLAN:  1. PAF:  Risk of stroke higher with age.  Rate controlled.  I recommended stopping  aspirin and starting a DOAC.  He is willing to try Eliquis.  I think this will be a better choice since he states he does not eat big meals and he also is traveling internationally in the next few months.  He will need CBC and electrolytes checked in 6 months. 2. LE edema: Elevate legs.  Continue use of compression stockings. 3. Fall risk: related to Parkinson's.  He has been steady on his feet.  No falls.  No bleeding problems. This patients CHA2DS2-VASc Score and unadjusted Ischemic Stroke Rate (% per year) is equal to 2.2 % stroke rate/year from a score of 2  Above score calculated as 1 point each if present [CHF, HTN, DM, Vascular=MI/PAD/Aortic Plaque, Age if 65-74, or Male] Above score calculated as 2 points each if present [Age > 75, or Stroke/TIA/TE]    Current medicines are reviewed at length with the patient today.  The patient concerns regarding his medicines were addressed.  The following changes have been made:  Start Eliquis  Labs/ tests ordered today include:  No orders of the defined types were placed in this encounter.   Recommend 150 minutes/week of aerobic exercise Low fat, low carb, high fiber diet recommended  Disposition:   FU in 1 year   Signed, Lance MussJayadeep Dimetrius Montfort, MD  10/29/2017 3:02 PM    [facility] [address], [place], [place]  [address] Phone: [phone]; Fax: [phone]

## 2017-10-30 MED FILL — PROPRANOLOL HCL ER 60 MG CP: 60 | 30 days supply | Qty: 30 | Fill #2

## 2017-11-29 ENCOUNTER — Other Ambulatory Visit: Payer: Self-pay | Admitting: Internal Medicine

## 2017-11-29 DIAGNOSIS — R251 Tremor, unspecified: Secondary | ICD-10-CM

## 2017-11-29 MED FILL — CARBIDOPA-LEVODOPA 25-100 T: 25-100 | 30 days supply | Qty: 180 | Fill #3

## 2017-11-29 MED FILL — PROPRANOLOL ER 60 MG CAP: 60 | 30 days supply | Qty: 30 | Fill #3

## 2017-12-02 ENCOUNTER — Other Ambulatory Visit: Payer: Self-pay | Admitting: Internal Medicine

## 2017-12-02 DIAGNOSIS — R251 Tremor, unspecified: Secondary | ICD-10-CM

## 2017-12-10 ENCOUNTER — Other Ambulatory Visit: Payer: Medicare Other

## 2018-01-01 ENCOUNTER — Other Ambulatory Visit: Payer: Self-pay | Admitting: Internal Medicine

## 2018-01-01 DIAGNOSIS — R251 Tremor, unspecified: Secondary | ICD-10-CM

## 2018-01-01 MED FILL — CARBIDOPA-LEVODOPA 25-100 T: 25-100 | 30 days supply | Qty: 180 | Fill #4

## 2018-01-01 MED FILL — PROPRANOLOL HCL ER 60 MG CP: 60 | 30 days supply | Qty: 30 | Fill #0

## 2018-01-06 ENCOUNTER — Other Ambulatory Visit: Payer: Medicare Other | Admitting: *Deleted

## 2018-01-06 DIAGNOSIS — R600 Localized edema: Secondary | ICD-10-CM | POA: Diagnosis not present

## 2018-01-06 DIAGNOSIS — Z9181 History of falling: Secondary | ICD-10-CM | POA: Diagnosis not present

## 2018-01-06 DIAGNOSIS — I4819 Other persistent atrial fibrillation: Secondary | ICD-10-CM

## 2018-01-06 DIAGNOSIS — I481 Persistent atrial fibrillation: Secondary | ICD-10-CM | POA: Diagnosis not present

## 2018-01-07 ENCOUNTER — Other Ambulatory Visit: Payer: Self-pay

## 2018-01-07 DIAGNOSIS — R251 Tremor, unspecified: Secondary | ICD-10-CM

## 2018-01-07 LAB — CBC
HEMOGLOBIN: 13 g/dL (ref 13.0–17.7)
Hematocrit: 39.9 % (ref 37.5–51.0)
MCH: 29.1 pg (ref 26.6–33.0)
MCHC: 32.6 g/dL (ref 31.5–35.7)
MCV: 89 fL (ref 79–97)
Platelets: 278 10*3/uL (ref 150–379)
RBC: 4.47 x10E6/uL (ref 4.14–5.80)
RDW: 16.1 % — ABNORMAL HIGH (ref 12.3–15.4)
WBC: 7.5 10*3/uL (ref 3.4–10.8)

## 2018-01-07 LAB — BASIC METABOLIC PANEL
BUN/Creatinine Ratio: 17 (ref 10–24)
BUN: 21 mg/dL (ref 8–27)
CALCIUM: 8.8 mg/dL (ref 8.6–10.2)
CHLORIDE: 104 mmol/L (ref 96–106)
CO2: 23 mmol/L (ref 20–29)
CREATININE: 1.21 mg/dL (ref 0.76–1.27)
GFR calc Af Amer: 67 mL/min/{1.73_m2} (ref >59)
GFR calc non Af Amer: 58 mL/min/{1.73_m2} — ABNORMAL LOW (ref >59)
GLUCOSE: 95 mg/dL (ref 65–99)
Potassium: 4.6 mmol/L (ref 3.5–5.2)
Sodium: 142 mmol/L (ref 134–144)

## 2018-01-07 MED ORDER — PROPRANOLOL HCL ER 60 MG PO CP24: 60.0000 mg | ORAL_CAPSULE | Freq: Every day | ORAL | 11 refills | Status: DC

## 2018-01-31 MED FILL — PROPRANOLOL ER 60 MG CAP: 60 | 30 days supply | Qty: 30 | Fill #0

## 2018-02-03 MED FILL — CARBIDOPA-LEVODOPA 25-100 T: 25-100 | 30 days supply | Qty: 180 | Fill #5

## 2018-02-13 ENCOUNTER — Encounter: Payer: Self-pay | Admitting: Neurology

## 2018-02-13 ENCOUNTER — Telehealth: Payer: Self-pay

## 2018-02-13 ENCOUNTER — Ambulatory Visit: Payer: Medicare Other | Admitting: Neurology

## 2018-02-13 NOTE — Telephone Encounter (Signed)
Pt did not show for their appt with Dr. Athar today.  

## 2018-02-20 ENCOUNTER — Telehealth: Payer: Self-pay | Admitting: Neurology

## 2018-02-20 MED FILL — ELIQUIS 5 MG TABLET: 5 | 30 days supply | Qty: 60 | Fill #1

## 2018-02-20 NOTE — Telephone Encounter (Signed)
Pt returning RNs call stating he only wanting to see the MD not a NP so will keep his appt for 6/24

## 2018-02-20 NOTE — Telephone Encounter (Signed)
Pt called said he is having problems with parkinson and is wanting to be seen sooner or should medication be increased. Please call to advise

## 2018-02-20 NOTE — Telephone Encounter (Signed)
Pt no-showed his appt with [place] on 02/13/18.   I called pt to offer him an appt with Eber Jones, NP on 02/24/18 at 7:45am.  No answer, left a message asking him to call me back. If pt calls back, please offer this appt to him.

## 2018-02-21 NOTE — Telephone Encounter (Signed)
I called pt, offered him an appt with Dr. Frances Furbish on 02/24/18 at 11:00am, check in at 10:30am, which pt agreed to and was very appreciative. Pt verbalized understanding of appt date and time.

## 2018-02-24 ENCOUNTER — Encounter: Payer: Self-pay | Admitting: Neurology

## 2018-02-24 ENCOUNTER — Ambulatory Visit (INDEPENDENT_AMBULATORY_CARE_PROVIDER_SITE_OTHER): Payer: Medicare Other | Admitting: Neurology

## 2018-02-24 VITALS — BP 128/86 | HR 92 | Ht 69.0 in | Wt 180.0 lb

## 2018-02-24 DIAGNOSIS — G20 Parkinson's disease: Secondary | ICD-10-CM

## 2018-02-24 MED ORDER — CARBIDOPA-LEVODOPA 25-100 MG PO TABS: 1.5000 | ORAL_TABLET | Freq: Four times a day (QID) | ORAL | 3 refills | Status: DC

## 2018-02-24 NOTE — Patient Instructions (Addendum)
Your exam from my standpoint is stable.  For your leg swelling: elevate your legs when possible. Use compression socks, drink plenty of water.  We will continue with the Sinemet 1 1/2 four times a day. It is absorbed best on an empty stomach.

## 2018-02-24 NOTE — Progress Notes (Signed)
Subjective:    Patient ID: Sean Ward is a 77 y.o. male.  HPI     Interim history:   Sean Ward is a 77 year old right-handed gentleman with an underlying medical history of bronchitis, PAF and osteoarthritis, who presents for follow-up consultation of his parkinsonism. The patient is unaccompanied today and had to miss an appointment on 02/13/2018. I last saw him on 08/15/2017, at which time he had no acute issues but overall was not as active. His balance was not as good. He felt that his legs are heavier. He was taking Sinemet one and half pill 3 times a day, I suggested we increase it to 4 times a day. His heart rate and cardiac auscultation suggested significant irregularities and I suggested he follow-up with cardiology.   Today, 02/24/2018: He reports doing okay, tries to stay active, thankfully no falls. Sometimes becomes dizzy and lightheaded but also his blood pressure tends to be on the lower end of normal. He is tolerating the Eliquis, has had lower extremity swelling, also right knee pain. He was advised by cardiology to use compression socks, he is not wearing them regularly. He tries to hydrate well. He still travels quite a bit, next trip is to [place], he is wondering if he can climb a Mountain (in [place]), from the cardiac standpoint, given the change in altitude. I suggested he discuss it with his cardiologist.   The patient's allergies, current medications, family history, past medical history, past social history, past surgical history and problem list were reviewed and updated as appropriate.    Previously (copied from previous notes for reference):  Of note, he missed an appointment on 08/14/2017. I saw him on 02/12/2017, at which time he reported doing okay, had a recent trip to [place]. He was trying to stay active, felt like his right leg was weaker than the left. 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 He had  seen a chiropractor for this about 3 years prior. He was taking melatonin first jet lag. He was trying hydrate well. He felt that the Sinemet was working well for him, reported no side effects, was taking 1-1/2 pills 3 times a day. I suggested a six-month follow-up.   I saw him on 07/24/2016, at which time he reported tolerating the Sinemet and he felt that the tremor was less. He planned a trip to [place] for 6 days. He had no recent falls. He was not exercising on a regular basis. He reported a stable mood and memory. We talked about healthy lifestyle in the importance of exercise. I suggested he increase his Sinemet to 1-1/2 pills 3 times a day.     I first met him on 06/06/2016 at the request of his primary care physician, at which time he reported an upper extremity tremor on the left frontal past 6-12 months and more recently in the right upper extremity. On examination, he had signs and keeping with parkinsonism, with left-sided predominance. I suggested we start him on Sinemet 25-100 milligrams strength half a pill twice a day with gradual titration to 1 pill 3 times a day. I ordered a brain MRI wo contrast. He had this on 06/18/16: IMPRESSION:  This MRI of the brain without contrast shows the following: 1.    Scattered T2/FLAIR hyperintense foci in the subcortical, deep and periventricular white matter in a pattern most consistent with mild chronic microvascular ischemic change.   None of the foci appears  to be acute. 2.    There are no acute findings.   We called him with the results.    06/06/2016: He has had an upper extremity tremor for the past 6-12 months, unclear exactly how long and tremor started on the L, now also on the R side about a month ago. Leg tremors started about 2-3 months ago. 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 I reviewed your office note from 05/24/2016. You discontinued metoprolol and started him on propranolol long-acting 60 mg once daily. He is physically active,  but not so much lately. He plays tennis and rides a bike, but not able to lately.  He was a Sean Ward, still does some contract work.  He drinks wine with dinner, 1-2 glasses. Tremor is less post alcohol. He drinks coffee about 3 cups per day, otherwise water, about 8 glasses.  He has swelling of both LEs, for the past 6+ months, was on Lasix, but no longer. He has been on a baby ASA.  He lives alone in an apartment and has to navigate the one flight of stairs. [place] once about 3 months ago, in his apartment onto his L side, no head injury, no LOC, sleeps more during the day in the afternoon, which helps. He is separated (going through divorce), has one daughter and one son, both live in [place], [place].  He is a non-smoker, no FHx of tremors, or PD. He feels like he has a tendency to fall backwards. He has only fallen once in the past year, onto his left side, thankfully no injury as mentioned. Memory is fairly good, he denies any mood related issues, he does worry about his tremor. He is planning an overseas trip by the end of September.   His Past Medical History Is Significant For: Past Medical History:  Diagnosis Date  . History of bronchitis     His Past Surgical History Is Significant For: Past Surgical History:  Procedure Laterality Date  . HERNIA REPAIR    . NOne    . TONSILLECTOMY      His Family History Is Significant For: Family History  Problem Relation Age of Onset  . Deafness Mother   . Deafness Sister   . Heart disease Neg Hx     His Social History Is Significant For: Social History   Socioeconomic History  . Marital status: Legally Separated    Spouse name: Not on file  . Number of children: 2  . Years of education: BA  . Highest education level: Not on file  Occupational History  . Not on file  Social Needs  . Financial resource strain: Not on file  . Food insecurity:    Worry: Not on file    Inability: Not on file  . Transportation needs:     Medical: Not on file    Non-medical: Not on file  Tobacco Use  . Smoking status: Never Smoker  . Smokeless tobacco: Never Used  Substance and Sexual Activity  . Alcohol use: Yes    Alcohol/week: 4.2 oz    Types: 7 Glasses of wine per week    Comment: 1 glass of red wine with dinner  . Drug use: No  . Sexual activity: Never  Lifestyle  . Physical activity:    Days per week: Not on file    Minutes per session: Not on file  . Stress: Not on file  Relationships  . Social connections:    Talks on phone:  Not on file    Gets together: Not on file    Attends religious service: Not on file    Active member of club or organization: Not on file    Attends meetings of clubs or organizations: Not on file    Relationship status: Not on file  Other Topics Concern  . Not on file  Social History Narrative   Drinks 2 cups of coffee a day     His Allergies Are:  No Known Allergies:   His Current Medications Are:  Outpatient Encounter Medications as of 02/24/2018  Medication Sig  . apixaban (ELIQUIS) 5 MG TABS tablet Take 1 tablet (5 mg total) by mouth 2 (two) times daily.  . carbidopa-levodopa (SINEMET IR) 25-100 MG tablet Take 1.5 tablets by mouth 4 (four) times daily. Take 1 1/2 pills 4 times a day, at 8 am, 12 pm and 4 pm and 8 PM.  . latanoprost (XALATAN) 0.005 % ophthalmic solution Place 1 drop into both eyes at bedtime.  . Multiple Vitamin (MULTIVITAMIN) tablet Take 1 tablet by mouth daily.  . propranolol ER (INDERAL LA) 60 MG 24 hr capsule Take 1 capsule (60 mg total) by mouth daily.   No facility-administered encounter medications on file as of 02/24/2018.   :  Review of Systems:  Out of a complete 14 point review of systems, all are reviewed and negative with the exception of these symptoms as listed below:  Review of Systems  Neurological:       Patient reports that he has felt some numbness in his fingers, but other than that he is doing ok.     Objective:  Neurological  Exam  Physical Exam Physical Examination:   Vitals:   02/24/18 1058  BP: 128/86  Pulse: 92    General Examination: The patient is a very pleasant 77 y.o. male in no acute distress. He appears well-developed and well-nourished and well groomed.   HEENT:Normocephalic, atraumatic, pupils are equal, round and reactive to light and accommodation. Extraocular tracking Shows mild saccadic breakdown. He has mild facial masking, very slight left ptosis. Neck rigidity is mild. He has an intermittent lower lip and jaw tremor. He has no drooling. Hearing is grossly intact.   Chest:Clear to auscultation without wheezing, rhonchi or crackles noted.  Heart:S1+S2+0, irregularly irreg. .    Abdomen:Soft, non-tender and non-distended with normal bowel sounds appreciated on auscultation.  Extremities:There is 2+edema in the distal lower extremities, left more than right, worse from last time.  Skin: Warm and dry without trophic changes noted.  Musculoskeletal: exam reveals no obvious joint deformities, tenderness or joint swelling or erythema, with the exception of tenderness in the right knee with mild swelling noted.   Neurologically:  Mental status: The patient is awake, alert and oriented in all 4 spheres. Hisimmediate and remote memory, attention, language skills and fund of knowledge are appropriate. There is no evidence of aphasia, agnosia, apraxia or anomia. Speech is mildly hypophonic, no dysarthria. Thought process is linear. Mood is normaland affect is normal.  Cranial nerves II - XII are as described above under HEENT exam.  Motor exam: Normal bulk, and strength for age, tone is mildly increased. He has a very mild resting tremor on the left upper extremity and an intermittent slight resting tremor in the right upper extremity, mild intermittent resting tremor in the left and right lower extremity. Romberg is negative. Reflexes are about 1+ throughout. Fine motor skills are mild  to moderately impaired on the left  and mildly so on the right. He stands with mild difficulty, posture is mildly stooped, he does have some evidence of scoliosis, right shoulder is lower than left. He walks with decreased stride length and pace, decreased arm swing bilaterally, left more than right. Tandem walk is not safe to test. Balance is overall minimally impaired. Slight limp on the right side noted today.  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 He does have bilateral findings, overall Mild progression with time. He is advised to try to keep well-hydrated and well-nourished. He has been started on Eliquis and follows with cardiology. He is advised to continue with Sinemet 1-1/2 pills 4 times a day. He is advised to start using compression stockings to his lower extremities and also to follow-up with regards to his right knee pain, he will be seeing a new primary care physician soon. Far as lightheadedness, this could be secondary to suboptimal hydration, medication effect from the beta blocker, having Parkinson's disease and some autonomic dysfunction related to the PD. He is advised to stay well-hydrated and change positions slowly. Of note, he had a brain MRI without contrast on 8/28/17which showed chronic white matter changes, no acute changes, no significant atrophy for age. I suggested a 6 month follow-up, sooner as needed. I answered all his questions today and He was in agreement. I spent 25 minutes in total face-to-face time with the patient, more than 50% of which was spent in counseling and coordination of care, reviewing test results, reviewing medication and discussing or reviewing the  diagnosis of PD, its prognosis and treatment options. Pertinent laboratory and imaging test results that were available during this visit with the patient were reviewed by me and considered in my medical decision making (see chart for details).

## 2018-02-25 MED FILL — CARBIDOPA/LEVO 25/100 TAB: 25-100 | 30 days supply | Qty: 180 | Fill #0

## 2018-02-28 ENCOUNTER — Encounter: Payer: Self-pay | Admitting: Family Medicine

## 2018-02-28 ENCOUNTER — Ambulatory Visit: Payer: Medicare Other | Attending: Family Medicine | Admitting: Family Medicine

## 2018-02-28 VITALS — BP 99/64 | HR 77 | Temp 98.0°F | Ht 69.0 in | Wt 176.6 lb

## 2018-02-28 DIAGNOSIS — M549 Dorsalgia, unspecified: Secondary | ICD-10-CM | POA: Diagnosis present

## 2018-02-28 DIAGNOSIS — G20 Parkinson's disease: Secondary | ICD-10-CM | POA: Diagnosis not present

## 2018-02-28 DIAGNOSIS — M5442 Lumbago with sciatica, left side: Secondary | ICD-10-CM | POA: Insufficient documentation

## 2018-02-28 DIAGNOSIS — G20A1 Parkinson's disease without dyskinesia, without mention of fluctuations: Secondary | ICD-10-CM

## 2018-02-28 DIAGNOSIS — Z79899 Other long term (current) drug therapy: Secondary | ICD-10-CM | POA: Insufficient documentation

## 2018-02-28 DIAGNOSIS — M5432 Sciatica, left side: Secondary | ICD-10-CM

## 2018-02-28 DIAGNOSIS — I480 Paroxysmal atrial fibrillation: Secondary | ICD-10-CM

## 2018-02-28 MED ORDER — CETIRIZINE HCL 10 MG PO TABS
10.0000 mg | ORAL_TABLET | Freq: Every day | ORAL | 1 refills | Status: DC
Start: 2018-02-28 — End: 2019-02-24

## 2018-02-28 MED ORDER — TIZANIDINE HCL 4 MG PO TABS: 4.0000 mg | ORAL_TABLET | Freq: Three times a day (TID) | ORAL | 1 refills | Status: DC | PRN

## 2018-02-28 NOTE — Progress Notes (Signed)
Subjective:  Patient ID: Sean Ward, male    DOB: 1941-09-03  Age: 77 y.o. MRN: [id number]  CC: Establish Care and Back Pain   HPI Sean Ward is a 77 year old male with a history of Parkinson's, A.fib here to establish care with me. He takes Sinemet for Parkinson's and is doing well on it and is followed by Neurology with his last visit 4 days ago. His A.fib is stable and he denies dyspnea, chest pain, palpitations. He complains of left sided lower back pain which radiates down the left lower extremity on rising from a seated position and on reaching out with his left hand for the last 1-2 months. He denies numbness in extremities or falls Pain is an 8/10. Also states he has had some nasal congestion and may have a "summer cold". Advised to use Zyrtec  Past Medical History:  Diagnosis Date  . History of bronchitis     Past Surgical History:  Procedure Laterality Date  . HERNIA REPAIR    . NOne    . TONSILLECTOMY      No Known Allergies   Outpatient Medications Prior to Visit  Medication Sig Dispense Refill  . apixaban (ELIQUIS) 5 MG TABS tablet Take 1 tablet (5 mg total) by mouth 2 (two) times daily. 60 tablet 11  . carbidopa-levodopa (SINEMET IR) 25-100 MG tablet Take 1.5 tablets by mouth 4 (four) times daily. Take 1 1/2 pills 4 times a day, at 8 am, 12 pm and 4 pm and 8 PM. 90 day Rx please 540 tablet 3  . latanoprost (XALATAN) 0.005 % ophthalmic solution Place 1 drop into both eyes at bedtime.    . Multiple Vitamin (MULTIVITAMIN) tablet Take 1 tablet by mouth daily.    . propranolol ER (INDERAL LA) 60 MG 24 hr capsule Take 1 capsule (60 mg total) by mouth daily. 30 capsule 11   No facility-administered medications prior to visit.     ROS Review of Systems  Constitutional: Negative for activity change and appetite change.  HENT: Negative for sinus pressure and sore throat.   Eyes: Negative for visual disturbance.  Respiratory: Negative for cough, chest  tightness and shortness of breath.   Cardiovascular: Negative for chest pain and leg swelling.  Gastrointestinal: Negative for abdominal distention, abdominal pain, constipation and diarrhea.  Endocrine: Negative.   Genitourinary: Negative for dysuria.  Musculoskeletal: Positive for back pain. Negative for joint swelling and myalgias.  Skin: Negative for rash.  Allergic/Immunologic: Negative.   Neurological: Negative for weakness, light-headedness and numbness.  Psychiatric/Behavioral: Negative for dysphoric mood and suicidal ideas.    Objective:  BP 99/64   Pulse 77   Temp 98 F (36.7 C) (Oral)   Ht  (1.753 m)   Wt 176 lb 9.6 oz (80.1 kg)   SpO2 97%   BMI 26.08 kg/m   BP/Weight 02/28/2018 02/24/2018 10/29/2017  Systolic BP 99 128 110  Diastolic BP 64 86 50  Wt. (Lbs) 176.6 180 186.8  BMI 26.08 26.58 27.59      Physical Exam  Constitutional: He is oriented to person, place, and time. He appears well-developed and well-nourished.  Cardiovascular: Normal rate, normal heart sounds and intact distal pulses.  No murmur heard. Pulmonary/Chest: Effort normal and breath sounds normal. He has no wheezes. He has no rales. He exhibits no tenderness.  Abdominal: Soft. Bowel sounds are normal. He exhibits no distension and no mass. There is no tenderness.  Musculoskeletal: Normal range of motion. He exhibits  tenderness (slight left lumbosacral TTP; negative straight leg raise bilaterally).  Neurological: He is alert and oriented to person, place, and time.  Skin: Skin is warm and dry.  Psychiatric: He has a normal mood and affect.     Assessment & Plan:   1. Sciatica of left side Placed on Tizanidine Apply heat  2. Paroxysmal atrial fibrillation (HCC) Currently in sinus rhythm Continue propranolol  3. Parkinson disease (HCC) Doing well on Sinemet Follow up with Neurology   Meds ordered this encounter  Medications  . tiZANidine (ZANAFLEX) 4 MG tablet    Sig: Take 1  tablet (4 mg total) by mouth every 8 (eight) hours as needed for muscle spasms.    Dispense:  60 tablet    Refill:  1  . cetirizine (ZYRTEC) 10 MG tablet    Sig: Take 1 tablet (10 mg total) by mouth daily.    Dispense:  30 tablet    Refill:  1    Follow-up: Return in about 6 months (around 08/31/2018) for Follow-up of chronic medical conditions.   Hoy Register MD

## 2018-02-28 NOTE — Patient Instructions (Addendum)
Sciatica Sciatica is pain, numbness, weakness, or tingling along your sciatic nerve. The sciatic nerve starts in the lower back and goes down the back of each leg. Sciatica happens when this nerve is pinched or has pressure put on it. Sciatica usually goes away on its own or with treatment. Sometimes, sciatica may keep coming back (recur). Follow these instructions at home: Medicines  Take over-the-counter and prescription medicines only as told by your doctor.  Do not drive or use heavy machinery while taking prescription pain medicine. Managing pain  If directed, put ice on the affected area. ? Put ice in a plastic bag. ? Place a towel between your skin and the bag. ? Leave the ice on for 20 minutes, 2-3 times a day.  After icing, apply heat to the affected area before you exercise or as often as told by your doctor. Use the heat source that your doctor tells you to use, such as a moist heat pack or a heating pad. ? Place a towel between your skin and the heat source. ? Leave the heat on for 20-30 minutes. ? Remove the heat if your skin turns bright red. This is especially important if you are unable to feel pain, heat, or cold. You may have a greater risk of getting burned. Activity  Return to your normal activities as told by your doctor. Ask your doctor what activities are safe for you. ? Avoid activities that make your sciatica worse.  Take short rests during the day. Rest in a lying or standing position. This is usually better than sitting to rest. ? When you rest for a long time, do some physical activity or stretching between periods of rest. ? Avoid sitting for a long time without moving. Get up and move around at least one time each hour.  Exercise and stretch regularly, as told by your doctor.  Do not lift anything that is heavier than 10 lb (4.5 kg) while you have symptoms of sciatica. ? Avoid lifting heavy things even when you do not have symptoms. ? Avoid lifting heavy  things over and over.  When you lift objects, always lift in a way that is safe for your body. To do this, you should: ? Bend your knees. ? Keep the object close to your body. ? Avoid twisting. General instructions  Use good posture. ? Avoid leaning forward when you are sitting. ? Avoid hunching over when you are standing.  Stay at a healthy weight.  Wear comfortable shoes that support your feet. Avoid wearing high heels.  Avoid sleeping on a mattress that is too soft or too hard. You might have less pain if you sleep on a mattress that is firm enough to support your back.  Keep all follow-up visits as told by your doctor. This is important. Contact a doctor if:  You have pain that: ? Wakes you up when you are sleeping. ? Gets worse when you lie down. ? Is worse than the pain you have had in the past. ? Lasts longer than 4 weeks.  You lose weight for without trying. Get help right away if:  You cannot control when you pee (urinate) or poop (have a bowel movement).  You have weakness in any of these areas and it gets worse. ? Lower back. ? Lower belly (pelvis). ? Butt (buttocks). ? Legs.  You have redness or swelling of your back.  You have a burning feeling when you pee. This information is not intended to replace   advice given to you by your health care provider. Make sure you discuss any questions you have with your health care provider. Document Released: 07/17/2008 Document Revised: 03/15/2016 Document Reviewed: 06/17/2015 Elsevier Interactive Patient Education  2018 [employer]  

## 2018-03-01 ENCOUNTER — Encounter: Payer: Self-pay | Admitting: Family Medicine

## 2018-03-03 MED FILL — PROPRANOLOL ER 60 MG CAP: 60 | 30 days supply | Qty: 30 | Fill #1

## 2018-03-18 ENCOUNTER — Ambulatory Visit: Payer: Medicare Other | Admitting: Family Medicine

## 2018-03-25 MED FILL — !ELIQUIS 5MG TABLET: 5 | 30 days supply | Qty: 60 | Fill #2

## 2018-03-28 MED FILL — PROPRANOLOL ER 60 MG CAP: 60 | 30 days supply | Qty: 30 | Fill #2

## 2018-04-02 MED FILL — CARBIDOPA-LEVODOPA 25-100 T: 25-100 | 30 days supply | Qty: 180 | Fill #1

## 2018-04-14 ENCOUNTER — Encounter

## 2018-04-14 ENCOUNTER — Ambulatory Visit: Payer: Medicare Other | Admitting: Neurology

## 2018-04-23 MED FILL — !ELIQUIS 5MG TABLET: 5 | 30 days supply | Qty: 60 | Fill #3

## 2018-04-25 MED FILL — PROPRANOLOL ER 60 MG CAP: 60 | 30 days supply | Qty: 30 | Fill #3

## 2018-05-02 MED FILL — CARBIDOPA-LEVODOPA 25-100 T: 25-100 | 30 days supply | Qty: 180 | Fill #2

## 2018-05-09 ENCOUNTER — Telehealth: Payer: Self-pay

## 2018-05-09 NOTE — Telephone Encounter (Signed)
CALLED TO INVITE PATIENT TO SCHEDULE THEIR MEDICARE ANNUAL WELLNESS VISIT WITH US AT [facility], LEFT NUMBER FOR FRONT OFFICE TO SCHEDULE THEIR APPT 

## 2018-05-22 MED FILL — $ELIQUIS 5 MG TABLET: 5 | 90 days supply | Qty: 180 | Fill #4

## 2018-05-22 MED FILL — PROPRANOLOL ER 60 MG CAP: 60 | 30 days supply | Qty: 30 | Fill #4

## 2018-05-28 ENCOUNTER — Ambulatory Visit: Payer: Medicare Other | Admitting: Family Medicine

## 2018-05-28 ENCOUNTER — Ambulatory Visit: Payer: Medicare Other | Admitting: Pharmacist

## 2018-06-03 ENCOUNTER — Ambulatory Visit: Payer: Medicare Other | Admitting: Pharmacist

## 2018-06-04 MED FILL — CARBIDOPA-LEVODOPA 25-100 T: 25-100 | 30 days supply | Qty: 180 | Fill #3

## 2018-06-30 ENCOUNTER — Ambulatory Visit: Payer: Medicare Other | Attending: Family Medicine | Admitting: Family Medicine

## 2018-06-30 ENCOUNTER — Other Ambulatory Visit: Payer: Self-pay

## 2018-06-30 ENCOUNTER — Encounter: Payer: Self-pay | Admitting: Family Medicine

## 2018-06-30 VITALS — BP 113/76 | HR 73 | Temp 98.1°F | Resp 18 | Ht 68.0 in | Wt 172.0 lb

## 2018-06-30 DIAGNOSIS — G20 Parkinson's disease: Secondary | ICD-10-CM

## 2018-06-30 DIAGNOSIS — I481 Persistent atrial fibrillation: Secondary | ICD-10-CM

## 2018-06-30 DIAGNOSIS — M4125 Other idiopathic scoliosis, thoracolumbar region: Secondary | ICD-10-CM

## 2018-06-30 DIAGNOSIS — M47814 Spondylosis without myelopathy or radiculopathy, thoracic region: Secondary | ICD-10-CM | POA: Diagnosis not present

## 2018-06-30 DIAGNOSIS — M47816 Spondylosis without myelopathy or radiculopathy, lumbar region: Secondary | ICD-10-CM | POA: Insufficient documentation

## 2018-06-30 DIAGNOSIS — Z79899 Other long term (current) drug therapy: Secondary | ICD-10-CM

## 2018-06-30 DIAGNOSIS — M79605 Pain in left leg: Secondary | ICD-10-CM | POA: Diagnosis not present

## 2018-06-30 DIAGNOSIS — M545 Low back pain, unspecified: Secondary | ICD-10-CM

## 2018-06-30 DIAGNOSIS — G20A1 Parkinson's disease without dyskinesia, without mention of fluctuations: Secondary | ICD-10-CM

## 2018-06-30 DIAGNOSIS — Z7901 Long term (current) use of anticoagulants: Secondary | ICD-10-CM | POA: Diagnosis not present

## 2018-06-30 DIAGNOSIS — I4819 Other persistent atrial fibrillation: Secondary | ICD-10-CM

## 2018-06-30 DIAGNOSIS — G8929 Other chronic pain: Secondary | ICD-10-CM | POA: Diagnosis not present

## 2018-06-30 DIAGNOSIS — M5440 Lumbago with sciatica, unspecified side: Secondary | ICD-10-CM

## 2018-06-30 DIAGNOSIS — Z23 Encounter for immunization: Secondary | ICD-10-CM | POA: Diagnosis not present

## 2018-06-30 DIAGNOSIS — E785 Hyperlipidemia, unspecified: Secondary | ICD-10-CM | POA: Diagnosis not present

## 2018-06-30 MED FILL — CARBIDOPA-LEVODOPA 25-100 T: 25-100 | 30 days supply | Qty: 180 | Fill #4

## 2018-06-30 MED FILL — PROPRANOLOL ER 60 MG CAP: 60 | 30 days supply | Qty: 30 | Fill #5

## 2018-06-30 NOTE — Progress Notes (Signed)
Flu shot, Pain left lower back more than a month pulling sharp. Back of both knees, lef t hand reach pain from back to knee. No refills needed today.

## 2018-06-30 NOTE — Patient Instructions (Signed)
Scoliosis Scoliosis is the name given to a spine that curves sideways.Scoliosis can cause twisting of your shoulders, hips, chest, back, and rib cage. What are the causes? The cause of scoliosis is not always known. It may be caused by a birth defect or by a disease that can cause muscular dysfunction and imbalance, such as cerebral palsy and muscular dystrophy. What increases the risk? Having a disease that causes muscle disease or dysfunction. What are the signs or symptoms? Scoliosis often has no signs or symptoms.If they are present, they may include:  Unequal size of one body side compared to the other (asymmetry).  Visible curvature of the spine.  Pain. The pain may limit physical activity.  Shortness of breath.  Bowel or bladder issues.  How is this diagnosed? A skilled health care provider will perform an evaluation. This will involve:  Taking your history.  Performing a physical examination.  Performing a neurological exam to detect nerve or muscle function loss.  Range of motion studies on the spine.  X-rays.  An MRI may also be obtained. How is this treated? Treatment varies depending on the nature, extent, and severity of the disease. If the curvature is not great, you may need only observation. A brace may be used to prevent scoliosis from progressing. A brace may also be needed during growth spurts. Physical therapy may be of benefit. Surgery may be required. Follow these instructions at home:  Your health care provider may suggest exercises to strengthen your muscles. Perform them as directed.  Ask your health care provider before participating in any sports.  If you have been prescribed an orthopedic brace, wear it as instructed by your health care provider. Contact a health care provider if: Your brace causes the skin to become sore (chafe) or is uncomfortable. Get help right away if:  You have back pain that is not relieved by the medicines prescribed  by your health care provider.  Your legs feel weak or you lose function in your legs.  You lose some bowel or bladder control. This information is not intended to replace advice given to you by your health care provider. Make sure you discuss any questions you have with your health care provider. Document Released: 10/05/2000 Document Revised: 03/15/2016 Document Reviewed: 04/12/2016 Elsevier Interactive Patient Education  2018 [employer]  

## 2018-06-30 NOTE — Progress Notes (Signed)
Subjective:    Patient ID: Sean Ward, male    DOB: 27-Apr-1941, 77 y.o.   MRN: [id number]  HPI       77 year old male who presents in follow-up of low back pain with radiation down the left leg.  Patient also with persistent atrial fibrillation with chronic anticoagulation with Eliquis, dyslipidemia and Parkinson's disease.  Patient reports his major issue as the long-standing low back pain along with radiation of pain down his left leg.  Patient states that if he has been setting and then tries to get up, he gets a sharp pain in his left lower back.  Patient states that it takes him a few steps before he can straighten up.  Patient states that the pain in his back is a 10 when he takes his first few steps.  Patient states that the pain that radiates down his leg is a 7.  Patient reports that the back pain is a dull aching sensation with occasional sharpness.  Patient states it is difficult to describe the pain that is going down his leg.  Patient states that this back pain and radiation of pain have been occurring for years but he believes that both are worsening.      Patient with a history of Parkinson's and patient is currently on Sinemet.  Patient does have upcoming appointment with his neurologist.  Patient feels that his Parkinson's is stable at this time.  Patient reports no falls but does have occasional issues with his balance.  Patient reports no sensation of chest pain, increased shortness of breath and no sensation of palpitations associated with his atrial fibrillation.  Patient does continue to take his Eliquis daily.  Patient denies any unusual bleeding and has had no recent issues with bruising.  Patient denies any issues with headaches or dizziness, no chest pain or palpitations, no shortness of breath or cough.  Patient denies any peripheral edema.  Patient has been having some discomfort in the back of his knees.  Patient states that this is been a long-standing issue.  Patient also  did have some swelling in his legs in the past.  Patient states that he was asked to have ultrasounds of his legs done by his past doctor but patient states that he never had these done.  Patient states that he did try compression hose but got tired of trying to put these on a daily basis and he no longer wears them.  Patient reports no recent significant issues with leg swelling. Past Medical History:  Diagnosis Date  . Anticoagulant long-term use   . Back pain with radiation   . History of bronchitis   . Parkinson's disease (HCC)   . 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 Scoliosis of cervicothoracic spine   . Scoliosis of thoracolumbar spine   . Spondylosis of lumbar spine    Past Surgical History:  Procedure Laterality Date  . HERNIA REPAIR    . NOne    . TONSILLECTOMY     Family History  Problem Relation Age of Onset  . Deafness Mother   . Deafness Sister   . Heart disease Neg Hx    Social History   Tobacco Use  . Smoking status: Never Smoker  . Smokeless tobacco: Never Used  Substance Use Topics  . Alcohol use: Yes    Alcohol/week: 7.0 standard drinks    Types: 7 Glasses of wine per week    Comment: 1 glass of red wine with dinner  .  Drug use: No    Review of Systems  Constitutional: Negative for chills, fatigue and fever.  HENT: Negative for sore throat and trouble swallowing.   Respiratory: Negative for cough and shortness of breath.   Cardiovascular: Negative for chest pain, palpitations and leg swelling.  Gastrointestinal: Negative for abdominal pain and nausea.  Genitourinary: Negative for dysuria and frequency.  Musculoskeletal: Positive for back pain. Negative for joint swelling.  Neurological: Negative for dizziness and headaches.       Objective:   Physical Exam BP 113/76   Pulse 73   Temp 98.1 F (36.7 C) (Oral)   Resp 18   Ht 5\' 8"  (1.727 m)   Wt 172 lb (78 kg)   SpO2 97%   BMI 26.15 kg/m Vital signs and nursing note  reviewed General-well-nourished, well-developed older male in no acute distress Neck-supple, no lymphadenopathy, no thyromegaly, no carotid bruit Cardiovascular- irregularly irregular rhythm, normal rate Lungs-clear to auscultation bilaterally Abdomen-soft, nontender Back-no CVA tenderness.  Patient does have cervicothoracic and thoracolumbar scoliosis.  Patient with tenderness to palpation in the left lower back which may actually represent the iliac crest due to some rotation.  Patient with mild radiation of pain to the left leg just above the knee with straight leg raise Extremities-no edema      Assessment & Plan:  1. Low back pain with radiation Patient with complaint of persistent/chronic low back pain with radiation.  Patient does have scoliosis and I suspect that he has some back discomfort secondary to rotational changes as well as possible degenerative disc disease.  Patient will be referred to orthopedics for further evaluation and treatment.  Patient may take Tylenol as needed for pain but should not take nonsteroidal anti-inflammatories as he is chronically on anticoagulation secondary to atrial fibrillation.  Discussed a trial of prednisone taper but patient is afraid of medication side effects/interactions with other medications and does not wish to try prednisone at this time. - Ambulatory referral to Orthopedic Surgery  2. Other idiopathic scoliosis, thoracolumbar region Patient with presence of scoliosis both cervicothoracic and thoracolumbar scoliosis.  On review of chart, patient has had no imaging since 2015 of the spine but x-ray report does state moderate degenerative changes in thoracic spine along with presence of S-shaped thoracic scoliosis.  Patient's lumbar x-ray showed multilevel disc disease, right convex lumbar scoliosis with associated degenerative lumbar spondylosis. - Ambulatory referral to Orthopedic Surgery  3. 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HCC) Patient is  on Eliquis for persistent atrial fibrillation and patient will have CBC in follow-up of use of anticoagulant.  Patient denies any unusual bruising or bleeding.  Patient's rate is currently controlled. - CBC with Differential; Future  4. Dyslipidemia Per medical records, patient with history of dyslipidemia.  Patient's last lipid panel was in October 2018.  LDL was 118.  Patient is nonfasting at today's visit and will return for fasting lipid panel - Lipid panel; Future  5. Parkinson disease [facility]) Patient with Parkinson's disease for which he is currently on Sinemet.  Patient reports occasional balance issues but otherwise feels that he is doing well.  Patient will have CMP in follow-up of medication use at his upcoming lab  visit - Comprehensive metabolic panel; Future  6. Long-term use of high-risk medication Patient will return for fasting blood work including CMP in follow-up of high risk medication use - Comprehensive metabolic panel; Future  7. Anticoagulant long-term use Patient will have CBC with future labs in follow-up of use of Eliquis for anticoagulation  secondary to persistent atrial fibrillation - CBC with Differential; Future  8. Need for influenza vaccination Patient was offered and agreed to have influenza immunization at today's visit.  Patient received immunization along with an educational handout.  An After Visit Summary was printed and given to the patient. Allergies as of 06/30/2018   No Known Allergies     Medication List        Accurate as of 06/30/18 12:55 PM. Always use your most recent med list.          apixaban 5 MG Tabs tablet Commonly known as:  ELIQUIS Take 1 tablet (5 mg total) by mouth 2 (two) times daily.   carbidopa-levodopa 25-100 MG tablet Commonly known as:  SINEMET IR Take 1.5 tablets by mouth 4 (four) times daily. Take 1 1/2 pills 4 times a day, at 8 am, 12 pm and 4 pm and 8 PM. 90 day Rx please   cetirizine 10 MG tablet Commonly known  as:  ZYRTEC Take 1 tablet (10 mg total) by mouth daily.   latanoprost 0.005 % ophthalmic solution Commonly known as:  XALATAN Place 1 drop into both eyes at bedtime.   multivitamin tablet Take 1 tablet by mouth daily.   propranolol ER 60 MG 24 hr capsule Commonly known as:  INDERAL LA Take 1 capsule (60 mg total) by mouth daily.   tiZANidine 4 MG tablet Commonly known as:  ZANAFLEX Take 1 tablet (4 mg total) by mouth every 8 (eight) hours as needed for muscle spasms.      Return in about 4 months (around 10/30/2018) for fasting labs; 4 months.

## 2018-07-09 DIAGNOSIS — K4030 Unilateral inguinal hernia, with obstruction, without gangrene, not specified as recurrent: Secondary | ICD-10-CM | POA: Diagnosis not present

## 2018-07-31 MED FILL — CARBIDOPA-LEVODOPA 25-100 T: 25-100 | 30 days supply | Qty: 180 | Fill #5

## 2018-07-31 MED FILL — PROPRANOLOL ER 60 MG CAP: 60 | 30 days supply | Qty: 30 | Fill #6

## 2018-08-20 MED FILL — $ELIQUIS 5 MG TABLET: 5 | 90 days supply | Qty: 180 | Fill #5

## 2018-08-27 ENCOUNTER — Encounter: Payer: Self-pay | Admitting: Neurology

## 2018-08-27 ENCOUNTER — Ambulatory Visit (INDEPENDENT_AMBULATORY_CARE_PROVIDER_SITE_OTHER): Payer: Medicare Other | Admitting: Neurology

## 2018-08-27 VITALS — BP 122/69 | HR 78 | Ht 68.0 in | Wt 172.0 lb

## 2018-08-27 DIAGNOSIS — G20 Parkinson's disease: Secondary | ICD-10-CM | POA: Diagnosis not present

## 2018-08-27 NOTE — Patient Instructions (Addendum)
Please stay active mentally and physically.  Please keep your appointment with the heart doctor.  Please stay well hydrated with water.  We will keep your Sinemet the same.  Please follow through with the orthopedic referral for L sided back pain.

## 2018-08-27 NOTE — Progress Notes (Signed)
Subjective:    Sean Ward ID: Sean Ward is a 78 y.o. male.  HPI     Interim history:   Sean Ward is a 77 year old right-handed gentleman with an underlying medical history of bronchitis, PAF and osteoarthritis, who presents for follow-up consultation of his parkinsonism. The Sean Ward is unaccompanied today. I last saw Sean Ward on 02/24/2018, at which time Sean Ward reported doing okay, thankfully no falls. Sean Ward reported occasional dizziness and lightheadedness. His blood pressure was trending lower. Sean Ward was tolerating the Eliquis, has had lower extremity swelling, also right knee pain. Sean Ward was advised by cardiology to use compression socks, but was not wearing them regularly. Sean Ward was planning a trip to [place]. Sean Ward was advised to continue with Sinemet 1-1/2 pills 4 times a day.  Today, 08/27/2018: Sean Ward reports Sean Ward went to [place], [place]. Planning to spend Christmas in [place] where Sean Ward has family. Sean Ward has had more tremor in the morning on the left side. Sean Ward admits that Sean Ward keeps his house very cold, does not like to use the heat in the house and it has been in the 40s in the house in the mornings. Sean Ward turns on the heat briefly in the evening and turns it off before going to sleep, also, Sean Ward does not like the air conditioner on during the summertime. Sean Ward has not had any falls, Sean Ward believes that the medication is still helpful. Sean Ward was supposed to see orthopedics for left-sided low back pain with radiation to the left thigh.   The Sean Ward's allergies, current medications, family history, past medical history, past social history, past surgical history and problem list were reviewed and updated as appropriate.    Previously (copied from previous notes for reference):  (Sean Ward) had to miss an appointment on 02/13/2018. I saw Sean Ward on 08/15/2017, at which time Sean Ward had no acute issues but overall was not as active. His balance was not as good. Sean Ward felt that his legs are heavier. Sean Ward was taking Sinemet one and half pill 3 times a day, I  suggested we increase it to 4 times a day. His heart rate and cardiac auscultation suggested significant irregularities and I suggested Sean Ward follow-up with cardiology.    Of note, Sean Ward missed an appointment on 08/14/2017. I saw Sean Ward on 02/12/2017, at which time Sean Ward reported doing okay, had a recent trip to [place]. Sean Ward was trying to stay active, felt like his right leg was weaker than the left. 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 Sean Ward had seen a chiropractor for this about 3 years prior. Sean Ward was taking melatonin first jet lag. Sean Ward was trying hydrate well. Sean Ward felt that the Sinemet was working well for Sean Ward, reported no side effects, was taking 1-1/2 pills 3 times a day. I suggested a six-month follow-up.   I saw Sean Ward on 07/24/2016, at which time Sean Ward reported tolerating the Sinemet and Sean Ward felt that the tremor was less. Sean Ward planned a trip to [place] for 6 days. Sean Ward had no recent falls. Sean Ward was not exercising on a regular basis. Sean Ward reported a stable mood and memory. We talked about healthy lifestyle in the importance of exercise. I suggested Sean Ward increase his Sinemet to 1-1/2 pills 3 times a day.     I first met Sean Ward on 06/06/2016 at the request of his primary care physician, at which time Sean Ward reported an upper extremity tremor on the left frontal past 6-12 months and more recently in the right upper extremity.  On examination, Sean Ward had signs and keeping with parkinsonism, with left-sided predominance. I suggested we start Sean Ward on Sinemet 25-100 milligrams strength half a pill twice a day with gradual titration to 1 pill 3 times a day. I ordered a brain MRI wo contrast. Sean Ward had this on 06/18/16: IMPRESSION:  This MRI of the brain without contrast shows the following: 1.    Scattered T2/FLAIR hyperintense foci in the subcortical, deep and periventricular white matter in a pattern most consistent with mild chronic microvascular ischemic change.   None of the foci appears to be  acute. 2.    There are no acute findings.   We called Sean Ward with the results.    06/06/2016: Sean Ward has had an upper extremity tremor for the past 6-12 months, unclear exactly how long and tremor started on the L, now also on the R side about a month ago. Leg tremors started about 2-3 months ago. 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 I reviewed your office note from 05/24/2016. You discontinued metoprolol and started Sean Ward on propranolol long-acting 60 mg once daily. Sean Ward is physically active, but not so much lately. Sean Ward plays tennis and rides a bike, but not able to lately.  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  Sean Ward drinks wine with dinner, 1-2 glasses. Tremor is less post alcohol. Sean Ward drinks coffee about 3 cups per day, otherwise water, about 8 glasses.  Sean Ward has swelling of both LEs, for the past 6+ months, was on Lasix, but no longer. Sean Ward has been on a baby ASA.  Sean Ward lives alone in an apartment and has to navigate the one flight of stairs. [place] once about 3 months ago, in his apartment onto his L side, no head injury, no LOC, sleeps more during the day in the afternoon, which helps. Sean Ward is separated (going through divorce), has one daughter and one son, both live in [place], [place].  Sean Ward is a non-smoker, no FHx of tremors, or PD. Sean Ward feels like Sean Ward has a tendency to fall backwards. Sean Ward has only fallen once in the past year, onto his left side, thankfully no injury as mentioned. Memory is fairly good, Sean Ward denies any mood related issues, Sean Ward does worry about his tremor. Sean Ward is planning an overseas trip by the end of September.  His Past Medical History Is Significant For: Past Medical History:  Diagnosis Date  . Anticoagulant long-term use   . Back pain with radiation   . History of bronchitis   . Parkinson's disease ([place])   . Persistent atrial fibrillation   . Scoliosis of cervicothoracic spine   . Scoliosis of thoracolumbar spine   . Spondylosis of lumbar spine     His Past Surgical  History Is Significant For: Past Surgical History:  Procedure Laterality Date  . HERNIA REPAIR    . NOne    . TONSILLECTOMY      His Family History Is Significant For: Family History  Problem Relation Ward of Onset  . Deafness Mother   . Deafness Sister   . Heart disease Neg Hx     His Social History Is Significant For: Social History   Socioeconomic History  . Marital status: Legally Separated    Spouse name: Not on file  . Number of children: 2  . Years of education: BA  . Highest education level: Not on file  Occupational History  . Not on file  Social Needs  . Financial resource strain: Not on  file  . Food insecurity:    Worry: Not on file    Inability: Not on file  . Transportation needs:    Medical: Not on file    Non-medical: Not on file  Tobacco Use  . Smoking status: Never Smoker  . Smokeless tobacco: Never Used  Substance and Sexual Activity  . Alcohol use: Yes    Alcohol/week: 7.0 standard drinks    Types: 7 Glasses of wine per week    Comment: 1 glass of red wine with dinner  . Drug use: No  . Sexual activity: Never  Lifestyle  . Physical activity:    Days per week: Not on file    Minutes per session: Not on file  . Stress: Not on file  Relationships  . Social connections:    Talks on phone: Not on file    Gets together: Not on file    Attends religious service: Not on file    Active member of club or organization: Not on file    Attends meetings of clubs or organizations: Not on file    Relationship status: Not on file  Other Topics Concern  . Not on file  Social History Narrative   Drinks 2 cups of coffee a day     His Allergies Are:  No Known Allergies:   His Current Medications Are:  Outpatient Encounter Medications as of 08/27/2018  Medication Sig  . apixaban (ELIQUIS) 5 MG TABS tablet Take 1 tablet (5 mg total) by mouth 2 (two) times daily.  . carbidopa-levodopa (SINEMET IR) 25-100 MG tablet Take 1.5 tablets by mouth 4 (four)  times daily. Take 1 1/2 pills 4 times a day, at 8 am, 12 pm and 4 pm and 8 PM. 90 day Rx please  . cetirizine (ZYRTEC) 10 MG tablet Take 1 tablet (10 mg total) by mouth daily.  Marland Kitchen latanoprost (XALATAN) 0.005 % ophthalmic solution Place 1 drop into both eyes at bedtime.  . Multiple Vitamin (MULTIVITAMIN) tablet Take 1 tablet by mouth daily.  . propranolol ER (INDERAL LA) 60 MG 24 hr capsule Take 1 capsule (60 mg total) by mouth daily.  . [DISCONTINUED] tiZANidine (ZANAFLEX) 4 MG tablet Take 1 tablet (4 mg total) by mouth every 8 (eight) hours as needed for muscle spasms. (Sean Ward not taking: Reported on 06/30/2018)   No facility-administered encounter medications on file as of 08/27/2018.   :  Review of Systems:  Out of a complete 14 point review of systems, all are reviewed and negative with the exception of these symptoms as listed below:  Review of Systems  Neurological:       Pt presents today to discuss his PD. Pt reports that Sean Ward is doing well and has not had any recent falls.    Objective:  Neurological Exam  Physical Exam Physical Examination:   Vitals:   08/27/18 1117  BP: 122/69  Pulse: 78    General Examination: The Sean Ward is a very pleasant 77 y.o. male in no acute distress. Sean Ward appears well-developed and well-nourished and well groomed.   HEENT:Normocephalic, atraumatic, pupils are equal, round and reactive to light and accommodation. Extraocular tracking Shows mild saccadic breakdown. Sean Ward has mild facial masking, very slight left ptosis. Neck rigidity is mild. Sean Ward has an intermittent lower lip and jaw tremor. Sean Ward has no drooling. Hearing is grossly intact.   Chest:Clear to auscultation without wheezing, rhonchi or crackles noted.  Heart:S1+S2+0,irregularly irreg.   Abdomen:Soft, non-tender and non-distended with normal bowel sounds appreciated  on auscultation.  Extremities:There is trace to 1+edema in the distal lower extremities, left more than  right.  Skin: Warm and dry without trophic changes noted.  Musculoskeletal: exam reveals no obvious joint deformities, tenderness or joint swelling or erythema, with the exception of tenderness in the right knee with mild swelling noted.   Neurologically: Mental status: The Sean Ward is awake, alert and oriented in all 4 spheres. Hisimmediate and remote memory, attention, language skills and fund of knowledge are appropriate. There is no evidence of aphasia, agnosia, apraxia or anomia. Speech is mildly hypophonic, no dysarthria. Thought process is linear. Mood is normaland affect is normal.  Cranial nerves II - XII are as described above under HEENT exam.  Motor exam: Normal bulk, and strength for Ward, tone is mildly increased. Sean Ward has a very mild resting tremor on the left upper extremity and an intermittent slight resting tremor in the right upper extremity, mild intermittent resting tremor in the leftand rightlower extremity, stable. Reflexes are about 1+ throughout. Fine motor skills are mild to moderately impaired on the left and mildly so on the right. Sean Ward stands with mild difficulty, posture is mildly stooped, Sean Ward does have some evidence of scoliosis, right shoulder is lower than left, seems worse. Sean Ward walks with decreased stride length and pace, decreased arm swing bilaterally, left more than right. Tandem walk is not safe to test. Balance is overall mildly impaired.   Assessment and Plan:  In summary, Kaeden Mester a very pleasant 77 year old male with an underlying medical history of bronchitis, PAF (followed by cardiology), osteoarthritis, and mildly overweight state, who presents for follow-up consultation of his parkinsonism with left-sided predominant findings, in keeping with mild Parkinson's disease. Sean Ward has had progression with time. Sean Ward is currently on Sinemet 1-1/2 pills 4 times a day. His swelling has actually improved. Sean Ward was started on Eliquis earlier this year. 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 Sean Ward is advised to stay well-hydrated and change positions slowly. Of note, Sean Ward had a brain MRI without contrast on 8/28/17which showed chronic white matter changes, no acute changes, no significant atrophy for Ward. I suggested we continue with his Sinemet generic 1-1/2 pills 4 times a day, Sean Ward did not need a refill today. I suggested a 6 month follow-up, sooner as needed. I answered all his questions today and Sean Ward was in agreement. I spent 25 minutes in total face-to-face time with the Sean Ward, more than 50% of which was spent in counseling and coordination of care, reviewing test results, reviewing medication and discussing or reviewing the diagnosis of PD, its prognosis and treatment options. Pertinent laboratory and imaging test results that were available during this visit with the Sean Ward were reviewed by me and considered in my medical decision making (see chart for details).

## 2018-09-02 MED FILL — PROPRANOLOL ER 60 MG CAP: 60 | 30 days supply | Qty: 30 | Fill #7

## 2018-09-02 MED FILL — CARBIDOPA-LEVODOPA 25-100 T: 25-100 | 30 days supply | Qty: 180 | Fill #6

## 2018-10-01 MED FILL — CARBIDOPA-LEVODOPA 25-100 T: 25-100 | 30 days supply | Qty: 180 | Fill #7

## 2018-10-01 MED FILL — PROPRANOLOL ER 60 MG CAP: 60 | 30 days supply | Qty: 30 | Fill #8

## 2018-10-30 ENCOUNTER — Ambulatory Visit: Payer: Medicare Other | Admitting: Family Medicine

## 2018-10-30 MED FILL — PROPRANOLOL ER 60 MG CAP: 60 | 30 days supply | Qty: 30 | Fill #9

## 2018-11-03 MED FILL — CARBIDOPA-LEVODOPA 25-100 T: 25-100 | 30 days supply | Qty: 180 | Fill #8

## 2018-11-05 ENCOUNTER — Other Ambulatory Visit: Payer: Self-pay | Admitting: Interventional Cardiology

## 2018-11-14 ENCOUNTER — Ambulatory Visit: Payer: Medicare Other | Admitting: Family Medicine

## 2018-11-19 NOTE — Progress Notes (Signed)
Cardiology Office Note   Date:  11/20/2018   ID:  Sean Ward, DOB 11/24/1940, MRN [id number]  PCP:  Antony Blackbird, MD    No chief complaint on file.  Atrial fibrillation  Wt Readings from Last 3 Encounters:  11/20/18 180 lb 12.8 oz (82 kg)  08/27/18 172 lb (78 kg)  06/30/18 172 lb (78 kg)       History of Present Illness: Sean Ward is a 78 y.o. male  who was diagnosed with atrial fibrillation in January 2016. [facility]. He was asymptomatic when he initially was diagnosed with a heart rate of 115 220 bpm. His chads score was only 1 and therefore he was not started on anticoagulation. He was started on full dose aspirin. He was treated with Lasix for edema, but this did not seem to help the edema which was thought to be due to venous stasis and varicose veins.  Compression stockings have helped.  He has Parkinsons sx. He is benefitting from the medication.  At the last visit, aspirin was stopped and Eliquis was started.   He has been walking some.  He feels some fatigue when he walks.  He was in [place] a few months ago.  He was trying to do some hiking there.    Denies : Chest pain. Dizziness. Leg edema. Nitroglycerin use. Orthopnea. Palpitations. Paroxysmal nocturnal dyspnea. Shortness of breath. Syncope.   No bleeding problems.  Still taking Eliquis.   Parkinson's sx have been controlled.   Mild left leg edema, chronic.  He has had some pain in the left leg.  It may be from his back.      Past Medical History:  Diagnosis Date  . Anticoagulant long-term use   . Back pain with radiation   . History of bronchitis   . Parkinson's disease ([place])   . Persistent atrial fibrillation   . Scoliosis of cervicothoracic spine   . Scoliosis of thoracolumbar spine   . Spondylosis of lumbar spine     Past Surgical History:  Procedure Laterality Date  . HERNIA REPAIR    . NOne    . TONSILLECTOMY        Current Outpatient Medications  Medication Sig Dispense Refill  . apixaban (ELIQUIS) 5 MG TABS tablet TAKE 1 TABLET (5 MG TOTAL) BY MOUTH 2 (TWO) TIMES DAILY. 60 tablet 5  . carbidopa-levodopa (SINEMET IR) 25-100 MG tablet Take 1.5 tablets by mouth 4 (four) times daily. Take 1 1/2 pills 4 times a day, at 8 am, 12 pm and 4 pm and 8 PM. 90 day Rx please 540 tablet 3  . cetirizine (ZYRTEC) 10 MG tablet Take 1 tablet (10 mg total) by mouth daily. 30 tablet 1  . latanoprost (XALATAN) 0.005 % ophthalmic solution Place 1 drop into both eyes at bedtime.    . Multiple Vitamin (MULTIVITAMIN) tablet Take 1 tablet by mouth daily.    . propranolol ER (INDERAL LA) 60 MG 24 hr capsule Take 1 capsule (60 mg total) by mouth daily. 30 capsule 11   No current facility-administered medications for this visit.     Allergies:   Patient has no known allergies.    Social History:  The patient  reports that he has never smoked. He has never used smokeless tobacco. He reports current alcohol use of about 7.0 standard drinks of alcohol per week. He reports that he does not use drugs.   Family  History:  The patient's family history includes Deafness in his mother and sister.    ROS:  Please see the history of present illness.   Otherwise, review of systems are positive for leg pain.   All other systems are reviewed and negative.    PHYSICAL EXAM: VS:  BP 116/82   Pulse 72   Ht 5' 8" (1.727 m)   Wt 180 lb 12.8 oz (82 kg)   SpO2 99%   BMI 27.49 kg/m  , BMI Body mass index is 27.49 kg/m. GEN: Well nourished, well developed, in no acute distress  HEENT: normal  Neck: no JVD, carotid bruits, or masses Cardiac: ;Irregularly irregular no murmurs, rubs, or gallops,; compression stockings in place, mild bilateral leg edema  Respiratory:  clear to auscultation bilaterally, normal work of breathing GI: soft, nontender, nondistended, + BS MS: no deformity or atrophy  Skin: warm and dry, no rash Neuro:   Strength and sensation are intact Psych: euthymic mood, full affect   EKG:   The ekg ordered today demonstrates AFib, rate controlled   Recent Labs: 01/06/2018: BUN 21; Creatinine, Ser 1.21; Hemoglobin 13.0; Platelets 278; Potassium 4.6; Sodium 142   Lipid Panel    Component Value Date/Time   CHOL 191 08/21/2017 1503   TRIG 115 08/21/2017 1503   HDL 50 08/21/2017 1503   CHOLHDL 3.8 08/21/2017 1503   CHOLHDL 3.5 08/16/2016 1038   VLDL 24 08/16/2016 1038   LDLCALC 118 (H) 08/21/2017 1503     Other studies Reviewed: Additional studies/ records that were reviewed today with results demonstrating: 2018 labs reviewed.   ASSESSMENT AND PLAN:  1. PAF: Rate controlled atrial fibrillation today.  Continue propranolol.  Eliquis for stroke prevention.  No bleeding problems. 2. LE edema: Chronic.  Continue to use compression stockings.  Elevate legs as well. 3. Fall risk: Parkinson's under control.  No recent falls. 4. Will check CBC, C met, lipids when he is fasting.   Current medicines are reviewed at length with the patient today.  The patient concerns regarding his medicines were addressed.  The following changes have been made:  No change  Labs/ tests ordered today include: As above  Orders Placed This Encounter  Procedures  . CBC  . Comprehensive metabolic panel  . Lipid panel  . EKG 12-Lead    Recommend 150 minutes/week of aerobic exercise Low fat, low carb, high fiber diet recommended  Disposition:   FU in 1 year   Signed, Larae Grooms, MD  11/20/2018 11:20 AM    [facility] [address], [place], [place]  [address] Phone: [phone]; Fax: [phone]

## 2018-11-20 ENCOUNTER — Encounter: Payer: Self-pay | Admitting: Interventional Cardiology

## 2018-11-20 ENCOUNTER — Ambulatory Visit (INDEPENDENT_AMBULATORY_CARE_PROVIDER_SITE_OTHER): Payer: Medicare Other | Admitting: Interventional Cardiology

## 2018-11-20 VITALS — BP 116/82 | HR 72 | Ht 68.0 in | Wt 180.8 lb

## 2018-11-20 DIAGNOSIS — I4819 Other persistent atrial fibrillation: Secondary | ICD-10-CM

## 2018-11-20 DIAGNOSIS — Z9181 History of falling: Secondary | ICD-10-CM

## 2018-11-20 DIAGNOSIS — R600 Localized edema: Secondary | ICD-10-CM

## 2018-11-20 MED ORDER — APIXABAN 5 MG PO TABS: ORAL_TABLET | ORAL | 5 refills | Status: DC

## 2018-11-20 MED FILL — ELIQUIS 5 MG TABLET: 5 | 30 days supply | Qty: 60 | Fill #0

## 2018-11-20 NOTE — Patient Instructions (Addendum)
Medication Instructions:  Your physician recommends that you continue on your current medications as directed. Please refer to the Current Medication list given to you today.  If you need a refill on your cardiac medications before your next appointment, please call your pharmacy.   Lab work: Your physician recommends that you return for a FASTING lipid profile, complete metabolic panel, and complete blood count  If you have labs (blood work) drawn today and your tests are completely normal, you will receive your results only by: Marland Kitchen MyChart Message (if you have MyChart) OR . A paper copy in the mail If you have any lab test that is abnormal or we need to change your treatment, we will call you to review the results.  Testing/Procedures: None ordered  Follow-Up: At [facility], you and your health needs are our priority.  As part of our continuing mission to provide you with exceptional heart care, we have created designated Provider Care Teams.  These Care Teams include your primary Cardiologist (physician) and Advanced Practice Providers (APPs -  Physician Assistants and Nurse Practitioners) who all work together to provide you with the care you need, when you need it. . You will need a follow up appointment in 1 year.  Please call our office 2 months in advance to schedule this appointment.  You may see Everette Rank, MD or one of the following Advanced Practice Providers on your designated Care Team:   . Robbie Lis, PA-C . Dayna Dunn, PA-C . Jacolyn Reedy, PA-C  Any Other Special Instructions Will Be Listed Below (If Applicable).

## 2018-11-21 ENCOUNTER — Telehealth: Payer: Self-pay

## 2018-11-21 NOTE — Telephone Encounter (Signed)
I have done an Eliquis PA through covermymeds. Key: [id number]

## 2018-11-25 NOTE — Telephone Encounter (Signed)
**Note De-Identified Sean Ward Obfuscation** Letter received Sean Ward fax from [employer] stating that Eliquis is denied as a benefit exclusion. Your medicare Advantage ([place]) plan does not cover outpatient prescription drugs.  I have left a detailed message on the pts VM asking him to call us back concerning this denial.  I have also notified the pts pharmacy.

## 2018-11-25 NOTE — Telephone Encounter (Signed)
Ok for him to start warfarin if he cannot afford Eliquis. GT

## 2018-11-26 NOTE — Telephone Encounter (Signed)
[place] Pt  Outreach made to Pt.  Left detailed message.  Advised Pt that office did not have an insurance card on file for medications.  Asked Pt to call office with that information if he had medication insurance.  At this time his Eliquis has been denied.  Will forward to ordering physician for further management.

## 2018-11-27 MED FILL — PROPRANOLOL ER 60 MG CAP: 60 | 30 days supply | Qty: 30 | Fill #10

## 2018-11-27 NOTE — Telephone Encounter (Signed)
I agree with Dr. Ladona Ridgel.

## 2018-11-28 NOTE — Telephone Encounter (Signed)
Left message for patient to call back  

## 2018-12-01 ENCOUNTER — Ambulatory Visit: Payer: Medicare Other | Admitting: Family Medicine

## 2018-12-01 MED FILL — CARBIDOPA-LEVODOPA 25-100 T: 25-100 | 30 days supply | Qty: 180 | Fill #9

## 2018-12-05 NOTE — Telephone Encounter (Signed)
Left message for patient to call back  

## 2018-12-09 NOTE — Telephone Encounter (Signed)
Attempted to contact patient again, but no answer. Letter sent in the mail for patient to contact office.

## 2018-12-19 ENCOUNTER — Encounter: Payer: Self-pay | Admitting: Family Medicine

## 2018-12-19 ENCOUNTER — Ambulatory Visit: Payer: Medicare Other | Attending: Family Medicine | Admitting: Family Medicine

## 2018-12-19 VITALS — BP 107/70 | HR 88 | Temp 98.6°F | Resp 18 | Ht 66.0 in | Wt 181.0 lb

## 2018-12-19 DIAGNOSIS — G20A1 Parkinson's disease without dyskinesia, without mention of fluctuations: Secondary | ICD-10-CM

## 2018-12-19 DIAGNOSIS — G20 Parkinson's disease: Secondary | ICD-10-CM

## 2018-12-19 DIAGNOSIS — M545 Low back pain, unspecified: Secondary | ICD-10-CM

## 2018-12-19 DIAGNOSIS — I480 Paroxysmal atrial fibrillation: Secondary | ICD-10-CM

## 2018-12-19 DIAGNOSIS — Z7901 Long term (current) use of anticoagulants: Secondary | ICD-10-CM

## 2018-12-19 DIAGNOSIS — Z79899 Other long term (current) drug therapy: Secondary | ICD-10-CM

## 2018-12-19 DIAGNOSIS — M5440 Lumbago with sciatica, unspecified side: Secondary | ICD-10-CM

## 2018-12-19 DIAGNOSIS — R600 Localized edema: Secondary | ICD-10-CM

## 2018-12-19 DIAGNOSIS — R2681 Unsteadiness on feet: Secondary | ICD-10-CM

## 2018-12-19 DIAGNOSIS — E785 Hyperlipidemia, unspecified: Secondary | ICD-10-CM

## 2018-12-19 MED FILL — !ELIQUIS 5MG TABLET: 5 | 30 days supply | Qty: 60 | Fill #1

## 2018-12-19 NOTE — Progress Notes (Signed)
Subjective:    Patient ID: Sean Ward, male    DOB: 11/03/1940, 78 y.o.   MRN: [id number]  HPI       78 year old male seen in follow-up of Parkinson's disease with unsteady gait.  Patient reports no falls in the past 12 months.  Patient thinks that he is doing well on his current medication.  Patient states that he will follow-up with neurology sometime later this year.  Patient saw his cardiologist in January however patient did not return for his fasting lab work.  Patient is nonfasting at today's visit as he had lunch about 2 hours ago.  Patient has no sensation of chest pain or palpitations.  Patient is taking his Eliquis daily and denies any unusual bruising or bleeding.  Patient denies any dizziness.  Patient denies shortness of breath.  Patient does get increased swelling in his lower legs if he has been sitting for a while.  Edema improves with walking.  Patient also wears compression hose sometimes.      Patient reports continued low back pain on the left with radiation down the left leg.  Pain is sharp in the low back and is about a 8-9 on a 0-to-10 scale.  Patient tries not to take medication for the pain but when he does take over-the-counter Tylenol 500 mg the pain decreases to about a 7.  Patient missed his orthopedic appointment to which he was referred at his last visit.  Patient states that otherwise he feels well.  Past Medical History:  Diagnosis Date  . Anticoagulant long-term use   . Back pain with radiation   . History of bronchitis   . Parkinson's disease (HCC)   . Persistent atrial fibrillation   . Scoliosis of cervicothoracic spine   . Scoliosis of thoracolumbar spine   . Spondylosis of lumbar spine    Past Surgical History:  Procedure Laterality Date  . HERNIA REPAIR    . TONSILLECTOMY     Family History  Problem Relation Age of Onset  . Deafness Mother   . Deafness Sister   . Heart disease Neg Hx    Social History   Tobacco Use  . Smoking status:  Never Smoker  . Smokeless tobacco: Never Used  Substance Use Topics  . Alcohol use: Yes    Alcohol/week: 7.0 standard drinks    Types: 7 Glasses of wine per week    Comment: 1 glass of red wine with dinner  . Drug use: No  No Known Allergies  Current Outpatient Medications on File Prior to Visit  Medication Sig Dispense Refill  . apixaban (ELIQUIS) 5 MG TABS tablet TAKE 1 TABLET (5 MG TOTAL) BY MOUTH 2 (TWO) TIMES DAILY. 60 tablet 5  . carbidopa-levodopa (SINEMET IR) 25-100 MG tablet Take 1.5 tablets by mouth 4 (four) times daily. Take 1 1/2 pills 4 times a day, at 8 am, 12 pm and 4 pm and 8 PM. 90 day Rx please 540 tablet 3  . cetirizine (ZYRTEC) 10 MG tablet Take 1 tablet (10 mg total) by mouth daily. 30 tablet 1  . latanoprost (XALATAN) 0.005 % ophthalmic solution Place 1 drop into both eyes at bedtime.    . Multiple Vitamin (MULTIVITAMIN) tablet Take 1 tablet by mouth daily.    . propranolol ER (INDERAL LA) 60 MG 24 hr capsule Take 1 capsule (60 mg total) by mouth daily. 30 capsule 11   No current facility-administered medications on file prior to visit.  Review of Systems  Constitutional: Negative for chills, fatigue and fever.  HENT: Negative for nosebleeds and trouble swallowing.   Respiratory: Negative for cough and shortness of breath.   Cardiovascular: Positive for leg swelling. Negative for chest pain and palpitations.  Gastrointestinal: Negative for abdominal pain, blood in stool, constipation, diarrhea and nausea.  Endocrine: Negative for polydipsia, polyphagia and polyuria.  Genitourinary: Negative for dysuria, frequency and hematuria.  Musculoskeletal: Positive for back pain and gait problem.  Neurological: Positive for tremors (greatly improved on medication). Negative for dizziness and headaches.  Hematological: Negative for adenopathy. Does not bruise/bleed easily.       Objective:   Physical Exam BP 107/70 (BP Location: Left Arm, Patient Position: Sitting,  Cuff Size: Normal)   Pulse 88   Temp 98.6 F (37 C) (Oral)   Resp 18   Ht 5\' 6"  (1.676 m)   Wt 181 lb (82.1 kg)   SpO2 93%   BMI 29.21 kg/m  Nurse's notes and vital signs reviewed  General-well-nourished, well-developed older male in no acute distress.  Patient with a slow shuffling gait when getting up from the chair to get onto exam table Neck-supple, no lymphadenopathy, no thyromegaly, no carotid bruit Lungs-clear to auscultation bilaterally Cardiovascular-regular rate and rhythm Abdomen-soft, nontender Back-no CVA tenderness.  Patient with left SI joint tenderness, mild bilateral lumbosacral paraspinous spasm and patient with positive seated left leg raise Extremities-patient with mild bilateral nonpitting distal lower extremity edema Psych-normal mood and judgment      Assessment & Plan:  1. Parkinson disease [facility]) Patient reports that he is doing well with his current treatment for Parkinson's disease.  Patient has had no significant issues with the tremor and patient has had no falls in the last 12 months.  Patient will keep yearly follow-up with neurology and as needed.  Patient will return for CMP in follow-up of medication use - Comprehensive metabolic panel; Future  2. Paroxysmal atrial fibrillation [facility]) Patient has had recent follow-up with his cardiologist.  Patient currently with rate controlled paroxysmal atrial fibrillation for which he is also taking Eliquis for prevention of blood clots within the heart/reduction of stroke risk.  Patient will have CBC in follow-up of use of anticoagulant medication - CBC with Differential; Future  3. Dyslipidemia Patient with dyslipidemia and was to have lipid panel done after recent cardiology visit but patient missed lab appointment.  Patient agrees to return for fasting lab appointment in the next 1 to 2 weeks - Lipid Panel; Future  4. 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   Patient missed his appointment with orthopedics therefore new referral will be placed.  Patient is encouraged to take Tylenol as needed to reduce the pain.  Patient is aware that he needs to avoid nonsteroidal anti-inflammatory use due to his use of Eliquis for anticoagulation - AMB referral to orthopedics  5. Encounter for long-term (current) use of medications Patient will return for CMP in follow-up of long-term use of medications - Comprehensive metabolic panel; Future  6. Long term current use of anticoagulant Patient reports compliance with Eliquis and denies any unusual bruising or bleeding.  Patient will have CBC at upcoming follow-up visit to make sure that hemoglobin and platelet counts are stable - CBC with Differential; Future  7. Unsteady gait Patient reports improvement in balance and gait with the use of medication for treatment of Parkinson's and he has had no falls within the past year  8.  Bilateral lower extremity edema Patient with chronic issues with lower extremity edema which is thought to be secondary to vascular insufficiency and patient should continue the use of compression hose as well as elevation of legs as needed when edema occurs  An After Visit Summary was printed and given to the patient.  Return in about 5 months (around 05/19/2019) for chronic issues; 1-2 weeks for fasting labs.

## 2018-12-26 ENCOUNTER — Other Ambulatory Visit: Payer: Medicare Other

## 2018-12-29 ENCOUNTER — Encounter: Payer: Self-pay | Admitting: *Deleted

## 2018-12-30 ENCOUNTER — Telehealth: Payer: Self-pay | Admitting: Interventional Cardiology

## 2018-12-30 MED FILL — PROPRANOLOL ER 60 MG CAP: 60 | 30 days supply | Qty: 30 | Fill #11

## 2018-12-30 NOTE — Telephone Encounter (Signed)
Follow Up:    Please call, concerning a letter he received from you.

## 2018-12-30 NOTE — Telephone Encounter (Signed)
Called and spoke to patient. A letter was sent out due to multiple attempts to reach patient in the past with no response (see telephone encounter 1/31). Made patient aware that at this time his Eliquis has been denied and if he cannot afford his Eliquis that we need to look at switching to coumadin. Patient states that he picked up his Rx for Eliquis last week and was not charged. Patient states that he would like to continue taking Eliquis at this time.Patient states that he will let us know if he needs any further assistance. Insurance on file is up to date.

## 2019-01-01 MED FILL — CARBIDOPA-LEVODOPA 25-100 T: 25-100 | 30 days supply | Qty: 180 | Fill #10

## 2019-01-06 ENCOUNTER — Telehealth: Payer: Self-pay | Admitting: Neurology

## 2019-01-06 NOTE — Telephone Encounter (Signed)
I called pt and had an extended phone call with pt to discuss the recommendations regarding COVID-19 care and precautions. Pt denies any symptoms. Pt will call his PCP for symptoms.

## 2019-01-06 NOTE — Telephone Encounter (Signed)
Pt is wanting to know if he should be out running errands? Pt said he does not have anyone that can do this for the next couple of days. Pt states he does have a mask he could wear. Pt then said he was not going out. He is wanting to know what effect would the covid-19 have with parkinsons. Please call to advise

## 2019-01-06 NOTE — Telephone Encounter (Signed)
Please call patient and if possible forward trough my chart the following info on Covid-19, see below.  Unfortunately, we do not have enough information regarding this virus and how it would affect Parkinson's patients. Since he is in an older age group he may be at higher risk. If he has any symptoms of fever, cough, or shortness of breath, he should get in touch with his primary care physician immediately.    Coronavirus (COVID-19) Are you at risk?  Are you at risk for the Coronavirus (COVID-19)?  To be considered HIGH RISK for Coronavirus (COVID-19), you have to meet the following criteria:  . Traveled to [place], [place], [place], [place] or [place]; or in the [place] to [place], [place], [place], or [place]; and have fever, cough, and shortness of breath within the last 2 weeks of travel OR . Been in close contact with a person diagnosed with COVID-19 within the last 2 weeks and have fever, cough, and shortness of breath . IF YOU DO NOT MEET THESE CRITERIA, YOU ARE CONSIDERED LOW RISK FOR COVID-19.  What to do if you are HIGH RISK for COVID-19?  Marland Kitchen If you are having a medical emergency, call 911. . Seek medical care right away. Before you go to a doctor's office, urgent care or emergency department, call ahead and tell them about your recent travel, contact with someone diagnosed with COVID-19, and your symptoms. You should receive instructions from your physician's office regarding next steps of care.  . When you arrive at healthcare provider, tell the healthcare staff immediately you have returned from visiting [place], [place], [place], [place] or [place]; or traveled in the [place] to [place], [place], [place], or [place]; in the last two weeks or you have been in close contact with a person diagnosed with COVID-19 in the last 2 weeks.   . Tell the health care staff about your symptoms: fever, cough and shortness of breath. . After you have been seen by a medical  provider, you will be either: o Tested for (COVID-19) and discharged home on quarantine except to seek medical care if symptoms worsen, and asked to  - Stay home and avoid contact with others until you get your results (4-5 days)  - Avoid travel on public transportation if possible (such as bus, train, or airplane) or o Sent to the Emergency Department by EMS for evaluation, COVID-19 testing, and possible admission depending on your condition and test results.  What to do if you are LOW RISK for COVID-19?  Reduce your risk of any infection by using the same precautions used for avoiding the common cold or flu:  Marland Kitchen Wash your hands often with soap and warm water for at least 20 seconds.  If soap and water are not readily available, use an alcohol-based hand sanitizer with at least 60% alcohol.  . If coughing or sneezing, cover your mouth and nose by coughing or sneezing into the elbow areas of your shirt or coat, into a tissue or into your sleeve (not your hands). . Avoid shaking hands with others and consider head nods or verbal greetings only. . Avoid touching your eyes, nose, or mouth with unwashed hands.  . Avoid close contact with people who are sick. . Avoid places or events with large numbers of people in one location, like concerts or sporting events. . Carefully consider travel plans you have or are making. . If you are planning any travel outside or inside the  [place], visit the CDC's Travelers' Health webpage for the latest health notices. . If you have some symptoms but not all symptoms, continue to monitor at home and seek medical attention if your symptoms worsen. . If you are having a medical emergency, call 911.   ADDITIONAL HEALTHCARE OPTIONS FOR PATIENTS  [facility] / e-Visit: [email or web address]         [facility]: [phone]  [facility]: [phone]                   [facility]:  [phone]

## 2019-01-07 ENCOUNTER — Ambulatory Visit (INDEPENDENT_AMBULATORY_CARE_PROVIDER_SITE_OTHER): Payer: Medicare Other | Admitting: Family Medicine

## 2019-01-16 ENCOUNTER — Other Ambulatory Visit: Payer: Self-pay | Admitting: Interventional Cardiology

## 2019-01-16 DIAGNOSIS — R251 Tremor, unspecified: Secondary | ICD-10-CM

## 2019-01-16 MED FILL — PROPRANOLOL ER 60 MG CAP: 60 | 30 days supply | Qty: 30 | Fill #0

## 2019-01-16 MED FILL — $ELIQUIS 5 MG TABLET: 5 | 90 days supply | Qty: 180 | Fill #2

## 2019-01-20 MED FILL — CARBIDOPA-LEVODOPA 25-100 T: 25-100 | 30 days supply | Qty: 180 | Fill #11

## 2019-02-11 ENCOUNTER — Ambulatory Visit (INDEPENDENT_AMBULATORY_CARE_PROVIDER_SITE_OTHER): Payer: Self-pay | Admitting: Family Medicine

## 2019-02-19 ENCOUNTER — Telehealth: Payer: Self-pay | Admitting: Neurology

## 2019-02-19 DIAGNOSIS — G20 Parkinson's disease: Secondary | ICD-10-CM

## 2019-02-19 NOTE — Telephone Encounter (Signed)
Due to current COVID 19 pandemic, our office is severely reducing in office visits until further notice, in order to minimize the risk to our patients and healthcare providers.   Called patient to offer virtual visit for his 5/6 appointment. Patient accepted and verbalized understanding of the [email or web address] process. Patient is aware that he will receive an e-mail with Dr. Teofilo Pod link as well as directions. Patient understands that he will receive calls from RN as well as front office staff.  Pt understands that although there may be some limitations with this type of visit, we will take all precautions to reduce any security or privacy concerns.  Pt understands that this will be treated like an in office visit and we will file with pt's insurance, and there may be a patient responsible charge related to this service.

## 2019-02-24 MED ORDER — CARBIDOPA-LEVODOPA 25-100 MG PO TABS: 1.5000 | ORAL_TABLET | Freq: Four times a day (QID) | ORAL | 3 refills | Status: DC

## 2019-02-24 MED FILL — CARBIDOPA-LEVODOPA 25-100 T: 25-100 | 90 days supply | Qty: 540 | Fill #0

## 2019-02-24 NOTE — Addendum Note (Signed)
Addended by: Geronimo Running A on: 02/24/2019 02:59 PM   Modules accepted: Orders

## 2019-02-24 NOTE — Telephone Encounter (Signed)
I called pt. Pt's meds, allergies, and PMH were updated.  Pt needs a refill on his sinemet. Will send to pt's pharmacy.  Pt requests a new appt, appt moved to 03/05/19 at 11:30am.  Pt reports that he did receive the email for the [email or web address] link.

## 2019-02-24 NOTE — Telephone Encounter (Signed)
I called pt to update his chart. No answer, left a message asking him to call me back. 

## 2019-02-24 NOTE — Addendum Note (Signed)
Addended by: Geronimo Running A on: 02/24/2019 03:01 PM   Modules accepted: Orders

## 2019-02-25 ENCOUNTER — Ambulatory Visit: Payer: Medicare Other | Admitting: Neurology

## 2019-02-25 MED FILL — PROPRANOLOL ER 60 MG CAP: 60 | 30 days supply | Qty: 30 | Fill #1

## 2019-03-03 ENCOUNTER — Telehealth: Payer: Self-pay | Admitting: Family Medicine

## 2019-03-03 NOTE — Telephone Encounter (Signed)
Patient has a pain in his stomach

## 2019-03-03 NOTE — Telephone Encounter (Signed)
Can you triage patient and see if it sounds like he needs an office visit or ED? If not sure then obtain info about his abdominal pain and route back to me

## 2019-03-04 ENCOUNTER — Encounter: Payer: Self-pay | Admitting: Family Medicine

## 2019-03-04 ENCOUNTER — Ambulatory Visit: Payer: Medicare Other | Attending: Family Medicine | Admitting: Family Medicine

## 2019-03-04 ENCOUNTER — Other Ambulatory Visit: Payer: Self-pay

## 2019-03-04 DIAGNOSIS — M545 Low back pain, unspecified: Secondary | ICD-10-CM

## 2019-03-04 DIAGNOSIS — M5440 Lumbago with sciatica, unspecified side: Secondary | ICD-10-CM

## 2019-03-04 NOTE — Progress Notes (Signed)
Virtual Visit via Telephone Note  I connected with Sean Ward on 03/04/19 at  1:30 PM EDT by telephone and verified that I am speaking with the correct person using two identifiers.    I discussed the limitations, risks, security and privacy concerns of performing an evaluation and management service by telephone and the availability of in person appointments. I also discussed with the patient that there may be a patient responsible charge related to this service. The patient expressed understanding and agreed to proceed.  Patient Location: Home Provider Location: Office Others participating in call: call initiated by [place], RMA   History of Present Illness:      78 yo male Parkinson's with unsteady gait and has complaint of continued issues with chronic low back pain which radiates down his left leg.  He reports that his pain is always about a 8 on a 0-to-10 scale.  He does not like to take pain medication however and tries to deal with the pain most of the time.  He does sometimes take Tylenol with minimal relief.  He would like to have his appointment for orthopedics rescheduled.  He states that he missed his initial appointment due to concerns about COVID-19.  He would however like to have her of the appointment in June or July when hopefully there will be less risk of contracting COVID.  He denies any falls related to his back pain or Parkinson's.  He does have some fatigue as he has poor sleep related to his low back pain.  Back pain is worse after he is been still and then starts to walk.  He denies any changes in bowel or bladder function and no foot drop.  Past Medical History:  Diagnosis Date  . Anticoagulant long-term use   . Back pain with radiation   . History of bronchitis   . Parkinson's disease (HCC)   . Persistent atrial fibrillation   . Scoliosis of cervicothoracic spine   . Scoliosis of thoracolumbar spine   . Spondylosis of lumbar spine     Past  Surgical History:  Procedure Laterality Date  . HERNIA REPAIR    . TONSILLECTOMY      Family History  Problem Relation Age of Onset  . Deafness Mother   . Deafness Sister   . Heart disease Neg Hx     Social History   Tobacco Use  . Smoking status: Never Smoker  . Smokeless tobacco: Never Used  Substance Use Topics  . Alcohol use: Yes    Alcohol/week: 7.0 standard drinks    Types: 7 Glasses of wine per week    Comment: 1 glass of red wine with dinner  . Drug use: No     No Known Allergies     Observations/Objective: No vital signs or physical exam conducted as visit was done via telephone  Assessment and Plan: 1. Low back pain with radiation Due to concerns regarding COVID-19, patient would like his orthopedic appointment scheduled in 2 to 3 months from now.  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  Discussed with the patient that he can continue the use of Tylenol/acetaminophen at this time to help with back pain.  Patient was not interested in discussing other medications such as gabapentin as he states that he is already on too many medications at this time.  Patient will be contacted regarding follow-up with orthopedics.  He was also encouraged to call this week to make  an appointment in 3 months for follow-up of chronic issues as well as blood work.  Future Appointments  Date Time Provider Department Center  03/05/2019 11:30 AM Huston Foley, MD [facility] None   Follow Up Instructions:Return in about 3 months (around 06/04/2019) for chronic issues/labs.    I discussed the assessment and treatment plan with the patient. The patient was provided an opportunity to ask questions and all were answered. The patient agreed with the plan and demonstrated an understanding of the instructions.   The patient was advised to call back or seek an in-person evaluation if the symptoms worsen or if the condition fails to improve as anticipated.  I  provided 11 minutes of non-face-to-face time during this encounter.   Cain Saupe, MD

## 2019-03-04 NOTE — Progress Notes (Signed)
Lower back pain that he was suppose to see a back doctor for. Per pt then the Covid 19 came along and he never went. Per patient he still have that lower back pain. Per patient the pain radiates down his left leg

## 2019-03-04 NOTE — Telephone Encounter (Signed)
He should wear a mask and try to observe social distancing; avoid crowds/crowded areas; he may want to see if the places that he wants to go have special shopping hours for the elderly

## 2019-03-04 NOTE — Telephone Encounter (Signed)
Per pt he was just wondering due to his medical conditions and his age, is it okay for him to go out. Per pt he has been self Isolating himself for about 1 month now. Per pt he was just wondering. Per pt he is not having any issues.

## 2019-03-05 ENCOUNTER — Ambulatory Visit: Payer: Self-pay | Admitting: Neurology

## 2019-03-05 NOTE — Telephone Encounter (Signed)
Spoke with patient to inform him with what provider stated. Per patient he discussed this with provider during previous his visit. Patient verbalized understanding.

## 2019-03-12 ENCOUNTER — Other Ambulatory Visit: Payer: Self-pay

## 2019-03-12 ENCOUNTER — Ambulatory Visit: Payer: Medicare Other | Admitting: Neurology

## 2019-03-12 NOTE — Telephone Encounter (Signed)
I called pt. He was unable to join the virtual visit today. I offered him an in office visit on 03/19/19 at 10:00am. I explained the COVID policy and procedures in place. Pt verbalized understanding of new appt date and time.

## 2019-03-19 ENCOUNTER — Ambulatory Visit: Payer: Self-pay | Admitting: Neurology

## 2019-03-30 MED FILL — PROPRANOLOL ER 60 MG CAP: 60 | 30 days supply | Qty: 30 | Fill #2

## 2019-03-31 ENCOUNTER — Telehealth: Payer: Self-pay | Admitting: Family Medicine

## 2019-03-31 NOTE — Telephone Encounter (Signed)
New Message   Pt is calling about getting a new prescription for a medication he does not take. Pt does not want to go into detail. Please f/u

## 2019-04-01 ENCOUNTER — Other Ambulatory Visit: Payer: Self-pay | Admitting: Family Medicine

## 2019-04-01 DIAGNOSIS — G479 Sleep disorder, unspecified: Secondary | ICD-10-CM

## 2019-04-01 MED ORDER — TRAZODONE HCL 50 MG PO TABS: 25.0000 mg | ORAL_TABLET | Freq: Every evening | ORAL | 0 refills | Status: DC | PRN

## 2019-04-01 MED FILL — traZODone HCL 50 MG TABS: 50 | 30 days supply | Qty: 15 | Fill #0

## 2019-04-01 NOTE — Progress Notes (Signed)
Patient ID: Sean Ward, male   DOB: 26-Jul-1941, 78 y.o.   MRN: [id number]   Patient requesting medication to help with sleep as his Orthopedic appointment in follow-up of back pain is a few weeks away.

## 2019-04-01 NOTE — Telephone Encounter (Signed)
Please ask patient if he kept his follow-up appointment with Orthopedics as he previously stated that it was his back pain that was causing him to have issues with his sleep

## 2019-04-01 NOTE — Telephone Encounter (Signed)
I will review his chart and send in medication

## 2019-04-01 NOTE — Telephone Encounter (Signed)
Spoke with patient and he stated he have an appt next month July 22 with the Orthopedics. Per pt he just need something to help with the sleep for right now.

## 2019-04-01 NOTE — Telephone Encounter (Signed)
Per pt he is having heard time going to sleep and need medication to help. Per pt he tried over the counter and it's not working. Per pt he tried Melatonin and it does not work. Per pt he would like his medication mailed to him.

## 2019-04-01 NOTE — Telephone Encounter (Signed)
Spoke with patient and informed him with what provider stated. Patient verbalized understanding.

## 2019-04-16 ENCOUNTER — Telehealth: Payer: Self-pay | Admitting: Neurology

## 2019-04-16 NOTE — Telephone Encounter (Signed)
I called pt, he wanted his appt r/s. Moved to 04/28/19 at 1:00. Pt verbalized understanding of new appt time and COVID policies.

## 2019-04-16 NOTE — Telephone Encounter (Signed)
Pt returning call please call back °

## 2019-04-17 ENCOUNTER — Telehealth: Payer: Self-pay | Admitting: Family Medicine

## 2019-04-17 NOTE — Telephone Encounter (Signed)
Patient called want to talk about how to take prazodone

## 2019-04-17 NOTE — Telephone Encounter (Signed)
Patients call taken.  Patient identified by name and date of birth.  Patient wants to take 1 whole pill of trazodone instead of a half of a pill.  Permission granted.  Patient advised to call back Monday with results.  Patient acknowledged understanding of advice.

## 2019-04-20 ENCOUNTER — Other Ambulatory Visit: Payer: Self-pay | Admitting: Family Medicine

## 2019-04-20 ENCOUNTER — Telehealth: Payer: Self-pay | Admitting: Interventional Cardiology

## 2019-04-20 DIAGNOSIS — G479 Sleep disorder, unspecified: Secondary | ICD-10-CM

## 2019-04-20 MED FILL — $ELIQUIS 5 MG TABLET: 5 | 30 days supply | Qty: 60 | Fill #3

## 2019-04-20 NOTE — Telephone Encounter (Signed)
New Message  Pt c/o medication issue:  1. Name of Medication: propranolol ER (INDERAL LA) 60 MG 24 hr capsule And apixaban (ELIQUIS) 5 MG TABS tablet    2. How are you currently taking this medication (dosage and times per day)?   3. Are you having a reaction (difficulty breathing--STAT)?   4. What is your medication issue? Patient states that he is having a problem with both medications but would not provide additional information

## 2019-04-20 NOTE — Telephone Encounter (Signed)
Called and spoke to patient. Patient states that he has had anxiety and has had trouble sleeping lately. He states that he is taking trazadone and wants to if it is okay to take with his meds and if Dr. Irish Lack will prescribe anything else to help. Made him aware that there were no interactions with his cardiac meds. Instructed him to reach out to his PCP regarding his anxiety and sleep. Patient states that he drinks several cups of tea a day. Instructed patient to cut back on his caffeine intake to see if that helped.

## 2019-04-21 ENCOUNTER — Telehealth: Payer: Self-pay | Admitting: Family Medicine

## 2019-04-21 NOTE — Telephone Encounter (Signed)
1) Medication(s) Requested (by name): trazodone 2) Pharmacy of Choice: [facility] states they dont have it

## 2019-04-22 ENCOUNTER — Ambulatory Visit: Payer: Medicare Other | Admitting: Family Medicine

## 2019-04-22 NOTE — Telephone Encounter (Signed)
RX sent yesterday 04/21/19

## 2019-04-23 MED FILL — traZODone HCL 50 MG TABS: 50 | 30 days supply | Qty: 30 | Fill #0

## 2019-04-27 MED FILL — PROPRANOLOL ER 60 MG CAP: 60 | 30 days supply | Qty: 30 | Fill #3

## 2019-04-28 ENCOUNTER — Ambulatory Visit: Payer: Self-pay | Admitting: Neurology

## 2019-04-29 ENCOUNTER — Ambulatory Visit: Payer: Medicare Other | Admitting: Family Medicine

## 2019-05-04 ENCOUNTER — Ambulatory Visit: Payer: Medicare Other | Admitting: Family Medicine

## 2019-05-06 ENCOUNTER — Ambulatory Visit: Payer: Medicare Other | Admitting: Family Medicine

## 2019-05-07 ENCOUNTER — Ambulatory Visit: Payer: Self-pay | Admitting: Neurology

## 2019-05-07 ENCOUNTER — Telehealth: Payer: Self-pay

## 2019-05-07 ENCOUNTER — Ambulatory Visit: Payer: Medicare Other | Admitting: Family Medicine

## 2019-05-07 NOTE — Telephone Encounter (Signed)
Pt did not show for their appt with Dr. Athar today.  

## 2019-05-13 ENCOUNTER — Ambulatory Visit: Payer: Self-pay | Admitting: Neurology

## 2019-05-18 MED FILL — $ELIQUIS 5 MG TABLET: 5 | 30 days supply | Qty: 60 | Fill #0

## 2019-05-29 MED FILL — PROPRANOLOL ER 60 MG CAP: 60 | 30 days supply | Qty: 30 | Fill #4

## 2019-06-02 ENCOUNTER — Ambulatory Visit: Payer: Medicare Other | Admitting: Family Medicine

## 2019-06-05 ENCOUNTER — Ambulatory Visit: Payer: Medicare Other | Attending: Family Medicine | Admitting: Internal Medicine

## 2019-06-05 ENCOUNTER — Encounter: Payer: Self-pay | Admitting: Internal Medicine

## 2019-06-05 ENCOUNTER — Other Ambulatory Visit: Payer: Self-pay

## 2019-06-05 DIAGNOSIS — R140 Abdominal distension (gaseous): Secondary | ICD-10-CM

## 2019-06-05 DIAGNOSIS — F419 Anxiety disorder, unspecified: Secondary | ICD-10-CM

## 2019-06-05 MED FILL — CARBIDOPA-LEVODOPA 25-100 T: 25-100 | 60 days supply | Qty: 360 | Fill #1

## 2019-06-05 NOTE — Progress Notes (Signed)
Virtual Visit via Telephone Note Due to current restrictions/limitations of in-office visits due to the COVID-19 pandemic, this scheduled clinical appointment was converted to a telehealth visit  I connected with Sean Ward on 06/05/19 at 2:32 p.m by telephone and verified that I am speaking with the correct person using two identifiers. I am in my office.  The patient is at home.  Only the patient and myself participated in this encounter.  I discussed the limitations, risks, security and privacy concerns of performing an evaluation and management service by telephone and the availability of in person appointments. I also discussed with the patient that there may be a patient responsible charge related to this service. The patient expressed understanding and agreed to proceed.   History of Present Illness: Pt with hx of parkinson, A.fib.  PCP is Dr. Chapman Fitch.  Patient requested urgent care appointment today to discuss abdominal bloating and anxiety  Pt c/o feeling bloated intermittently x 2 wks -occurs after meals.  He has not paid attention as to whether certain types of foods cause the bloating.  He does not feel like he gets full easily.  He has not had any changes in his weight.  He denies any symptoms that would suggest acid reflux.  Denies any nausea or vomiting.  He is moving his bowels okay.  No blood in his stools.  He tells me that he does not eat meat.  He eats a lot of fruits and vegetables.  C/o feeling anxious. Major factor is lack of transportation since middle of 11/2018. Feels isolated as his children are in [place] and he is unable to travel to visit them.  In the process of purchasing a vehicle hopefully by the middle of next wk. he does not like taking medications   Observations/Objective: No direct observation done as this was a telephone encounter  Assessment and Plan: 1. Abdominal bloating Advised patient to pay attention and keep a food diary to what he just ate whenever  he develops bloating of some foods may cause some bloating.  Can also try some Gas-X over-the-counter.  Follow-up with PCP in 2 to 3 weeks if no improvement and bring his food diary with him  2. Anxiety Discussed things to help reduce feelings of anxiety including deep breathing exercises, pleasant imagery, getting adequate sleep at night.  Major issue for him is not having transportation but he tells me that he is in the process of buying a vehicle and should have it by the middle of next week so hopefully this will help decrease his anxiety also   Follow Up Instructions: With PCP in 3 to 4 weeks.   I discussed the assessment and treatment plan with the patient. The patient was provided an opportunity to ask questions and all were answered. The patient agreed with the plan and demonstrated an understanding of the instructions.   The patient was advised to call back or seek an in-person evaluation if the symptoms worsen or if the condition fails to improve as anticipated.  I provided 16 minutes of non-face-to-face time during this encounter.   Karle Plumber, MD

## 2019-06-05 NOTE — Progress Notes (Signed)
Pt states the pain is coming from his back left waist   Pt states this pain has been going on for a while

## 2019-06-08 ENCOUNTER — Telehealth: Payer: Self-pay | Admitting: Interventional Cardiology

## 2019-06-08 NOTE — Telephone Encounter (Signed)
I spoke to the patient and he is wondering what medication Dr Irish Lack would suggest in treating his heartburn.  He is reaching out to his PCP also.    He would like a call from Dr Hassell Done nurse to help explain his "heart condition."  I discussed this with the patient in some detail, but he would like to make an appointment to further discuss.

## 2019-06-08 NOTE — Telephone Encounter (Signed)
  Patient is calling to find out what he can take for his heartburn with his medications he takes for his heart

## 2019-06-09 NOTE — Telephone Encounter (Signed)
Attempted to contact patient but there was no answer and VM did not pick up. 

## 2019-06-09 NOTE — Telephone Encounter (Signed)
Left message for patient to call back  

## 2019-06-09 NOTE — Telephone Encounter (Signed)
New Message ° ° °Patient returning your call. °

## 2019-06-11 NOTE — Telephone Encounter (Signed)
Follow Up:   Pt is returning Brittany's call from 06-09-19

## 2019-06-11 NOTE — Telephone Encounter (Signed)
Please let patient know that he can try OTC famotidine daily or tums as needed. He could also try a PPI such a omeprazole. They do not interact with Eliquis

## 2019-06-11 NOTE — Telephone Encounter (Signed)
Pt advised the [facility] recommendations and will try for a few weeks... and will call his PCP Dr. Chapman Fitch if no improvement.

## 2019-06-11 NOTE — Telephone Encounter (Signed)
Pt saw his PCP Dr. Chapman Fitch 06/05/19 for Reflux that has been worsening over the past 2 weeks..he was advised to try OTC meds for a few weeks and if no improvement to call them back for further assessment.Marland Kitchen   He is asking what we recommend he takes OTC that will not interact with Eliquis.Marland Kitchen advised him that I will forward to our Pharmacist for review and advice.

## 2019-06-17 MED FILL — $ELIQUIS 5 MG TABLET: 5 | 30 days supply | Qty: 60 | Fill #1

## 2019-06-26 MED FILL — PROPRANOLOL ER 60 MG CAP: 60 | 30 days supply | Qty: 30 | Fill #5

## 2019-07-15 MED FILL — $ELIQUIS 5 MG TABLET: 5 | 30 days supply | Qty: 60 | Fill #2

## 2019-07-27 MED FILL — PROPRANOLOL ER 60 MG CAP: 60 | 30 days supply | Qty: 30 | Fill #6

## 2019-08-04 MED FILL — CARBIDOPA-LEVODOPA 25-100 T: 25-100 | 60 days supply | Qty: 360 | Fill #2

## 2019-08-17 MED FILL — $ELIQUIS 5 MG TABLET: 5 | 30 days supply | Qty: 60 | Fill #3

## 2019-08-25 MED FILL — PROPRANOLOL ER 60 MG CAP: 60 | 30 days supply | Qty: 30 | Fill #7

## 2019-09-10 MED FILL — $ELIQUIS 5 MG TABLET: 5 | 30 days supply | Qty: 60 | Fill #4

## 2019-09-20 NOTE — Progress Notes (Deleted)
Cardiology Office Note   Date:  09/20/2019   ID:  Sean Ward, DOB 24-Nov-1940, MRN [id number]  PCP:  Cain Saupe, MD    No chief complaint on file.  AFib  Wt Readings from Last 3 Encounters:  12/19/18 181 lb (82.1 kg)  11/20/18 180 lb 12.8 oz (82 kg)  08/27/18 172 lb (78 kg)       History of Present Illness: Sean Ward is a 78 y.o. male   who was diagnosed with atrial fibrillation in January 2016. He [facility] He was asymptomatic when he initially was diagnosed with a heart rate of 115 220 bpm. His chads score was only 1 and therefore he was not started on anticoagulation. He was started on full dose aspirin. He was treated with Lasix for edema, but this did not seem to help the edema which was thought to be due to venous stasis and varicose veins.Compression stockings have helped.  He has Parkinsons sx. He is benefitting from the medication.  In 2019, aspirin was stopped and Eliquis was started.   Mild left leg edema, chronic.  He has had some pain in the left leg.  It may be from his back.     Past Medical History:  Diagnosis Date  . Anticoagulant long-term use   . Back pain with radiation   . History of bronchitis   . Parkinson's disease (HCC)   . Persistent atrial fibrillation   . Scoliosis of cervicothoracic spine   . Scoliosis of thoracolumbar spine   . Spondylosis of lumbar spine     Past Surgical History:  Procedure Laterality Date  . HERNIA REPAIR    . TONSILLECTOMY       Current Outpatient Medications  Medication Sig Dispense Refill  . apixaban (ELIQUIS) 5 MG TABS tablet TAKE 1 TABLET (5 MG TOTAL) BY MOUTH 2 (TWO) TIMES DAILY. 60 tablet 5  . carbidopa-levodopa (SINEMET IR) 25-100 MG tablet Take 1.5 tablets by mouth 4 (four) times daily. Take 1 1/2 pills 4 times a day, at 8 am, 12 pm and 4 pm and 8 PM. 90 day Rx please 540 tablet 3  . latanoprost (XALATAN) 0.005 % ophthalmic  solution Place 1 drop into both eyes at bedtime.    . Multiple Vitamin (MULTIVITAMIN) tablet Take 1 tablet by mouth daily.    . propranolol ER (INDERAL LA) 60 MG 24 hr capsule TAKE 1 CAPSULE BY MOUTH DAILY. 90 capsule 2  . traZODone (DESYREL) 50 MG tablet Take 1 tablet (50 mg total) by mouth at bedtime. 30 tablet 0   No current facility-administered medications for this visit.     Allergies:   Patient has no known allergies.    Social History:  The patient  reports that he has never smoked. He has never used smokeless tobacco. He reports current alcohol use of about 7.0 standard drinks of alcohol per week. He reports that he does not use drugs.   Family History:  The patient's ***family history includes Deafness in his mother and sister.    ROS:  Please see the history of present illness.   Otherwise, review of systems are positive for ***.   All other systems are reviewed and negative.    PHYSICAL EXAM: VS:  There were no vitals taken for this visit. , BMI There is no height or weight on file to calculate BMI. GEN: Well nourished, well developed, in no acute distress  HEENT: normal  Neck: no JVD, carotid bruits, or masses Cardiac: ***RRR; no murmurs, rubs, or gallops,no edema  Respiratory:  clear to auscultation bilaterally, normal work of breathing GI: soft, nontender, nondistended, + BS MS: no deformity or atrophy  Skin: warm and dry, no rash Neuro:  Strength and sensation are intact Psych: euthymic mood, full affect   EKG:   The ekg ordered today demonstrates ***   Recent Labs: No results found for requested labs within last 8760 hours.   Lipid Panel    Component Value Date/Time   CHOL 191 08/21/2017 1503   TRIG 115 08/21/2017 1503   HDL 50 08/21/2017 1503   CHOLHDL 3.8 08/21/2017 1503   CHOLHDL 3.5 08/16/2016 1038   VLDL 24 08/16/2016 1038   LDLCALC 118 (H) 08/21/2017 1503     Other studies Reviewed: Additional studies/ records that were reviewed today with  results demonstrating: ***.   ASSESSMENT AND PLAN:  1. PAF:  2. LE edema: 3. Fall risk: Parkinsons increases falls risk.    Current medicines are reviewed at length with the patient today.  The patient concerns regarding his medicines were addressed.  The following changes have been made:  No change***  Labs/ tests ordered today include: *** No orders of the defined types were placed in this encounter.   Recommend 150 minutes/week of aerobic exercise Low fat, low carb, high fiber diet recommended  Disposition:   FU in ***   Signed, Larae Grooms, MD  09/20/2019 10:30 PM    [facility] [address], [place], [place]  [address] Phone: [phone]; Fax: [phone]

## 2019-09-21 ENCOUNTER — Telehealth: Payer: Self-pay | Admitting: Family Medicine

## 2019-09-21 NOTE — Telephone Encounter (Signed)
Patient wants to talk about his knee

## 2019-09-22 ENCOUNTER — Ambulatory Visit: Payer: Medicare Other | Admitting: Interventional Cardiology

## 2019-09-22 ENCOUNTER — Other Ambulatory Visit: Payer: Self-pay | Admitting: Family Medicine

## 2019-09-22 DIAGNOSIS — M25561 Pain in right knee: Secondary | ICD-10-CM

## 2019-09-22 MED FILL — PROPRANOLOL ER 60 MG CAP: 60 | 30 days supply | Qty: 30 | Fill #8

## 2019-09-22 NOTE — Telephone Encounter (Signed)
With the prevalence of COVID-19 in the community, he should take precautions such as always wearing a facemask, washing his hands and using hand sanitizer frequently and staying at least 6 feet away from others when possible.  Most children do not get as sick as adults with a contracted COVID-19 however children can have COVID-19 with minimal symptoms and it may be wise for patient to avoid any direct contact with his grandchildren at this time but the decision is up to the patient.  He can take over-the-counter cold medications including Coricidin or the store brand of this medication, he can also take over-the-counter Robitussin-DM or the generic/store brand and he can also take over-the-counter antihistamines such as Benadryl or Claritin/loratadine as needed

## 2019-09-22 NOTE — Telephone Encounter (Signed)
Notify patient that I placed an urgent referral for appointment for him to be seen by Orthopedics about his knee since is having difficulty bearing weight on his knee. He can take tylenol for pain. He can also go to [facility] urgent care if the pain is so bad that he cannot wait for the Orthopedic appointment but he will likely be able to be seen by Ortho this week

## 2019-09-22 NOTE — Telephone Encounter (Signed)
Spoke with patient and he stated the past couple months both knees has been hurting. Per pt he have not fallen it just started hurting one day. Per pt, his right knee hurts the most and he can not put weight on it. Per pt he takes Tylenol off and on when it starts hurting. Patient would like to know what to d about this knee pain. Per pt he states he thinks he mentioned this knee pain to provider in the past but can not remember

## 2019-09-22 NOTE — Telephone Encounter (Signed)
Informed patient with what provider stated in previous message. Per patient he would like to know if it is safe for him to go see his kids with all of this COVID 19 going around. Per pt he lives alone and tries not to go many places unless he needs to.   Patient would also like to know what cold medication is safe for him to take. Per pt he do not have a cold or sick but he's just wondering for just in case.

## 2019-09-22 NOTE — Progress Notes (Signed)
Patient ID: Sean Ward, male   DOB: 03/20/1941, 78 y.o.   MRN: [id number]   Call received that patient with recent increase in right knee pain and now has difficulty bearing weight on the right knee. I will placed urgent Orthopedic referral for patient follow-up

## 2019-09-23 NOTE — Telephone Encounter (Signed)
LMOM

## 2019-09-25 NOTE — Telephone Encounter (Signed)
LMOM

## 2019-09-29 NOTE — Telephone Encounter (Signed)
LMOM

## 2019-10-02 ENCOUNTER — Ambulatory Visit: Payer: Medicare Other | Admitting: Pharmacist

## 2019-10-02 NOTE — Progress Notes (Deleted)
Patient presents for vaccination against influenza per orders of Dr. Fulp. Consent given. Counseling provided. No contraindications exists. Vaccine administered without incident.   

## 2019-10-07 MED FILL — CARBIDOPA-LEVODOPA 25-100 T: 25-100 | 60 days supply | Qty: 360 | Fill #3

## 2019-10-13 MED FILL — $ELIQUIS 5 MG TABLET: 5 | 30 days supply | Qty: 60 | Fill #5

## 2019-10-26 ENCOUNTER — Other Ambulatory Visit: Payer: Self-pay | Admitting: Interventional Cardiology

## 2019-10-26 DIAGNOSIS — R251 Tremor, unspecified: Secondary | ICD-10-CM

## 2019-10-27 MED FILL — PROPRANOLOL ER 60 MG CAP: 60 | 30 days supply | Qty: 30 | Fill #0

## 2019-10-30 ENCOUNTER — Telehealth: Payer: Self-pay

## 2019-10-30 NOTE — Telephone Encounter (Signed)
Patient called to make an appointment for knee pain but will like for the nurse or PCP to give him a call back.  Please advice [phone]

## 2019-11-12 ENCOUNTER — Ambulatory Visit: Payer: Medicare Other | Admitting: Family Medicine

## 2019-11-13 ENCOUNTER — Other Ambulatory Visit: Payer: Self-pay | Admitting: Interventional Cardiology

## 2019-11-16 NOTE — Telephone Encounter (Signed)
Prescription refill request for Eliquis received.  Last office visit: 11/20/2018, Sean Ward Scr: 1.2, 01/06/2018 Age: 79 y.o. Weight: 82.1 kg   Pt overdue for blood work and has an Scientist, research (medical) scheduled with, Dr. Eldridge Ward on 12/16/2019

## 2019-11-17 MED FILL — $ELIQUIS 5 MG TABLET: 5 | 30 days supply | Qty: 60 | Fill #0

## 2019-11-17 NOTE — Telephone Encounter (Signed)
Attempted to call pt and make him aware that he will need updated labs at his up coming appointment with Dr. Eldridge Dace.  Put on appointment note that pt will need blood work for Eliquis follow up. Will refill and give pt a 1 month supply to get him to his appointment.

## 2019-11-25 MED FILL — PROPRANOLOL ER 60 MG CAP: 60 | 30 days supply | Qty: 30 | Fill #1

## 2019-11-27 ENCOUNTER — Ambulatory Visit: Payer: Medicare Other | Admitting: Family Medicine

## 2019-12-07 MED FILL — CARBIDOPA-LEVODOPA 25-100 T: 25-100 | 60 days supply | Qty: 360 | Fill #4

## 2019-12-14 NOTE — Progress Notes (Deleted)
Cardiology Office Note   Date:  12/14/2019   ID:  Sean Ward, DOB 19-Dec-1940, MRN [id number]  PCP:  Antony Blackbird, MD    No chief complaint on file.  Atrial fibrillation  Wt Readings from Last 3 Encounters:  12/19/18 181 lb (82.1 kg)  11/20/18 180 lb 12.8 oz (82 kg)  08/27/18 172 lb (78 kg)       History of Present Illness: Sean Ward is a 79 y.o. male  who was diagnosed with atrial fibrillation in January 2016. He [facility]. He was asymptomatic when he initially was diagnosed with a heart rate of 115 220 bpm. His chads score was only 1 and therefore he was not started on anticoagulation. He was started on full dose aspirin. He was treated with Lasix for edema, but this did not seem to help the edema which was thought to be due to venous stasis and varicose veins.Compression stockings have helped.  He has Parkinsons sx. He is benefitting from the medication.  In the past, aspirin was stopped and Eliquis was started.   He has had mild left leg edema chronically.  Past Medical History:  Diagnosis Date  . Anticoagulant long-term use   . Back pain with radiation   . History of bronchitis   . Parkinson's disease ([place])   . Persistent atrial fibrillation   . Scoliosis of cervicothoracic spine   . Scoliosis of thoracolumbar spine   . Spondylosis of lumbar spine     Past Surgical History:  Procedure Laterality Date  . HERNIA REPAIR    . TONSILLECTOMY       Current Outpatient Medications  Medication Sig Dispense Refill  . carbidopa-levodopa (SINEMET IR) 25-100 MG tablet Take 1.5 tablets by mouth 4 (four) times daily. Take 1 1/2 pills 4 times a day, at 8 am, 12 pm and 4 pm and 8 PM. 90 day Rx please 540 tablet 3  . ELIQUIS 5 MG TABS tablet TAKE 1 TABLET (5 MG TOTAL) BY MOUTH 2 (TWO) TIMES DAILY. 60 tablet 0  . latanoprost (XALATAN) 0.005 % ophthalmic solution Place 1 drop into both eyes at bedtime.      . Multiple Vitamin (MULTIVITAMIN) tablet Take 1 tablet by mouth daily.    . propranolol ER (INDERAL LA) 60 MG 24 hr capsule TAKE 1 CAPSULE BY MOUTH DAILY. 90 capsule 3  . traZODone (DESYREL) 50 MG tablet Take 1 tablet (50 mg total) by mouth at bedtime. 30 tablet 0   No current facility-administered medications for this visit.    Allergies:   Patient has no known allergies.    Social History:  The patient  reports that he has never smoked. He has never used smokeless tobacco. He reports current alcohol use of about 7.0 standard drinks of alcohol per week. He reports that he does not use drugs.   Family History:  The patient's ***family history includes Deafness in his mother and sister.    ROS:  Please see the history of present illness.   Otherwise, review of systems are positive for ***.   All other systems are reviewed and negative.    PHYSICAL EXAM: VS:  There were no vitals taken for this visit. , BMI There is no height or weight on file to calculate BMI. GEN: Well nourished, well developed, in no acute distress HEENT: normal Neck: no JVD, carotid bruits, or masses Cardiac: ***RRR; no murmurs, rubs, or gallops,no edema  Respiratory:  clear to auscultation bilaterally, normal work of breathing GI: soft, nontender, nondistended, + BS MS: no deformity or atrophy Skin: warm and dry, no rash Neuro:  Strength and sensation are intact Psych: euthymic mood, full affect   EKG:   The ekg ordered today demonstrates ***   Recent Labs: No results found for requested labs within last 8760 hours.   Lipid Panel    Component Value Date/Time   CHOL 191 08/21/2017 1503   TRIG 115 08/21/2017 1503   HDL 50 08/21/2017 1503   CHOLHDL 3.8 08/21/2017 1503   CHOLHDL 3.5 08/16/2016 1038   VLDL 24 08/16/2016 1038   LDLCALC 118 (H) 08/21/2017 1503     Other studies Reviewed: Additional studies/ records that were reviewed today with results demonstrating: ***.   ASSESSMENT AND  PLAN:  1. PAF: Rate controlled atrial fibrillation.  Continue propranolol.  Eliquis for stroke prevention. 2. Lower extremity edema: Chronic.  Continue to use compression stockings. 3. Fall risk:    Current medicines are reviewed at length with the patient today.  The patient concerns regarding his medicines were addressed.  The following changes have been made:  No change***  Labs/ tests ordered today include: *** No orders of the defined types were placed in this encounter.   Recommend 150 minutes/week of aerobic exercise Low fat, low carb, high fiber diet recommended  Disposition:   FU in ***   Signed, Lance Muss, MD  12/14/2019 2:39 PM    [facility] [address] [place], [place], [place]  [address] Phone: [phone]; Fax: [phone]

## 2019-12-15 ENCOUNTER — Other Ambulatory Visit: Payer: Self-pay | Admitting: Interventional Cardiology

## 2019-12-15 DIAGNOSIS — I4819 Other persistent atrial fibrillation: Secondary | ICD-10-CM

## 2019-12-15 NOTE — Telephone Encounter (Signed)
Prescription refill request for Eliquis received.  Last office visit: Sean Ward, 11/20/2018 Scr: 1.21, 01/06/2018 Age: 79 yo Weight: 82.1 kg  Pt is overdue for blood work and needs an office visit with Dr. Eldridge Ward.  Called and spoke to pt who stated he will run out of Eliquis on Sunday. Informed him that we can send in a refill (1 month) to get him to his appointment on 3/9. Informed pt after he sees Dr. Eldridge Ward he will need to get labs. Pt agreed. Will send in refill.

## 2019-12-15 NOTE — Telephone Encounter (Signed)
 *  STAT* If patient is at the pharmacy, call can be transferred to refill team.   1. Which medications need to be refilled? (please list name of each medication and dose if known)   ELIQUIS 5 MG TABS tablet  2. Which pharmacy/location (including street and city if local pharmacy) is medication to be sent to?  [facility] [place], [place] - [place] [address]  3. Do they need a 30 day or 90 day supply? 30

## 2019-12-15 NOTE — Telephone Encounter (Signed)
Pt has refill request in refills. Will close this encounter. See refill encounter from 2/23 for further documentation.

## 2019-12-16 ENCOUNTER — Ambulatory Visit: Payer: Medicare Other | Admitting: Family Medicine

## 2019-12-16 ENCOUNTER — Ambulatory Visit: Payer: Medicare Other | Admitting: Interventional Cardiology

## 2019-12-22 ENCOUNTER — Other Ambulatory Visit: Payer: Self-pay

## 2019-12-22 ENCOUNTER — Encounter: Payer: Self-pay | Admitting: Family

## 2019-12-22 ENCOUNTER — Ambulatory Visit: Payer: Medicare Other | Attending: Family | Admitting: Family

## 2019-12-22 VITALS — BP 128/79 | HR 81 | Temp 97.9°F | Ht 66.0 in | Wt 182.0 lb

## 2019-12-22 DIAGNOSIS — R600 Localized edema: Secondary | ICD-10-CM

## 2019-12-22 DIAGNOSIS — Z23 Encounter for immunization: Secondary | ICD-10-CM | POA: Diagnosis not present

## 2019-12-22 DIAGNOSIS — M79604 Pain in right leg: Secondary | ICD-10-CM

## 2019-12-22 DIAGNOSIS — M79605 Pain in left leg: Secondary | ICD-10-CM

## 2019-12-22 MED ORDER — FUROSEMIDE 20 MG PO TABS: 20.0000 mg | ORAL_TABLET | Freq: Every day | ORAL | 0 refills | Status: DC

## 2019-12-22 MED FILL — FUROSEMIDE 20 MG TABS: 20 | 4 days supply | Qty: 4 | Fill #0

## 2019-12-22 NOTE — Progress Notes (Signed)
Patient ID: Sean Ward, male    DOB: [birth year]  MRN: [id number]  CC: Bilateral leg pain  Subjective: Sean Ward is a 79 y.o. male with history of atrial fibrillation, Parkinson disease, bilateral edema of lower extremity, venous stasis, and dyslipidemia who presents for bilateral lower extremity pain.  1. BILATERAL LEG PAIN:  Onset: 7 days ago bilateral leg pain with swelling Location: pain posterior aspect of bilateral legs 9/10 Makes better: Acetaminophen, stretching legs out Makes worse: activity such as walking or standing long periods of time Comments: Says he elevates legs but it doesn't help with swelling. Wearing compression socks do not help. Denies numbness and tingling.Denies chest pain, shortness of breath, trauma, and extended traveling. Taking Eliquis daily without missing doses.  Reports has cardiologist appointment next week.  Patient Active Problem List   Diagnosis Date Noted  . Parkinson disease (HCC) 02/28/2018  . 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 Colon cancer screening 08/16/2016  . Dyslipidemia 08/16/2016  . Unsteady gait 05/24/2016  . Atrial fibrillation (HCC) 10/27/2014  . Irregular heart beat 10/27/2014  . Need for prophylactic vaccination and inoculation against influenza 07/05/2014  . Hernia, inguinal, left 05/03/2014  . Onychomycosis 05/03/2014  . Scrotal hernia - left 04/08/2014  . Inguinal hernia, right 04/08/2014  . Tremor 04/08/2014  . Bilateral edema of lower extremity 03/25/2014  . Venous stasis 03/25/2014  . Left leg cellulitis 08/20/2012  . Superficial phlebitis 08/20/2012     Current Outpatient Medications on File Prior to Visit  Medication Sig Dispense Refill  . carbidopa-levodopa (SINEMET IR) 25-100 MG tablet Take 1.5 tablets by mouth 4 (four) times daily. Take 1 1/2 pills 4 times a day, at 8 am, 12 pm and 4 pm and 8 PM. 90 day Rx please 540 tablet 3  . ELIQUIS 5 MG TABS tablet TAKE 1 TABLET (5 MG TOTAL) BY  MOUTH 2 (TWO) TIMES DAILY. 60 tablet 0  . latanoprost (XALATAN) 0.005 % ophthalmic solution Place 1 drop into both eyes at bedtime.    . Multiple Vitamin (MULTIVITAMIN) tablet Take 1 tablet by mouth daily.    . propranolol ER (INDERAL LA) 60 MG 24 hr capsule TAKE 1 CAPSULE BY MOUTH DAILY. 90 capsule 3  . traZODone (DESYREL) 50 MG tablet Take 1 tablet (50 mg total) by mouth at bedtime. 30 tablet 0   No current facility-administered medications on file prior to visit.    No Known Allergies  Social History   Socioeconomic History  . Marital status: Legally Separated    Spouse name: Not on file  . Number of children: 2  . Years of education: BA  . Highest education level: Not on file  Occupational History  . Not on file  Tobacco Use  . Smoking status: Never Smoker  . Smokeless tobacco: Never Used  Substance and Sexual Activity  . Alcohol use: Yes    Alcohol/week: 7.0 standard drinks    Types: 7 Glasses of wine per week    Comment: 1 glass of red wine with dinner  . Drug use: No  . Sexual activity: Not Currently  Other Topics Concern  . Not on file  Social History Narrative   Drinks 2 cups of coffee a day    Social Determinants of Health   Financial Resource Strain:   . Difficulty of Paying Living Expenses: Not on file  Food Insecurity:   . Worried About Programme researcher, broadcasting/film/video in the Last Year: Not on file  .  Ran Out of Food in the Last Year: Not on file  Transportation Needs:   . Lack of Transportation (Medical): Not on file  . Lack of Transportation (Non-Medical): Not on file  Physical Activity:   . Days of Exercise per Week: Not on file  . Minutes of Exercise per Session: Not on file  Stress:   . Feeling of Stress : Not on file  Social Connections:   . Frequency of Communication with Friends and Family: Not on file  . Frequency of Social Gatherings with Friends and Family: Not on file  . Attends Religious Services: Not on file  . Active Member of Clubs or  Organizations: Not on file  . Attends Archivist Meetings: Not on file  . Marital Status: Not on file  Intimate Partner Violence:   . Fear of Current or Ex-Partner: Not on file  . Emotionally Abused: Not on file  . Physically Abused: Not on file  . Sexually Abused: Not on file    Family History  Problem Relation Age of Onset  . Deafness Mother   . Deafness Sister   . Heart disease Neg Hx     Past Surgical History:  Procedure Laterality Date  . HERNIA REPAIR    . TONSILLECTOMY      ROS: Review of Systems Negative except as stated above  PHYSICAL EXAM: Vitals with BMI 12/22/2019 12/19/2018 11/20/2018  Height 5\' 6"  5\' 6"  5\' 8"   Weight 182 lbs 181 lbs 180 lbs 13 oz  BMI 29.39 57.32 20.2  Systolic 542 706 237  Diastolic 79 70 82  Pulse 81 88 72  Temperature: 97.9 F SpO2: 99%, room air  Physical Exam General appearance - alert, well appearing, and in no distress and oriented to person, place, and time Mental status - alert, oriented to person, place, and time, normal mood, behavior, speech, dress, motor activity, and thought processes Chest - clear to auscultation, no wheezes, rales or rhonchi, symmetric air entry, no tachypnea, retractions or cyanosis Heart - no murmurs noted, no gallops noted, irregularly irregular rhythm with rate  Extremities - pedal edema 2 +; intact peripheral pulses; bilateral feet good pulses, normal color, temperature and sensation; no redness or tenderness in the calves or thighs, no ulcers, gangrene or atrophic changes Skin - normal coloration, no rashes, no suspicious skin lesions noted, bilateral lower extremities cool to touch  CMP Latest Ref Rng & Units 01/06/2018 08/21/2017 05/10/2016  Glucose 65 - 99 mg/dL 95 95 100(H)  BUN 8 - 27 mg/dL 21 19 21   Creatinine 0.76 - 1.27 mg/dL 1.21 1.12 1.11  Sodium 134 - 144 mmol/L 142 143 140  Potassium 3.5 - 5.2 mmol/L 4.6 4.7 4.7  Chloride 96 - 106 mmol/L 104 104 107  CO2 20 - 29 mmol/L 23 25 24     Calcium 8.6 - 10.2 mg/dL 8.8 8.9 9.0  Total Protein 6.0 - 8.5 g/dL - 6.0 6.0(L)  Total Bilirubin 0.0 - 1.2 mg/dL - 0.7 1.3(H)  Alkaline Phos 39 - 117 IU/L - 114 75  AST 0 - 40 IU/L - 27 16  ALT 0 - 44 IU/L - 11 17   Lipid Panel     Component Value Date/Time   CHOL 191 08/21/2017 1503   TRIG 115 08/21/2017 1503   HDL 50 08/21/2017 1503   CHOLHDL 3.8 08/21/2017 1503   CHOLHDL 3.5 08/16/2016 1038   VLDL 24 08/16/2016 1038   LDLCALC 118 (H) 08/21/2017 1503    CBC  Component Value Date/Time   WBC 7.5 01/06/2018 1257   WBC 5.3 05/10/2016 1038   RBC 4.47 01/06/2018 1257   RBC 4.47 05/10/2016 1038   HGB 13.0 01/06/2018 1257   HCT 39.9 01/06/2018 1257   PLT 278 01/06/2018 1257   MCV 89 01/06/2018 1257   MCH 29.1 01/06/2018 1257   MCH 31.3 05/10/2016 1038   MCHC 32.6 01/06/2018 1257   MCHC 34.1 05/10/2016 1038   RDW 16.1 (H) 01/06/2018 1257   LYMPHSABS 636 (L) 05/10/2016 1038   MONOABS 636 05/10/2016 1038   EOSABS 106 05/10/2016 1038   BASOSABS 0 05/10/2016 1038   Results for orders placed or performed in visit on 01/06/18  CBC  Result Value Ref Range   WBC 7.5 3.4 - 10.8 x10E3/uL   RBC 4.47 4.14 - 5.80 x10E6/uL   Hemoglobin 13.0 13.0 - 17.7 g/dL   Hematocrit 93.8 10.1 - 51.0 %   MCV 89 79 - 97 fL   MCH 29.1 26.6 - 33.0 pg   MCHC 32.6 31.5 - 35.7 g/dL   RDW 75.1 (H) 02.5 - 85.2 %   Platelets 278 150 - 379 x10E3/uL  Basic metabolic panel  Result Value Ref Range   Glucose 95 65 - 99 mg/dL   BUN 21 8 - 27 mg/dL   Creatinine, Ser 7.78 0.76 - 1.27 mg/dL   GFR calc non Af Amer 58 (L) >59 mL/min/1.73   GFR calc Af Amer 67 >59 mL/min/1.73   BUN/Creatinine Ratio 17 10 - 24   Sodium 142 134 - 144 mmol/L   Potassium 4.6 3.5 - 5.2 mmol/L   Chloride 104 96 - 106 mmol/L   CO2 23 20 - 29 mmol/L   Calcium 8.8 8.6 - 10.2 mg/dL   ASSESSMENT AND PLAN: 1. Bilateral lower extremity edema: - Comprehensive metabolic panel - CBC - Brain natriuretic peptide - Microalbumin /  creatinine urine ratio - furosemide (LASIX) 20 MG tablet; Take 1 tablet (20 mg total) by mouth daily.  Dispense: 4 tablet; Refill: 0 -Follow-up with attending physician in 7 days  2. Bilateral leg pain: -Continue acetaminophen purchased over-the-counter  Patient was given the opportunity to ask questions.  Patient verbalized understanding of the plan and was able to repeat key elements of the plan. Patient was given clear instructions to go to Emergency Department or return to medical center if symptoms don't improve, worsen, or new problems develop.The patient verbalized understanding.  Requested Prescriptions    No prescriptions requested or ordered in this encounter    Aston Lawhorn Jodi Geralds, NP

## 2019-12-22 NOTE — Patient Instructions (Addendum)
Lasix for bilateral leg edema. Follow-up with attending physician in 7 days.  Edema  Edema is when you have too much fluid in your body or under your skin. Edema may make your legs, feet, and ankles swell up. Swelling is also common in looser tissues, like around your eyes. This is a common condition. It gets more common as you get older. There are many possible causes of edema. Eating too much salt (sodium) and being on your feet or sitting for a long time can cause edema in your legs, feet, and ankles. Hot weather may make edema worse. Edema is usually painless. Your skin may look swollen or shiny. Follow these instructions at home:  Keep the swollen body part raised (elevated) above the level of your heart when you are sitting or lying down.  Do not sit still or stand for a long time.  Do not wear tight clothes. Do not wear garters on your upper legs.  Exercise your legs. This can help the swelling go down.  Wear elastic bandages or support stockings as told by your doctor.  Eat a low-salt (low-sodium) diet to reduce fluid as told by your doctor.  Depending on the cause of your swelling, you may need to limit how much fluid you drink (fluid restriction).  Take over-the-counter and prescription medicines only as told by your doctor. Contact a doctor if:  Treatment is not working.  You have heart, liver, or kidney disease and have symptoms of edema.  You have sudden and unexplained weight gain. Get help right away if:  You have shortness of breath or chest pain.  You cannot breathe when you lie down.  You have pain, redness, or warmth in the swollen areas.  You have heart, liver, or kidney disease and get edema all of a sudden.  You have a fever and your symptoms get worse all of a sudden. Summary  Edema is when you have too much fluid in your body or under your skin.  Edema may make your legs, feet, and ankles swell up. Swelling is also common in looser tissues, like  around your eyes.  Raise (elevate) the swollen body part above the level of your heart when you are sitting or lying down.  Follow your doctor's instructions about diet and how much fluid you can drink (fluid restriction). This information is not intended to replace advice given to you by your health care provider. Make sure you discuss any questions you have with your health care provider. Document Revised: 10/11/2017 Document Reviewed: 10/26/2016 Elsevier Patient Education  2020 [employer].

## 2019-12-23 LAB — COMPREHENSIVE METABOLIC PANEL
ALT: 15 IU/L (ref 0–44)
AST: 24 IU/L (ref 0–40)
Albumin/Globulin Ratio: 1.7 (ref 1.2–2.2)
Albumin: 3.8 g/dL (ref 3.7–4.7)
Alkaline Phosphatase: 120 IU/L — ABNORMAL HIGH (ref 39–117)
BUN/Creatinine Ratio: 21 (ref 10–24)
BUN: 22 mg/dL (ref 8–27)
Bilirubin Total: 0.9 mg/dL (ref 0.0–1.2)
CO2: 26 mmol/L (ref 20–29)
Calcium: 9 mg/dL (ref 8.6–10.2)
Chloride: 105 mmol/L (ref 96–106)
Creatinine, Ser: 1.05 mg/dL (ref 0.76–1.27)
GFR calc Af Amer: 79 mL/min/{1.73_m2} (ref >59)
GFR calc non Af Amer: 68 mL/min/{1.73_m2} (ref >59)
Globulin, Total: 2.3 g/dL (ref 1.5–4.5)
Glucose: 102 mg/dL — ABNORMAL HIGH (ref 65–99)
Potassium: 4.4 mmol/L (ref 3.5–5.2)
Sodium: 142 mmol/L (ref 134–144)
Total Protein: 6.1 g/dL (ref 6.0–8.5)

## 2019-12-23 LAB — MICROALBUMIN / CREATININE URINE RATIO
Creatinine, Urine: 90.5 mg/dL
Microalb/Creat Ratio: 9 mg/g creat (ref 0–29)
Microalbumin, Urine: 8.4 ug/mL

## 2019-12-23 LAB — CBC
Hematocrit: 40.8 % (ref 37.5–51.0)
Hemoglobin: 14.1 g/dL (ref 13.0–17.7)
MCH: 31.7 pg (ref 26.6–33.0)
MCHC: 34.6 g/dL (ref 31.5–35.7)
MCV: 92 fL (ref 79–97)
Platelets: 243 10*3/uL (ref 150–450)
RBC: 4.45 x10E6/uL (ref 4.14–5.80)
RDW: 13.1 % (ref 11.6–15.4)
WBC: 6.8 10*3/uL (ref 3.4–10.8)

## 2019-12-23 LAB — BRAIN NATRIURETIC PEPTIDE: BNP: 357.5 pg/mL — ABNORMAL HIGH (ref 0.0–100.0)

## 2019-12-23 NOTE — Progress Notes (Signed)
Glucose slightly elevated as patient was non-fasting. Alkaline phosphatase slightly elevated. CMP otherwise normal.  CBC normal.   Awaiting results for BNP and microalbumin/creatinine ratio.

## 2019-12-24 MED ORDER — FUROSEMIDE 20 MG PO TABS: 20.0000 mg | ORAL_TABLET | Freq: Every day | ORAL | 0 refills | Status: DC

## 2019-12-24 MED FILL — FUROSEMIDE 20 MG TABS: 20 | 4 days supply | Qty: 4 | Fill #0

## 2019-12-24 NOTE — Progress Notes (Signed)
Called patient with update of all labs. BNP elevated therefore patient should continue furosemide 20 mg daily until next appointment with PCP 3/11. Keep appointment with cardiology for 3/9. Please refer to 3/4 progress note for additional details of the call.

## 2019-12-24 NOTE — Progress Notes (Signed)
Spoke with patient today regarding all lab results with focus on elevated BNP. Patient reports that he took first dose of furosemide on today. Reports bilateral lower extremities are the same as they were at his last visit 2 days ago. Denies shortness of breath and chest pain.   Informed patient that an additional 4 days of furosemide will be added to his current 4 day prescription. Requests medications be mailed to his home, confirmed with pharmacy that patient should receive medications by Monday. The additional 4 days of furosemide added for coverage until he is able to return to see his attending physician on 3/11 at [facility]. Emphasized the importance of patient keeping cardiologist appointment scheduled for 3/9. Counseled patient to report to emergency department should his symptoms worsen and or become severe.

## 2019-12-24 NOTE — Addendum Note (Signed)
Addended by: Rema Fendt on: 12/24/2019 03:41 PM   Modules accepted: Orders

## 2019-12-28 MED FILL — PROPRANOLOL ER 60 MG CAP: 60 | 30 days supply | Qty: 30 | Fill #2

## 2019-12-29 ENCOUNTER — Encounter: Payer: Self-pay | Admitting: Interventional Cardiology

## 2019-12-29 ENCOUNTER — Other Ambulatory Visit: Payer: Medicare Other

## 2019-12-29 ENCOUNTER — Other Ambulatory Visit: Payer: Self-pay

## 2019-12-29 ENCOUNTER — Ambulatory Visit (INDEPENDENT_AMBULATORY_CARE_PROVIDER_SITE_OTHER): Payer: Medicare Other | Admitting: Interventional Cardiology

## 2019-12-29 VITALS — BP 110/70 | HR 90 | Ht 66.0 in | Wt 174.0 lb

## 2019-12-29 DIAGNOSIS — R600 Localized edema: Secondary | ICD-10-CM

## 2019-12-29 DIAGNOSIS — Z9181 History of falling: Secondary | ICD-10-CM | POA: Diagnosis not present

## 2019-12-29 DIAGNOSIS — I4819 Other persistent atrial fibrillation: Secondary | ICD-10-CM | POA: Diagnosis not present

## 2019-12-29 MED ORDER — FUROSEMIDE 20 MG PO TABS: 20.0000 mg | ORAL_TABLET | Freq: Every day | ORAL | 3 refills | Status: DC | PRN

## 2019-12-29 MED FILL — FUROSEMIDE 20 MG TABS: 20 | 30 days supply | Qty: 30 | Fill #0

## 2019-12-29 NOTE — Progress Notes (Signed)
Cardiology Office Note   Date:  12/29/2019   ID:  Sean Ward, DOB October 31, 1940, MRN [id number]  PCP:  Cain Saupe, MD    No chief complaint on file.  Atrial fibrillation  Wt Readings from Last 3 Encounters:  12/29/19 174 lb (78.9 kg)  12/22/19 182 lb (82.6 kg)  12/19/18 181 lb (82.1 kg)       History of Present Illness: Sean Ward is a 79 y.o. male  who was diagnosed with atrial fibrillation in January 2016. [facility]. He was asymptomatic when he initially was diagnosed with a heart rate of 115 220 bpm. His chads score was only 1 and therefore he was not started on anticoagulation. He was started on full dose aspirin. He was treated with Lasix for edema, but this did not seem to help the edema which was thought to be due to venous stasis and varicose veins.Compression stockings have helped.  He has Parkinsons sx. He is benefitting from the medication.  In the past, aspirin was stopped and Eliquis was started.   He has had mild left leg edema chronically.  Since the last visit, he feels that he had a rough 2020.  He lost transportation, could not sleep and became depressed.    Denies : Chest pain. Dizziness. Nitroglycerin use. Orthopnea. Palpitations. Paroxysmal nocturnal dyspnea. Shortness of breath. Syncope.   He had some edema-  Lasix was prescribed.  He had some improvement.   Knee swelling on the right.  Exercise is limited due to knee problems.   Past Medical History:  Diagnosis Date  . Anticoagulant long-term use   . Back pain with radiation   . History of bronchitis   . Parkinson's disease (HCC)   . 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 Scoliosis of cervicothoracic spine   . Scoliosis of thoracolumbar spine   . Spondylosis of lumbar spine     Past Surgical History:  Procedure Laterality Date  . HERNIA REPAIR    . TONSILLECTOMY       Current Outpatient Medications  Medication  Sig Dispense Refill  . carbidopa-levodopa (SINEMET IR) 25-100 MG tablet Take 1.5 tablets by mouth 4 (four) times daily. Take 1 1/2 pills 4 times a day, at 8 am, 12 pm and 4 pm and 8 PM. 90 day Rx please 540 tablet 3  . ELIQUIS 5 MG TABS tablet TAKE 1 TABLET (5 MG TOTAL) BY MOUTH 2 (TWO) TIMES DAILY. 60 tablet 0  . furosemide (LASIX) 20 MG tablet Take 1 tablet (20 mg total) by mouth daily. 4 tablet 0  . latanoprost (XALATAN) 0.005 % ophthalmic solution Place 1 drop into both eyes at bedtime.    Marland Kitchen MELATONIN PO Take by mouth.    . Multiple Vitamin (MULTIVITAMIN) tablet Take 1 tablet by mouth daily.    . propranolol ER (INDERAL LA) 60 MG 24 hr capsule TAKE 1 CAPSULE BY MOUTH DAILY. 90 capsule 3   No current facility-administered medications for this visit.    Allergies:   Patient has no known allergies.    Social History:  The patient  reports that he has never smoked. He has never used smokeless tobacco. He reports current alcohol use of about 7.0 standard drinks of alcohol per week. He reports that he does not use drugs.   Family History:  The patient's family history includes Deafness in his mother and sister.    ROS:  Please see  the history of present illness.   Otherwise, review of systems are positive for knee swelling.   All other systems are reviewed and negative.    PHYSICAL EXAM: VS:  BP 110/70   Pulse 90   Ht 5\' 6"  (1.676 m)   Wt 174 lb (78.9 kg)   SpO2 98%   BMI 28.08 kg/m  , BMI Body mass index is 28.08 kg/m. GEN: Well nourished, well developed, in no acute distress  HEENT: normal  Neck: no JVD, carotid bruits, or masses Cardiac: irregularly irregular; no murmurs, rubs, or gallops, tr pretibial edema  Respiratory:  clear to auscultation bilaterally, normal work of breathing GI: soft, nontender, nondistended, + BS MS: no deformity or atrophy  Skin: warm and dry, no rash Neuro:  Strength and sensation are intact Psych: euthymic mood, full affect   EKG:   The ekg  ordered today demonstrates AFib, rate controlled   Recent Labs: 12/22/2019: ALT 15; BNP 357.5; BUN 22; Creatinine, Ser 1.05; Hemoglobin 14.1; Platelets 243; Potassium 4.4; Sodium 142   Lipid Panel    Component Value Date/Time   CHOL 191 08/21/2017 1503   TRIG 115 08/21/2017 1503   HDL 50 08/21/2017 1503   CHOLHDL 3.8 08/21/2017 1503   CHOLHDL 3.5 08/16/2016 1038   VLDL 24 08/16/2016 1038   LDLCALC 118 (H) 08/21/2017 1503     Other studies Reviewed: Additional studies/ records that were reviewed today with results demonstrating: labs from 12/2019 reviewed.   ASSESSMENT AND PLAN:  1. PAF: Rate controlled atrial fibrillation.  Continue propranolol.  Eliquis for stroke prevention.  Hbg, electrolytes were normal.  2. Lower extremity edema: Chronic.  Continue to use compression stockings.  Some benefit with Lasix.  BNP was elevated- continue Lasix. Will have him use at least prn if swelling resolved.   3. Fall risk:  No recent falls. 4. Knee pain/swelling: May need ortho consult. 5. Will get fasting lipids.    Current medicines are reviewed at length with the patient today.  The patient concerns regarding his medicines were addressed.  The following changes have been made:  No change  Labs/ tests ordered today include:  No orders of the defined types were placed in this encounter.   Recommend 150 minutes/week of aerobic exercise Low fat, low carb, high fiber diet recommended  Disposition:   FU in 1 year   Signed, Larae Grooms, MD  12/29/2019 2:17 PM    [facility] [place], [place], [place]  [address] Phone: [phone]; Fax: [phone]

## 2019-12-29 NOTE — Patient Instructions (Addendum)
Medication Instructions:  Your physician has recommended you make the following change in your medication:   TAKE: furosemide (lasix) 20 mg daily AS NEEDED for swelling or shortness of breath   *If you need a refill on your cardiac medications before your next appointment, please call your pharmacy*   Lab Work: Your physician recommends that you return for a FASTING lipid profile  If you have labs (blood work) drawn today and your tests are completely normal, you will receive your results only by: Marland Kitchen MyChart Message (if you have MyChart) OR . A paper copy in the mail If you have any lab test that is abnormal or we need to change your treatment, we will call you to review the results.   Testing/Procedures: None ordered   Follow-Up: At [facility], you and your health needs are our priority.  As part of our continuing mission to provide you with exceptional heart care, we have created designated Provider Care Teams.  These Care Teams include your primary Cardiologist (physician) and Advanced Practice Providers (APPs -  Physician Assistants and Nurse Practitioners) who all work together to provide you with the care you need, when you need it.  We recommend signing up for the patient portal called "MyChart".  Sign up information is provided on this After Visit Summary.  MyChart is used to connect with patients for Virtual Visits (Telemedicine).  Patients are able to view lab/test results, encounter notes, upcoming appointments, etc.  Non-urgent messages can be sent to your provider as well.   To learn more about what you can do with MyChart, go to [email or web address].    Your next appointment:   12 month(s)  The format for your next appointment:   In Person  Provider:   You may see Lance Muss, MD or one of the following Advanced Practice Providers on your designated Care Team:    Ronie Spies, PA-C  Jacolyn Reedy, PA-C    Other Instructions

## 2019-12-31 ENCOUNTER — Encounter: Payer: Self-pay | Admitting: Family Medicine

## 2019-12-31 ENCOUNTER — Ambulatory Visit: Payer: Medicare Other | Attending: Family Medicine | Admitting: Family Medicine

## 2019-12-31 ENCOUNTER — Other Ambulatory Visit: Payer: Self-pay

## 2019-12-31 VITALS — BP 90/60 | HR 82 | Temp 98.4°F | Resp 16 | Wt 174.0 lb

## 2019-12-31 DIAGNOSIS — I83893 Varicose veins of bilateral lower extremities with other complications: Secondary | ICD-10-CM | POA: Diagnosis not present

## 2019-12-31 DIAGNOSIS — M25561 Pain in right knee: Secondary | ICD-10-CM

## 2019-12-31 DIAGNOSIS — M25461 Effusion, right knee: Secondary | ICD-10-CM | POA: Diagnosis not present

## 2019-12-31 DIAGNOSIS — I9589 Other hypotension: Secondary | ICD-10-CM

## 2019-12-31 DIAGNOSIS — E861 Hypovolemia: Secondary | ICD-10-CM

## 2019-12-31 DIAGNOSIS — R600 Localized edema: Secondary | ICD-10-CM | POA: Diagnosis not present

## 2019-12-31 DIAGNOSIS — I809 Phlebitis and thrombophlebitis of unspecified site: Secondary | ICD-10-CM

## 2019-12-31 MED ORDER — AMOXICILLIN 500 MG PO CAPS: 500.0000 mg | ORAL_CAPSULE | Freq: Two times a day (BID) | ORAL | 0 refills | Status: AC

## 2019-12-31 MED FILL — AMOXICILLIN 500 MG CAPSULE: 500 | 7 days supply | Qty: 14 | Fill #0

## 2019-12-31 NOTE — Progress Notes (Signed)
Pt told to f/u with PCP in 7 days for BLE and pain.   BLE pain and swelling. Pain behind the knees and calf. Right greater than left.  Worsening pain with standing and walking.

## 2019-12-31 NOTE — Progress Notes (Signed)
Subjective:  Patient ID: Sean Ward, male    DOB: [birth year]  Age: 79 y.o. MRN: [id number]  CC: Leg Pain   HPI Sean Ward, 79 year old male, who presents status post recent visit on 01/01/2020 with another provider at which time patient was having issues with bilateral lower extremity edema as well as right leg pain.  He reports that his lower extremity edema has improved after taking the prescribed fluid pills.  He also has seen his heart doctor on 12/29/2019.  He reports that he does continue to take blood thinning medication due to his history of atrial fibrillation.  He denies any unusual bruising or bleeding. He continues to take propranolol for heart rate. He states that his heart doctor told him that his lower extremity swelling had improved.  He was told that he could take the Lasix as needed for swelling.  He reports that he continues however to have significant knee pain.  He reports that a few weeks ago when he initially called for an appointment regarding his knee pain, he was having a great deal of difficulty walking due to the amount of swelling and pain in his right knee.  He had difficulty straightening out his leg due to the pain.  Pain is in the front of the knee in addition to the back of the knee.  He cannot really describe the pain which is sometimes dull, aching and was previously throbbing at times.        He continues to have some mild swelling in both legs, right greater than left.  He also has some discomfort in the right lower leg.  He has not been able to use compression hose due to leg discomfort.  He denies any changes in color to the lower legs or feet.  He has had no issues with chest pain and does not have any sensation of feeling palpitations.  He denies any shortness of breath.  He does have fatigue.  He has had no issues with dizziness or lightheadedness related to his blood pressure.  Past Medical History:  Diagnosis Date  . Anticoagulant long-term use   .  Back pain with radiation   . History of bronchitis   . Parkinson's disease (HCC)   . 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 Scoliosis of cervicothoracic spine   . Scoliosis of thoracolumbar spine   . Spondylosis of lumbar spine     Past Surgical History:  Procedure Laterality Date  . HERNIA REPAIR    . TONSILLECTOMY      Family History  Problem Relation Age of Onset  . Deafness Mother   . Deafness Sister   . Heart disease Neg Hx     Social History   Tobacco Use  . Smoking status: Never Smoker  . Smokeless tobacco: Never Used  Substance Use Topics  . Alcohol use: Yes    Alcohol/week: 7.0 standard drinks    Types: 7 Glasses of wine per week    Comment: 1 glass of red wine with dinner    ROS Review of Systems  Constitutional: Positive for fatigue. Negative for chills and fever.  HENT: Negative for sore throat and trouble swallowing.   Eyes: Negative for photophobia and visual disturbance.  Respiratory: Negative for cough and shortness of breath.   Cardiovascular: Positive for leg swelling. Negative for chest pain and palpitations.  Gastrointestinal: Negative for abdominal pain, blood in stool, constipation, diarrhea and nausea.  Endocrine: Negative for polydipsia, polyphagia and polyuria.  Genitourinary:  Positive for frequency (With use of fluid pill). Negative for dysuria.  Musculoskeletal: Positive for arthralgias, gait problem and joint swelling. Negative for back pain.  Neurological: Negative for dizziness and headaches.  Hematological: Negative for adenopathy. Does not bruise/bleed easily.    Objective:   Today's Vitals: BP (!) 88/62 (BP Location: Left Arm, Patient Position: Sitting, Cuff Size: Normal)   Pulse 82   Temp 98.4 F (36.9 C)   Resp 16   Wt 174 lb (78.9 kg)   SpO2 98%   BMI 28.08 kg/m   Physical Exam Vitals and nursing note reviewed.  Constitutional:      Appearance: Normal appearance.  Neck:     Comments: No JVD Cardiovascular:      Rate and Rhythm: Normal rate. Rhythm irregular.  Pulmonary:     Effort: Pulmonary effort is normal.     Breath sounds: Normal breath sounds. No rales.  Abdominal:     Palpations: Abdomen is soft.     Tenderness: There is no abdominal tenderness. There is no right CVA tenderness, left CVA tenderness or guarding.  Musculoskeletal:        General: Swelling and tenderness present.     Cervical back: Neck supple.     Right lower leg: Edema present.     Left lower leg: Edema present.     Comments: Patient with edema of the right knee which is generalized.  Patient also with generalized increased warmth to the right knee.  Decreased right knee range of motion as patient is unable to fully extend or flex the knee.  No reproducible posterior knee pain.  No discrete palpable area of knee pain.  Knee joint is stable.  Patient with bilateral lower extremity edema right greater than left and patient with slight distention of the varicose veins on the right lower extremity with mild increased warmth to touch  Lymphadenopathy:     Cervical: No cervical adenopathy.  Skin:    General: Skin is warm and dry.  Neurological:     General: No focal deficit present.     Mental Status: He is alert and oriented to person, place, and time.  Psychiatric:        Mood and Affect: Mood normal.        Behavior: Behavior normal.     Assessment & Plan:  1. Pain and swelling of knee, right On examination, patient with swelling/effusion of the right knee and right knee is slightly warm to touch.  Arthritis panel will be done at today's visit.  Suspect possible rheumatoid arthritis.  Patient does not have significant pain with palpation suggestive of gout.  Patient is also being referred to orthopedics for further evaluation and treatment as he continues to have difficulty with ambulation and is unable to fully extend the knee due to the pain and swelling.  Continue use of Tylenol for pain and avoid use of nostril  anti-inflammatories due to current chronic use of Eliquis for anticoagulation to reduce risk of embolic stroke associated with atrial fibrillation. - Arthritis Panel - AMB referral to orthopedics  2. Thrombophlebitis; 4. Varicose veins of both legs with edema Patient with mild distention of varicose veins of the right lower extremity with mild increased warmth.  Patient will be placed on amoxicillin 500 mg twice daily x7 days for thrombophlebitis treatment. - amoxicillin (AMOXIL) 500 MG capsule; Take 1 capsule (500 mg total) by mouth 2 (two) times daily for 7 days.  Dispense: 14 capsule; Refill: 0  3. Bilateral  lower extremity edema Patient with bilateral lower extremity edema that is now improved but  at his last visit at this office, he had elevated BNP.  Patient has seen cardiology in follow-up.  No formal diagnosis of heart failure.  He denies any issues with shortness of breath orthopnea.  Per most recent cardiology note, Lasix can be taken on an as-needed basis.  Patient feels that current swelling is near his baseline. - Basic Metabolic Panel  5. Hypotension due to hypovolemia Patient with low blood pressure today's visit but he denies dizziness.  He is encouraged to hold the use of Lasix at this time.  He will have BMP to look for dehydration/electrolyte abnormality.  He is encouraged to increase hydration. - Basic Metabolic Panel  Outpatient Encounter Medications as of 12/31/2019  Medication Sig  . carbidopa-levodopa (SINEMET IR) 25-100 MG tablet Take 1.5 tablets by mouth 4 (four) times daily. Take 1 1/2 pills 4 times a day, at 8 am, 12 pm and 4 pm and 8 PM. 90 day Rx please  . ELIQUIS 5 MG TABS tablet TAKE 1 TABLET (5 MG TOTAL) BY MOUTH 2 (TWO) TIMES DAILY.  . furosemide (LASIX) 20 MG tablet Take 1 tablet (20 mg total) by mouth daily as needed (swelling or shortness of breath).  . latanoprost (XALATAN) 0.005 % ophthalmic solution Place 1 drop into both eyes at bedtime.  Marland Kitchen MELATONIN PO  Take by mouth.  . Multiple Vitamin (MULTIVITAMIN) tablet Take 1 tablet by mouth daily.  . propranolol ER (INDERAL LA) 60 MG 24 hr capsule TAKE 1 CAPSULE BY MOUTH DAILY.   No facility-administered encounter medications on file as of 12/31/2019.    An After Visit Summary was printed and given to the patient.   Follow-up: Return in about 2 months (around 03/01/2020) for chronic issues and as needed; 1-2 weeks if worsening of knee pain and swelling.   Approximately 30+ minutes spent with patient at today's visit obtaining his current history, review of systems, performing examination and discussing assessment of his current medical issues and treatment plan.  Additional time was spent reviewing patient's recent medical records including his most recent office visit, recent labs and specialty follow-up of cardiology as well as time spent creating and completing today's note.  Antony Blackbird MD

## 2020-01-01 ENCOUNTER — Other Ambulatory Visit: Payer: Self-pay | Admitting: Family Medicine

## 2020-01-01 DIAGNOSIS — M25561 Pain in right knee: Secondary | ICD-10-CM

## 2020-01-01 DIAGNOSIS — M25461 Effusion, right knee: Secondary | ICD-10-CM

## 2020-01-01 DIAGNOSIS — R7689 Other specified abnormal immunological findings in serum: Secondary | ICD-10-CM

## 2020-01-01 DIAGNOSIS — R768 Other specified abnormal immunological findings in serum: Secondary | ICD-10-CM

## 2020-01-01 LAB — BASIC METABOLIC PANEL WITH GFR
BUN/Creatinine Ratio: 20 (ref 10–24)
BUN: 23 mg/dL (ref 8–27)
CO2: 25 mmol/L (ref 20–29)
Calcium: 8.9 mg/dL (ref 8.6–10.2)
Chloride: 104 mmol/L (ref 96–106)
Creatinine, Ser: 1.13 mg/dL (ref 0.76–1.27)
GFR calc Af Amer: 72 mL/min/1.73
GFR calc non Af Amer: 62 mL/min/1.73
Glucose: 106 mg/dL — ABNORMAL HIGH (ref 65–99)
Potassium: 4.4 mmol/L (ref 3.5–5.2)
Sodium: 142 mmol/L (ref 134–144)

## 2020-01-01 LAB — ARTHRITIS PANEL
Anti Nuclear Antibody (ANA): NEGATIVE
Rheumatoid fact SerPl-aCnc: 15.8 [IU]/mL — ABNORMAL HIGH (ref 0.0–13.9)
Sed Rate: 10 mm/h (ref 0–30)
Uric Acid: 4.6 mg/dL (ref 3.8–8.4)

## 2020-01-02 ENCOUNTER — Ambulatory Visit: Payer: Medicare Other

## 2020-01-04 ENCOUNTER — Telehealth: Payer: Self-pay | Admitting: Family Medicine

## 2020-01-04 NOTE — Telephone Encounter (Signed)
Returned call/ Name and DOB verified. Results were reviewed/ Refer to notes

## 2020-01-04 NOTE — Telephone Encounter (Signed)
Patient called and requested to speak with triage nurse Genella Rife. Patient stated that he missed a call from her and wanted to speak with her. Please follow up at your earliest convenience.

## 2020-01-06 ENCOUNTER — Other Ambulatory Visit: Payer: Medicare Other

## 2020-01-07 ENCOUNTER — Ambulatory Visit: Payer: Medicare Other | Admitting: Orthopaedic Surgery

## 2020-01-07 ENCOUNTER — Ambulatory Visit: Payer: Medicare Other | Admitting: Orthopedic Surgery

## 2020-01-14 ENCOUNTER — Encounter: Payer: Self-pay | Admitting: Family Medicine

## 2020-01-14 ENCOUNTER — Ambulatory Visit: Payer: Medicare Other | Attending: Family | Admitting: Family Medicine

## 2020-01-14 ENCOUNTER — Ambulatory Visit: Payer: Medicare Other | Admitting: Family

## 2020-01-14 ENCOUNTER — Other Ambulatory Visit: Payer: Self-pay

## 2020-01-14 VITALS — BP 110/71 | HR 74 | Ht 66.0 in | Wt 181.0 lb

## 2020-01-14 DIAGNOSIS — R768 Other specified abnormal immunological findings in serum: Secondary | ICD-10-CM | POA: Diagnosis not present

## 2020-01-14 DIAGNOSIS — M25561 Pain in right knee: Secondary | ICD-10-CM | POA: Diagnosis not present

## 2020-01-14 DIAGNOSIS — R7689 Other specified abnormal immunological findings in serum: Secondary | ICD-10-CM

## 2020-01-14 DIAGNOSIS — I83893 Varicose veins of bilateral lower extremities with other complications: Secondary | ICD-10-CM | POA: Diagnosis not present

## 2020-01-14 DIAGNOSIS — M25461 Effusion, right knee: Secondary | ICD-10-CM | POA: Diagnosis not present

## 2020-01-14 MED ORDER — PREDNISONE 20 MG PO TABS: ORAL_TABLET | ORAL | 0 refills | Status: DC

## 2020-01-14 MED FILL — predniSONE 20 MG TABS: 20 | 7 days supply | Qty: 6 | Fill #0

## 2020-01-14 NOTE — Progress Notes (Signed)
Subjective:  Patient ID: Sean Ward, male    DOB: [birth year]  Age: 79 y.o. MRN: [id number]  CC: No chief complaint on file.   HPI Sean Ward, 79 year old male who is seen in follow-up of recent visit for right knee pain with swelling and increased warmth.  He reports continued difficulty with ambulation and knee pain remains at about a 6-7 on a 0-to-10 scale.  Pain is worse if he has been sitting and then stands up and attempts to walk.  He believes that he has had some mild decrease in swelling of the right knee.  He feels as if he has some discomfort in the right back of the knee near the midline as well.  He feels that the lower extremity swelling and discomfort in his legs has decreased.  He is wearing his support hose but did not wear them today because they are difficult to take off for examination.  Past Medical History:  Diagnosis Date  . Anticoagulant long-term use   . Back pain with radiation   . History of bronchitis   . Parkinson's disease ([place])   . 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[place])   . Scoliosis of cervicothoracic spine   . Scoliosis of thoracolumbar spine   . Spondylosis of lumbar spine     Past Surgical History:  Procedure Laterality Date  . HERNIA REPAIR    . TONSILLECTOMY      Family History  Problem Relation Age of Onset  . Deafness Mother   . Deafness Sister   . Heart disease Neg Hx     Social History   Tobacco Use  . Smoking status: Never Smoker  . Smokeless tobacco: Never Used  Substance Use Topics  . Alcohol use: Yes    Alcohol/week: 7.0 standard drinks    Types: 7 Glasses of wine per week    Comment: 1 glass of red wine with dinner    ROS Review of Systems  Constitutional: Positive for fatigue. Negative for chills and fever.  HENT: Negative for sore throat and trouble swallowing.   Respiratory: Negative for cough and shortness of breath.   Cardiovascular: Negative for chest pain and palpitations.  Gastrointestinal: Negative for  abdominal pain, blood in stool, constipation, diarrhea and nausea.  Endocrine: Negative for polydipsia, polyphagia and polyuria.  Genitourinary: Negative for dysuria and frequency.  Musculoskeletal: Positive for arthralgias, gait problem and joint swelling.  Neurological: Negative for dizziness and headaches.  Hematological: Negative for adenopathy. Does not bruise/bleed easily.    Objective:   Today's Vitals: BP 110/71 (BP Location: Left Arm, Patient Position: Sitting, Cuff Size: Large)   Pulse 74   Ht 5\' 6"  (1.676 m)   Wt 181 lb (82.1 kg)   SpO2 95%   BMI 29.21 kg/m   Physical Exam Vitals and nursing note reviewed.  Constitutional:      Appearance: Normal appearance.  Cardiovascular:     Rate and Rhythm: Normal rate. Rhythm irregular.  Pulmonary:     Effort: Pulmonary effort is normal.     Breath sounds: Normal breath sounds.  Abdominal:     Palpations: Abdomen is soft.     Tenderness: There is no abdominal tenderness. There is no right CVA tenderness, left CVA tenderness, guarding or rebound.  Musculoskeletal:        General: Tenderness and deformity present.     Right lower leg: No edema.     Left lower leg: No edema.     Comments: He continues to have mild  effusion of the right knee joint, knee is otherwise stable.  He has some generalized discomfort to palpation over the anterior and medial knee as well as decreased knee range of motion.  Knee no longer feels warm to touch.  Patient also now with minimal lower extremity edema, varicose veins are not distended, reddened or warm to touch and no discomfort with palpation over the varicose veins of the lower extremities bilaterally  Skin:    General: Skin is warm and dry.  Neurological:     General: No focal deficit present.     Mental Status: He is alert and oriented to person, place, and time.  Psychiatric:        Mood and Affect: Mood normal.        Behavior: Behavior normal.     Assessment & Plan:  1. Pain and  swelling of knee, right On exam, patient with decreased swelling and warmth of the right knee but patient still with decreased range of motion of the knee, appearance of knee joint enlargement and he states that his pain still remains about a 7 on a 0-to-10 scale when he attempts to stand up after being seated for a while.  He was given information regarding his follow-up appointment time and location with orthopedics. - predniSONE (DELTASONE) 20 MG tablet; Two pills today, then 1 pill daily x 2 days then 1/2 pill daily for 4 days; take after eating  Dispense: 6 tablet; Refill: 0  2. Rheumatoid factor positive Educational material on rheumatoid arthritis given as part of after visit summary.  Prescription provided for prednisone taper.  Patient is somewhat reluctant to try the medication but he was told that he can try the medication as I believe that it will help with his knee pain and stiffness but if he wishes to wait and follow-up with orthopedics then he can do that as well. - predniSONE (DELTASONE) 20 MG tablet; Two pills today, then 1 pill daily x 2 days then 1/2 pill daily for 4 days; take after eating  Dispense: 6 tablet; Refill: 0  3. Varicose veins of both legs with edema He has completed antibiotic therapy and thrombophlebitis from last visit appears to have resolved.  Patient now with minimal lower extremity edema and he states that he is wearing his compression hose.  Outpatient Encounter Medications as of 01/14/2020  Medication Sig  . ELIQUIS 5 MG TABS tablet TAKE 1 TABLET (5 MG TOTAL) BY MOUTH 2 (TWO) TIMES DAILY.  Marland Kitchen latanoprost (XALATAN) 0.005 % ophthalmic solution Place 1 drop into both eyes at bedtime.  Marland Kitchen MELATONIN PO Take by mouth.  . Multiple Vitamin (MULTIVITAMIN) tablet Take 1 tablet by mouth daily.  . propranolol ER (INDERAL LA) 60 MG 24 hr capsule TAKE 1 CAPSULE BY MOUTH DAILY.  . carbidopa-levodopa (SINEMET IR) 25-100 MG tablet Take 1.5 tablets by mouth 4 (four) times  daily. Take 1 1/2 pills 4 times a day, at 8 am, 12 pm and 4 pm and 8 PM. 90 day Rx please  . furosemide (LASIX) 20 MG tablet Take 1 tablet (20 mg total) by mouth daily as needed (swelling or shortness of breath). (Patient not taking: Reported on 01/14/2020)   No facility-administered encounter medications on file as of 01/14/2020.    An After Visit Summary was printed and given to the patient.   Follow-up: An After Visit Summary was printed and given to the patient.    Cain Saupe MD

## 2020-01-14 NOTE — Patient Instructions (Signed)
Rheumatoid Arthritis Rheumatoid arthritis (RA) is a long-term (chronic) disease. RA causes inflammation in your joints. Your joints may feel painful, stiff, swollen, and warm. RA may start slowly. It most often affects the small joints of the hands and feet. It can also affect other parts of the body. Symptoms of RA often come and go. There is no cure for RA, but medicines can help your symptoms. What are the causes?  RA is an autoimmune disease. This means that your body's defense system (immune system) attacks healthy parts of your body by mistake. The exact cause of RA is not known. What increases the risk?  Being a woman.  Having a family history of RA or other diseases like RA.  Smoking.  Being overweight.  Being exposed to pollutants or chemicals. What are the signs or symptoms?  Morning stiffness that lasts longer than 30 minutes. This is often the first symptom.  Symptoms start slowly. They are often worse in the morning.  As RA gets worse, symptoms may include: ? Pain, stiffness, swelling, warmth, and tenderness in joints on both sides of your body. ? Loss of energy. ? Not feeling hungry. ? Weight loss. ? A low fever. ? Dry eyes and a dry mouth. ? Firm lumps that grow under your skin. ? Changes in the way your joints look. ? Changes in the way your joints work.  Symptoms vary and they: ? Often come and go. ? Sometimes get worse for a period of time. These are called flares. How is this treated?   Treatment may include: ? Taking good care of yourself. Be sure to rest as needed, eat a healthy diet, and exercise. ? Medicines. These may include:  Pain relievers.  Medicines to help with inflammation.  Disease-modifying antirheumatic drugs (DMARDs).  Medicines called biologic response modifiers. ? Physical therapy and occupational therapy. ? Surgery, if joint damage is very bad. Your doctor will work with you to find the best treatments. Follow these  instructions at home: Activity  Return to your normal activities as told by your doctor. Ask your doctor what activities are safe for you.  Rest when you have a flare.  Exercise as told by your doctor. General instructions  Take over-the-counter and prescription medicines only as told by your doctor.  Keep all follow-up visits as told by your doctor. This is important. Where to find more information  American College of Rheumatology: www.rheumatology.org  Arthritis Foundation: www.arthritis.org Contact a doctor if:  You have a flare.  You have a fever.  You have problems because of your medicines. Get help right away if:  You have chest pain.  You have trouble breathing.  You get a hot, painful joint all of a sudden, and it is worse than your normal joint aches. Summary  RA is a long-term disease.  Symptoms of RA start slowly. They are often worse in the morning.  RA causes inflammation in your joints. This information is not intended to replace advice given to you by your health care provider. Make sure you discuss any questions you have with your health care provider. Document Revised: 06/11/2018 Document Reviewed: 06/11/2018 Elsevier Patient Education  2020 Elsevier Inc.  

## 2020-01-14 NOTE — Progress Notes (Signed)
Knee pain and

## 2020-01-20 NOTE — Telephone Encounter (Signed)
Pt called asking for a call back because he cant get the covid shot due to just receiving the flu shot

## 2020-01-21 NOTE — Telephone Encounter (Signed)
Please ask him when he had his flu shot done and when he tried to get his COVID shot- how long was he told that he would have to wait until he can have his COVID immunization

## 2020-01-25 ENCOUNTER — Ambulatory Visit: Payer: Medicare Other

## 2020-01-26 ENCOUNTER — Encounter: Payer: Self-pay | Admitting: Orthopedic Surgery

## 2020-01-26 ENCOUNTER — Ambulatory Visit (INDEPENDENT_AMBULATORY_CARE_PROVIDER_SITE_OTHER): Payer: Medicare Other | Admitting: Orthopedic Surgery

## 2020-01-26 ENCOUNTER — Ambulatory Visit: Payer: Self-pay

## 2020-01-26 ENCOUNTER — Other Ambulatory Visit: Payer: Self-pay

## 2020-01-26 VITALS — Ht 66.0 in | Wt 181.0 lb

## 2020-01-26 DIAGNOSIS — M1711 Unilateral primary osteoarthritis, right knee: Secondary | ICD-10-CM | POA: Diagnosis not present

## 2020-01-26 DIAGNOSIS — M25561 Pain in right knee: Secondary | ICD-10-CM | POA: Diagnosis not present

## 2020-01-26 DIAGNOSIS — G8929 Other chronic pain: Secondary | ICD-10-CM | POA: Diagnosis not present

## 2020-01-26 DIAGNOSIS — M25461 Effusion, right knee: Secondary | ICD-10-CM

## 2020-01-26 MED FILL — PROPRANOLOL ER 60 MG CAP: 60 | 30 days supply | Qty: 30 | Fill #3

## 2020-01-26 NOTE — Telephone Encounter (Signed)
Spoke with patient and informed him with what provider stated. Per pt chart, his Flu vaccine was given to him 12-22-19. Per pt he was told that in order for him to get the Covid Vaccine, he needs a note from the provider saying he can get the vaccine.

## 2020-01-27 ENCOUNTER — Encounter: Payer: Self-pay | Admitting: Orthopedic Surgery

## 2020-01-27 DIAGNOSIS — M1711 Unilateral primary osteoarthritis, right knee: Secondary | ICD-10-CM | POA: Diagnosis not present

## 2020-01-27 DIAGNOSIS — M25561 Pain in right knee: Secondary | ICD-10-CM | POA: Diagnosis not present

## 2020-01-27 DIAGNOSIS — M25461 Effusion, right knee: Secondary | ICD-10-CM

## 2020-01-27 DIAGNOSIS — G8929 Other chronic pain: Secondary | ICD-10-CM | POA: Diagnosis not present

## 2020-01-27 MED ORDER — METHYLPREDNISOLONE ACETATE 40 MG/ML IJ SUSP
40.0000 mg | INTRAMUSCULAR | Status: AC | PRN
  Administered 2020-01-27: 40 mg via INTRA_ARTICULAR

## 2020-01-27 MED ORDER — LIDOCAINE HCL (PF) 1 % IJ SOLN
5.0000 mL | INTRAMUSCULAR | Status: AC | PRN
  Administered 2020-01-27: 5 mL

## 2020-01-27 NOTE — Progress Notes (Signed)
Office Visit Note   Patient: Sean Ward           Date of Birth: April 19, 1941           MRN: [id number] Visit Date: 01/26/2020              Requested by: Cain Saupe, MD [address] [place],  [place] [address] PCP: Cain Saupe, MD  Chief Complaint  Patient presents with  . Right Knee - Pain      HPI: Patient is a 79 year old gentleman who notes increasing arthritic pain in his right knee with swelling and decreased range of motion.  Patient states he was given a prednisone taper without relief.  He states he does have a mildly positive rheumatoid factor.  Assessment & Plan: Visit Diagnoses:  1. Chronic pain of right knee   2. Effusion, right knee     Plan: Patient is right knee was aspirated and injected he tolerated this well he had immediate relief of his symptoms.  Reevaluate in 4 weeks.  Follow-Up Instructions: Return in about 4 weeks (around 02/23/2020).   Ortho Exam  Patient is alert, oriented, no adenopathy, well-dressed, normal affect, normal respiratory effort. Examination patient has valgus alignment to the right knee with ambulation.  There is crepitation with range of motion tenderness to palpation of the medial lateral joint line as well as a tense effusion the knee was aspirated 40 cc of clear serous fluid.  Imaging: No results found. No images are attached to the encounter.  Labs: Lab Results  Component Value Date   ESRSEDRATE 10 12/31/2019   LABURIC 4.6 12/31/2019     Lab Results  Component Value Date   ALBUMIN 3.8 12/22/2019   ALBUMIN 3.7 08/21/2017   ALBUMIN 3.6 05/10/2016   LABURIC 4.6 12/31/2019    Lab Results  Component Value Date   MG 2.0 10/27/2014   No results found for: VD25OH  No results found for: PREALBUMIN CBC EXTENDED Latest Ref Rng & Units 12/22/2019 01/06/2018 05/10/2016  WBC 3.4 - 10.8 x10E3/uL 6.8 7.5 5.3  RBC 4.14 - 5.80 x10E6/uL 4.45 4.47 4.47  HGB 13.0 - 17.7 g/dL 48.5 46.2 70.3  HCT 50.0 - 51.0 % 40.8 39.9  41.0  PLT 150 - 450 x10E3/uL 243 278 246  NEUTROABS 1,500 - 7,800 cells/uL - - 3,922  LYMPHSABS 850 - 3,900 cells/uL - - 636(L)     Body mass index is 29.21 kg/m.  Orders:  Orders Placed This Encounter  Procedures  . XR Knee 1-2 Views Right   No orders of the defined types were placed in this encounter.    Procedures: Large Joint Inj: R knee on 01/27/2020 5:06 PM Indications: pain and diagnostic evaluation Details: 22 G 1.5 in needle, superolateral approach  Arthrogram: No  Medications: 5 mL lidocaine (PF) 1 %; 40 mg methylPREDNISolone acetate 40 MG/ML Aspirate: 40 mL clear Outcome: tolerated well, no immediate complications Procedure, treatment alternatives, risks and benefits explained, specific risks discussed. Consent was given by the patient. Immediately prior to procedure a time out was called to verify the correct patient, procedure, equipment, support staff and site/side marked as required. Patient was prepped and draped in the usual sterile fashion.      Clinical Data: No additional findings.  ROS:  All other systems negative, except as noted in the HPI. Review of Systems  Objective: Vital Signs: Ht 5\' 6"  (1.676 m)   Wt 181 lb (82.1 kg)   BMI 29.21 kg/m  Specialty Comments:  No specialty comments available.  PMFS History: Patient Active Problem List   Diagnosis Date Noted  . Parkinson disease ([facility]) 02/28/2018  . 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 Colon cancer screening 08/16/2016  . Dyslipidemia 08/16/2016  . Unsteady gait 05/24/2016  . Atrial fibrillation ([facility]) 10/27/2014  . Irregular heart beat 10/27/2014  . Need for prophylactic vaccination and inoculation against influenza 07/05/2014  . Hernia, inguinal, left 05/03/2014  . Onychomycosis 05/03/2014  . Scrotal hernia - left 04/08/2014  . Inguinal hernia, right 04/08/2014  . Tremor 04/08/2014  . Bilateral edema of lower extremity 03/25/2014  . Venous stasis 03/25/2014  .  Left leg cellulitis 08/20/2012  . Superficial phlebitis 08/20/2012   Past Medical History:  Diagnosis Date  . Anticoagulant long-term use   . Back pain with radiation   . History of bronchitis   . Parkinson's disease ([facility])   . 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[facility])   . Scoliosis of cervicothoracic spine   . Scoliosis of thoracolumbar spine   . Spondylosis of lumbar spine     Family History  Problem Relation Age of Onset  . Deafness Mother   . Deafness Sister   . Heart disease Neg Hx     Past Surgical History:  Procedure Laterality Date  . HERNIA REPAIR    . TONSILLECTOMY     Social History   Occupational History  . Not on file  Tobacco Use  . Smoking status: Never Smoker  . Smokeless tobacco: Never Used  Substance and Sexual Activity  . Alcohol use: Yes    Alcohol/week: 7.0 standard drinks    Types: 7 Glasses of wine per week    Comment: 1 glass of red wine with dinner  . Drug use: No  . Sexual activity: Not Currently

## 2020-01-28 ENCOUNTER — Ambulatory Visit: Payer: Medicare Other | Admitting: Family Medicine

## 2020-02-03 ENCOUNTER — Other Ambulatory Visit: Payer: Self-pay

## 2020-02-03 ENCOUNTER — Ambulatory Visit: Payer: Medicare Other | Attending: Family Medicine | Admitting: Physician Assistant

## 2020-02-03 DIAGNOSIS — R609 Edema, unspecified: Secondary | ICD-10-CM | POA: Diagnosis not present

## 2020-02-03 NOTE — Progress Notes (Signed)
Virtual Visit via Telephone Note  I connected with [name] on 02/03/20 at 11:10 AM EDT by telephone and verified that I am speaking with the correct person using two identifiers.   I discussed the limitations, risks, security and privacy concerns of performing an evaluation and management service by telephone and the availability of in person appointments. I also discussed with the patient that there may be a patient responsible charge related to this service. The patient expressed understanding and agreed to proceed.  PATIENT visit by telephone virtually in the context of Covid-19 pandemic. Patient location:  home My Location:  [facility] office Persons on the call:  Me and the patient   History of Present Illness:  The patient today has complaints of B leg swelling.  He has seen Dr Jillyn Hidden for this and his cardiology addressed it last month.  He denies SOB.  No CP.  No palpitations.  He has not been taking lasix.  He has many questions that we discussed and I answered.      Observations/Objective:  NAD.  A&Ox3   Assessment and Plan: 1. Edema, unspecified type Take lasix at lowest interval(once weekly, then twice weekly, etc) as directed by cardiology previously.  Elevate legs.  Decrease sodium and sugar intake.  He may also obtain compression stocl=kings.  This is not a new problem nor has it worsened.  BNP noted to be elevated by cardiologist at last visit ~1 month ago.      Follow Up Instructions: See PCP in May as scheduled.     I discussed the assessment and treatment plan with the patient. The patient was provided an opportunity to ask questions and all were answered. The patient agreed with the plan and demonstrated an understanding of the instructions.   The patient was advised to call back or seek an in-person evaluation if the symptoms worsen or if the condition fails to improve as anticipated.  I provided 16 minutes of non-face-to-face time during this encounter.   Georgian Co, PA-C  Patient ID: Sean Ward, male   DOB: Dec 01, 1940, 79 y.o.   MRN: [id number]

## 2020-02-05 ENCOUNTER — Other Ambulatory Visit: Payer: Self-pay | Admitting: Neurology

## 2020-02-05 DIAGNOSIS — G20 Parkinson's disease: Secondary | ICD-10-CM

## 2020-02-05 MED FILL — CARBIDOPA-LEVODOPA 25-100 T: 25-100 | 30 days supply | Qty: 180 | Fill #5

## 2020-02-15 ENCOUNTER — Ambulatory Visit: Payer: Medicare Other | Attending: Internal Medicine | Admitting: Family Medicine

## 2020-02-15 ENCOUNTER — Encounter: Payer: Self-pay | Admitting: Family Medicine

## 2020-02-15 ENCOUNTER — Telehealth: Payer: Self-pay | Admitting: *Deleted

## 2020-02-15 ENCOUNTER — Other Ambulatory Visit: Payer: Self-pay

## 2020-02-15 DIAGNOSIS — M4722 Other spondylosis with radiculopathy, cervical region: Secondary | ICD-10-CM | POA: Diagnosis not present

## 2020-02-15 DIAGNOSIS — M542 Cervicalgia: Secondary | ICD-10-CM

## 2020-02-15 MED ORDER — PREDNISONE 20 MG PO TABS: ORAL_TABLET | ORAL | 0 refills | Status: DC

## 2020-02-15 MED ORDER — TRAMADOL HCL 50 MG PO TABS: 50.0000 mg | ORAL_TABLET | Freq: Three times a day (TID) | ORAL | 0 refills | Status: DC | PRN

## 2020-02-15 MED FILL — predniSONE 20 MG TABS: 20 | 8 days supply | Qty: 8 | Fill #0

## 2020-02-15 MED FILL — traMADol HCL 50 MG TABS: 50 | 5 days supply | Qty: 15 | Fill #0
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
## 2020-02-15 NOTE — Progress Notes (Signed)
Neck pain

## 2020-02-15 NOTE — Telephone Encounter (Signed)
Spoke with patient and he stated he wants letter mailed.

## 2020-02-15 NOTE — Progress Notes (Signed)
Patient ID: Sean Ward, male   DOB: 04/07/1941, 79 y.o.   MRN: [id number]   Patient requested letter giving permission for him to receive COVID-19 immunization as he received influenza immunization in early March and had been told that he had to delay his COVID-19 immunization.  Patient will be contacted to see if he would like the letter mailed or if he will pick up the letter from this office.

## 2020-02-15 NOTE — Telephone Encounter (Signed)
Letter written for patient and is in the gray folder. Please see if patient would like to pick up or have letter mailed to him

## 2020-02-15 NOTE — Telephone Encounter (Signed)
Opened in Error.

## 2020-02-16 ENCOUNTER — Ambulatory Visit: Payer: Medicare Other | Admitting: Internal Medicine

## 2020-02-16 NOTE — Telephone Encounter (Signed)
Patient came into office and picked up copy of the letter. Per pt to see if provider can put in referral for him to go to the chiropractor due to neck still hurting

## 2020-02-17 NOTE — Telephone Encounter (Signed)
I placed a referral at the time of patient's visit for him to follow-up with orthopedics regarding his neck pain.  Is there a certain chiropractor that he is interested in because we would need to know the person's name or name of the office in order to make the referral.  I am not sure if his insurance covers chiropractic care.  You can also offer him a referral to physical therapy regarding his neck pain.

## 2020-02-17 NOTE — Telephone Encounter (Signed)
LMOM

## 2020-02-18 ENCOUNTER — Telehealth: Payer: Self-pay | Admitting: Family Medicine

## 2020-02-18 NOTE — Telephone Encounter (Signed)
Patient called in and stated that he missed pcps call. Please follow up at your earliest convenience.

## 2020-02-19 ENCOUNTER — Telehealth: Payer: Self-pay | Admitting: Family Medicine

## 2020-02-19 NOTE — Telephone Encounter (Signed)
Patient  Called in to inform pcp that the pain is becoming unbareable. Patient requested for a call. Please follow up at your earliest convenience.

## 2020-02-22 NOTE — Telephone Encounter (Signed)
Patient called saying the pain in his neck is getting worse and would like to know of he can see a chiropractic. Please f/u

## 2020-02-22 NOTE — Telephone Encounter (Signed)
Spoke with patient.

## 2020-02-22 NOTE — Telephone Encounter (Signed)
Spoke with patient and he stated he will hold off on the chiropractor for now and just see his current orthopedics doctor

## 2020-02-22 NOTE — Telephone Encounter (Signed)
Informed patient with what provider stated and he verbalized understanding and stated he will wait for now on the referral to the chiropractor and the physical therapist.

## 2020-02-23 ENCOUNTER — Ambulatory Visit: Payer: Medicare Other | Admitting: Orthopedic Surgery

## 2020-02-23 MED FILL — PROPRANOLOL ER 60 MG CAP: 60 | 30 days supply | Qty: 30 | Fill #4

## 2020-02-24 ENCOUNTER — Ambulatory Visit: Payer: Medicare Other | Attending: Family Medicine | Admitting: Family Medicine

## 2020-02-24 ENCOUNTER — Other Ambulatory Visit: Payer: Self-pay

## 2020-02-24 ENCOUNTER — Encounter: Payer: Self-pay | Admitting: Family Medicine

## 2020-02-24 ENCOUNTER — Telehealth: Payer: Self-pay | Admitting: Family Medicine

## 2020-02-24 VITALS — BP 126/84 | HR 89 | Temp 97.7°F | Ht 66.0 in | Wt 179.0 lb

## 2020-02-24 DIAGNOSIS — R600 Localized edema: Secondary | ICD-10-CM

## 2020-02-24 DIAGNOSIS — G8929 Other chronic pain: Secondary | ICD-10-CM

## 2020-02-24 DIAGNOSIS — M4722 Other spondylosis with radiculopathy, cervical region: Secondary | ICD-10-CM

## 2020-02-24 DIAGNOSIS — R609 Edema, unspecified: Secondary | ICD-10-CM | POA: Diagnosis not present

## 2020-02-24 DIAGNOSIS — M25561 Pain in right knee: Secondary | ICD-10-CM

## 2020-02-24 MED ORDER — HYDROCODONE-ACETAMINOPHEN 5-300 MG PO TABS: ORAL_TABLET | ORAL | 0 refills | Status: DC

## 2020-02-24 MED ORDER — HYDROCODONE-ACETAMINOPHEN 5-325 MG PO TABS: 1.0000 | ORAL_TABLET | Freq: Four times a day (QID) | ORAL | 0 refills | Status: DC | PRN

## 2020-02-24 MED ORDER — METHYLPREDNISOLONE ACETATE 40 MG/ML IJ SUSP
40.0000 mg | Freq: Once | INTRAMUSCULAR | Status: AC
  Administered 2020-02-24: 40 mg via INTRAMUSCULAR

## 2020-02-24 NOTE — Telephone Encounter (Signed)
Patients pharmacy called to inform pcp that the [facility]  does not have listed medication in the pharmacy. He stated that it would take up to 3 days to receive it if they are able to order it. He stated that prescription is being taken off of the market, so most stores are just using their last supply. Rep stated that [phone] and [facility] [address] [phone]  has some still in stock if you wanted to transfer medication over there. Please follow up at your earliest convenience.  HYDROcodone-Acetaminophen 5-300 MG TABS [[id number]]

## 2020-02-24 NOTE — Telephone Encounter (Signed)
Patient called saying that his PCP prescribed him HYDROcodone-Acetaminophen 5-300 MG TABS and [facility]. Please f/u

## 2020-02-24 NOTE — Telephone Encounter (Signed)
Please call [facility] to let them know that RX was changed to hydrocodone-apap 5/325 mg and confirm that they have the medication and if they have the medication, please call patient and let him know that he can pick it up from there

## 2020-02-24 NOTE — Telephone Encounter (Signed)
I will resend RX for 5-325 mg of hydrocodone-acetaminophen

## 2020-02-24 NOTE — Progress Notes (Signed)
Back pain and leg pain

## 2020-02-24 NOTE — Progress Notes (Signed)
Established Patient Office Visit  Subjective:  Patient ID: Sean Ward, male    DOB: 05-28-1941  Age: 79 y.o. MRN: [id number]  CC: No chief complaint on file.   HPI Sean Ward, 79 year old male, who presents with complaint of continued right-sided neck pain with radiation of pain into the right upper back/shoulder area.  He has also had issues with chronic right knee pain but states that when he saw orthopedics, they were able to remove some the fluid from his knee which caused some improvement in pain.  Per patient he was told that he may need surgery on his knee and patient is trying to decide if he would like to have this done.  He thinks that if he does not have the knee surgery that he will have to recurrently go to orthopedics as he was told that he will have recurrent knee pain.  He reports that he did not receive any significant improvement in his right-sided neck pain with the medication prescribed at his most recent visit.  He reports that he has used a heating pad at home but per instructions he can only use this for about 20 minutes.  He has increased pain if he tries to lie down especially on the right side.  He reports that he has been trying to sleep in a semi-reclined position but that he is not getting much sleep.  He reports that the pain is a 9-10 or greater than 10 on a 0-to-10 scale.  He denies any numbness or tingling in his right arm or hand.  Pain is sharp and is confined to the right side of the neck.         He reports continued issues with swelling in his lower legs.  He is try to keep his legs elevated.  He has not really been using his support hose/compression stockings as he is trouble pulling them on due to the neck pain.  He has not been taking the Lasix as he was not sure how often he should take this medication for the swelling.  He has some leg discomfort secondary to swelling but denies actual leg pain.  He has more swelling on the right than left.  Past  Medical History:  Diagnosis Date  . Anticoagulant long-term use   . Back pain with radiation   . History of bronchitis   . Parkinson's disease ([facility])   . 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 Scoliosis of cervicothoracic spine   . Scoliosis of thoracolumbar spine   . Spondylosis of lumbar spine     Past Surgical History:  Procedure Laterality Date  . HERNIA REPAIR    . TONSILLECTOMY      Family History  Problem Relation Age of Onset  . Deafness Mother   . Deafness Sister   . Heart disease Neg Hx     Social History   Socioeconomic History  . Marital status: Legally Separated    Spouse name: Not on file  . Number of children: 2  . Years of education: BA  . Highest education level: Not on file  Occupational History  . Not on file  Tobacco Use  . Smoking status: Never Smoker  . Smokeless tobacco: Never Used  Substance and Sexual Activity  . Alcohol use: Yes    Alcohol/week: 7.0 standard drinks    Types: 7 Glasses of wine per week    Comment: 1 glass of red wine with dinner  . Drug use: No  .  Sexual activity: Not Currently  Other Topics Concern  . Not on file  Social History Narrative   Drinks 2 cups of coffee a day    Social Determinants of Health   Financial Resource Strain:   . Difficulty of Paying Living Expenses:   Food Insecurity:   . Worried About Programme researcher, broadcasting/film/video in the Last Year:   . Barista in the Last Year:   Transportation Needs:   . Freight forwarder (Medical):   Marland Kitchen Lack of Transportation (Non-Medical):   Physical Activity:   . Days of Exercise per Week:   . Minutes of Exercise per Session:   Stress:   . Feeling of Stress :   Social Connections:   . Frequency of Communication with Friends and Family:   . Frequency of Social Gatherings with Friends and Family:   . Attends Religious Services:   . Active Member of Clubs or Organizations:   . Attends Banker Meetings:   Marland Kitchen Marital Status:   Intimate Partner  Violence:   . Fear of Current or Ex-Partner:   . Emotionally Abused:   Marland Kitchen Physically Abused:   . Sexually Abused:     Outpatient Medications Prior to Visit  Medication Sig Dispense Refill  . carbidopa-levodopa (SINEMET IR) 25-100 MG tablet Take 1.5 tablets by mouth 4 (four) times daily. Take 1 1/2 pills 4 times a day, at 8 am, 12 pm and 4 pm and 8 PM. 90 day Rx please 540 tablet 3  . ELIQUIS 5 MG TABS tablet TAKE 1 TABLET (5 MG TOTAL) BY MOUTH 2 (TWO) TIMES DAILY. 60 tablet 0  . furosemide (LASIX) 20 MG tablet Take 1 tablet (20 mg total) by mouth daily as needed (swelling or shortness of breath). 90 tablet 3  . latanoprost (XALATAN) 0.005 % ophthalmic solution Place 1 drop into both eyes at bedtime.    Marland Kitchen MELATONIN PO Take by mouth.    . Multiple Vitamin (MULTIVITAMIN) tablet Take 1 tablet by mouth daily.    . predniSONE (DELTASONE) 20 MG tablet Two pills once a day for 2 days then 1 pill daily x 2 days,  1/2 pill daily for 4 days; take after eating 8 tablet 0  . propranolol ER (INDERAL LA) 60 MG 24 hr capsule TAKE 1 CAPSULE BY MOUTH DAILY. 90 capsule 3   No facility-administered medications prior to visit.    No Known Allergies  ROS Review of Systems  Constitutional: Positive for fatigue. Negative for chills and fever.  HENT: Negative for sore throat and trouble swallowing.   Respiratory: Negative for cough and shortness of breath.   Cardiovascular: Positive for leg swelling. Negative for chest pain and palpitations.  Gastrointestinal: Negative for abdominal pain, blood in stool, constipation, diarrhea and nausea.  Endocrine: Negative for polydipsia, polyphagia and polyuria.  Genitourinary: Negative for dysuria and frequency.  Musculoskeletal: Positive for arthralgias and gait problem.  Neurological: Negative for dizziness and headaches.  Hematological: Negative for adenopathy. Does not bruise/bleed easily.      Objective:    Physical Exam  Constitutional: He is oriented to  person, place, and time. He appears well-developed and well-nourished. He appears distressed.  WNWD elderly male appearing to not feel well/be in pain. Patient sitting in chair with right side of head resting against the wall and holding onto his right arm  Neck: No JVD present.  Cardiovascular: Normal rate and regular rhythm.  Appear to be in regular rhythm but with ectopics  versus atrial fibrillation  Pulmonary/Chest: Effort normal.  Abdominal: Soft. There is no abdominal tenderness. There is no rebound and no guarding.  Musculoskeletal:        General: Tenderness and edema present.     Comments: Bilateral LE edema, right greater than left. Pretibial pitting to mid shin on the right.  Tenderness to palp and spasm of the right neck/trapezius and sternocleiomastoid with decreased neck ROM.  Lymphadenopathy:    He has no cervical adenopathy.  Neurological: He is alert and oriented to person, place, and time.  Skin: Skin is warm and dry. There is erythema (chronic LE skin changes with redness but no increaesed warmth).  Psychiatric: He has a normal mood and affect. His behavior is normal.  Nursing note and vitals reviewed.   BP 126/84   Pulse 89   Temp 97.7 F (36.5 C) (Temporal)   Ht 5\' 6"  (1.676 m)   Wt 179 lb (81.2 kg)   SpO2 95%   BMI 28.89 kg/m  Wt Readings from Last 3 Encounters:  02/24/20 179 lb (81.2 kg)  01/26/20 181 lb (82.1 kg)  01/14/20 181 lb (82.1 kg)     Health Maintenance Due  Topic Date Due  . COVID-19 Vaccine (1) Never done  . PNA vac Low Risk Adult (1 of 2 - PCV13) Never done      Lab Results  Component Value Date   TSH 2.270 08/21/2017   Lab Results  Component Value Date   WBC 6.8 12/22/2019   HGB 14.1 12/22/2019   HCT 40.8 12/22/2019   MCV 92 12/22/2019   PLT 243 12/22/2019   Lab Results  Component Value Date   NA 142 12/31/2019   K 4.4 12/31/2019   CO2 25 12/31/2019   GLUCOSE 106 (H) 12/31/2019   BUN 23 12/31/2019   CREATININE 1.13  12/31/2019   BILITOT 0.9 12/22/2019   ALKPHOS 120 (H) 12/22/2019   AST 24 12/22/2019   ALT 15 12/22/2019   PROT 6.1 12/22/2019   ALBUMIN 3.8 12/22/2019   CALCIUM 8.9 12/31/2019   Lab Results  Component Value Date   CHOL 191 08/21/2017   Lab Results  Component Value Date   HDL 50 08/21/2017   Lab Results  Component Value Date   LDLCALC 118 (H) 08/21/2017   Lab Results  Component Value Date   TRIG 115 08/21/2017   Lab Results  Component Value Date   CHOLHDL 3.8 08/21/2017   No results found for: HGBA1C    Assessment & Plan:  1. 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  Patient given 40 mg IM injection x1 of Depo-Medrol here in the office and prescription for hydrocodone to take as needed for more severe pain.  He does have appointment tomorrow with orthopedics regarding his neck pain - methylPREDNISolone acetate (DEPO-MEDROL) injection 40 mg - HYDROcodone-acetaminophen (NORCO/VICODIN) 5-325 MG tablet; Take 1 tablet by mouth every 6 (six) hours as needed for moderate pain.  Dispense: 30 tablet; Refill: 0  2. 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  Prescription for hydrocodone for more severe pain. - HYDROcodone-acetaminophen (NORCO/VICODIN) 5-325 MG tablet; Take 1 tablet by mouth every 6 (six) hours as needed for moderate pain.  Dispense: 30 tablet; Refill: 0  3. Peripheral edema Discussed with the patient that he may want to take his Lasix  daily for about 2 days to help get his swelling improved and he can then take it every other day or every 3 days in addition to wearing his compression hose and keeping legs elevated as he will have chronic issues with swelling in his lower legs.  He is swelling is thought to be related to venous insufficiency.   An After Visit  Summary was printed and given to the patient.   Follow-up: Return in about 4 weeks (around 03/23/2020) for leg swelling/neck pain- keep Ortho appointment tomorrow; return sooner if needed.  30 minutes of face-to-face time spent with the patient at today's visit in addition to additional 10 minutes for review of chart, labs, specialty follow-up and completion of today's note.  Cain Saupe, MD

## 2020-02-25 ENCOUNTER — Ambulatory Visit: Payer: Self-pay

## 2020-02-25 ENCOUNTER — Encounter: Payer: Self-pay | Admitting: Physician Assistant

## 2020-02-25 ENCOUNTER — Ambulatory Visit: Payer: Medicare Other | Admitting: Neurology

## 2020-02-25 ENCOUNTER — Ambulatory Visit (INDEPENDENT_AMBULATORY_CARE_PROVIDER_SITE_OTHER): Payer: Medicare Other | Admitting: Physician Assistant

## 2020-02-25 ENCOUNTER — Telehealth: Payer: Self-pay

## 2020-02-25 DIAGNOSIS — M545 Low back pain, unspecified: Secondary | ICD-10-CM

## 2020-02-25 DIAGNOSIS — G8929 Other chronic pain: Secondary | ICD-10-CM | POA: Diagnosis not present

## 2020-02-25 DIAGNOSIS — M542 Cervicalgia: Secondary | ICD-10-CM | POA: Diagnosis not present

## 2020-02-25 DIAGNOSIS — M25561 Pain in right knee: Secondary | ICD-10-CM | POA: Diagnosis not present

## 2020-02-25 NOTE — Telephone Encounter (Signed)
Pt no showed 02/25/2020 f/u appt with Dr. Frances Furbish.

## 2020-02-25 NOTE — Progress Notes (Signed)
Office Visit Note   Patient: Sean Ward           Date of Birth: [birth year]           MRN: [id number] Visit Date: 02/25/2020              Requested by: Cain Saupe, MD [address] [place],  [place] [address] PCP: Cain Saupe, MD  Chief Complaint  Patient presents with  . Right Knee - Pain  . Lower Back - Pain  . Neck - Pain      HPI: This is a pleasant gentleman who presents with continued right knee pain.  He also has a chronic history of cervical neck pain which radiates into his right shoulder and lower back pain that radiates into his right leg.  He has been seen by his primary care provider for the back issues and was given an IM injection of steroid yesterday he thinks it helped a little bit.  He denies any weakness or any significant paresthesias just pain with regards to his right knee he saw Dr. Lajoyce Corners and had some fluid removed from the knee and an injection he said this only helped him for 3 days  Assessment & Plan: Visit Diagnoses:  1. Neck pain   2. Chronic pain of right knee   3. Low back pain, unspecified back pain laterality, unspecified chronicity, unspecified whether sciatica present     Plan: I think his scoliosis in his lumbar spine is significantly increased from previous films.  Also degenerative changes and alignment changes.  I have recommended a new MRI of his lumbar spine as well as his cervical spine which has degenerative changes and loss of joint space with follow-up with Dr. Alvester Morin to see if any of these could be managed conservatively with regards to his knee since he only had a couple days relief I am not hopeful that continuing with knee injections would benefit him.  He does remember talking to Dr. Lajoyce Corners about his knee and he understands that he can get a support for the knee but with his advanced arthritic changes his best option may be knee replacement he will consider this and contact us if needed  Follow-Up Instructions: No follow-ups on  file.   Ortho Exam  Patient is alert, oriented, no adenopathy, well-dressed, normal affect, normal respiratory effort. Right knee mild effusion no cellulitis pain and crepitation with range of motion  Cervical spine pain with flexion extension no focal motor deficits could be appreciated pain radiating from neck down to her right shoulder.  Lumbar spine no focal motor deficits appreciated he does have an obvious scoliosis clinically.  Imaging: XR Cervical Spine 2 or 3 views  Result Date: 02/25/2020 2 views of the cervical spine were reviewed today advanced degenerative changes with loss of joint space..  XR KNEE 3 VIEW RIGHT  Result Date: 02/25/2020 3 views right knee were reviewed today.  Advanced arthrosis of both patellofemoral and lateral joint of the right knee.  Overall well-maintained alignment no acute osseous changes  XR Lumbar Spine 2-3 Views  Result Date: 02/25/2020 X-rays of lumbar spine today demonstrate degenerative changes increased scoliosis when compared to previous films  No images are attached to the encounter.  Labs: Lab Results  Component Value Date   ESRSEDRATE 10 12/31/2019   LABURIC 4.6 12/31/2019     Lab Results  Component Value Date   ALBUMIN 3.8 12/22/2019   ALBUMIN 3.7 08/21/2017   ALBUMIN 3.6 05/10/2016  LABURIC 4.6 12/31/2019    Lab Results  Component Value Date   MG 2.0 10/27/2014   No results found for: VD25OH  No results found for: PREALBUMIN CBC EXTENDED Latest Ref Rng & Units 12/22/2019 01/06/2018 05/10/2016  WBC 3.4 - 10.8 x10E3/uL 6.8 7.5 5.3  RBC 4.14 - 5.80 x10E6/uL 4.45 4.47 4.47  HGB 13.0 - 17.7 g/dL 14.1 13.0 14.0  HCT 37.5 - 51.0 % 40.8 39.9 41.0  PLT 150 - 450 x10E3/uL 243 278 246  NEUTROABS 1,500 - 7,800 cells/uL - - 3,922  LYMPHSABS 850 - 3,900 cells/uL - - 636(L)     There is no height or weight on file to calculate BMI.  Orders:  Orders Placed This Encounter  Procedures  . XR KNEE 3 VIEW RIGHT  . XR Cervical  Spine 2 or 3 views  . XR Lumbar Spine 2-3 Views  . MR Cervical Spine w/o contrast  . MR Lumbar Spine w/o contrast  . Ambulatory referral to Physical Medicine Rehab   No orders of the defined types were placed in this encounter.    Procedures: No procedures performed  Clinical Data: No additional findings.  ROS:  All other systems negative, except as noted in the HPI. Review of Systems  Objective: Vital Signs: There were no vitals taken for this visit.  Specialty Comments:  No specialty comments available.  PMFS History: Patient Active Problem List   Diagnosis Date Noted  . Parkinson disease ([place]) 02/28/2018  . 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 Colon cancer screening 08/16/2016  . Dyslipidemia 08/16/2016  . Unsteady gait 05/24/2016  . Atrial fibrillation ([place]) 10/27/2014  . Irregular heart beat 10/27/2014  . Need for prophylactic vaccination and inoculation against influenza 07/05/2014  . Hernia, inguinal, left 05/03/2014  . Onychomycosis 05/03/2014  . Scrotal hernia - left 04/08/2014  . Inguinal hernia, right 04/08/2014  . Tremor 04/08/2014  . Bilateral edema of lower extremity 03/25/2014  . Venous stasis 03/25/2014  . Left leg cellulitis 08/20/2012  . Superficial phlebitis 08/20/2012   Past Medical History:  Diagnosis Date  . Anticoagulant long-term use   . Back pain with radiation   . History of bronchitis   . Parkinson's disease ([place])   . 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[place])   . Scoliosis of cervicothoracic spine   . Scoliosis of thoracolumbar spine   . Spondylosis of lumbar spine     Family History  Problem Relation Age of Onset  . Deafness Mother   . Deafness Sister   . Heart disease Neg Hx     Past Surgical History:  Procedure Laterality Date  . HERNIA REPAIR    . TONSILLECTOMY     Social History   Occupational History  . Not on file  Tobacco Use  . Smoking status: Never Smoker  . Smokeless tobacco: Never Used    Substance and Sexual Activity  . Alcohol use: Yes    Alcohol/week: 7.0 standard drinks    Types: 7 Glasses of wine per week    Comment: 1 glass of red wine with dinner  . Drug use: No  . Sexual activity: Not Currently

## 2020-03-01 ENCOUNTER — Telehealth: Payer: Self-pay | Admitting: Neurology

## 2020-03-01 DIAGNOSIS — G20 Parkinson's disease: Secondary | ICD-10-CM

## 2020-03-01 DIAGNOSIS — G20C Parkinsonism, unspecified: Secondary | ICD-10-CM

## 2020-03-01 MED ORDER — CARBIDOPA-LEVODOPA 25-100 MG PO TABS: 1.5000 | ORAL_TABLET | Freq: Four times a day (QID) | ORAL | 0 refills | Status: DC

## 2020-03-01 NOTE — Telephone Encounter (Signed)
30 day supply given pending 03/09/2020 appt. Pt must keep appt to receive refill.

## 2020-03-01 NOTE — Telephone Encounter (Signed)
Pt  Has requested a refill on his carbidopa-levodopa (SINEMET IR) 25-100 MG tablet to  [facility]

## 2020-03-01 NOTE — Telephone Encounter (Signed)
LMOM informing patient that the med he requested was sent to the pharmacy  

## 2020-03-01 NOTE — Addendum Note (Signed)
Addended by: Ann Maki on: 03/01/2020 01:08 PM   Modules accepted: Orders

## 2020-03-01 NOTE — Telephone Encounter (Signed)
LMOM informing patient that the med he requested was sent to the pharmacy

## 2020-03-02 ENCOUNTER — Ambulatory Visit: Payer: Medicare Other | Admitting: Family Medicine

## 2020-03-03 ENCOUNTER — Other Ambulatory Visit: Payer: Self-pay | Admitting: Family Medicine

## 2020-03-03 DIAGNOSIS — M4722 Other spondylosis with radiculopathy, cervical region: Secondary | ICD-10-CM

## 2020-03-03 DIAGNOSIS — M542 Cervicalgia: Secondary | ICD-10-CM

## 2020-03-09 ENCOUNTER — Other Ambulatory Visit: Payer: Self-pay

## 2020-03-09 ENCOUNTER — Ambulatory Visit: Payer: Medicare Other | Admitting: Neurology

## 2020-03-09 ENCOUNTER — Encounter: Payer: Self-pay | Admitting: Neurology

## 2020-03-09 ENCOUNTER — Other Ambulatory Visit: Payer: Self-pay | Admitting: Neurology

## 2020-03-09 VITALS — BP 114/73 | HR 69 | Ht 69.0 in | Wt 177.0 lb

## 2020-03-09 DIAGNOSIS — R269 Unspecified abnormalities of gait and mobility: Secondary | ICD-10-CM | POA: Diagnosis not present

## 2020-03-09 DIAGNOSIS — Z9181 History of falling: Secondary | ICD-10-CM

## 2020-03-09 DIAGNOSIS — G20 Parkinson's disease: Secondary | ICD-10-CM | POA: Diagnosis not present

## 2020-03-09 MED ORDER — CARBIDOPA-LEVODOPA ER 50-200 MG PO TBCR
1.0000 | EXTENDED_RELEASE_TABLET | Freq: Every day | ORAL | 3 refills | Status: DC
Start: 2020-03-09 — End: 2020-08-23

## 2020-03-09 MED ORDER — CARBIDOPA-LEVODOPA 25-100 MG PO TABS: 1.5000 | ORAL_TABLET | Freq: Every day | ORAL | 3 refills | Status: DC

## 2020-03-09 MED FILL — CARBIDOPA-LEVO ER 50-200 TA: 50-200 | 30 days supply | Qty: 30 | Fill #0

## 2020-03-09 MED FILL — CARBIDOPA-LEVODOPA 25-100 T: 25-100 | 29 days supply | Qty: 225 | Fill #0

## 2020-03-09 NOTE — Progress Notes (Signed)
Subjective:    Patient ID: Sean Ward is a 79 y.o. male.  HPI     Interim history:  Sean Ward is a 79 year old right-handed gentleman with an underlying medical history of bronchitis, PAF and osteoarthritis, who presents for follow-up consultation of his parkinsonism.  The patient is unaccompanied today and presents after a longer gap of over 18 months.  He missed a virtual visit appointment on 03/12/2019 and a face to face appointment on 05/07/19. I last saw him on 08/27/2018, at which time he reported more tremor in the mornings, on the left side. He was keeping his house very cold, does not like to use the heat in the house and it has been in the 40s in the house in the mornings. He was supposed to see orthopedics for left-sided low back pain with radiation to the left thigh.  Today, 03/09/20: He reports feeling worse.  He had a fall recently but reports it was because the ground was slick from rain.  He reports that he did not injure himself.  He has had more trouble with right knee pain and has been consulting with orthopedics.  He may need knee replacement.  He also may need recurrent steroid injections.  He has had neck pain.  He is supposed to have a neck and right knee MRI soon.  He reports that his tremor is worse and his balance is worse.  He does not report any constipation.  He has not been using a walking aid.  He has not been sleeping very well.  He takes melatonin 3 mg at night.  He takes his Sinemet about every 4 hours starting at 8:30 AM.  He goes to the bed around midnight he states.  The patient's allergies, current medications, family history, past medical history, past social history, past surgical history and problem list were reviewed and updated as appropriate.    Previously (copied from previous notes for reference):   I saw him on 02/24/2018, at which time he reported doing okay, thankfully no falls. He reported occasional dizziness and lightheadedness. His blood  pressure was trending lower. He was tolerating the Eliquis, has had lower extremity swelling, also right knee pain. He was advised by cardiology to use compression socks, but was not wearing them regularly. He was planning a trip to [place]. He was advised to continue with Sinemet 1-1/2 pills 4 times a day.   (He) had to miss an appointment on 02/13/2018. I saw him on 08/15/2017, at which time he had no acute issues but overall was not as active. His balance was not as good. He felt that his legs are heavier. He was taking Sinemet one and half pill 3 times a day, I suggested we increase it to 4 times a day. His heart rate and cardiac auscultation suggested significant irregularities and I suggested he follow-up with cardiology.    Of note, he missed an appointment on 08/14/2017. I saw him on 02/12/2017, at which time he reported doing okay, had a recent trip to [place]. He was trying to stay active, felt like his right leg was weaker than the left. 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 He had seen a chiropractor for this about 3 years prior. He was taking melatonin first jet lag. He was trying hydrate well. He felt that the Sinemet was working well for him, reported no side effects, was taking 1-1/2 pills 3 times a day. I  suggested a six-month follow-up.   I saw him on 07/24/2016, at which time he reported tolerating the Sinemet and he felt that the tremor was less. He planned a trip to [place] for 6 days. He had no recent falls. He was not exercising on a regular basis. He reported a stable mood and memory. We talked about healthy lifestyle in the importance of exercise. I suggested he increase his Sinemet to 1-1/2 pills 3 times a day.     I first met him on 06/06/2016 at the request of his primary care physician, at which time he reported an upper extremity tremor on the left frontal past 6-12 months and more recently in the right upper extremity. On  examination, he had signs and keeping with parkinsonism, with left-sided predominance. I suggested we start him on Sinemet 25-100 milligrams strength half a pill twice a day with gradual titration to 1 pill 3 times a day. I ordered a brain MRI wo contrast. He had this on 06/18/16: IMPRESSION:  This MRI of the brain without contrast shows the following: 1.    Scattered T2/FLAIR hyperintense foci in the subcortical, deep and periventricular white matter in a pattern most consistent with mild chronic microvascular ischemic change.   None of the foci appears to be acute. 2.    There are no acute findings.   We called him with the results.    06/06/2016: He has had an upper extremity tremor for the past 6-12 months, unclear exactly how long and tremor started on the L, now also on the R side about a month ago. Leg tremors started about 2-3 months ago. 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 I reviewed your office note from 05/24/2016. You discontinued metoprolol and started him on propranolol long-acting 60 mg once daily. He is physically active, but not so much lately. He plays tennis and rides a bike, but not able to lately.  He was a Human resources officer, still does some contract work.  He drinks wine with dinner, 1-2 glasses. Tremor is less post alcohol. He drinks coffee about 3 cups per day, otherwise water, about 8 glasses.  He has swelling of both LEs, for the past 6+ months, was on Lasix, but no longer. He has been on a baby ASA.  He lives alone in an apartment and has to navigate the one flight of stairs. [place] once about 3 months ago, in his apartment onto his L side, no head injury, no LOC, sleeps more during the day in the afternoon, which helps. He is separated (going through divorce), has one daughter and one son, both live in [place], [place].  He is a non-smoker, no FHx of tremors, or PD. He feels like he has a tendency to fall backwards. He has only fallen once in the past year, onto his left  side, thankfully no injury as mentioned. Memory is fairly good, he denies any mood related issues, he does worry about his tremor. He is planning an overseas trip by the end of September.  His Past Medical History Is Significant For: Past Medical History:  Diagnosis Date  . Anticoagulant long-term use   . Back pain with radiation   . History of bronchitis   . Parkinson's disease ([place])   . 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[place])   . Scoliosis of cervicothoracic spine   . Scoliosis of thoracolumbar spine   . Spondylosis of lumbar spine     His Past Surgical History Is Significant For: Past Surgical  History:  Procedure Laterality Date  . HERNIA REPAIR    . TONSILLECTOMY      His Family History Is Significant For: Family History  Problem Relation Age of Onset  . Deafness Mother   . Deafness Sister   . Heart disease Neg Hx     His Social History Is Significant For: Social History   Socioeconomic History  . Marital status: Legally Separated    Spouse name: Not on file  . Number of children: 2  . Years of education: BA  . Highest education level: Not on file  Occupational History  . Not on file  Tobacco Use  . Smoking status: Never Smoker  . Smokeless tobacco: Never Used  Substance and Sexual Activity  . Alcohol use: Yes    Alcohol/week: 7.0 standard drinks    Types: 7 Glasses of wine per week    Comment: 1 glass of red wine with dinner  . Drug use: No  . Sexual activity: Not Currently  Other Topics Concern  . Not on file  Social History Narrative   Drinks 2 cups of coffee a day    Social Determinants of Health   Financial Resource Strain:   . Difficulty of Paying Living Expenses:   Food Insecurity:   . Worried About Charity fundraiser in the Last Year:   . Arboriculturist in the Last Year:   Transportation Needs:   . Film/video editor (Medical):   Marland Kitchen Lack of Transportation (Non-Medical):   Physical Activity:   . Days of Exercise per Week:   . Minutes of  Exercise per Session:   Stress:   . Feeling of Stress :   Social Connections:   . Frequency of Communication with Friends and Family:   . Frequency of Social Gatherings with Friends and Family:   . Attends Religious Services:   . Active Member of Clubs or Organizations:   . Attends Archivist Meetings:   Marland Kitchen Marital Status:     His Allergies Are:  No Known Allergies:   His Current Medications Are:  Outpatient Encounter Medications as of 03/09/2020  Medication Sig  . traMADol (ULTRAM) 50 MG tablet TAKE 1 TABLET (50 MG TOTAL) BY MOUTH EVERY 8 (EIGHT) HOURS AS NEEDED FOR UP TO 5 DAYS FOR MODERATE PAIN. (Patient not taking: Reported on 03/09/2020)  . carbidopa-levodopa (SINEMET IR) 25-100 MG tablet Take 1.5 tablets by mouth 4 (four) times daily. Take 1 1/2 pills 4 times a day, at 8 am, 12 pm and 4 pm and 8 PM. (Patient not taking: Reported on 03/09/2020)  . ELIQUIS 5 MG TABS tablet TAKE 1 TABLET (5 MG TOTAL) BY MOUTH 2 (TWO) TIMES DAILY. (Patient not taking: Reported on 03/09/2020)  . furosemide (LASIX) 20 MG tablet Take 1 tablet (20 mg total) by mouth daily as needed (swelling or shortness of breath). (Patient not taking: Reported on 03/09/2020)  . HYDROcodone-acetaminophen (NORCO/VICODIN) 5-325 MG tablet Take 1 tablet by mouth every 6 (six) hours as needed for moderate pain. (Patient not taking: Reported on 03/09/2020)  . latanoprost (XALATAN) 0.005 % ophthalmic solution Place 1 drop into both eyes at bedtime.  Marland Kitchen MELATONIN PO Take by mouth.  . Multiple Vitamin (MULTIVITAMIN) tablet Take 1 tablet by mouth daily.  . predniSONE (DELTASONE) 20 MG tablet Two pills once a day for 2 days then 1 pill daily x 2 days,  1/2 pill daily for 4 days; take after eating (Patient not taking: Reported on  03/09/2020)  . propranolol ER (INDERAL LA) 60 MG 24 hr capsule TAKE 1 CAPSULE BY MOUTH DAILY. (Patient not taking: Reported on 03/09/2020)   No facility-administered encounter medications on file as of  03/09/2020.  :  Review of Systems:  Out of a complete 14 point review of systems, all are reviewed and negative with the exception of these symptoms as listed below: Review of Systems  Neurological:       Here for f/u on PD- pt reports he has not been doing well since his last visit. He reports increase in dizziness and right knee pain arthritis pain. He also reports 1 fall since last visit.     Objective:  Neurological Exam  Physical Exam Physical Examination:   Vitals:   03/09/20 1300  BP: 114/73  Pulse: 69    General Examination: The patient is a very pleasant 79 y.o. male in no acute distress. He appears well-developed and well-nourished and well groomed.   HEENT:Normocephalic, atraumatic, pupils are equal, round and reactive to light, extraocular tracking saccadic breakdown. He has moderate facial masking, very slight left ptosis. Neck rigidity is mild to moderate. He has an intermittent lower lip and jaw tremor. He has no drooling. Hearing is grossly intact.  Speech is significantly more hypophonic and slightly dysarthric.  Chest:Clear to auscultation without wheezing, rhonchi or crackles noted.  Heart:S1+S2+0,irregularly irreg.  Abdomen:Soft, non-tender and non-distended with normal bowel sounds appreciated on auscultation.  Extremities:There istrace to 1+edema in thedistal lower extremities, left more than right.  Skin: Warm and dry without trophic changes noted.  Musculoskeletal: exam reveals right knee discomfort and he wears a brace around it.  He has a significant upper body tilt to the right and neck tilt to the right while sitting and standing.    Neurologically: Mental status: The patient is awake, alert and oriented in all 4 spheres. Hisimmediate and remote memory, attention, language skills and fund of knowledge are appropriate. There is no evidence of aphasia, agnosia, apraxia or anomia. Speech is mildly hypophonic, no dysarthria. Thought  process is linear. Mood is normaland affect is blunted.  Cranial nerves II - XII are as described above under HEENT exam.  Motor exam: Normal bulk, and strength for age, tone is mildly increased. He hasa mild to moderate resting tremor in the right lower extremity and left upper extremity.  Fine motor skills are moderately impaired on the left, slightly better on the right.  He stands up with difficulty and posture is moderately stooped, significant tilt to the right, also mildly bent knee on the right.  He walks with difficulty, no walking aid.  He has a significant limp on the right and insecurity with turns.   Balance is significantly worse compared to last exam.  Assessment and Plan:  In summary, Kollyn Lingafelter a very pleasant 79 year old male with an underlying medical history of bronchitis, a fib, osteoarthritis, and mildly overweight state, who presents for follow-up consultation of his left-sided predominant Parkinson's disease after a longer gap of over 18 months.  He reports feeling worse.  He has had decline in his mobility, more pain and issues with the right knee, may need a knee replacement.  He may also need recurrent steroid injections.  He has recently had a fall but reports no injury from it.  He has had decline in his posture and increase in tremor.  His balance is worse.  He is encouraged to start using a rolling walker for gait safety and stability but  he declines a prescription for a walker today.  I do not believe he will be well served with a cane.  If he uses the cane on the left side, his upper body tilt will not allow him to use a cane well on the left side and if he uses the cane on the right side is tilted we will probably get exacerbated.  He is willing to think about the walker and he is encouraged to call our office if he desires a prescription for this.  I would like for him to increase his Sinemet to 1-1/2 pills 5 times a day, on a 3 hourly schedule starting at age or  around 54.  Furthermore, I would like for him to add a Sinemet CR at bedtime or at night, around 10 PM.  He does have trouble sleeping and uses melatonin 3 mg at night.  He is encouraged to increase it to 5 mg and may even go up to 10 mg if needed.  He is reminded to stay well-hydrated and well rested and we talked about fall prevention.  He is advised to stay proactive about constipation issues.  He had a brain MRI without contrast on 8/28/17which showed chronic white matter changes, no acute changes, no significant atrophy for age. I suggested a 3 month follow-up, sooner as needed. I answered all his questions today andHe was in agreement.  I spent 30 minutes in total face-to-face time and in reviewing records during pre-charting, more than 50% of which was spent in counseling and coordination of care, reviewing test results, reviewing medications and treatment regimen and/or in discussing or reviewing the diagnosis of PD, the prognosis and treatment options. Pertinent laboratory and imaging test results that were available during this visit with the patient were reviewed by me and considered in my medical decision making (see chart for details).

## 2020-03-09 NOTE — Patient Instructions (Addendum)
I do believe your mobility issues and tremors have progressed and your posture is worse as well, you are at higher risk for falls and I would like for you to start using a walker, I can prescribe a rolling walker with a seat, please consider it and let me know if you would like a prescription.  As discussed, we will increase her Sinemet immediate release to 1-1/2 pills 5 times a day, namely at or around 8 AM, 11 AM, 2 PM, 5 PM, and 8 PM.  We will also add a long-acting Sinemet, called Sinemet CR 50-200 mg strength 1 pill at night, take around 10 PM.  For sleep issues you can continue to take melatonin, I recommend that you increase from 3 mg to 5 mg about 2 hours before bedtime, around 10 PM.  You may increase up to 10 mg but for now, increase it to 5 mg strength.  Please be proactive about constipation issues and stay well-hydrated with water, well rested.  Please follow-up routinely in 3 months.

## 2020-03-16 ENCOUNTER — Ambulatory Visit: Payer: Medicare Other | Admitting: Family Medicine

## 2020-03-23 ENCOUNTER — Ambulatory Visit: Payer: Medicare Other | Admitting: Family Medicine

## 2020-03-29 ENCOUNTER — Other Ambulatory Visit: Payer: Medicare Other

## 2020-03-29 ENCOUNTER — Telehealth: Payer: Self-pay | Admitting: Interventional Cardiology

## 2020-03-29 ENCOUNTER — Telehealth: Payer: Self-pay | Admitting: Orthopedic Surgery

## 2020-03-29 NOTE — Telephone Encounter (Signed)
His MRI is on his spine. We can have him come in if his knee is hurting more. We could try 1 more knee injection

## 2020-03-29 NOTE — Telephone Encounter (Signed)
Called and spoke to the patient. He states that he is planning to have fasting cholesterol with his PCP next month and will have them fax [place] the results.   Patient is scheduled for his covid vaccine on 6/21. He is asking if there are any contraindications. Made patient aware that from a cardiac perspective there are no contraindications and we have been recommending that all of our patients get the vaccine. He verbalized understanding and thanked me for the call.

## 2020-03-29 NOTE — Telephone Encounter (Signed)
You saw this pt most recently in the office please message below and advise.

## 2020-03-29 NOTE — Telephone Encounter (Signed)
New Message  Pt called and wanted to talk with nurse about lab work he was supposed to have done and also about the covid-19 vaccine. Please call to discuss

## 2020-03-29 NOTE — Telephone Encounter (Signed)
Patient called stating that he is having a lot of pain on his knee, but will not have the MRI for a couple of weeks, he wanted to know what he can do for the pain.  CB#[phone].  Thank you.

## 2020-03-30 ENCOUNTER — Telehealth: Payer: Self-pay

## 2020-03-30 NOTE — Telephone Encounter (Signed)
Called pt and appt made for Monday 04/04/20 at 1:30 per his request.

## 2020-03-30 NOTE — Telephone Encounter (Signed)
Pt states needs to come for cholesterol labs. PCP is out of office. Please advise.

## 2020-03-30 NOTE — Telephone Encounter (Signed)
Patient has a future order for lipid panel in March 2021. Looks like it was placed by his cardiologist. Bonita Quin can go ahead and schedule for fasting lab and release this order.    Marcy Siren, D.O. Primary Care at [facility]  03/30/2020, 2:34 PM

## 2020-04-04 ENCOUNTER — Other Ambulatory Visit: Payer: Medicare Other

## 2020-04-04 ENCOUNTER — Ambulatory Visit: Payer: Medicare Other | Admitting: Physician Assistant

## 2020-04-05 ENCOUNTER — Other Ambulatory Visit: Payer: Self-pay

## 2020-04-05 ENCOUNTER — Encounter: Payer: Self-pay | Admitting: Physician Assistant

## 2020-04-05 ENCOUNTER — Ambulatory Visit (INDEPENDENT_AMBULATORY_CARE_PROVIDER_SITE_OTHER): Payer: Medicare Other | Admitting: Orthopedic Surgery

## 2020-04-05 VITALS — Ht 69.0 in | Wt 177.0 lb

## 2020-04-05 DIAGNOSIS — M1711 Unilateral primary osteoarthritis, right knee: Secondary | ICD-10-CM | POA: Diagnosis not present

## 2020-04-05 MED ORDER — METHYLPREDNISOLONE ACETATE 40 MG/ML IJ SUSP
40.0000 mg | INTRAMUSCULAR | Status: AC | PRN
  Administered 2020-04-05: 40 mg via INTRA_ARTICULAR

## 2020-04-05 MED ORDER — LIDOCAINE HCL (PF) 1 % IJ SOLN
5.0000 mL | INTRAMUSCULAR | Status: AC | PRN
  Administered 2020-04-05: 5 mL

## 2020-04-05 NOTE — Progress Notes (Signed)
Office Visit Note   Patient: Sean Ward           Date of Birth: 1941-06-19           MRN: [id number] Visit Date: 04/05/2020              Requested by: Cain Saupe, MD [address] [place],  [place] [address] PCP: Cain Saupe, MD  Chief Complaint  Patient presents with   Right Knee - Pain      HPI: Patient is a 79 year old gentleman who presents in follow-up for osteoarthritis of his right knee.  He did have an injection at his last visit about 6 weeks ago but he is unsure how long the injection lasted.  He is currently wearing a soft elastic knee brace that he states helps.  Patient is scheduled for an MRI scan of his right knee next week.  Assessment & Plan: Visit Diagnoses:  1. Unilateral primary osteoarthritis, right knee     Plan: Will reevaluate in 3 weeks to see how long relief he gets with the injection and review the MRI scan.  Discussed that with patient's radiographs a total knee replacement is an option and we should be able to tell from the MRI scan if arthroscopic debridement of his knee would be beneficial.  Follow-Up Instructions: Return in about 3 weeks (around 04/26/2020).   Ortho Exam  Patient is alert, oriented, no adenopathy, well-dressed, normal affect, normal respiratory effort. Examination patient has an antalgic gait there is no redness no cellulitis collaterals and cruciates are stable around the right knee there is crepitation in the patellofemoral joint with range of motion and is tender to palpation of the medial and lateral facets of the patella.  Medial lateral joint line are tender to palpation.  Imaging: No results found. No images are attached to the encounter.  Labs: Lab Results  Component Value Date   ESRSEDRATE 10 12/31/2019   LABURIC 4.6 12/31/2019     Lab Results  Component Value Date   ALBUMIN 3.8 12/22/2019   ALBUMIN 3.7 08/21/2017   ALBUMIN 3.6 05/10/2016   LABURIC 4.6 12/31/2019    Lab Results  Component  Value Date   MG 2.0 10/27/2014   No results found for: VD25OH  No results found for: PREALBUMIN CBC EXTENDED Latest Ref Rng & Units 12/22/2019 01/06/2018 05/10/2016  WBC 3.4 - 10.8 x10E3/uL 6.8 7.5 5.3  RBC 4.14 - 5.80 x10E6/uL 4.45 4.47 4.47  HGB 13.0 - 17.7 g/dL 36.1 44.3 15.4  HCT 00.8 - 51.0 % 40.8 39.9 41.0  PLT 150 - 450 x10E3/uL 243 278 246  NEUTROABS 1,500 - 7,800 cells/uL - - 3,922  LYMPHSABS 850 - 3,900 cells/uL - - 636(L)     Body mass index is 26.14 kg/m.  Orders:  No orders of the defined types were placed in this encounter.  No orders of the defined types were placed in this encounter.    Procedures: Large Joint Inj: R knee on 04/05/2020 3:55 PM Indications: pain and diagnostic evaluation Details: 22 G 1.5 in needle, anteromedial approach  Arthrogram: No  Medications: 5 mL lidocaine (PF) 1 %; 40 mg methylPREDNISolone acetate 40 MG/ML Outcome: tolerated well, no immediate complications Procedure, treatment alternatives, risks and benefits explained, specific risks discussed. Consent was given by the patient. Immediately prior to procedure a time out was called to verify the correct patient, procedure, equipment, support staff and site/side marked as required. Patient was prepped and draped in the usual sterile  fashion.      Clinical Data: No additional findings.  ROS:  All other systems negative, except as noted in the HPI. Review of Systems  Objective: Vital Signs: Ht 5\' 9"  (1.753 m)    Wt 177 lb (80.3 kg)    BMI 26.14 kg/m   Specialty Comments:  No specialty comments available.  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[place]) 02/28/2018   Primary osteoarthritis of right knee 08/16/2016   Colon cancer screening 08/16/2016   Dyslipidemia 08/16/2016   Unsteady gait 05/24/2016   Atrial fibrillation ([place]) 10/27/2014   Irregular heart beat 10/27/2014   Need for prophylactic vaccination and  inoculation against influenza 07/05/2014   Hernia, inguinal, left 05/03/2014   Onychomycosis 05/03/2014   Scrotal hernia - left 04/08/2014   Inguinal hernia, right 04/08/2014   Tremor 04/08/2014   Bilateral edema of lower extremity 03/25/2014   Venous stasis 03/25/2014   Left leg cellulitis 08/20/2012   Superficial phlebitis 08/20/2012   Past Medical History:  Diagnosis Date   Anticoagulant long-term use    Back pain with radiation    History of bronchitis    Parkinson's disease ([place])    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[facility])    Scoliosis of cervicothoracic spine    Scoliosis of thoracolumbar spine    Spondylosis of lumbar spine     Family History  Problem Relation Age of Onset   Deafness Mother    Deafness Sister    Heart disease Neg Hx     Past Surgical History:  Procedure Laterality Date   HERNIA REPAIR     TONSILLECTOMY     Social History   Occupational History   Not on file  Tobacco Use   Smoking status: Never Smoker   Smokeless tobacco: Never Used  Vaping Use   Vaping Use: Never used  Substance and Sexual Activity   Alcohol use: Yes    Alcohol/week: 7.0 standard drinks    Types: 7 Glasses of wine per week    Comment: 1 glass of red wine with dinner   Drug use: No   Sexual activity: Not Currently

## 2020-04-08 ENCOUNTER — Other Ambulatory Visit: Payer: Self-pay | Admitting: Family Medicine

## 2020-04-08 DIAGNOSIS — M4722 Other spondylosis with radiculopathy, cervical region: Secondary | ICD-10-CM

## 2020-04-08 DIAGNOSIS — M542 Cervicalgia: Secondary | ICD-10-CM

## 2020-04-08 MED FILL — CARBIDOPA-LEVO ER 50-200 TA: 50-200 | 30 days supply | Qty: 30 | Fill #1

## 2020-04-11 ENCOUNTER — Ambulatory Visit: Payer: Medicare Other

## 2020-04-14 ENCOUNTER — Other Ambulatory Visit: Payer: Self-pay

## 2020-04-14 ENCOUNTER — Ambulatory Visit: Payer: Medicare Other | Attending: Family Medicine

## 2020-04-14 NOTE — Progress Notes (Unsigned)
This encounter was created in error - please disregard.

## 2020-04-18 ENCOUNTER — Ambulatory Visit: Payer: Medicare Other | Attending: Internal Medicine

## 2020-04-18 DIAGNOSIS — Z23 Encounter for immunization: Secondary | ICD-10-CM

## 2020-04-18 NOTE — Progress Notes (Signed)
   Covid-19 Vaccination Clinic  Name:  Hooper Petteway    MRN: [id number] DOB: 12/26/1940  04/18/2020  Mr. Pasqual was observed post Covid-19 immunization for 15 minutes without incident. He was provided with Vaccine Information Sheet and instruction to access the V-Safe system.   Mr. Kortz was instructed to call 911 with any severe reactions post vaccine: Marland Kitchen Difficulty breathing  . Swelling of face and throat  . A fast heartbeat  . A bad rash all over body  . Dizziness and weakness   Immunizations Administered    Name Date Dose VIS Date Route   Pfizer COVID-19 Vaccine 04/18/2020 10:33 AM 0.3 mL 12/16/2018 Intramuscular   Manufacturer: [employer], [employer]   Lot: [id number]   NDC: [address]-1

## 2020-04-19 ENCOUNTER — Ambulatory Visit: Payer: Medicare Other | Admitting: Physician Assistant

## 2020-04-22 ENCOUNTER — Ambulatory Visit: Payer: Medicare Other | Admitting: Family Medicine

## 2020-04-26 MED FILL — PROPRANOLOL ER 60 MG CAP: 60 | 30 days supply | Qty: 30 | Fill #6

## 2020-04-29 MED FILL — CARBIDOPA-LEVODOPA 25-100 T: 25-100 | 29 days supply | Qty: 225 | Fill #1

## 2020-05-02 ENCOUNTER — Ambulatory Visit: Payer: Medicare Other | Attending: Internal Medicine | Admitting: Internal Medicine

## 2020-05-02 ENCOUNTER — Other Ambulatory Visit: Payer: Self-pay

## 2020-05-03 ENCOUNTER — Other Ambulatory Visit: Payer: Medicare Other

## 2020-05-09 ENCOUNTER — Ambulatory Visit: Payer: Medicare Other | Attending: Internal Medicine

## 2020-05-09 DIAGNOSIS — Z23 Encounter for immunization: Secondary | ICD-10-CM

## 2020-05-09 NOTE — Progress Notes (Signed)
   Covid-19 Vaccination Clinic  Name:  Sean Ward    MRN: [id number] DOB: [birth year]  05/09/2020  Sean Ward was observed post Covid-19 immunization for 15 minutes without incident. He was provided with Vaccine Information Sheet and instruction to access the V-Safe system.   Sean Ward was instructed to call 911 with any severe reactions post vaccine: Marland Kitchen Difficulty breathing  . Swelling of face and throat  . A fast heartbeat  . A bad rash all over body  . Dizziness and weakness   Immunizations Administered    Name Date Dose VIS Date Route   Pfizer COVID-19 Vaccine 05/09/2020 11:11 AM 0.3 mL 12/16/2018 Intramuscular   Manufacturer: [employer], [employer]   Lot: [id number]   NDC: [address]-2

## 2020-05-13 MED FILL — CARBIDOPA-LEVO ER 50-200 TA: 50-200 | 30 days supply | Qty: 30 | Fill #2

## 2020-05-16 ENCOUNTER — Ambulatory Visit: Payer: Medicare Other

## 2020-05-27 MED FILL — PROPRANOLOL ER 60 MG CAP: 60 | 30 days supply | Qty: 30 | Fill #7

## 2020-05-31 ENCOUNTER — Inpatient Hospital Stay: Admission: RE | Admit: 2020-05-31 | Payer: Medicare Other | Source: Ambulatory Visit

## 2020-05-31 ENCOUNTER — Other Ambulatory Visit: Payer: Medicare Other

## 2020-06-01 ENCOUNTER — Ambulatory Visit: Payer: Medicare Other | Attending: Physician Assistant | Admitting: Physician Assistant

## 2020-06-01 ENCOUNTER — Other Ambulatory Visit: Payer: Self-pay

## 2020-06-01 ENCOUNTER — Encounter: Payer: Self-pay | Admitting: Physician Assistant

## 2020-06-01 VITALS — BP 111/71 | HR 69 | Temp 97.9°F | Resp 17 | Wt 167.0 lb

## 2020-06-01 DIAGNOSIS — Z13220 Encounter for screening for lipoid disorders: Secondary | ICD-10-CM | POA: Diagnosis not present

## 2020-06-01 DIAGNOSIS — K089 Disorder of teeth and supporting structures, unspecified: Secondary | ICD-10-CM

## 2020-06-01 DIAGNOSIS — H539 Unspecified visual disturbance: Secondary | ICD-10-CM

## 2020-06-01 DIAGNOSIS — Z7189 Other specified counseling: Secondary | ICD-10-CM | POA: Diagnosis not present

## 2020-06-01 DIAGNOSIS — M545 Low back pain, unspecified: Secondary | ICD-10-CM

## 2020-06-01 MED ORDER — METHOCARBAMOL 500 MG PO TABS: 500.0000 mg | ORAL_TABLET | Freq: Three times a day (TID) | ORAL | 0 refills | Status: DC | PRN

## 2020-06-01 MED ORDER — KETOROLAC TROMETHAMINE 30 MG/ML IJ SOLN
30.0000 mg | Freq: Once | INTRAMUSCULAR | Status: AC
  Administered 2020-06-01: 30 mg via INTRAMUSCULAR

## 2020-06-01 MED FILL — METHOCARBAMOL 500 MG TABS: 500 | 20 days supply | Qty: 60 | Fill #0

## 2020-06-01 MED FILL — CARBIDOPA-LEVODOPA 25-100 T: 25-100 | 30 days supply | Qty: 225 | Fill #2

## 2020-06-01 NOTE — Progress Notes (Signed)
Demitris Pokorny, is a 79 y.o. male  TKZ:[id number]  TDD:[id number]  DOB - [birth year]  Subjective:  Chief Complaint and HPI: Sean Ward is a 79 y.o. male here today with multiple general issues.  He is seeing Dr Lajoyce Corners for back and knee problems.  He was supposed to have MRI yesterday and missed appt due to pain.  Wants to know if chiropractor would help him.  Wants to try something for the discomfort.    Also, hasn't seen his gf in almost 1 year due to covid.  She had covid in 08/2019 and they have both now been vaccinated.  He wants to know if they can visit each other.  He needs to get his cholesterol checked.  He is not fasting but has only had cereal, yogurt and fruit today.      Wants referral to dentist and ophthalmology.     ROS:   Constitutional:  No f/c, No night sweats, No unexplained weight loss. EENT:  No vision changes, No blurry vision, No hearing changes. No mouth, throat, or ear problems.  Respiratory: No cough, No SOB Cardiac: No CP, no palpitations GI:  No abd pain, No N/V/D. GU: No Urinary s/sx Musculoskeletal: back and R knee pain Neuro: No headache, no dizziness, no motor weakness.  Skin: No rash Endocrine:  No polydipsia. No polyuria.  Psych: Denies SI/HI  No problems updated.  ALLERGIES: No Known Allergies  PAST MEDICAL HISTORY: Past Medical History:  Diagnosis Date  . Anticoagulant long-term use   . Back pain with radiation   . History of bronchitis   . Parkinson's disease ([facility])   . 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 Scoliosis of cervicothoracic spine   . Scoliosis of thoracolumbar spine   . Spondylosis of lumbar spine     MEDICATIONS AT HOME: Prior to Admission medications   Medication Sig Start Date End Date Taking? Authorizing Provider  carbidopa-levodopa (SINEMET CR) 50-200 MG tablet Take 1 tablet by mouth at bedtime. Take at [facility] 03/09/20   Huston Foley, MD  carbidopa-levodopa (SINEMET IR) 25-100 MG tablet Take 1.5 tablets by  mouth 5 (five) times daily. Take 1 1/2 pills 5 times a day, at 8 am, 11 am, 2 PM, 5 pm and 8 PM. 03/09/20   Athar, Ladell Heads, MD  ELIQUIS 5 MG TABS tablet TAKE 1 TABLET (5 MG TOTAL) BY MOUTH 2 (TWO) TIMES DAILY. 12/15/19   Corky Crafts, MD  furosemide (LASIX) 20 MG tablet Take 1 tablet (20 mg total) by mouth daily as needed (swelling or shortness of breath). 12/29/19   Corky Crafts, MD  latanoprost (XALATAN) 0.005 % ophthalmic solution Place 1 drop into both eyes at bedtime.    [provider]  methocarbamol (ROBAXIN) 500 MG tablet Take 1 tablet (500 mg total) by mouth every 8 (eight) hours as needed for muscle spasms. 06/01/20   Anders Simmonds, PA-C  Multiple Vitamin (MULTIVITAMIN) tablet Take 1 tablet by mouth daily.    [provider]  propranolol ER (INDERAL LA) 60 MG 24 hr capsule TAKE 1 CAPSULE BY MOUTH DAILY. 10/26/19   Corky Crafts, MD     Objective:  EXAM:   Vitals:   06/01/20 1455  BP: 111/71  Pulse: 69  Resp: 17  Temp: 97.9 F (36.6 C)  TempSrc: Temporal  SpO2: 95%  Weight: 167 lb (75.8 kg)    General appearance : A&OX3. NAD. Non-toxic-appearing HEENT: Atraumatic and Normocephalic.  PERRLA. EOM intact.   Chest/Lungs:  Breathing-non-labored, Good air entry bilaterally, breath sounds normal without rales, rhonchi, or wheezing  CVS: S1 S2 regular, no murmurs, gallops, rubs  Neurology:  CN II-XII grossly intact, Non focal.   Psych:  TP linear. J/I WNL. Normal speech. Appropriate eye contact and affect.  Skin:  No Rash  Data Review No results found for: HGBA1C   Assessment & Plan   1. Low back pain, unspecified back pain laterality, unspecified chronicity, unspecified whether sciatica present Call Dr Lajoyce Corners for pain management and can try low dose robaxin with tylenol - ketorolac (TORADOL) 30 MG/ML injection 30 mg - methocarbamol (ROBAXIN) 500 MG tablet; Take 1 tablet (500 mg total) by mouth every 8 (eight) hours as needed for muscle  spasms.  Dispense: 60 tablet; Refill: 0  2. Vision changes - Ambulatory referral to Ophthalmology  3. Poor dentition - Ambulatory referral to Dentistry  4. Screening cholesterol level - Lipid panel  5. covid counselling-general and sepcific to new variants with Delta coming out.  Extra precautions necessary and consult CDC guidelines.    Spent >45 mins with patient on all the above.    Patient have been counseled extensively about nutrition and exercise  Return in about 3 months (around 09/01/2020) for PCP;  chronic conditions.  The patient was given clear instructions to go to ER or return to medical center if symptoms don't improve, worsen or new problems develop. The patient verbalized understanding. The patient was told to call to get lab results if they haven't heard anything in the next week.     Georgian Co, PA-C [facility] [place], [place] [phone]   06/01/2020, 3:14 PMPatient ID: Keelen Quevedo, male   DOB: Feb 19, 1941, 79 y.o.   MRN: [id number]

## 2020-06-02 LAB — LIPID PANEL
Chol/HDL Ratio: 3.2 ratio (ref 0.0–5.0)
Cholesterol, Total: 165 mg/dL (ref 100–199)
HDL: 51 mg/dL (ref >39)
LDL Chol Calc (NIH): 97 mg/dL (ref 0–99)
Triglycerides: 91 mg/dL (ref 0–149)
VLDL Cholesterol Cal: 17 mg/dL (ref 5–40)

## 2020-06-07 ENCOUNTER — Encounter: Payer: Self-pay | Admitting: *Deleted

## 2020-06-07 ENCOUNTER — Telehealth: Payer: Self-pay | Admitting: Interventional Cardiology

## 2020-06-07 ENCOUNTER — Telehealth: Payer: Self-pay | Admitting: Physician Assistant

## 2020-06-07 NOTE — Telephone Encounter (Signed)
Patient called asked if he can [place] a stationary bike to exercise? The number to contact patient is [phone]

## 2020-06-07 NOTE — Telephone Encounter (Signed)
Lipids well controlled.   JV

## 2020-06-07 NOTE — Telephone Encounter (Signed)
Called and spoke to patient. He states that he had LIPIDS checked with PCP and would like Dr. Eldridge Dace to review. These results are available in Epic. Made patient aware that I will send a note to Dr. Eldridge Dace to let him know.

## 2020-06-07 NOTE — Telephone Encounter (Signed)
Patient calling stating he had labs done at his PCP and would like to discuss the results.

## 2020-06-08 NOTE — Telephone Encounter (Signed)
Called and made patient aware. 

## 2020-06-08 NOTE — Telephone Encounter (Signed)
I called pt to advise ok to use stationary bike for exercise. Pt will call with any other questions.

## 2020-06-13 MED FILL — CARBIDOPA-LEVO ER 50-200 TA: 50-200 | 30 days supply | Qty: 30 | Fill #3

## 2020-06-15 ENCOUNTER — Ambulatory Visit: Payer: Medicare Other | Admitting: Neurology

## 2020-06-15 ENCOUNTER — Encounter: Payer: Self-pay | Admitting: Neurology

## 2020-06-15 VITALS — BP 116/82 | HR 66 | Ht 66.0 in | Wt 166.0 lb

## 2020-06-15 DIAGNOSIS — G20 Parkinson's disease: Secondary | ICD-10-CM

## 2020-06-15 DIAGNOSIS — R269 Unspecified abnormalities of gait and mobility: Secondary | ICD-10-CM | POA: Diagnosis not present

## 2020-06-15 NOTE — Progress Notes (Signed)
Subjective:    Ward ID: Sean Ward is a 79 y.o. male.  HPI     Interim history:   Sean Ward is a 79 year old right-handed Sean Ward with an underlying medical history of bronchitis, PAF and osteoarthritis, who presents for follow-up consultation of his parkinsonism. Sean Ward is unaccompanied today.  I last saw him on 03/09/2020 after a longer gap of over 1-1/2 years.  Sean Ward had a recent fall.  Sean Ward reported feeling worse.  His exam showed progression.  Sean Ward was advised to increase his Sinemet to 1-1/2 pills 5 times a day and also add a Sinemet CR at bedtime.  Furthermore, Sean Ward was advised to increase his melatonin to 5 mg at night. Sean Ward was advised to start using a cane consistently and consider using a walker.   Today, 06/15/2020: Sean Ward reports feeling fairly stable when it comes to his Parkinson's.  Sean Ward is currently reading a book about Parkinson's disease.  Sean Ward has had more trouble with his right knee, severe pain and may need knee replacement but is reluctant to pursue this.  Sean Ward has been receiving cortisone injections.  Sean Ward has seen Dr. Sharol Given for this.  Sean Ward has not been using a cane.  Sean Ward uses a soft brace around his knee.  Sean Ward has increased his Sinemet to 1-1/2 pills 5 times a day and takes this ER at bedtime.  Sean Ward believes it is working well.  Sean Ward denies any constipation.  Sean Ward tries to hydrate well but admits that Sean Ward may do better with water, likes to drink coffee.  Sean Ward is considering moving to [place].  Sean Ward may try to stay 6 months in [place] in 6 months here, has not decided yet.    Sean Ward's allergies, current medications, family history, past medical history, past social history, past surgical history and problem list were reviewed and updated as appropriate.    Previously (copied from previous notes for reference):   Sean Ward missed a virtual visit appointment on 03/12/2019 and a face to face appointment on 05/07/19. I saw him on 08/27/2018, at which time Sean Ward reported more tremor in Sean mornings, on Sean left  side. Sean Ward was keeping his house very cold, does not like to use Sean heat in Sean house and it has been in Sean 40s in Sean house in Sean mornings. Sean Ward was supposed to see orthopedics for left-sided low back pain with radiation to Sean left thigh.       I saw him on 02/24/2018, at which time Sean Ward reported doing okay, thankfully no falls. Sean Ward reported occasional dizziness and lightheadedness. His blood pressure was trending lower. Sean Ward was tolerating Sean Eliquis, has had lower extremity swelling, also right knee pain. Sean Ward was advised by cardiology to use compression socks, but was not wearing them regularly. Sean Ward was planning a trip to [place]. Sean Ward was advised to continue with Sinemet 1-1/2 pills 4 times a day.   (Sean Ward) had to miss an appointment on 02/13/2018. I saw him on 08/15/2017, at which time Sean Ward had no acute issues but overall was not as active. His balance was not as good. Sean Ward felt that his legs are heavier. Sean Ward was taking Sinemet one and half pill 3 times a day, I suggested we increase it to 4 times a day. His heart rate and cardiac auscultation suggested significant irregularities and I suggested Sean Ward follow-up with cardiology.    Of note, Sean Ward missed an appointment on 08/14/2017. I saw him on 02/12/2017, at which time Sean Ward reported doing okay, had a  recent trip to [place]. Sean Ward was trying to stay active, felt like his right leg was weaker than Sean left. 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 Sean Ward had seen a chiropractor for this about 3 years prior. Sean Ward was taking melatonin first jet lag. Sean Ward was trying hydrate well. Sean Ward felt that Sean Sinemet was working well for him, reported no side effects, was taking 1-1/2 pills 3 times a day. I suggested a six-month follow-up.   I saw him on 07/24/2016, at which time Sean Ward reported tolerating Sean Sinemet and Sean Ward felt that Sean tremor was less. Sean Ward planned a trip to [place] for 6 days. Sean Ward had no recent falls. Sean Ward was not exercising on a regular  basis. Sean Ward reported a stable mood and memory. We talked about healthy lifestyle in Sean importance of exercise. I suggested Sean Ward increase his Sinemet to 1-1/2 pills 3 times a day.     I first met him on 06/06/2016 at Sean request of his primary care physician, at which time Sean Ward reported an upper extremity tremor on Sean left frontal past 6-12 months and more recently in Sean right upper extremity. On examination, Sean Ward had signs and keeping with parkinsonism, with left-sided predominance. I suggested we start him on Sinemet 25-100 milligrams strength half a pill twice a day with gradual titration to 1 pill 3 times a day. I ordered a brain MRI wo contrast. Sean Ward had this on 06/18/16: IMPRESSION:  This MRI of Sean brain without contrast shows Sean following: 1.    Scattered T2/FLAIR hyperintense foci in Sean subcortical, deep and periventricular white matter in a pattern most consistent with mild chronic microvascular ischemic change.   None of Sean foci appears to be acute. 2.    There are no acute findings.   We called him with Sean results.    06/06/2016: Sean Ward has had an upper extremity tremor for Sean past 6-12 months, unclear exactly how long and tremor started on Sean L, now also on Sean R side about a month ago. Leg tremors started about 2-3 months ago. 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 I reviewed your office note from 05/24/2016. You discontinued metoprolol and started him on propranolol long-acting 60 mg once daily. Sean Ward is physically active, but not so much lately. Sean Ward plays tennis and rides a bike, but not able to lately.  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  Sean Ward drinks wine with dinner, 1-2 glasses. Tremor is less post alcohol. Sean Ward drinks coffee about 3 cups per day, otherwise water, about 8 glasses.  Sean Ward has swelling of both LEs, for Sean past 6+ months, was on Lasix, but no longer. Sean Ward has been on a baby ASA.  Sean Ward lives alone in an apartment and has to navigate Sean one flight of  stairs. [place] once about 3 months ago, in his apartment onto his L side, no head injury, no LOC, sleeps more during Sean day in Sean afternoon, which helps. Sean Ward is separated (going through divorce), has one daughter and one son, both live in [place], [place].  Sean Ward is a non-smoker, no FHx of tremors, or PD. Sean Ward feels like Sean Ward has a tendency to fall backwards. Sean Ward has only fallen once in Sean past year, onto his left side, thankfully no injury as mentioned. Memory is fairly good, Sean Ward denies any mood related issues, Sean Ward does worry about his tremor. Sean Ward is planning an overseas trip by  Sean end of September.  His Past Medical History Is Significant For: Past Medical History:  Diagnosis Date  . Anticoagulant long-term use   . Back pain with radiation   . History of bronchitis   . Parkinson's disease ([facility])   . 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[facility])   . Scoliosis of cervicothoracic spine   . Scoliosis of thoracolumbar spine   . Spondylosis of lumbar spine     His Past Surgical History Is Significant For: Past Surgical History:  Procedure Laterality Date  . HERNIA REPAIR    . TONSILLECTOMY      His Family History Is Significant For: Family History  Problem Relation Age of Onset  . Deafness Mother   . Deafness Sister   . Heart disease Neg Hx     His Social History Is Significant For: Social History   Socioeconomic History  . Marital status: Legally Separated    Spouse name: Not on file  . Number of children: 2  . Years of education: BA  . Highest education level: Not on file  Occupational History  . Not on file  Tobacco Use  . Smoking status: Never Smoker  . Smokeless tobacco: Never Used  Vaping Use  . Vaping Use: Never used  Substance and Sexual Activity  . Alcohol use: Yes    Alcohol/week: 7.0 standard drinks    Types: 7 Glasses of wine per week    Comment: 1 glass of red wine with dinner  . Drug use: No  . Sexual activity: Not Currently  Other Topics Concern  . Not on file  Social History  Narrative   Drinks 2 cups of coffee a day    Social Determinants of Health   Financial Resource Strain:   . Difficulty of Paying Living Expenses: Not on file  Food Insecurity:   . Worried About Charity fundraiser in Sean Last Year: Not on file  . Ran Out of Food in Sean Last Year: Not on file  Transportation Needs:   . Lack of Transportation (Medical): Not on file  . Lack of Transportation (Non-Medical): Not on file  Physical Activity:   . Days of Exercise per Week: Not on file  . Minutes of Exercise per Session: Not on file  Stress:   . Feeling of Stress : Not on file  Social Connections:   . Frequency of Communication with Friends and Family: Not on file  . Frequency of Social Gatherings with Friends and Family: Not on file  . Attends Religious Services: Not on file  . Active Member of Clubs or Organizations: Not on file  . Attends Archivist Meetings: Not on file  . Marital Status: Not on file    His Allergies Are:  No Known Allergies:   His Current Medications Are:  Outpatient Encounter Medications as of 06/15/2020  Medication Sig  . carbidopa-levodopa (SINEMET CR) 50-200 MG tablet Take 1 tablet by mouth at bedtime. Take at 10PM  . carbidopa-levodopa (SINEMET IR) 25-100 MG tablet Take 1.5 tablets by mouth 5 (five) times daily. Take 1 1/2 pills 5 times a day, at 8 am, 11 am, 2 PM, 5 pm and 8 PM.  . ELIQUIS 5 MG TABS tablet TAKE 1 TABLET (5 MG TOTAL) BY MOUTH 2 (TWO) TIMES DAILY.  . furosemide (LASIX) 20 MG tablet Take 1 tablet (20 mg total) by mouth daily as needed (swelling or shortness of breath).  . latanoprost (XALATAN) 0.005 % ophthalmic solution Place 1 drop into both  eyes at bedtime.  . methocarbamol (ROBAXIN) 500 MG tablet Take 1 tablet (500 mg total) by mouth every 8 (eight) hours as needed for muscle spasms.  . Multiple Vitamin (MULTIVITAMIN) tablet Take 1 tablet by mouth daily.  . propranolol ER (INDERAL LA) 60 MG 24 hr capsule TAKE 1 CAPSULE BY MOUTH  DAILY.   No facility-administered encounter medications on file as of 06/15/2020.  :  Review of Systems:  Out of a complete 14 point review of systems, all are reviewed and negative with Sean exception of these symptoms as listed below: Review of Systems  Neurological:       Here for 3 month f/u on PD denies any falls. Sean Ward does report right knee pain that has affected his walking. Sean Ward has been advised to have surgery bone does not want to at this time.     Objective:  Neurological Exam  Physical Exam Physical Examination:   Vitals:   06/15/20 1138  BP: 116/82  Pulse: 66  SpO2: 96%    General Examination: Sean Ward is a very pleasant 79 y.o. male in no acute distress. Sean Ward appears mildly deconditioned, well-groomed.  Good spirits, better than last time.   HEENT:Normocephalic, atraumatic, pupils are equal, round and reactive to light, extraocular tracking saccadic breakdown. Sean Ward has moderate facial masking, very slight left ptosis. Neck rigidity is moderate. Sean Ward has an intermittent lower lip and jaw tremor. Sean Ward has no drooling. Hearing is grossly intact.  Speech is significantly more hypophonic and slightly dysarthric.  Chest:Clear to auscultation without wheezing, rhonchi or crackles noted.  Heart:S1+S2+0,irregularly irreg.  Abdomen:Soft, non-tender and non-distended with normal bowel sounds appreciated on auscultation.  Extremities:There istrace to 1+edema in thedistal lower extremities, left more than right.  Skin: Warm and dry without trophic changes noted.  Musculoskeletal: exam reveals right knee discomfort and Sean Ward wears a brace around it.  Sean Ward has a significant upper body tilt to Sean right and neck tilt to Sean right while sitting and standing.    Neurologically: Mental status: Sean Ward is awake, alert and oriented in all 4 spheres. Hisimmediate and remote memory, attention, language skills and fund of knowledge are appropriate, but Sean Ward does have more  slowness in thinking and word finding difficulties today. there is no evidence of aphasia, agnosia, apraxia or anomia. Speech is mildly hypophonic, no dysarthria. Thought process is linear. Mood is normaland affect is better.  Cranial nerves II - XII are as described above under HEENT exam.  Motor exam: Normal bulk, and strength for age, tone is mildly increased. Sean Ward hasa mild to moderate resting tremor in Sean right lower extremity and left upper extremity.  Fine motor skills are moderately impaired on Sean left, slightly better on Sean right.  Sean Ward stands up with difficulty and posture is moderately stooped, significant tilt to Sean right, also mildly bent knee on Sean right.  Sean Ward walks with difficulty, no walking aid.  Sean Ward has a significant limp on Sean right and insecurity with turns.   Balance is impaired but stable.    Assessment and Plan:  In summary, Sean Ward a very pleasant 79 year old male with an underlying medical history of bronchitis, a fib, osteoarthritis,and mildly overweight state, who presents for follow-up consultation of his left-sided predominant Parkinson's disease, with progression noted over time.  Sean Ward has had decline in his mobility but also more issues with right knee arthritis.  Sean Ward has had steroid injections and ultimately may need a knee replacement.  Sean Ward is reluctant to pursue  this.  Sean Ward has had some word finding difficulties apparent today.  We will continue to monitor.  Motor wise, Sean Ward is slightly better compared to last time and has increased his Sinemet to 1-1/2 pills 5 times a day as well as added a Sinemet CR at bedtime.  His balance is impaired.  Sean Ward is encouraged to start using a cane.  We talked about fall risk last time and Sean Ward was advised to start using a walker but Sean Ward has not been using a walker or cane.   Sean Ward had a brain MRI without contrast on 8/28/17which showed chronic white matter changes, no acute changes, no significant atrophy for age.  Sean Ward is advised to continue  with his Sinemet and Sinemet CR at Sean current doses.  His prescriptions are up-to-date.  Sean Ward is advised to follow-up in 4 months, sooner if needed.  I answered all his questions today and Sean Ward was in agreement.  I spent 30 minutes in total face-to-face time and in reviewing records during pre-charting, more than 50% of which was spent in counseling and coordination of care, reviewing test results, reviewing medications and treatment regimen and/or in discussing or reviewing Sean diagnosis of PD, Sean prognosis and treatment options. Pertinent laboratory and imaging test results that were available during this visit with Sean Ward were reviewed by me and considered in my medical decision making (see chart for details).

## 2020-06-15 NOTE — Patient Instructions (Signed)
As discussed, we will continue with your current medication regimen including Sinemet generic immediate release 1-1/2 pills 5 times a day and Sinemet generic long-acting 1 pill at bedtime.  Please follow-up routinely in 4 months.  Consider using a cane for your right knee pain.  Please also check with your orthopedic specialist if a cane would be helpful.  Call us with any questions in the meantime.

## 2020-06-28 MED FILL — PROPRANOLOL ER 60 MG CAP: 60 | 30 days supply | Qty: 30 | Fill #8

## 2020-06-29 MED FILL — CARBIDOPA-LEVODOPA 25-100 T: 25-100 | 30 days supply | Qty: 225 | Fill #3

## 2020-07-04 ENCOUNTER — Other Ambulatory Visit: Payer: Self-pay

## 2020-07-04 ENCOUNTER — Encounter: Payer: Self-pay | Admitting: Critical Care Medicine

## 2020-07-04 ENCOUNTER — Ambulatory Visit: Payer: Medicare Other | Attending: Critical Care Medicine | Admitting: Critical Care Medicine

## 2020-07-04 VITALS — BP 91/65 | HR 64 | Temp 97.5°F | Resp 18 | Ht 69.0 in | Wt 165.8 lb

## 2020-07-04 DIAGNOSIS — Z23 Encounter for immunization: Secondary | ICD-10-CM

## 2020-07-04 DIAGNOSIS — M1711 Unilateral primary osteoarthritis, right knee: Secondary | ICD-10-CM

## 2020-07-04 DIAGNOSIS — G20A1 Parkinson's disease without dyskinesia, without mention of fluctuations: Secondary | ICD-10-CM

## 2020-07-04 DIAGNOSIS — A601 Herpesviral infection of perianal skin and rectum: Secondary | ICD-10-CM

## 2020-07-04 DIAGNOSIS — I4819 Other persistent atrial fibrillation: Secondary | ICD-10-CM | POA: Diagnosis not present

## 2020-07-04 DIAGNOSIS — G20 Parkinson's disease: Secondary | ICD-10-CM

## 2020-07-04 DIAGNOSIS — R2681 Unsteadiness on feet: Secondary | ICD-10-CM

## 2020-07-04 DIAGNOSIS — E785 Hyperlipidemia, unspecified: Secondary | ICD-10-CM

## 2020-07-04 HISTORY — DX: Herpesviral infection of perianal skin and rectum: A60.1

## 2020-07-04 MED ORDER — CALAMINE EX LOTN
1.0000 | TOPICAL_LOTION | CUTANEOUS | 0 refills | Status: DC | PRN
Start: 2020-07-04 — End: 2020-08-23

## 2020-07-04 MED ORDER — ACYCLOVIR 800 MG PO TABS: 800.0000 mg | ORAL_TABLET | Freq: Two times a day (BID) | ORAL | 0 refills | Status: AC

## 2020-07-04 MED FILL — ACYCLOVIR 800 MG TABLET: 800 | 5 days supply | Qty: 10 | Fill #0

## 2020-07-04 NOTE — Assessment & Plan Note (Signed)
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

## 2020-07-04 NOTE — Progress Notes (Signed)
Reports rash that started to R hip and spread down leg x 1 wk ago, pain radiating up/down R leg, had itching/drainange last week, used OTC cream w/ some relief   Interested in flu vaccine today, declines pneumovax today

## 2020-07-04 NOTE — Assessment & Plan Note (Signed)
Unsteady gait on the basis of the parkinsonism

## 2020-07-04 NOTE — Patient Instructions (Signed)
Take acyclovir 800mg  twice a day for 5 days  Apply Calamine lotion to the leg of affected areas twice a day for skin itching  A flu vaccine was given  A list of dentists and eye MDs given  Please obtain a cane to ambulate   Return Dr 3 weeks

## 2020-07-04 NOTE — Assessment & Plan Note (Signed)
I reiterated with this patient the importance of him obtaining a cane to assist with ambulation he took this under advisement he will follow-up with neurology maintain his Sinemet

## 2020-07-04 NOTE — Assessment & Plan Note (Signed)
Patient continues to follow-up with orthopedics on his right knee

## 2020-07-04 NOTE — Progress Notes (Signed)
Subjective:    Patient ID: Sean Ward, male    DOB: November 22, 1940, 79 y.o.   MRN: [id number]  79 y.o.M PCP FULP The patient comes in today for work in visit and complains of a rash on the posterior right gluteal area and right posterior thigh for the past 1/2 weeks.  It is very pruritic and irritating.  He was uncertain whether an insect bite occurred.  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  Patient has parkinsonism is being followed closely by neurology and his Sinemet doses have been adjusted.  He denies any recent falls with this.  He has been recommended to receive a cane or rolling walker but he has declined this in the past.  He would like referrals to ophthalmology and dental care.  The patient states that he also has low back pain and is being evaluated by orthopedic surgeon and MRI is upcoming at this time.  His primary complaint today is that of getting the skin condition assessed    Past Medical History:  Diagnosis Date  . Anticoagulant long-term use   . Back pain with radiation   . History of bronchitis   . Parkinson's disease (HCC)   . 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 Scoliosis of cervicothoracic spine   . Scoliosis of thoracolumbar spine   . Spondylosis of lumbar spine      Family History  Problem Relation Age of Onset  . Deafness Mother   . Deafness Sister   . Heart disease Neg Hx      Social History   Socioeconomic History  . Marital status: Legally Separated    Spouse name: Not on file  . Number of children: 2  . Years of education: BA  . Highest education level: Not on file  Occupational History  . Not on file  Tobacco Use  . Smoking status: Never Smoker  . Smokeless tobacco: Never Used  Vaping Use  . Vaping Use: Never used  Substance and Sexual Activity  . Alcohol use: Yes    Alcohol/week: 7.0 standard drinks    Types: 7  Glasses of wine per week    Comment: 1 glass of red wine with dinner  . Drug use: No  . Sexual activity: Not Currently  Other Topics Concern  . Not on file  Social History Narrative   Drinks 2 cups of coffee a day    Social Determinants of Health   Financial Resource Strain:   . Difficulty of Paying Living Expenses: Not on file  Food Insecurity:   . Worried About Programme researcher, broadcasting/film/video in the Last Year: Not on file  . Ran Out of Food in the Last Year: Not on file  Transportation Needs:   . Lack of Transportation (Medical): Not on file  . Lack of Transportation (Non-Medical): Not on file  Physical Activity:   . Days of Exercise per Week: Not on file  . Minutes of Exercise per Session: Not on file  Stress:   . Feeling of Stress : Not on file  Social Connections:   . Frequency of Communication with Friends and Family: Not on file  . Frequency of Social Gatherings with Friends and Family: Not on file  . Attends Religious Services: Not on file  . Active Member of Clubs or Organizations: Not on file  . Attends Banker Meetings: Not on  file  . Marital Status: Not on file  Intimate Partner Violence:   . Fear of Current or Ex-Partner: Not on file  . Emotionally Abused: Not on file  . Physically Abused: Not on file  . Sexually Abused: Not on file     No Known Allergies   Outpatient Medications Prior to Visit  Medication Sig Dispense Refill  . carbidopa-levodopa (SINEMET CR) 50-200 MG tablet Take 1 tablet by mouth at bedtime. Take at 10PM 90 tablet 3  . carbidopa-levodopa (SINEMET IR) 25-100 MG tablet Take 1.5 tablets by mouth 5 (five) times daily. Take 1 1/2 pills 5 times a day, at 8 am, 11 am, 2 PM, 5 pm and 8 PM. 680 tablet 3  . ELIQUIS 5 MG TABS tablet TAKE 1 TABLET (5 MG TOTAL) BY MOUTH 2 (TWO) TIMES DAILY. 60 tablet 0  . furosemide (LASIX) 20 MG tablet Take 1 tablet (20 mg total) by mouth daily as needed (swelling or shortness of breath). 90 tablet 3  . latanoprost  (XALATAN) 0.005 % ophthalmic solution Place 1 drop into both eyes at bedtime.    . Multiple Vitamin (MULTIVITAMIN) tablet Take 1 tablet by mouth daily.    . propranolol ER (INDERAL LA) 60 MG 24 hr capsule TAKE 1 CAPSULE BY MOUTH DAILY. 90 capsule 3  . methocarbamol (ROBAXIN) 500 MG tablet Take 1 tablet (500 mg total) by mouth every 8 (eight) hours as needed for muscle spasms. (Patient not taking: Reported on 07/04/2020) 60 tablet 0   No facility-administered medications prior to visit.    Review of Systems  Constitutional: Negative.   HENT: Negative.   Eyes: Negative.   Respiratory: Negative.   Cardiovascular: Negative.   Gastrointestinal: Negative.   Genitourinary: Negative.   Musculoskeletal: Positive for back pain, gait problem and joint swelling.  Skin: Positive for rash.  Neurological: Positive for tremors and weakness. Negative for dizziness, seizures, facial asymmetry, speech difficulty, light-headedness and numbness.  Psychiatric/Behavioral: Negative.        Objective:   Physical Exam Vitals:   07/04/20 1053  BP: 91/65  Pulse: 64  Resp: 18  Temp: (!) 97.5 F (36.4 C)  TempSrc: Temporal  SpO2: 98%  Weight: 165 lb 12.8 oz (75.2 kg)  Height: 5\' 9"  (1.753 m)    Gen: Pleasant, well-nourished, in no distress,  normal affect  ENT: No lesions,  mouth clear,  oropharynx clear, no postnasal drip  Neck: No JVD, no TMG, no carotid bruits  Lungs: No use of accessory muscles, no dullness to percussion, clear without rales or rhonchi  Cardiovascular: RRR, heart sounds normal, no murmur or gallops, no peripheral edema  Abdomen: soft and NT, no HSM,  BS normal  Musculoskeletal: Gross deformity of the right knee with apparent bone-on-bone and no evidence of active cartilage in that knee without effusion  Neuro: alert, non focal  Skin: Warm, there is herpetiform lesion seen along the posterior aspect of the right gluteal into the anal area and extending down the back of the  thigh on the right these lesions have the most consistency of herpes simplex lesions and it is not in a dermatomal distribution to be compatible with herpes zoster    No results found.        Assessment & Plan:  I personally reviewed all images and lab data in the [facility] system as well as any outside material available during this office visit and agree with the  radiology impressions.   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  Diagnoses and all orders for this visit:  Herpes simplex infection of perianal skin  Parkinson disease (HCC)  Primary osteoarthritis of right knee  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HCC)  Unsteady gait  Dyslipidemia  Need for immunization against influenza -     Flu Vaccine QUAD 36+ mos IM  Other orders -     acyclovir (ZOVIRAX) 800 MG tablet; Take 1 tablet (800 mg total) by mouth 2 (two) times daily for 5 days. -     calamine lotion; Apply 1 application topically as needed for itching. To affected area on leg    A flu vaccine was given at this visit

## 2020-07-18 MED FILL — CARBIDOPA-LEVO ER 50-200 TA: 50-200 | 30 days supply | Qty: 30 | Fill #4

## 2020-07-25 ENCOUNTER — Ambulatory Visit: Payer: Medicare Other | Admitting: Critical Care Medicine

## 2020-07-27 MED FILL — PROPRANOLOL ER 60 MG CAP: 60 | 30 days supply | Qty: 30 | Fill #9

## 2020-07-29 MED FILL — CARBIDOPA-LEVODOPA 25-100 T: 25-100 | 30 days supply | Qty: 225 | Fill #4

## 2020-08-15 MED FILL — CARBIDOPA-LEVO ER 50-200 TA: 50-200 | 30 days supply | Qty: 30 | Fill #5

## 2020-08-22 ENCOUNTER — Telehealth: Payer: Self-pay | Admitting: Neurology

## 2020-08-22 ENCOUNTER — Telehealth: Payer: Self-pay | Admitting: Interventional Cardiology

## 2020-08-22 NOTE — Telephone Encounter (Signed)
Pt called wanting to speak to RN or provider about the booster injection for the Covid Vaccine. Please advise.

## 2020-08-22 NOTE — Telephone Encounter (Signed)
° ° °  Pt would like to speak with Dr. Varanasi's nurse °

## 2020-08-22 NOTE — Telephone Encounter (Signed)
Called and spoke to patient. He states that he is interested in getting a booster and wanted to know if it is okay. Made patient aware that we recommend patients get the booster when eligible.

## 2020-08-23 ENCOUNTER — Other Ambulatory Visit: Payer: Self-pay | Admitting: Critical Care Medicine

## 2020-08-23 ENCOUNTER — Ambulatory Visit: Payer: Medicare Other | Attending: Critical Care Medicine | Admitting: Critical Care Medicine

## 2020-08-23 ENCOUNTER — Encounter: Payer: Self-pay | Admitting: Critical Care Medicine

## 2020-08-23 ENCOUNTER — Other Ambulatory Visit: Payer: Self-pay

## 2020-08-23 VITALS — BP 107/73 | HR 86 | Temp 97.9°F | Wt 170.6 lb

## 2020-08-23 DIAGNOSIS — E785 Hyperlipidemia, unspecified: Secondary | ICD-10-CM

## 2020-08-23 DIAGNOSIS — G20 Parkinson's disease: Secondary | ICD-10-CM

## 2020-08-23 DIAGNOSIS — I4819 Other persistent atrial fibrillation: Secondary | ICD-10-CM

## 2020-08-23 DIAGNOSIS — R251 Tremor, unspecified: Secondary | ICD-10-CM

## 2020-08-23 DIAGNOSIS — M1711 Unilateral primary osteoarthritis, right knee: Secondary | ICD-10-CM

## 2020-08-23 DIAGNOSIS — R2681 Unsteadiness on feet: Secondary | ICD-10-CM

## 2020-08-23 DIAGNOSIS — A601 Herpesviral infection of perianal skin and rectum: Secondary | ICD-10-CM | POA: Diagnosis not present

## 2020-08-23 MED ORDER — APIXABAN 5 MG PO TABS
ORAL_TABLET | ORAL | 6 refills | Status: DC
Start: 2020-08-23 — End: 2020-11-22

## 2020-08-23 MED ORDER — CARBIDOPA-LEVODOPA 25-100 MG PO TABS: 1.5000 | ORAL_TABLET | Freq: Every day | ORAL | 3 refills | Status: DC

## 2020-08-23 MED ORDER — FUROSEMIDE 20 MG PO TABS
20.0000 mg | ORAL_TABLET | Freq: Every day | ORAL | 3 refills | Status: DC | PRN
Start: 2020-08-23 — End: 2020-11-22

## 2020-08-23 MED ORDER — PROPRANOLOL HCL ER 60 MG PO CP24: 60.0000 mg | ORAL_CAPSULE | Freq: Every day | ORAL | 3 refills | Status: DC

## 2020-08-23 NOTE — Telephone Encounter (Signed)
I called pt to discuss. No answer, left a message asking him to call me back. 

## 2020-08-23 NOTE — Telephone Encounter (Signed)
I called pt. He wants to know if Dr. Frances Furbish recommends getting the covid booster. His second Pfizer Covid vaccine was 05/09/2020. I advised him that he shouldn't be due for the booster until January 19th, 2022, which is the recommended 6 month wait time. Pt still would like Dr. Teofilo Pod opinion.

## 2020-08-23 NOTE — Patient Instructions (Addendum)
COVID-19 Vaccine Information can be found at: [email or web address] For questions related to vaccine distribution or appointments, please email [email or web address] or call [phone].  No change in medications  You may get your Pfizer booster in January 6 months after your last booster which was in July  When you travel to [place] on likely can get you a 36-month supply of medicines and then determine with your pharmacy if refills can be mailed to you overseas  Return Dr. Delford Field in 3 months

## 2020-08-23 NOTE — Telephone Encounter (Signed)
I would recommend a Covid booster 6 months out from his second shot.  If his medical doctor has any reservations, he should talk with her as well, just to make sure.

## 2020-08-23 NOTE — Assessment & Plan Note (Signed)
Continue Sinemet for now follow-up with neurology

## 2020-08-23 NOTE — Assessment & Plan Note (Signed)
Chronic atrial fibrillation rate is controlled  Continue anticoagulant as well as propranolol

## 2020-08-23 NOTE — Assessment & Plan Note (Signed)
Lipids are not significantly elevated no indication for statin therapy

## 2020-08-23 NOTE — Progress Notes (Signed)
Pt experiencing lower back pain

## 2020-08-23 NOTE — Progress Notes (Signed)
Subjective:    Patient ID: Sean Ward, male    DOB: [birth year], 79 y.o.   MRN: [id number]  07/04/20 78 y.o.M PCP FULP The patient comes in today for work in visit and complains of a rash on the posterior right gluteal area and right posterior thigh for the past 1/2 weeks.  It is very pruritic and irritating.  He was uncertain whether an insect bite occurred.  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  Patient has parkinsonism is being followed closely by neurology and his Sinemet doses have been adjusted.  He denies any recent falls with this.  He has been recommended to receive a cane or rolling walker but he has declined this in the past.  He would like referrals to ophthalmology and dental care.  The patient states that he also has low back pain and is being evaluated by orthopedic surgeon and MRI is upcoming at this time.  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  With the course of acyclovir the area on the buttocks has dried up.  Patient comes in today also for follow-up of his parkinsonism chronic atrial fibrillation chronic arthritis in the right knee.  He does need a knee replacement on the right knee and he is trying to put this off as long as he can.  He does plan to travel to [place] and spent at least 6 months visiting relatives in [place].  Patient is taking his Sinemet for parkinsonism and has not had much change in this regard.  The patient's atrial fibrillation is well controlled with beta-blocker and he remains on chronic anticoagulant therapy.  All medications are able to be refilled. There are no other complaints   Past Medical History:  Diagnosis Date  . Anticoagulant long-term use   .  Back pain with radiation   . Hernia, inguinal, left 05/03/2014  . 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 History of bronchitis   . Inguinal hernia, right 04/08/2014  . Parkinson's disease (HCC)   . 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 Scoliosis of cervicothoracic spine   . Scoliosis of thoracolumbar spine   . Spondylosis of lumbar spine      Family History  Problem Relation Age of Onset  . Deafness Mother   . Deafness Sister   . Heart disease Neg Hx      Social History   Socioeconomic History  . Marital status: Legally Separated    Spouse name: Not on file  . Number of children: 2  . Years of education: BA  . Highest education level: Not on file  Occupational History  . Not on file  Tobacco Use  . Smoking status: Never Smoker  . Smokeless tobacco: Never Used  Vaping Use  . Vaping Use: Never used  Substance and Sexual Activity  . Alcohol use: Yes    Alcohol/week: 7.0 standard drinks    Types: 7 Glasses of wine per week    Comment: 1 glass of red wine with dinner  . Drug use: No  . Sexual activity: Not Currently  Other Topics Concern  . Not on file  Social History Narrative   Drinks 2 cups of coffee a day  Social Determinants of Health   Financial Resource Strain:   . Difficulty of Paying Living Expenses: Not on file  Food Insecurity:   . Worried About Programme researcher, broadcasting/film/video in the Last Year: Not on file  . Ran Out of Food in the Last Year: Not on file  Transportation Needs:   . Lack of Transportation (Medical): Not on file  . Lack of Transportation (Non-Medical): Not on file  Physical Activity:   . Days of Exercise per Week: Not on file  . Minutes of Exercise per Session: Not on file  Stress:   . Feeling of Stress : Not on file  Social Connections:   . Frequency of Communication with Friends and Family: Not on file  . Frequency of Social Gatherings with Friends and Family: Not on file  . Attends Religious Services: Not on file  . Active  Member of Clubs or Organizations: Not on file  . Attends Banker Meetings: Not on file  . Marital Status: Not on file  Intimate Partner Violence:   . Fear of Current or Ex-Partner: Not on file  . Emotionally Abused: Not on file  . Physically Abused: Not on file  . Sexually Abused: Not on file     No Known Allergies   Outpatient Medications Prior to Visit  Medication Sig Dispense Refill  . ELIQUIS 5 MG TABS tablet TAKE 1 TABLET (5 MG TOTAL) BY MOUTH 2 (TWO) TIMES DAILY. 60 tablet 0  . furosemide (LASIX) 20 MG tablet Take 1 tablet (20 mg total) by mouth daily as needed (swelling or shortness of breath). 90 tablet 3  . latanoprost (XALATAN) 0.005 % ophthalmic solution Place 1 drop into both eyes at bedtime.    . Multiple Vitamin (MULTIVITAMIN) tablet Take 1 tablet by mouth daily.    . propranolol ER (INDERAL LA) 60 MG 24 hr capsule TAKE 1 CAPSULE BY MOUTH DAILY. 90 capsule 3  . calamine lotion Apply 1 application topically as needed for itching. To affected area on leg (Patient not taking: Reported on 08/23/2020) 120 mL 0  . carbidopa-levodopa (SINEMET CR) 50-200 MG tablet Take 1 tablet by mouth at bedtime. Take at 10PM (Patient not taking: Reported on 08/23/2020) 90 tablet 3  . carbidopa-levodopa (SINEMET IR) 25-100 MG tablet Take 1.5 tablets by mouth 5 (five) times daily. Take 1 1/2 pills 5 times a day, at 8 am, 11 am, 2 PM, 5 pm and 8 PM. (Patient not taking: Reported on 08/23/2020) 680 tablet 3   No facility-administered medications prior to visit.    Review of Systems  Constitutional: Negative.   HENT: Negative.   Eyes: Negative.   Respiratory: Negative.   Cardiovascular: Negative.   Gastrointestinal: Negative.   Genitourinary: Negative.   Musculoskeletal: Positive for back pain, gait problem and joint swelling.  Skin: Positive for rash.  Neurological: Positive for tremors and weakness. Negative for dizziness, seizures, facial asymmetry, speech difficulty,  light-headedness and numbness.  Psychiatric/Behavioral: Negative.        Objective:   Physical Exam Vitals:   08/23/20 1335  BP: 107/73  Pulse: 86  Temp: 97.9 F (36.6 C)  SpO2: 100%  Weight: 170 lb 9.6 oz (77.4 kg)    Gen: Pleasant, well-nourished, in no distress,  normal affect  ENT: No lesions,  mouth clear,  oropharynx clear, no postnasal drip  Neck: No JVD, no TMG, no carotid bruits  Lungs: No use of accessory muscles, no dullness to percussion, clear without rales or rhonchi  Cardiovascular: Irregularly irregular rhythm rate well controlled, heart sounds normal, no murmur or gallops, no peripheral edema  Abdomen: soft and NT, no HSM,  BS normal  Musculoskeletal: Gross deformity of the right knee with apparent bone-on-bone and no evidence of active cartilage in that knee without effusion  Neuro: alert, non focal  Skin: Warm, the herpetiform lesion on the posterior aspect of the right gluteal area near the anus area has resolved         Assessment & Plan:  I personally reviewed all images and lab data in the [facility] system as well as any outside material available during this office visit and agree with the  radiology impressions.   Atrial fibrillation Chronic atrial fibrillation rate is controlled  Continue anticoagulant as well as propranolol  Parkinson disease (HCC) Continue Sinemet for now follow-up with neurology  Herpes simplex infection of perianal skin HSV infection of the perianal skin has resolved  Dyslipidemia Lipids are not significantly elevated no indication for statin therapy  Unsteady gait Unsteady gait on the basis of parkinsonism patient again counseled regarding fall risk   Decklin was seen today for follow-up.  Diagnoses and all orders for this visit:  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HCC)  Herpes simplex infection of perianal skin  Parkinson disease (HCC)  Primary osteoarthritis of right knee  Unsteady  gait  Dyslipidemia

## 2020-08-23 NOTE — Assessment & Plan Note (Signed)
HSV infection of the perianal skin has resolved

## 2020-08-23 NOTE — Assessment & Plan Note (Signed)
Unsteady gait on the basis of parkinsonism patient again counseled regarding fall risk

## 2020-08-24 MED FILL — PROPRANOLOL ER 60 MG CAP: 60 | 30 days supply | Qty: 30 | Fill #0

## 2020-08-24 MED FILL — CARBIDOPA/LEVO 25/100 TAB: 25-100 | 30 days supply | Qty: 225 | Fill #0

## 2020-09-01 ENCOUNTER — Ambulatory Visit: Payer: Medicare Other | Admitting: Internal Medicine

## 2020-09-12 ENCOUNTER — Telehealth: Payer: Self-pay | Admitting: Family Medicine

## 2020-09-12 MED FILL — !ELIQUIS 5MG TABLET: 5 | 30 days supply | Qty: 60 | Fill #0

## 2020-09-12 MED FILL — CARBIDOPA-LEVO ER 50-200 TA: 50-200 | 30 days supply | Qty: 30 | Fill #6

## 2020-09-12 NOTE — Telephone Encounter (Signed)
Copied from CRM [id number]. Topic: General - Other >> Sep 12, 2020 11:48 AM Herby Abraham C wrote: Reason for CRM: pt would like to know if he should get his booster shot? Pt says that he is a current pt of Dr. Delford Field.   [phone]

## 2020-09-23 MED FILL — PROPRANOLOL ER 60 MG CAP: 60 | 30 days supply | Qty: 30 | Fill #1

## 2020-09-28 ENCOUNTER — Other Ambulatory Visit: Payer: Self-pay

## 2020-09-28 ENCOUNTER — Ambulatory Visit: Payer: Medicare Other | Attending: Physician Assistant | Admitting: Physician Assistant

## 2020-09-28 VITALS — BP 111/66 | HR 68 | Temp 98.7°F | Resp 18 | Ht 68.0 in | Wt 168.0 lb

## 2020-09-28 DIAGNOSIS — M79601 Pain in right arm: Secondary | ICD-10-CM

## 2020-09-28 DIAGNOSIS — M779 Enthesopathy, unspecified: Secondary | ICD-10-CM

## 2020-09-28 NOTE — Patient Instructions (Signed)
I encourage you to ice your arm several times throughout the day, use a brace as we discussed to help reduce the inflammation.  Please let us know if this does not improve by the end of next week  Roney Jaffe, PA-C Physician Assistant [facility] [email or web address]   Tendinitis  Tendinitis is inflammation of a tendon. A tendon is a strong cord of tissue that connects muscle to bone. Tendinitis can affect any tendon, but it most commonly affects the:  Shoulder tendon (rotator cuff).  Ankle tendon (Achilles tendon).  Elbow tendon (triceps tendon).  Tendons in the wrist. What are the causes? This condition may be caused by:  Overusing a tendon or muscle. This is common.  Age-related wear and tear.  Injury.  Inflammatory conditions, such as arthritis.  Certain medicines. What increases the risk? You are more likely to develop this condition if you do activities that involve the same movements over and over again (repetitive motions). What are the signs or symptoms? Symptoms of this condition may include:  Pain.  Tenderness.  Mild swelling.  Decreased range of motion. How is this diagnosed? This condition is diagnosed with a physical exam. You may also have tests, such as:  Ultrasound. This uses sound waves to make an image of the inside of your body in the affected area.  MRI. How is this treated? This condition may be treated by resting, icing, applying pressure (compression), and raising (elevating) the affected area above the level of your heart. This is known as RICE therapy. Treatment may also include:  Medicines to help reduce inflammation or to help reduce pain.  Exercises or physical therapy to strengthen and stretch the tendon.  A brace or splint.  Surgery. This is rarely needed. Follow these instructions at home: If you have a splint or brace:  Wear the splint or brace as told by your  health care provider. Remove it only as told by your health care provider.  Loosen the splint or brace if your fingers or toes tingle, become numb, or turn cold and blue.  Keep the splint or brace clean.  If the splint or brace is not waterproof: ? Do not let it get wet. ? Cover it with a watertight covering when you take a bath or shower. Managing pain, stiffness, and swelling  If directed, put ice on the affected area. ? If you have a removable splint or brace, remove it as told by your health care provider. ? Put ice in a plastic bag. ? Place a towel between your skin and the bag. ? Leave the ice on for 20 minutes, 2-3 times a day.  Move the fingers or toes of the affected limb often, if this applies. This can help to prevent stiffness and lessen swelling.  If directed, raise (elevate) the affected area above the level of your heart while you are sitting or lying down.  If directed, apply heat to the affected area before you exercise. Use the heat source that your health care provider recommends, such as a moist heat pack or a heating pad.     ? Place a towel between your skin and the heat source. ? Leave the heat on for 20-30 minutes. ? Remove the heat if your skin turns bright red. This is especially important if you are unable to feel pain, heat, or cold. You may have a greater risk of getting burned. Driving  Do not drive or use heavy machinery while taking prescription pain  medicine.  Ask your health care provider when it is safe to drive if you have a splint or brace on any part of your arm or leg. Activity  Rest the affected area as told by your health care provider.  Return to your normal activities as told by your health care provider. Ask your health care provider what activities are safe for you.  Avoid using the affected area while you are experiencing symptoms of tendinitis.  Do exercises as told by your health care provider. General instructions  If you  have a splint, do not put pressure on any part of the splint until it is fully hardened. This may take several hours.  Wear an elastic bandage or compression wrap only as told by your health care provider.  Take over-the-counter and prescription medicines only as told by your health care provider.  Keep all follow-up visits as told by your health care provider. This is important. Contact a health care provider if:  Your symptoms do not improve.  You develop new, unexplained problems, such as numbness in your hands. Summary  Tendinitis is inflammation of a tendon.  You are more likely to develop this condition if you do activities that involve the same movements over and over again.  This condition may be treated by resting, icing, applying pressure (compression), and elevating the area above the level of your heart. This is known as RICE therapy.  Avoid using the affected area while you are experiencing symptoms of tendinitis. This information is not intended to replace advice given to you by your health care provider. Make sure you discuss any questions you have with your health care provider. Document Revised: 04/15/2018 Document Reviewed: 02/26/2018 Elsevier Patient Education  2020 [employer].

## 2020-09-28 NOTE — Progress Notes (Signed)
Established Patient Office Visit  Subjective:  Patient ID: Sean Ward, male    DOB: Oct 27, 1940  Age: 79 y.o. MRN: [id number]  CC:  Chief Complaint  Patient presents with  . Arm Pain    right    HPI Sean Ward reports that he has been having right arm pain for the past week, worse with movement. Pain started in elbow , hurts to put  Pressure on his elbows- pain will radiate up arm. Denies injury / trauma   States that he has tried tylenol 500 mg with a little relief     Past Medical History:  Diagnosis Date  . Anticoagulant long-term use   . Back pain with radiation   . Hernia, inguinal, left 05/03/2014  . 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 History of bronchitis   . Inguinal hernia, right 04/08/2014  . Parkinson's disease (HCC)   . 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 Scoliosis of cervicothoracic spine   . Scoliosis of thoracolumbar spine   . Spondylosis of lumbar spine     Past Surgical History:  Procedure Laterality Date  . HERNIA REPAIR    . TONSILLECTOMY      Family History  Problem Relation Age of Onset  . Deafness Mother   . Deafness Sister   . Heart disease Neg Hx     Social History   Socioeconomic History  . Marital status: Legally Separated    Spouse name: Not on file  . Number of children: 2  . Years of education: BA  . Highest education level: Not on file  Occupational History  . Not on file  Tobacco Use  . Smoking status: Never Smoker  . Smokeless tobacco: Never Used  Vaping Use  . Vaping Use: Never used  Substance and Sexual Activity  . Alcohol use: Yes    Alcohol/week: 7.0 standard drinks    Types: 7 Glasses of wine per week    Comment: 1 glass of red wine with dinner  . Drug use: No  . Sexual activity: Not Currently  Other Topics Concern  . Not on file  Social History Narrative   Drinks 2 cups of coffee a day    Social Determinants of Health   Financial Resource Strain:   . Difficulty of Paying  Living Expenses: Not on file  Food Insecurity:   . Worried About Programme researcher, broadcasting/film/video in the Last Year: Not on file  . Ran Out of Food in the Last Year: Not on file  Transportation Needs:   . Lack of Transportation (Medical): Not on file  . Lack of Transportation (Non-Medical): Not on file  Physical Activity:   . Days of Exercise per Week: Not on file  . Minutes of Exercise per Session: Not on file  Stress:   . Feeling of Stress : Not on file  Social Connections:   . Frequency of Communication with Friends and Family: Not on file  . Frequency of Social Gatherings with Friends and Family: Not on file  . Attends Religious Services: Not on file  . Active Member of Clubs or Organizations: Not on file  . Attends Banker Meetings: Not on file  . Marital Status: Not on file  Intimate Partner Violence:   . Fear of Current or Ex-Partner: Not on file  . Emotionally Abused: Not on file  . Physically Abused: Not on file  . Sexually Abused: Not on file    Outpatient Medications Prior to  Visit  Medication Sig Dispense Refill  . apixaban (ELIQUIS) 5 MG TABS tablet TAKE 1 TABLET (5 MG TOTAL) BY MOUTH 2 (TWO) TIMES DAILY. 60 tablet 6  . carbidopa-levodopa (SINEMET IR) 25-100 MG tablet Take 1.5 tablets by mouth 5 (five) times daily. Take 1 1/2 pills 5 times a day, at 8 am, 11 am, 2 PM, 5 pm and 8 PM. 680 tablet 3  . furosemide (LASIX) 20 MG tablet Take 1 tablet (20 mg total) by mouth daily as needed (swelling or shortness of breath). 90 tablet 3  . latanoprost (XALATAN) 0.005 % ophthalmic solution Place 1 drop into both eyes at bedtime.    . Multiple Vitamin (MULTIVITAMIN) tablet Take 1 tablet by mouth daily.    . propranolol ER (INDERAL LA) 60 MG 24 hr capsule Take 1 capsule (60 mg total) by mouth daily. 90 capsule 3   No facility-administered medications prior to visit.    No Known Allergies  ROS Review of Systems  Constitutional: Negative.   HENT: Negative.   Eyes: Negative.    Respiratory: Negative.   Cardiovascular: Negative.   Gastrointestinal: Negative.   Endocrine: Negative.   Genitourinary: Negative.   Musculoskeletal: Positive for arthralgias and myalgias. Negative for joint swelling.  Skin: Negative for color change and wound.  Allergic/Immunologic: Negative.   Neurological: Negative.   Hematological: Negative.   Psychiatric/Behavioral: Negative.       Objective:    Physical Exam Vitals and nursing note reviewed.  Constitutional:      Appearance: Normal appearance.  HENT:     Head: Normocephalic and atraumatic.     Right Ear: External ear normal.     Left Ear: External ear normal.     Nose: Nose normal.     Mouth/Throat:     Mouth: Mucous membranes are moist.     Pharynx: Oropharynx is clear.  Eyes:     Extraocular Movements: Extraocular movements intact.     Conjunctiva/sclera: Conjunctivae normal.     Pupils: Pupils are equal, round, and reactive to light.  Cardiovascular:     Rate and Rhythm: Normal rate. Rhythm regularly irregular.  Pulmonary:     Effort: Pulmonary effort is normal.     Breath sounds: Normal breath sounds.  Musculoskeletal:     Right shoulder: Normal.     Left shoulder: Normal.     Right upper arm: Normal.     Left upper arm: Normal.     Right elbow: No swelling or effusion. Decreased range of motion. Tenderness present.     Left elbow: Normal.     Right forearm: Normal.     Left forearm: Normal.     Cervical back: Normal range of motion and neck supple.  Skin:    General: Skin is warm and dry.  Neurological:     General: No focal deficit present.     Mental Status: He is alert and oriented to person, place, and time.  Psychiatric:        Mood and Affect: Mood normal.        Behavior: Behavior normal.        Thought Content: Thought content normal.        Judgment: Judgment normal.     BP 111/66 (BP Location: Left Arm, Patient Position: Sitting, Cuff Size: Normal)   Pulse 68   Temp 98.7 F (37.1  C) (Oral)   Resp 18   Ht 5\' 8"  (1.727 m)   Wt 168 lb (76.2 kg)  SpO2 100%   BMI 25.54 kg/m  Wt Readings from Last 3 Encounters:  09/28/20 168 lb (76.2 kg)  08/23/20 170 lb 9.6 oz (77.4 kg)  07/04/20 165 lb 12.8 oz (75.2 kg)     There are no preventive care reminders to display for this patient.  There are no preventive care reminders to display for this patient.  Lab Results  Component Value Date   TSH 2.270 08/21/2017   Lab Results  Component Value Date   WBC 6.8 12/22/2019   HGB 14.1 12/22/2019   HCT 40.8 12/22/2019   MCV 92 12/22/2019   PLT 243 12/22/2019   Lab Results  Component Value Date   NA 142 12/31/2019   K 4.4 12/31/2019   CO2 25 12/31/2019   GLUCOSE 106 (H) 12/31/2019   BUN 23 12/31/2019   CREATININE 1.13 12/31/2019   BILITOT 0.9 12/22/2019   ALKPHOS 120 (H) 12/22/2019   AST 24 12/22/2019   ALT 15 12/22/2019   PROT 6.1 12/22/2019   ALBUMIN 3.8 12/22/2019   CALCIUM 8.9 12/31/2019   Lab Results  Component Value Date   CHOL 165 06/01/2020   Lab Results  Component Value Date   HDL 51 06/01/2020   Lab Results  Component Value Date   LDLCALC 97 06/01/2020   Lab Results  Component Value Date   TRIG 91 06/01/2020   Lab Results  Component Value Date   CHOLHDL 3.2 06/01/2020   No results found for: HGBA1C    Assessment & Plan:   Problem List Items Addressed This Visit    None    1. Right arm pain Patient education given on RICE, patient is on anticoagulant, patient education given on avoiding use of NSAIDs, patient to return to office if symptoms do not improve by the end of next week  2. Tendinitis   I have reviewed the patient's medical history (PMH, PSH, Social History, Family History, Medications, and allergies) , and have been updated if relevant. I spent 20 minutes reviewing chart and  face to face time with patient.     No orders of the defined types were placed in this encounter.   Follow-up: No follow-ups on file.     Kasandra Knudsen Mayers, PA-C

## 2020-09-28 NOTE — Progress Notes (Signed)
Patient has eaten today and taken medication today. Patient has right arm pain scaled at an 8 intermittently. Patient complains of pain for a week with ROM.

## 2020-09-29 ENCOUNTER — Encounter: Payer: Self-pay | Admitting: Physician Assistant

## 2020-09-29 MED FILL — CARBIDOPA/LEVO 25/100 TAB: 25-100 | 30 days supply | Qty: 225 | Fill #1

## 2020-10-11 MED FILL — CARBIDOPA-LEVO ER 50-200 TA: 50-200 | 30 days supply | Qty: 30 | Fill #7

## 2020-10-19 ENCOUNTER — Encounter: Payer: Self-pay | Admitting: Neurology

## 2020-10-19 ENCOUNTER — Ambulatory Visit: Payer: Medicare Other | Admitting: Neurology

## 2020-10-19 ENCOUNTER — Other Ambulatory Visit: Payer: Self-pay

## 2020-10-19 VITALS — BP 94/62 | HR 70 | Ht 68.0 in | Wt 174.0 lb

## 2020-10-19 DIAGNOSIS — G20 Parkinson's disease: Secondary | ICD-10-CM

## 2020-10-19 MED ORDER — CARBIDOPA-LEVODOPA ER 50-200 MG PO TBCR: 1.0000 | EXTENDED_RELEASE_TABLET | Freq: Every day | ORAL | 3 refills | Status: DC

## 2020-10-19 NOTE — Patient Instructions (Signed)
It was good to see you again today.  Please continue to try to stay active, I think you may benefit from using your cane more consistently for your right knee pain, in case it wants to give out.  Please try to hydrate well with water.  Continue with Sinemet immediate release generic 1-1/2 pills 5 times a day.  If you miss a dose and it's only been an hour late, you can take the dose, if it is more closer to the next dose, just skip the missed dose.    Continue to take the long-acting Sinemet 50-200 mg strength at night, around 10 PM.  Follow-up in 6 months, call us with any interim questions or concerns.

## 2020-10-19 NOTE — Progress Notes (Signed)
Subjective:    Patient ID: Sean Ward is a 79 y.o. male.  HPI     Interim history:   Sean Ward is a 79 year old right-handed gentleman with an underlying medical history of bronchitis, PAF and osteoarthritis, who presents for follow-up consultation of his parkinsonism. The patient is unaccompanied today.  I last saw Sean Ward on 06/15/2020, at which time he reported feeling fairly stable.  He was on Sinemet 1-1/2 pills 5 times a day and Sinemet CR 1 pill at bedtime.  He had right knee pain.  He had received cortisone injections.  He had seen orthopedics.  He was encouraged to use a cane for safety.  He was contemplating moving to [place] or staying there for 6 months at a time.  Today, 10/19/2020: He reports feeling fairly stable with regards to his PD.  He has not had any recent falls, no significant constipation is reported.  Still struggles with right knee pain, wears a brace most days.  Does not use a cane.  He has had some low back pain and discussed it with his orthopedic doctor who recommended an MRI.  He has not had it yet.  He was planning to go to [place] this month but has postponed his plans.  He is eligible for a Covid booster in January.  He may travel to [place] in February or March.  He continues to take Sinemet generic 1-1/2 pills 5 times a day.  Sometimes he misses a dose.  He is wondering if he should double up the next dose.  He takes the CR at night. For some reason the prescription for their long-acting Sinemet is not showing up on his list.   The patient's allergies, current medications, family history, past medical history, past social history, past surgical history and problem list were reviewed and updated as appropriate.    Previously (copied from previous notes for reference):     I saw Sean Ward on 03/09/2020 after a longer gap of over 1-1/2 years.  He had a recent fall.  He reported feeling worse.  His exam showed progression.  He was advised to increase his Sinemet to 1-1/2  pills 5 times a day and also add a Sinemet CR at bedtime.  Furthermore, he was advised to increase his melatonin to 5 mg at night. He was advised to start using a cane consistently and consider using a walker.       He missed a virtual visit appointment on 03/12/2019 and a face to face appointment on 05/07/19. I saw Sean Ward on 08/27/2018, at which time he reported more tremor in the mornings, on the left side. He was keeping his house very cold, does not like to use the heat in the house and it has been in the 40s in the house in the mornings. He was supposed to see orthopedics for left-sided low back pain with radiation to the left thigh.       I saw Sean Ward on 02/24/2018, at which time he reported doing okay, thankfully no falls. He reported occasional dizziness and lightheadedness. His blood pressure was trending lower. He was tolerating the Eliquis, has had lower extremity swelling, also right knee pain. He was advised by cardiology to use compression socks, but was not wearing them regularly. He was planning a trip to [place]. He was advised to continue with Sinemet 1-1/2 pills 4 times a day.   (He) had to miss an appointment on 02/13/2018. I saw Sean Ward on 08/15/2017, at which time he had  no acute issues but overall was not as active. His balance was not as good. He felt that his legs are heavier. He was taking Sinemet one and half pill 3 times a day, I suggested we increase it to 4 times a day. His heart rate and cardiac auscultation suggested significant irregularities and I suggested he follow-up with cardiology.    Of note, he missed an appointment on 08/14/2017. I saw Sean Ward on 02/12/2017, at which time he reported doing okay, had a recent trip to [place]. He was trying to stay active, felt like his right leg was weaker than the left. 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 He had seen a chiropractor for this about 3 years prior. He was taking melatonin  first jet lag. He was trying hydrate well. He felt that the Sinemet was working well for Sean Ward, reported no side effects, was taking 1-1/2 pills 3 times a day. I suggested a six-month follow-up.   I saw Sean Ward on 07/24/2016, at which time he reported tolerating the Sinemet and he felt that the tremor was less. He planned a trip to [place] for 6 days. He had no recent falls. He was not exercising on a regular basis. He reported a stable mood and memory. We talked about healthy lifestyle in the importance of exercise. I suggested he increase his Sinemet to 1-1/2 pills 3 times a day.     I first met Sean Ward on 06/06/2016 at the request of his primary care physician, at which time he reported an upper extremity tremor on the left frontal past 6-12 months and more recently in the right upper extremity. On examination, he had signs and keeping with parkinsonism, with left-sided predominance. I suggested we start Sean Ward on Sinemet 25-100 milligrams strength half a pill twice a day with gradual titration to 1 pill 3 times a day. I ordered a brain MRI wo contrast. He had this on 06/18/16: IMPRESSION:  This MRI of the brain without contrast shows the following: 1.    Scattered T2/FLAIR hyperintense foci in the subcortical, deep and periventricular white matter in a pattern most consistent with mild chronic microvascular ischemic change.   None of the foci appears to be acute. 2.    There are no acute findings.   We called Sean Ward with the results.    06/06/2016: He has had an upper extremity tremor for the past 6-12 months, unclear exactly how long and tremor started on the L, now also on the R side about a month ago. Leg tremors started about 2-3 months ago. 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 I reviewed your office note from 05/24/2016. You discontinued metoprolol and started Sean Ward on propranolol long-acting 60 mg once daily. He is physically active, but not so much lately. He plays tennis and rides a bike, but not able to  lately.  He was a Human resources officer, still does some contract work.  He drinks wine with dinner, 1-2 glasses. Tremor is less post alcohol. He drinks coffee about 3 cups per day, otherwise water, about 8 glasses.  He has swelling of both LEs, for the past 6+ months, was on Lasix, but no longer. He has been on a baby ASA.  He lives alone in an apartment and has to navigate the one flight of stairs. [place] once about 3 months ago, in his apartment onto his L side, no head injury, no LOC, sleeps more during the  day in the afternoon, which helps. He is separated (going through divorce), has one daughter and one son, both live in [place], [place].  He is a non-smoker, no FHx of tremors, or PD. He feels like he has a tendency to fall backwards. He has only fallen once in the past year, onto his left side, thankfully no injury as mentioned. Memory is fairly good, he denies any mood related issues, he does worry about his tremor. He is planning an overseas trip by the end of September.  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   History of bronchitis    Inguinal hernia, right 04/08/2014   Parkinson's disease ([facility])    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HCC)    Scoliosis of cervicothoracic spine    Scoliosis of thoracolumbar spine    Spondylosis of lumbar spine     His Past Surgical History Is Significant For: Past Surgical History:  Procedure Laterality Date   HERNIA REPAIR     TONSILLECTOMY      His Family History Is Significant For: Family History  Problem Relation Age of Onset   Deafness Mother    Deafness Sister    Heart disease Neg Hx     His Social History Is Significant For: Social History   Socioeconomic History   Marital status: Legally Separated    Spouse name: Not on file    Number of children: 2   Years of education: BA   Highest education level: Not on file  Occupational History   Not on file  Tobacco Use   Smoking status: Never Smoker   Smokeless tobacco: Never Used  Vaping Use   Vaping Use: Never used  Substance and Sexual Activity   Alcohol use: Yes    Alcohol/week: 7.0 standard drinks    Types: 7 Glasses of wine per week    Comment: 1 glass of red wine with dinner   Drug use: No   Sexual activity: Not Currently  Other Topics Concern   Not on file  Social History Narrative   Drinks 2 cups of coffee a day    Social Determinants of Health   Financial Resource Strain: Not on file  Food Insecurity: Not on file  Transportation Needs: Not on file  Physical Activity: Not on file  Stress: Not on file  Social Connections: Not on file    His Allergies Are:  No Known Allergies:   His Current Medications Are:  Outpatient Encounter Medications as of 10/19/2020  Medication Sig   carbidopa-levodopa (SINEMET CR) 50-200 MG tablet Take 1 tablet by mouth at bedtime.   apixaban (ELIQUIS) 5 MG TABS tablet TAKE 1 TABLET (5 MG TOTAL) BY MOUTH 2 (TWO) TIMES DAILY.   carbidopa-levodopa (SINEMET IR) 25-100 MG tablet Take 1.5 tablets by mouth 5 (five) times daily. Take 1 1/2 pills 5 times a day, at 8 am, 11 am, 2 PM, 5 pm and 8 PM.   furosemide (LASIX) 20 MG tablet Take 1 tablet (20 mg total) by mouth daily as needed (swelling or shortness of breath).   latanoprost (XALATAN) 0.005 % ophthalmic solution Place 1 drop into both eyes at bedtime.   Multiple Vitamin (MULTIVITAMIN) tablet Take 1 tablet by mouth daily.   propranolol ER (INDERAL LA) 60 MG 24 hr capsule Take 1 capsule (60  mg total) by mouth daily.   No facility-administered encounter medications on file as of 10/19/2020.  :  Review of Systems:  Out of a complete 14 point review of systems, all are reviewed and negative with the exception of these symptoms as listed below: Review of  Systems  Neurological:       Pt here for follow up today. States that things are ok. He states that sometimes he may miss taking 1 tab he would like to know what is recommended in that situation.     Objective:  Neurological Exam  Physical Exam Physical Examination:   Vitals:   10/19/20 0929  BP: 94/62  Pulse: 70    General Examination: The patient is a very pleasant 79 y.o. male in no acute distress. He appears somewhat deconditioned, good spirits.   HEENT:Normocephalic, atraumatic, pupils are equal, round and reactive to light, extraocular tracking saccadic breakdown. He has moderatefacial masking, very slight left ptosis. Neck rigidity is moderate. He has an intermittent lower lip and jaw tremor. He has no drooling. Hearing is grossly intact.Speech is moderately hypophonic, no obvious dysarthria noted today.    Chest:Clear to auscultation without wheezing, rhonchi or crackles noted.  Heart:S1+S2+0,irregularly irreg.  Abdomen:Soft, non-tender and non-distended with normal bowel sounds appreciated on auscultation.  Extremities:There istrace to 1+edema in thedistal lower extremities, left more than right.  Skin: Warm and dry without trophic changes noted.  Musculoskeletal: exam revealsright knee discomfort and he wears a brace around it.   Neurologically: Mental status: The patient is awake, alert and oriented in all 4 spheres. Hisimmediate and remote memory, attention, language skills and fund of knowledge are appropriate, but he does some slowness in thinking and word finding difficulties at times.  Thought process is linear. Mood is normaland affect are good.  Cranial nerves II - XII are as described above under HEENT exam.  Motor exam: Normal bulk, and strength for age, tone is mildly increased. He has no significant resting tremor today. Fine motor skills are moderately impaired on the left, slightly better on the right. He stands up with difficulty  and posture is moderately stooped, also has a forward tilt in the lower back.  He walks with mild difficulty, no walking aid.  He has a mild limp on the right.  Mild insecurity with turning. Balance is  mildly impaired but stable.    Assessment and Plan:  In summary, Jaysten Essner a very pleasant 79 year old male with an underlying medical history of bronchitis,a fib,osteoarthritis,and mildly overweight state, who presents for follow-up consultation of hisleft-sided predominant Parkinson's disease, with progression noted over time.  He has had decline in his mobility but also more issues with right knee arthritis.  He has had steroid injections and ultimately may need a knee replacement. He is reluctant to pursue this.  He has had some low back pain, particularly in the midline, sometimes to the left but no obvious radiation to the left leg.  Motor wise, he is fairly stable.  He is advised to continue with his generic Sinemet 1-1/2 pills 5 times a day as well as Sinemet CR at bedtime.  We increase the Sinemet in May of this year and also add at this ER at the time.  He is again encouraged to use a cane more consistently.  He feels he does not need a cane on a day-to-day basis. He had a brain MRI without contrast on 8/28/17which showed chronic white matter changes, no acute changes, no significant atrophy  for age. He is advised to follow-up in 6 months, sooner if needed. I answered all his questions today and he was in agreement.

## 2020-10-20 MED FILL — PROPRANOLOL ER 60 MG CAP: 60 | 30 days supply | Qty: 30 | Fill #2

## 2020-10-27 MED FILL — CARBIDOPA-LEVODOPA 25-100 T: 25-100 | 30 days supply | Qty: 225 | Fill #2

## 2020-10-28 ENCOUNTER — Other Ambulatory Visit: Payer: Self-pay | Admitting: Ophthalmology

## 2020-10-28 MED FILL — LATANOPROST 0.005% EYE DRP: 0.005 | 22 days supply | Qty: 3 | Fill #0

## 2020-11-01 ENCOUNTER — Other Ambulatory Visit: Payer: Self-pay | Admitting: Ophthalmology

## 2020-11-11 MED FILL — CARBIDOPA-LEVO ER 50-200 TA: 50-200 | 30 days supply | Qty: 30 | Fill #8

## 2020-11-22 ENCOUNTER — Telehealth: Payer: Self-pay | Admitting: Critical Care Medicine

## 2020-11-22 ENCOUNTER — Other Ambulatory Visit: Payer: Self-pay | Admitting: Critical Care Medicine

## 2020-11-22 ENCOUNTER — Ambulatory Visit: Payer: Medicare Other | Attending: Critical Care Medicine | Admitting: Critical Care Medicine

## 2020-11-22 ENCOUNTER — Other Ambulatory Visit: Payer: Self-pay

## 2020-11-22 ENCOUNTER — Encounter: Payer: Self-pay | Admitting: Critical Care Medicine

## 2020-11-22 VITALS — BP 114/73 | HR 73 | Resp 16 | Wt 179.0 lb

## 2020-11-22 DIAGNOSIS — R2681 Unsteadiness on feet: Secondary | ICD-10-CM | POA: Diagnosis not present

## 2020-11-22 DIAGNOSIS — G20 Parkinson's disease: Secondary | ICD-10-CM | POA: Diagnosis not present

## 2020-11-22 DIAGNOSIS — E785 Hyperlipidemia, unspecified: Secondary | ICD-10-CM

## 2020-11-22 DIAGNOSIS — I878 Other specified disorders of veins: Secondary | ICD-10-CM

## 2020-11-22 DIAGNOSIS — M1711 Unilateral primary osteoarthritis, right knee: Secondary | ICD-10-CM

## 2020-11-22 DIAGNOSIS — R251 Tremor, unspecified: Secondary | ICD-10-CM

## 2020-11-22 DIAGNOSIS — I4819 Other persistent atrial fibrillation: Secondary | ICD-10-CM | POA: Diagnosis not present

## 2020-11-22 MED ORDER — APIXABAN 5 MG PO TABS: ORAL_TABLET | ORAL | 1 refills | Status: DC

## 2020-11-22 MED ORDER — LATANOPROST 0.005 % OP SOLN: 1.0000 [drp] | Freq: Every day | OPHTHALMIC | 1 refills | Status: DC

## 2020-11-22 MED ORDER — CARBIDOPA-LEVODOPA ER 50-200 MG PO TBCR
1.0000 | EXTENDED_RELEASE_TABLET | Freq: Every day | ORAL | 3 refills | Status: DC
Start: 2020-11-22 — End: 2021-05-10

## 2020-11-22 MED ORDER — PROPRANOLOL HCL ER 60 MG PO CP24: 60.0000 mg | ORAL_CAPSULE | Freq: Every day | ORAL | 3 refills | Status: DC

## 2020-11-22 MED ORDER — CARBIDOPA-LEVODOPA 25-100 MG PO TABS: 1.5000 | ORAL_TABLET | Freq: Every day | ORAL | 3 refills | Status: DC

## 2020-11-22 MED FILL — PROPRANOLOL ER 60 MG CAP: 60 | 30 days supply | Qty: 30 | Fill #3

## 2020-11-22 NOTE — Assessment & Plan Note (Addendum)
Continue observation off lipid therapy

## 2020-11-22 NOTE — Assessment & Plan Note (Addendum)
Chronic Atrial fibrillation rate is stable Continue taking Eliquis and propranolol Refill propranolol for a 3 month supply due to patient traveling.

## 2020-11-22 NOTE — Assessment & Plan Note (Addendum)
Mild tremors at rest Continue taking sinemet

## 2020-11-22 NOTE — Assessment & Plan Note (Signed)
Unsteady gait due to parkinsonism Encouraged to use a cane and counseled regarding fall prevention.

## 2020-11-22 NOTE — Assessment & Plan Note (Addendum)
Encouraged to use mild-moderate compression stockings to improve circulation

## 2020-11-22 NOTE — Progress Notes (Signed)
Subjective:    Patient ID: Sean Ward, male    DOB: [birth year], 80 y.o.   MRN: [id number]  07/04/20 80 y.o.M PCP FULP The patient comes in today for work in visit and complains of a rash on the posterior right gluteal area and right posterior thigh for the past 1/2 weeks.  It is very pruritic and irritating.  He was uncertain whether an insect bite occurred.  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  Patient has parkinsonism is being followed closely by neurology and his Sinemet doses have been adjusted.  He denies any recent falls with this.  He has been recommended to receive a cane or rolling walker but he has declined this in the past.  He would like referrals to ophthalmology and dental care.  The patient states that he also has low back pain and is being evaluated by orthopedic surgeon and MRI is upcoming at this time.  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  With the course of acyclovir the area on the buttocks has dried up.  Patient comes in today also for follow-up of his parkinsonism chronic atrial fibrillation chronic arthritis in the right knee.  He does need a knee replacement on the right knee and he is trying to put this off as long as he can.  He does plan to travel to [place] and spent at least 6 months visiting relatives in [place].  Patient is taking his Sinemet for parkinsonism and has not had much change in this regard.  The patient's atrial fibrillation is well controlled with beta-blocker and he remains on chronic anticoagulant therapy.  All medications are able to be refilled. There are no other complaints  11/22/2020  Sean Ward is a 80 year old right-handed gentleman originally from  [place], with an underlying medical history of bronchitis, PAF, and osteoarthritis. The patient presents today for his routine follow-up with complaints of bilateral lower leg swelling and effusion of the rt knee. As directed by cardiology previously, the patient is taking Lasix and has compression stockings. The patient reports that he has had one recent fall not associated with his Parkinson's disease. Reports mild tremors at rest, but otherwise, his condition is stable with meds. Sean Ward says that he has an eye exam today with Dr. Nile Riggs for new eyeglasses.   Sean Ward will be traveling to [place] for six months to see his relatives and wants to receive the covid booster vaccine.   Needs: Covid Booster   Past Medical History:  Diagnosis Date  . Anticoagulant long-term use   . Back pain with radiation   . Hernia, inguinal, left 05/03/2014  . 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 History of bronchitis   . Inguinal hernia, right 04/08/2014  . Parkinson's disease (HCC)   . 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 Scoliosis of cervicothoracic spine   . Scoliosis of thoracolumbar spine   . Spondylosis of lumbar spine      Family History  Problem Relation Age of Onset  . Deafness Mother   . Deafness Sister   . Heart disease Neg Hx      Social History   Socioeconomic History  . Marital status: Legally Separated  Spouse name: Not on file  . Number of children: 2  . Years of education: BA  . Highest education level: Not on file  Occupational History  . Not on file  Tobacco Use  . Smoking status: Never Smoker  . Smokeless tobacco: Never Used  Vaping Use  . Vaping Use: Never used  Substance and Sexual Activity  . Alcohol use: Yes    Alcohol/week: 7.0 standard drinks    Types: 7 Glasses of wine per week    Comment: 1 glass of red wine with dinner  . Drug use: No  . Sexual activity: Not Currently  Other Topics Concern  . Not on file  Social History  Narrative   Drinks 2 cups of coffee a day    Social Determinants of Health   Financial Resource Strain: Not on file  Food Insecurity: Not on file  Transportation Needs: Not on file  Physical Activity: Not on file  Stress: Not on file  Social Connections: Not on file  Intimate Partner Violence: Not on file     No Known Allergies   Outpatient Medications Prior to Visit  Medication Sig Dispense Refill  . apixaban (ELIQUIS) 5 MG TABS tablet TAKE 1 TABLET (5 MG TOTAL) BY MOUTH 2 (TWO) TIMES DAILY. 60 tablet 6  . carbidopa-levodopa (SINEMET CR) 50-200 MG tablet Take 1 tablet by mouth at bedtime. 90 tablet 3  . carbidopa-levodopa (SINEMET IR) 25-100 MG tablet Take 1.5 tablets by mouth 5 (five) times daily. Take 1 1/2 pills 5 times a day, at 8 am, 11 am, 2 PM, 5 pm and 8 PM. 680 tablet 3  . latanoprost (XALATAN) 0.005 % ophthalmic solution Place 1 drop into both eyes at bedtime.    . Multiple Vitamin (MULTIVITAMIN) tablet Take 1 tablet by mouth daily.    . propranolol ER (INDERAL LA) 60 MG 24 hr capsule Take 1 capsule (60 mg total) by mouth daily. 90 capsule 3  . methocarbamol (ROBAXIN) 500 MG tablet Take by mouth.    . furosemide (LASIX) 20 MG tablet Take 1 tablet (20 mg total) by mouth daily as needed (swelling or shortness of breath). (Patient not taking: Reported on 11/22/2020) 90 tablet 3   No facility-administered medications prior to visit.    Review of Systems  Constitutional: Negative.   HENT: Negative.   Eyes: Negative.   Respiratory: Negative.   Cardiovascular: Negative.   Gastrointestinal: Negative.   Genitourinary: Negative.   Musculoskeletal: Positive for back pain, gait problem and joint swelling.  Skin: Positive for rash.  Neurological: Positive for tremors and weakness. Negative for dizziness, seizures, facial asymmetry, speech difficulty, light-headedness and numbness.  Psychiatric/Behavioral: Negative.        Objective:   Physical Exam HENT:     Head:  Normocephalic.     Nose: No congestion or rhinorrhea.     Mouth/Throat:     Mouth: Mucous membranes are moist.  Eyes:     Extraocular Movements: Extraocular movements intact.     Pupils: Pupils are equal, round, and reactive to light.  Cardiovascular:     Pulses: Normal pulses.     Heart sounds: Normal heart sounds.  Pulmonary:     Effort: Pulmonary effort is normal.     Breath sounds: Normal breath sounds.  Abdominal:     General: Bowel sounds are normal.     Palpations: Abdomen is soft.  Musculoskeletal:     Cervical back: Normal range of motion.     Comments: unstable  gait associated with parkinsonism  Skin:    General: Skin is warm and dry.  Neurological:     General: No focal deficit present.     Mental Status: He is alert and oriented to person, place, and time.        Assessment & Plan:  I personally reviewed all images and lab data in the [facility] system as well as any outside material available during this office visit and agree with the  radiology impressions.  Parkinson disease (HCC) Stable Continue Sinemet Encouraged to use a cane for safety  Unsteady gait Unsteady gait due to parkinsonism Encouraged to use a cane and counseled regarding fall prevention.  Tremor Mild tremors at rest Continue taking sinemet   Venous stasis Encouraged to use mild-moderate compression stockings to improve circulation  Atrial fibrillation Chronic Atrial fibrillation rate is stable Continue taking Eliquis and propranolol Refill propranolol for a 3 month supply due to patient traveling.  Primary osteoarthritis of right knee Continue to follow up with orthopedics for chronic rt knee pain and effusion.   Dyslipidemia Continue observation off lipid therapy   Covid Booster Recommended getting covid booster at [facility] or [facility] Recommended a KN95 mask  Angie was seen today for follow-up.  Diagnoses and all orders for this visit:  Parkinson disease (HCC)  Unsteady  gait  Tremor  Venous stasis  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HCC)  Primary osteoarthritis of right knee  Dyslipidemia   f/u in 3 months

## 2020-11-22 NOTE — Progress Notes (Signed)
Follow up.

## 2020-11-22 NOTE — Assessment & Plan Note (Addendum)
Stable Continue Sinemet Encouraged to use a cane for safety

## 2020-11-22 NOTE — Patient Instructions (Addendum)
Please get your Covid Booster  [facility] or [facility] COVID-19 Vaccine Information can be found at: [email or web address] For questions related to vaccine distribution or appointments, please email [email or web address] or call [phone].   Consider a KN95 mask  You can get a Covid test at [place]/testing  No change in medications  Follow up with orthopedics on knee issue as needed  Use a cane to walk long distance ( or walking stick)  Use compression socks for lower leg swelling:  Moderate compression

## 2020-11-22 NOTE — Assessment & Plan Note (Addendum)
Continue to follow up with orthopedics for chronic rt knee pain and effusion.

## 2020-11-22 NOTE — Telephone Encounter (Signed)
Spoke to patient regarding the Covid vaccine.  Wanted to know if there is a charge.  Advised to call pharmacy to double check when he made his appt. But did not think there was a charge.

## 2020-11-22 NOTE — Telephone Encounter (Signed)
Patient call in requesting a call from Dr. Delford Field or the nurse. Patient has questions about the Covid vaccine.

## 2020-11-24 ENCOUNTER — Ambulatory Visit: Payer: Medicare Other | Admitting: Critical Care Medicine

## 2020-11-28 MED FILL — CARBIDOPA-LEVODOPA 25-100 T: 25-100 | 180 days supply | Qty: 1350 | Fill #2

## 2020-12-07 ENCOUNTER — Other Ambulatory Visit (HOSPITAL_COMMUNITY): Payer: Self-pay | Admitting: Internal Medicine

## 2020-12-07 ENCOUNTER — Ambulatory Visit: Payer: Medicare Other | Attending: Internal Medicine

## 2020-12-07 DIAGNOSIS — Z23 Encounter for immunization: Secondary | ICD-10-CM

## 2020-12-14 NOTE — Progress Notes (Unsigned)
Cardiology Office Note   Date:  12/15/2020   ID:  Sean Ward, DOB 07-20-1941, MRN [id number]  PCP:  [name], MD    No chief complaint on file.  AFib  Wt Readings from Last 3 Encounters:  12/15/20 177 lb 3.2 oz (80.4 kg)  11/22/20 179 lb (81.2 kg)  10/19/20 174 lb (78.9 kg)       History of Present Illness: Sean Ward is a 80 y.o. male   who was diagnosed with atrial fibrillation in January 2016. He [facility]. He was asymptomatic when he initially was diagnosed with a heart rate of 115 220 bpm. His chads score was only 1 and therefore he was not started on anticoagulation. He was started on full dose aspirin. He was treated with Lasix for edema, but this did not seem to help the edema which was thought to be due to venous stasis and varicose veins.Compression stockings have helped.  He has Parkinsons sx. He is benefitting from the medication.  In the past, aspirin was stopped and Eliquis was started.  He has had mild left leg edema chronically.  He had a rough 2020.  He lost transportation, could not sleep and became depressed.  He had some edema-  Lasix was prescribed.  He had some improvement.   Knee swelling on the right.  Exercise is limited due to knee problems.   Denies : Chest pain. Dizziness. Leg edema. Nitroglycerin use. Orthopnea. Palpitations. Paroxysmal nocturnal dyspnea. Shortness of breath. Syncope.   Got his COVID booster shot.     Past Medical History:  Diagnosis Date  . Anticoagulant long-term use   . Back pain with radiation   . Hernia, inguinal, left 05/03/2014  . 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 History of bronchitis   . Inguinal hernia, right 04/08/2014  . Parkinson's disease (HCC)   . 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 Scoliosis of cervicothoracic spine   . Scoliosis of thoracolumbar spine   . Spondylosis of lumbar spine     Past  Surgical History:  Procedure Laterality Date  . HERNIA REPAIR    . TONSILLECTOMY       Current Outpatient Medications  Medication Sig Dispense Refill  . apixaban (ELIQUIS) 5 MG TABS tablet TAKE 1 TABLET (5 MG TOTAL) BY MOUTH 2 (TWO) TIMES DAILY. 180 tablet 1  . carbidopa-levodopa (SINEMET CR) 50-200 MG tablet Take 1 tablet by mouth at bedtime. 90 tablet 3  . carbidopa-levodopa (SINEMET IR) 25-100 MG tablet Take 1.5 tablets by mouth 5 (five) times daily. Take 1 1/2 pills 5 times a day, at 8 am, 11 am, 2 PM, 5 pm and 8 PM. 680 tablet 3  . latanoprost (XALATAN) 0.005 % ophthalmic solution Place 1 drop into both eyes at bedtime. 7.5 mL 1  . Multiple Vitamin (MULTIVITAMIN) tablet Take 1 tablet by mouth daily.    . propranolol ER (INDERAL LA) 60 MG 24 hr capsule Take 1 capsule (60 mg total) by mouth daily. 90 capsule 3   No current facility-administered medications for this visit.    Allergies:   Patient has no known allergies.    Social History:  The patient  reports that he has never smoked. He has never used smokeless tobacco. He reports current alcohol use of about 7.0 standard drinks of alcohol per week. He reports that he does not use drugs.   Family History:  The  patient's family history includes Deafness in his mother and sister.    ROS:  Please see the history of present illness.   Otherwise, review of systems are positive for knee pain.   All other systems are reviewed and negative.    PHYSICAL EXAM: VS:  BP 106/74   Pulse 83   Ht 5\' 8"  (1.727 m)   Wt 177 lb 3.2 oz (80.4 kg)   SpO2 98%   BMI 26.94 kg/m  , BMI Body mass index is 26.94 kg/m. GEN: Well nourished, well developed, in no acute distress  HEENT: normal  Neck: no JVD, carotid bruits, or masses Cardiac: irregularly irregular; no murmurs, rubs, or gallops,no edema  Respiratory:  clear to auscultation bilaterally, normal work of breathing GI: soft, nontender, nondistended, + BS MS: no deformity or atrophy  Skin:  warm and dry, no rash Neuro:  Strength and sensation are intact Psych: euthymic mood, full affect   EKG:   The ekg ordered today demonstrates AFib, rate controlled   Recent Labs: 12/22/2019: ALT 15; BNP 357.5; Hemoglobin 14.1; Platelets 243 12/31/2019: BUN 23; Creatinine, Ser 1.13; Potassium 4.4; Sodium 142   Lipid Panel    Component Value Date/Time   CHOL 165 06/01/2020 1529   TRIG 91 06/01/2020 1529   HDL 51 06/01/2020 1529   CHOLHDL 3.2 06/01/2020 1529   CHOLHDL 3.5 08/16/2016 1038   VLDL 24 08/16/2016 1038   LDLCALC 97 06/01/2020 1529     Other studies Reviewed: Additional studies/ records that were reviewed today with results demonstrating: .   ASSESSMENT AND PLAN:  1. PAF: In Afib, rate controlled.  No bleeding on Eliquis. COntiue plan for rate control and anticoagulation.  2. LE edema: Elevate legs.  Some edema from knee arthritis.  3. Fall risk: Multifactorial.   4. Check CBC and BMet today due to Eliquis   Current medicines are reviewed at length with the patient today.  The patient concerns regarding his medicines were addressed.  The following changes have been made:  No change  Labs/ tests ordered today include:   Orders Placed This Encounter  Procedures  . CBC  . Basic Metabolic Panel (BMET)  . EKG 12-Lead    Recommend 150 minutes/week of aerobic exercise Low fat, low carb, high fiber diet recommended  Disposition:   FU in 1 year   Signed, 08/01/2020, MD  12/15/2020 1:07 PM    [facility] [address] [place], [place], [place]  [place] Phone: [phone]; Fax: [phone]

## 2020-12-15 ENCOUNTER — Ambulatory Visit (INDEPENDENT_AMBULATORY_CARE_PROVIDER_SITE_OTHER): Payer: Medicare Other | Admitting: Interventional Cardiology

## 2020-12-15 ENCOUNTER — Other Ambulatory Visit: Payer: Self-pay

## 2020-12-15 ENCOUNTER — Encounter: Payer: Self-pay | Admitting: Interventional Cardiology

## 2020-12-15 VITALS — BP 106/74 | HR 83 | Ht 68.0 in | Wt 177.2 lb

## 2020-12-15 DIAGNOSIS — Z9181 History of falling: Secondary | ICD-10-CM

## 2020-12-15 DIAGNOSIS — I4819 Other persistent atrial fibrillation: Secondary | ICD-10-CM | POA: Diagnosis not present

## 2020-12-15 DIAGNOSIS — R600 Localized edema: Secondary | ICD-10-CM | POA: Diagnosis not present

## 2020-12-15 MED FILL — CARBIDOPA-LEVO ER 50-200 TA: 50-200 | 30 days supply | Qty: 30 | Fill #9

## 2020-12-15 NOTE — Patient Instructions (Signed)
Medication Instructions:  Your physician recommends that you continue on your current medications as directed. Please refer to the Current Medication list given to you today.  *If you need a refill on your cardiac medications before your next appointment, please call your pharmacy*   Lab Work: Lab work to be done today--CBC and BMP If you have labs (blood work) drawn today and your tests are completely normal, you will receive your results only by: Marland Kitchen MyChart Message (if you have MyChart) OR . A paper copy in the mail If you have any lab test that is abnormal or we need to change your treatment, we will call you to review the results.   Testing/Procedures: none   Follow-Up: At [facility], you and your health needs are our priority.  As part of our continuing mission to provide you with exceptional heart care, we have created designated Provider Care Teams.  These Care Teams include your primary Cardiologist (physician) and Advanced Practice Providers (APPs -  Physician Assistants and Nurse Practitioners) who all work together to provide you with the care you need, when you need it.  We recommend signing up for the patient portal called "MyChart".  Sign up information is provided on this After Visit Summary.  MyChart is used to connect with patients for Virtual Visits (Telemedicine).  Patients are able to view lab/test results, encounter notes, upcoming appointments, etc.  Non-urgent messages can be sent to your provider as well.   To learn more about what you can do with MyChart, go to [email or web address].    Your next appointment:   12 month(s)  The format for your next appointment:   In Person  Provider:   You may see Lance Muss, MD or one of the following Advanced Practice Providers on your designated Care Team:    Ronie Spies, PA-C  Jacolyn Reedy, PA-C    Other Instructions  Elevate legs to help with swelling

## 2020-12-16 LAB — BASIC METABOLIC PANEL
BUN/Creatinine Ratio: 20 (ref 10–24)
BUN: 23 mg/dL (ref 8–27)
CO2: 23 mmol/L (ref 20–29)
Calcium: 8.7 mg/dL (ref 8.6–10.2)
Chloride: 106 mmol/L (ref 96–106)
Creatinine, Ser: 1.16 mg/dL (ref 0.76–1.27)
GFR calc Af Amer: 69 mL/min/{1.73_m2} (ref >59)
GFR calc non Af Amer: 60 mL/min/{1.73_m2} (ref >59)
Glucose: 101 mg/dL — ABNORMAL HIGH (ref 65–99)
Potassium: 4.4 mmol/L (ref 3.5–5.2)
Sodium: 142 mmol/L (ref 134–144)

## 2020-12-16 LAB — CBC
Hematocrit: 39.1 % (ref 37.5–51.0)
Hemoglobin: 13.2 g/dL (ref 13.0–17.7)
MCH: 30.3 pg (ref 26.6–33.0)
MCHC: 33.8 g/dL (ref 31.5–35.7)
MCV: 90 fL (ref 79–97)
Platelets: 228 10*3/uL (ref 150–450)
RBC: 4.36 x10E6/uL (ref 4.14–5.80)
RDW: 13.3 % (ref 11.6–15.4)
WBC: 5.8 10*3/uL (ref 3.4–10.8)

## 2020-12-21 MED FILL — PROPRANOLOL HCL ER 60 MG CP: 60 | 30 days supply | Qty: 30 | Fill #4

## 2021-01-09 MED FILL — ELIQUIS 5 MG TABLET: 5 | 30 days supply | Qty: 60 | Fill #0

## 2021-01-09 MED FILL — CARBIDOPA-LEVO ER 50-200 TA: 50-200 | 30 days supply | Qty: 30 | Fill #10

## 2021-01-23 ENCOUNTER — Other Ambulatory Visit: Payer: Self-pay

## 2021-01-23 MED FILL — Propranolol HCl Cap ER 24HR 60 MG: ORAL | 30 days supply | Qty: 30 | Fill #0 | Status: AC

## 2021-01-25 ENCOUNTER — Other Ambulatory Visit: Payer: Self-pay

## 2021-02-13 NOTE — Progress Notes (Deleted)
Subjective:    Patient ID: Sean Ward, male    DOB: 30-Oct-1940, 80 y.o.   MRN: [id number]  07/04/20 80 y.o.M PCP FULP The patient comes in today for work in visit and complains of a rash on the posterior right gluteal area and right posterior thigh for the past 1/2 weeks.  It is very pruritic and irritating.  He was uncertain whether an insect bite occurred.  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  Patient has parkinsonism is being followed closely by neurology and his Sinemet doses have been adjusted.  He denies any recent falls with this.  He has been recommended to receive a cane or rolling walker but he has declined this in the past.  He would like referrals to ophthalmology and dental care.  The patient states that he also has low back pain and is being evaluated by orthopedic surgeon and MRI is upcoming at this time.  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  With the course of acyclovir the area on the buttocks has dried up.  Patient comes in today also for follow-up of his parkinsonism chronic atrial fibrillation chronic arthritis in the right knee.  He does need a knee replacement on the right knee and he is trying to put this off as long as he can.  He does plan to travel to [place] and spent at least 6 months visiting relatives in [place].  Patient is taking his Sinemet for parkinsonism and has not had much change in this regard.  The patient's atrial fibrillation is well controlled with beta-blocker and he remains on chronic anticoagulant therapy.  All medications are able to be refilled. There are no other complaints  11/22/2020  Sean Ward is a 80 year old right-handed gentleman originally from  [place], with an underlying medical history of bronchitis, PAF, and osteoarthritis. The patient presents today for his routine follow-up with complaints of bilateral lower leg swelling and effusion of the rt knee. As directed by cardiology previously, the patient is taking Lasix and has compression stockings. The patient reports that he has had one recent fall not associated with his Parkinson's disease. Reports mild tremors at rest, but otherwise, his condition is stable with meds. Sean Ward says that he has an eye exam today with Dr. Nile Riggs for new eyeglasses.   Sean Ward will be traveling to [place] for six months to see his relatives and wants to receive the covid booster vaccine.   Needs: Covid Booster  02/20/21 Parkinson disease (HCC) Stable Continue Sinemet Encouraged to use a cane for safety  Unsteady gait Unsteady gait due to parkinsonism Encouraged to use a cane and counseled regarding fall prevention.  Tremor Mild tremors at rest Continue taking sinemet   Venous stasis Encouraged to use mild-moderate compression stockings to improve circulation  Atrial fibrillation Chronic Atrial fibrillation rate is stable Continue taking Eliquis and propranolol Refill propranolol for a 3 month supply due to patient traveling.  Primary osteoarthritis of right knee Continue to follow up with orthopedics for chronic rt knee pain and effusion.   Dyslipidemia Continue observation off lipid therapy   Covid Booster Recommended getting cov[facility] booster at [facility] or [facility] Recommended a KN95 mask  Sean Ward was seen today  for follow-up.  Diagnoses and all orders for this visit:  Parkinson disease (HCC)  Unsteady gait  Tremor  Venous stasis  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HCC)  Primary osteoarthritis of right knee  Dyslipidemia   f/u in 3 month   Past Medical History:  Diagnosis Date  . Anticoagulant long-term use   . Back pain with radiation   . Hernia, inguinal,  left 05/03/2014  . 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 History of bronchitis   . Inguinal hernia, right 04/08/2014  . Parkinson's disease (HCC)   . 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 Scoliosis of cervicothoracic spine   . Scoliosis of thoracolumbar spine   . Spondylosis of lumbar spine      Family History  Problem Relation Age of Onset  . Deafness Mother   . Deafness Sister   . Heart disease Neg Hx      Social History   Socioeconomic History  . Marital status: Legally Separated    Spouse name: Not on file  . Number of children: 2  . Years of education: BA  . Highest education level: Not on file  Occupational History  . Not on file  Tobacco Use  . Smoking status: Never Smoker  . Smokeless tobacco: Never Used  Vaping Use  . Vaping Use: Never used  Substance and Sexual Activity  . Alcohol use: Yes    Alcohol/week: 7.0 standard drinks    Types: 7 Glasses of wine per week    Comment: 1 glass of red wine with dinner  . Drug use: No  . Sexual activity: Not Currently  Other Topics Concern  . Not on file  Social History Narrative   Drinks 2 cups of coffee a day    Social Determinants of Health   Financial Resource Strain: Not on file  Food Insecurity: Not on file  Transportation Needs: Not on file  Physical Activity: Not on file  Stress: Not on file  Social Connections: Not on file  Intimate Partner Violence: Not on file     No Known Allergies   Outpatient Medications Prior to Visit  Medication Sig Dispense Refill  . apixaban (ELIQUIS) 5 MG TABS tablet TAKE 1 TABLET (5 MG TOTAL) BY MOUTH 2 (TWO) TIMES DAILY. 180 tablet 1  . carbidopa-levodopa (SINEMET CR) 50-200 MG tablet Take 1 tablet by mouth at bedtime. 90 tablet 3  . carbidopa-levodopa (SINEMET IR) 25-100 MG tablet Take 1.5 tablets by mouth 5 (five) times daily. Take 1 1/2 pills 5 times a day, at 8 am, 11 am, 2 PM, 5 pm and 8 PM. 680 tablet 3  . latanoprost (XALATAN) 0.005 % ophthalmic  solution Place 1 drop into both eyes at bedtime. 7.5 mL 1  . Multiple Vitamin (MULTIVITAMIN) tablet Take 1 tablet by mouth daily.    . propranolol ER (INDERAL LA) 60 MG 24 hr capsule Take 1 capsule (60 mg total) by mouth daily. 90 capsule 3   No facility-administered medications prior to visit.    Review of Systems  Constitutional: Negative.   HENT: Negative.   Eyes: Negative.   Respiratory: Negative.   Cardiovascular: Negative.   Gastrointestinal: Negative.   Genitourinary: Negative.   Musculoskeletal: Positive for back pain, gait problem and joint swelling.  Skin: Positive for rash.  Neurological: Positive for tremors and weakness. Negative for dizziness, seizures, facial asymmetry, speech difficulty, light-headedness and numbness.  Psychiatric/Behavioral: Negative.        Objective:   Physical Exam HENT:  Head: Normocephalic.     Nose: No congestion or rhinorrhea.     Mouth/Throat:     Mouth: Mucous membranes are moist.  Eyes:     Extraocular Movements: Extraocular movements intact.     Pupils: Pupils are equal, round, and reactive to light.  Cardiovascular:     Pulses: Normal pulses.     Heart sounds: Normal heart sounds.  Pulmonary:     Effort: Pulmonary effort is normal.     Breath sounds: Normal breath sounds.  Abdominal:     General: Bowel sounds are normal.     Palpations: Abdomen is soft.  Musculoskeletal:     Cervical back: Normal range of motion.     Comments: unstable gait associated with parkinsonism  Skin:    General: Skin is warm and dry.  Neurological:     General: No focal deficit present.     Mental Status: He is alert and oriented to person, place, and time.        Assessment & Plan:  I personally reviewed all images and lab data in the [facility] system as well as any outside material available during this office visit and agree with the  radiology impressions.  No problem-specific Assessment & Plan notes found for this encounter.  Covid  Booster Recommended [facility] Recommended a KN95 mask  There are no diagnoses linked to this encounter. f/u in 3 months

## 2021-02-14 MED FILL — Apixaban Tab 5 MG: ORAL | 30 days supply | Qty: 60 | Fill #0 | Status: AC

## 2021-02-15 ENCOUNTER — Other Ambulatory Visit: Payer: Self-pay

## 2021-02-15 MED FILL — Carbidopa & Levodopa Tab ER 50-200 MG: ORAL | 30 days supply | Qty: 30 | Fill #0 | Status: AC

## 2021-02-20 ENCOUNTER — Ambulatory Visit: Payer: Medicare Other | Admitting: Critical Care Medicine

## 2021-02-20 ENCOUNTER — Other Ambulatory Visit: Payer: Self-pay

## 2021-02-21 ENCOUNTER — Other Ambulatory Visit: Payer: Self-pay

## 2021-02-21 MED FILL — Propranolol HCl Cap ER 24HR 60 MG: ORAL | 30 days supply | Qty: 30 | Fill #0 | Status: AC

## 2021-03-13 ENCOUNTER — Other Ambulatory Visit: Payer: Self-pay

## 2021-03-13 MED FILL — Carbidopa & Levodopa Tab ER 50-200 MG: ORAL | 30 days supply | Qty: 30 | Fill #0 | Status: AC

## 2021-03-14 ENCOUNTER — Other Ambulatory Visit: Payer: Self-pay

## 2021-03-14 MED FILL — Apixaban Tab 5 MG: ORAL | 30 days supply | Qty: 60 | Fill #1 | Status: AC

## 2021-03-24 ENCOUNTER — Other Ambulatory Visit: Payer: Self-pay

## 2021-03-24 MED FILL — Propranolol HCl Cap ER 24HR 60 MG: ORAL | 30 days supply | Qty: 30 | Fill #1 | Status: AC

## 2021-04-12 ENCOUNTER — Other Ambulatory Visit: Payer: Self-pay

## 2021-04-12 MED FILL — Apixaban Tab 5 MG: ORAL | 30 days supply | Qty: 60 | Fill #2 | Status: AC

## 2021-04-12 MED FILL — Carbidopa & Levodopa Tab ER 50-200 MG: ORAL | 30 days supply | Qty: 30 | Fill #1 | Status: AC

## 2021-04-13 ENCOUNTER — Telehealth: Payer: Medicare Other | Admitting: Critical Care Medicine

## 2021-04-14 ENCOUNTER — Other Ambulatory Visit: Payer: Self-pay

## 2021-04-19 ENCOUNTER — Ambulatory Visit: Payer: Medicare Other | Admitting: Neurology

## 2021-04-19 ENCOUNTER — Encounter: Payer: Self-pay | Admitting: Neurology

## 2021-04-19 VITALS — BP 106/70 | HR 62 | Ht 69.0 in | Wt 176.0 lb

## 2021-04-19 DIAGNOSIS — G20 Parkinson's disease: Secondary | ICD-10-CM

## 2021-04-19 DIAGNOSIS — R600 Localized edema: Secondary | ICD-10-CM | POA: Diagnosis not present

## 2021-04-19 NOTE — Progress Notes (Signed)
Subjective:    Ward ID: Sean Ward is a 80 y.o. male.  HPI    Interim history:   Sean Ward is a 80 year old right-handed gentleman with an underlying medical history of bronchitis, PAF and osteoarthritis, who presents for follow-up consultation of his parkinsonism. Sean Ward is unaccompanied today.  I last saw Sean Ward on 10/19/2020, at which time Sean Ward reported feeling fairly stable.  Sean Ward had not experienced any recent falls.  Sean Ward complained of back pain and saw his orthopedic specialist who recommended an MRI but Sean Ward had not pursued it yet.  Sean Ward was planning to travel to [place] soon.  Sean Ward was taking Sinemet 1-1/2 pills 5 times a day as well as Sinemet CR at bedtime.  Sean Ward was advised to continue with his medication regimen.  Sean Ward was reminded to use his cane consistently.  Today, 04/19/2021: Sean Ward reports feeling fairly stable.  Sean Ward continues to have low back discomfort.  Sean Ward tries to stay active.  Sean Ward tries to hydrate well with water but also likes to drink hot tea.  Sean Ward sees his cardiologist typically once a year.  Sean Ward has not fallen thankfully.  Sean Ward feels that Sean medication regimen is working fairly well, takes Sinemet generic 1-1/2 pills 5 times a day, and Sinemet CR 1 pill at bedtime around 10 PM.  Last IR dose is around 8 PM.  Sean Ward has not experienced much in Sean way of constipation.  Sean Ward is planning a trip to [place] in September.  Sean Ward tries to eat a fiber rich diet.  Sean Ward has noticed more swelling in that extremity, more so on Sean left.  Small, episodes of choking.  Sean Ward has an appointment with his primary care in about 2 weeks.   Sean Ward's allergies, current medications, family history, past medical history, past social history, past surgical history and problem list were reviewed and updated as appropriate.   Previously (copied from previous notes for reference):   I saw Sean Ward on 06/15/2020, at which time Sean Ward reported feeling fairly stable.  Sean Ward was on Sinemet 1-1/2 pills 5 times a day and Sinemet CR 1 pill at  bedtime.  Sean Ward had right knee pain.  Sean Ward had received cortisone injections.  Sean Ward had seen orthopedics.  Sean Ward was encouraged to use a cane for safety.  Sean Ward was contemplating moving to [place] or staying there for 6 months at a time.   I saw Sean Ward on 03/09/2020 after a longer gap of over 1-1/2 years.  Sean Ward had a recent fall.  Sean Ward reported feeling worse.  His exam showed progression.  Sean Ward was advised to increase his Sinemet to 1-1/2 pills 5 times a day and also add a Sinemet CR at bedtime.  Furthermore, Sean Ward was advised to increase his melatonin to 5 mg at night. Sean Ward was advised to start using a cane consistently and consider using a walker.       Sean Ward missed a virtual visit appointment on 03/12/2019 and a face to face appointment on 05/07/19. I saw Sean Ward on 08/27/2018, at which time Sean Ward reported more tremor in Sean mornings, on Sean left side. Sean Ward was keeping his house very cold, does not like to use Sean heat in Sean house and it has been in Sean 40s in Sean house in Sean mornings. Sean Ward was supposed to see orthopedics for left-sided low back pain with radiation to Sean left thigh.       I saw Sean Ward on 02/24/2018, at which time Sean Ward reported doing okay, thankfully no falls. Sean Ward  reported occasional dizziness and lightheadedness. His blood pressure was trending lower. Sean Ward was tolerating Sean Eliquis, has had lower extremity swelling, also right knee pain. Sean Ward was advised by cardiology to use compression socks, but was not wearing them regularly. Sean Ward was planning a trip to [place]. Sean Ward was advised to continue with Sinemet 1-1/2 pills 4 times a day.   (Sean Ward) had to miss an appointment on 02/13/2018. I saw Sean Ward on 08/15/2017, at which time Sean Ward had no acute issues but overall was not as active. His balance was not as good. Sean Ward felt that his legs are heavier. Sean Ward was taking Sinemet one and half pill 3 times a day, I suggested we increase it to 4 times a day. His heart rate and cardiac auscultation suggested significant irregularities and I suggested Sean Ward follow-up  with cardiology.   Of note, Sean Ward missed an appointment on 08/14/2017. I saw Sean Ward on 02/12/2017, at which time Sean Ward reported doing okay, had a recent trip to [place]. Sean Ward was trying to stay active, felt like his right leg was weaker than Sean left. 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 Sean Ward had seen a chiropractor for this about 3 years prior. Sean Ward was taking melatonin first jet lag. Sean Ward was trying hydrate well. Sean Ward felt that Sean Sinemet was working well for Sean Ward, reported no side effects, was taking 1-1/2 pills 3 times a day. I suggested a six-month follow-up.   I saw Sean Ward on 07/24/2016, at which time Sean Ward reported tolerating Sean Sinemet and Sean Ward felt that Sean tremor was less. Sean Ward planned a trip to [place] for 6 days. Sean Ward had no recent falls. Sean Ward was not exercising on a regular basis. Sean Ward reported a stable mood and memory. We talked about healthy lifestyle in Sean importance of exercise. I suggested Sean Ward increase his Sinemet to 1-1/2 pills 3 times a day.     I first met Sean Ward on 06/06/2016 at Sean request of his primary care physician, at which time Sean Ward reported an upper extremity tremor on Sean left frontal past 6-12 months and more recently in Sean right upper extremity. On examination, Sean Ward had signs and keeping with parkinsonism, with left-sided predominance. I suggested we start Sean Ward on Sinemet 25-100 milligrams strength half a pill twice a day with gradual titration to 1 pill 3 times a day. I ordered a brain MRI wo contrast. Sean Ward had this on 06/18/16: IMPRESSION:  This MRI of Sean brain without contrast shows Sean following: 1.    Scattered T2/FLAIR hyperintense foci in Sean subcortical, deep and periventricular white matter in a pattern most consistent with mild chronic microvascular ischemic change.   None of Sean foci appears to be acute. 2.    There are no acute findings.   We called Sean Ward with Sean results.   06/06/2016: Sean Ward has had an upper extremity tremor for Sean past 6-12  months, unclear exactly how long and tremor started on Sean L, now also on Sean R side about a month ago. Leg tremors started about 2-3 months ago. 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 I reviewed your office note from 05/24/2016. You discontinued metoprolol and started Sean Ward on propranolol long-acting 60 mg once daily. Sean Ward is physically active, but not so much lately. Sean Ward plays tennis and rides a bike, but not able to lately. 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 Sean Ward drinks wine with dinner, 1-2 glasses. Tremor is less post alcohol.  Sean Ward drinks coffee about 3 cups per day, otherwise water, about 8 glasses. Sean Ward has swelling of both LEs, for Sean past 6+ months, was on Lasix, but no longer. Sean Ward has been on a baby ASA. Sean Ward lives alone in an apartment and has to navigate Sean one flight of stairs. [place] once about 3 months ago, in his apartment onto his L side, no head injury, no LOC, sleeps more during Sean day in Sean afternoon, which helps. Sean Ward is separated (going through divorce), has one daughter and one son, both live in [place], [place]. Sean Ward is a non-smoker, no FHx of tremors, or PD. Sean Ward feels like Sean Ward has a tendency to fall backwards. Sean Ward has only fallen once in Sean past year, onto his left side, thankfully no injury as mentioned. Memory is fairly good, Sean Ward denies any mood related issues, Sean Ward does worry about his tremor. Sean Ward is planning an overseas trip by Sean end of September.     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   History of bronchitis    Inguinal hernia, right 04/08/2014   Parkinson's disease ([place])    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HCC)    Scoliosis of cervicothoracic spine    Scoliosis of thoracolumbar spine    Spondylosis of lumbar spine     His Past Surgical History Is Significant  For: Past Surgical History:  Procedure Laterality Date   HERNIA REPAIR     TONSILLECTOMY      His Family History Is Significant For: Family History  Problem Relation Age of Onset   Deafness Mother    Deafness Sister    Heart disease Neg Hx     His Social History Is Significant For: Social History   Socioeconomic History   Marital status: Legally Separated    Spouse name: Not on file   Number of children: 2   Years of education: BA   Highest education level: Not on file  Occupational History   Not on file  Tobacco Use   Smoking status: Never   Smokeless tobacco: Never  Vaping Use   Vaping Use: Never used  Substance and Sexual Activity   Alcohol use: Yes    Alcohol/week: 7.0 standard drinks    Types: 7 Glasses of wine per week    Comment: 1 glass of red wine with dinner   Drug use: No   Sexual activity: Not Currently  Other Topics Concern   Not on file  Social History Narrative   Drinks 2 cups of coffee a day    Social Determinants of Health   Financial Resource Strain: Not on file  Food Insecurity: Not on file  Transportation Needs: Not on file  Physical Activity: Not on file  Stress: Not on file  Social Connections: Not on file    His Allergies Are:  No Known Allergies:   His Current Medications Are:  Outpatient Encounter Medications as of 04/19/2021  Medication Sig   apixaban (ELIQUIS) 5 MG TABS tablet TAKE 1 TABLET (5 MG TOTAL) BY MOUTH 2 (TWO) TIMES DAILY.   apixaban (ELIQUIS) 5 MG TABS tablet TAKE 1 TABLET (5 MG TOTAL) BY MOUTH 2 (TWO) TIMES DAILY.   carbidopa-levodopa (SINEMET CR) 50-200 MG tablet Take 1 tablet by mouth at bedtime.   carbidopa-levodopa (SINEMET CR) 50-200 MG tablet TAKE  1 TABLET BY MOUTH AT BEDTIME.   carbidopa-levodopa (SINEMET CR) 50-200 MG tablet TAKE 1 TABLET BY MOUTH AT BEDTIME. TAKE AT 10PM   carbidopa-levodopa (SINEMET IR) 25-100 MG tablet Take 1.5 tablets by mouth 5 (five) times daily. Take 1 1/2 pills 5 times a day, at 8  am, 11 am, 2 PM, 5 pm and 8 PM.   carbidopa-levodopa (SINEMET IR) 25-100 MG tablet TAKE 1.5 TABLETS BY MOUTH 5 (FIVE) TIMES DAILY. TAKE 1 1/2 PILLS 5 TIMES A DAY, AT 8 AM, 11 AM, 2 PM, 5 PM AND 8 PM.   carbidopa-levodopa (SINEMET IR) 25-100 MG tablet TAKE 1 1/2 PILLS 5 TIMES A DAY, AT 8 AM, 11 AM, 2 PM, 5 PM AND 8 PM.   carbidopa-levodopa (SINEMET IR) 25-100 MG tablet TAKE 1 1/2 TABLETS 5 TIMES A DAY, AT 8 AM, 11 AM, 2 PM, 5 PM AND 8 PM.   COVID-19 mRNA Vac-TriS, Pfizer, SUSP injection USE AS DIRECTED   latanoprost (XALATAN) 0.005 % ophthalmic solution Place 1 drop into both eyes at bedtime.   latanoprost (XALATAN) 0.005 % ophthalmic solution PLACE 1 DROP INTO BOTH EYES AT BEDTIME.   latanoprost (XALATAN) 0.005 % ophthalmic solution PLACE 1 DROP INTO BOTH EYES NIGHTLY.   latanoprost (XALATAN) 0.005 % ophthalmic solution APPLY 1 DROP TO EYE NIGHTLY.   Multiple Vitamin (MULTIVITAMIN) tablet Take 1 tablet by mouth daily.   propranolol ER (INDERAL LA) 60 MG 24 hr capsule Take 1 capsule (60 mg total) by mouth daily.   propranolol ER (INDERAL LA) 60 MG 24 hr capsule TAKE 1 CAPSULE (60 MG TOTAL) BY MOUTH DAILY.   propranolol ER (INDERAL LA) 60 MG 24 hr capsule TAKE 1 CAPSULE (60 MG TOTAL) BY MOUTH DAILY.   No facility-administered encounter medications on file as of 04/19/2021.  :  Review of Systems:  Out of a complete 14 point review of systems, all are reviewed and negative with Sean exception of these symptoms as listed below:  Review of Systems  Neurological:        Here for 6 month f/u. Reports Sean Ward has been doing fairly well. Denies any falls.   Objective:  Neurological Exam  Physical Exam Physical Examination:   Vitals:   04/19/21 1308  BP: 106/70  Pulse: 62  SpO2: 97%    General Examination: Sean Ward is a very pleasant 80 y.o. male in no acute distress. Sean Ward appears well-developed and well-nourished and well groomed.   HEENT: Normocephalic, atraumatic, pupils are equal, round and  reactive to light, extraocular tracking shows mild saccadic breakdown.  Mild to moderate facial masking, slight left ptosis but stable.  Moderate nuchal rigidity, stable.  No significant lower jaw or lip tremor today.  No significant sialorrhea.  Hearing is grossly intact.  Tongue protrudes centrally and palate elevates symmetrically.   Speech is moderately hypophonic, no obvious dysarthria noted today.     Chest: Clear to auscultation without wheezing, rhonchi or crackles noted.   Heart: S1+S2+0, irregularly irreg.      Abdomen: Soft, non-tender and non-distended with normal bowel sounds appreciated on auscultation.   Extremities: There is trace to 1+ edema in Sean distal lower extremities, left more than right.   Skin: Warm and dry without trophic changes noted.   Musculoskeletal: exam reveals no obvious joint deformities.     Neurologically: Mental status: Sean Ward is awake, alert and oriented in all 4 spheres. His immediate and remote memory, attention, language skills and fund of knowledge are appropriate,  but Sean Ward does some slowness in thinking and word finding difficulties at times.  Thought process is linear. Mood is normal and affect are good. Cranial nerves II - XII are as described above under HEENT exam. Motor exam: Normal bulk, and strength for age, tone is mildly increased. Sean Ward has no significant resting tremor today. Fine motor skills are moderately impaired on Sean left, slightly better on Sean right.  Sean Ward stands up with difficulty and posture is moderately stooped, also has a forward tilt in Sean lower back.  Sean Ward walks with mild difficulty, no walking aid. Mild insecurity with turning.  Balance is  mildly impaired but stable.     Assessment and Plan:  In summary, Sean Ward is a very pleasant 80 year old male with an underlying medical history of bronchitis, a fib, osteoarthritis, and mildly overweight state, who presents for follow-up consultation of his left-sided predominant  Parkinson's disease, with progression noted over time.  Sean Ward has had decline in his mobility but also more issues with right knee arthritis and chronic back pain. Sean Ward has had steroid injections and ultimately may need a knee replacement.  Sean Ward is currently fairly stable on Sinemet 25-100 mg strength 1-1/2 pills 5 times a day and Sinemet CR 50-200 mg strength at bedtime.   We increased Sean Sinemet in May 2021, of this year and also added Sean ER at Sean time.  His brain MRI without contrast on 06/18/16 showed chronic white matter changes, no acute changes, no significant atrophy for age.  Sean Ward is encouraged to continue with his current medication regimen.  Prescription is up-to-date, renewed recently by his primary care physician.  Sean Ward is advised to follow-up routinely in this office in 6 months, sooner if needed.  I answered all his questions today and Sean Ward was in agreement.  I spent 30 minutes in total face-to-face time and in reviewing records during pre-charting, more than 50% of which was spent in counseling and coordination of care, reviewing test results, reviewing medications and treatment regimen and/or in discussing or reviewing Sean diagnosis of PD, Sean prognosis and treatment options. Pertinent laboratory and imaging test results that were available during this visit with Sean Ward were reviewed by me and considered in my medical decision making (see chart for details).

## 2021-04-19 NOTE — Patient Instructions (Signed)
It was nice to see you again today.  Your neurological exam is stable and I do think we can continue with your current medication regimen which is generic Sinemet immediate release 1-1/2 pills 5 times a day and generic Sinemet long-acting 1 pill at bedtime.  New prescription is up-to-date, most recently renewed by Dr. Delford Field.  Please keep your appointment with your primary care and have them look at your lower extremity swelling.  You may need further evaluation for your edema.  Please follow-up routinely in this clinic in about 6 months.  t

## 2021-04-24 MED FILL — Propranolol HCl Cap ER 24HR 60 MG: ORAL | 30 days supply | Qty: 30 | Fill #2 | Status: AC

## 2021-04-25 ENCOUNTER — Other Ambulatory Visit: Payer: Self-pay

## 2021-05-07 NOTE — Progress Notes (Deleted)
Subjective:    Patient ID: Sean Ward, male    DOB: 1941/06/03, 80 y.o.   MRN: [id number]  07/04/20 80 y.o.M PCP [facility] The patient comes in today for work in visit and complains of a rash on the posterior right gluteal area and right posterior thigh for the past 1/2 weeks.  It is very pruritic and irritating.  He was uncertain whether an insect bite occurred.  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  Patient has parkinsonism is being followed closely by neurology and his Sinemet doses have been adjusted.  He denies any recent falls with this.  He has been recommended to receive a cane or rolling walker but he has declined this in the past.  He would like referrals to ophthalmology and dental care.  The patient states that he also has low back pain and is being evaluated by orthopedic surgeon and MRI is upcoming at this time.  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  With the course of acyclovir the area on the buttocks has dried up.  Patient comes in today also for follow-up of his parkinsonism chronic atrial fibrillation chronic arthritis in the right knee.  He does need a knee replacement on the right knee and he is trying to put this off as long as he can.  He does plan to travel to [place] and spent at least 6 months visiting relatives in [place].  Patient is taking his Sinemet for parkinsonism and has not had much change in this regard.  The patient's atrial fibrillation is well controlled with beta-blocker and he remains on chronic anticoagulant therapy.  All medications are able to be refilled. There are no other complaints  11/22/2020  Sean Ward is a 80 year old right-handed gentleman originally from  [place], with an underlying medical history of bronchitis, PAF, and osteoarthritis. The patient presents today for his routine follow-up with complaints of bilateral lower leg swelling and effusion of the rt knee. As directed by cardiology previously, the patient is taking Lasix and has compression stockings. The patient reports that he has had one recent fall not associated with his Parkinson's disease. Reports mild tremors at rest, but otherwise, his condition is stable with meds. Sean Ward says that he has an eye exam today with Dr. Nile Riggs for new eyeglasses.   Sean Ward will be traveling to [place] for six months to see his relatives and wants to receive the covid booster vaccine.   Needs: Covid Booster  7/18 Parkinson disease (HCC) Stable Continue Sinemet Encouraged to use a cane for safety  Unsteady gait Unsteady gait due to parkinsonism Encouraged to use a cane and counseled regarding fall prevention.  Tremor Mild tremors at rest Continue taking sinemet   Venous stasis Encouraged to use mild-moderate compression stockings to improve circulation  Atrial fibrillation Chronic Atrial fibrillation rate is stable Continue taking Eliquis and propranolol Refill propranolol for a 3 month supply due to patient traveling.  Primary osteoarthritis of right knee Continue to follow up with orthopedics for chronic rt knee pain and effusion.   Dyslipidemia Continue observation off lipid therapy   Covid Booster Recommended getting covid booster at [facility] or [facility] Recommended a KN95 mask  Sean Ward was seen today  for follow-up.     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   History of bronchitis    Inguinal hernia, right 04/08/2014   Parkinson's disease (HCC)    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HCC)    Scoliosis of cervicothoracic  spine    Scoliosis of thoracolumbar spine    Spondylosis of lumbar spine      Family History  Problem Relation Age of Onset   Deafness Mother    Deafness Sister    Heart disease Neg Hx      Social History   Socioeconomic History   Marital status: Legally Separated    Spouse name: Not on file   Number of children: 2   Years of education: BA   Highest education level: Not on file  Occupational History   Not on file  Tobacco Use   Smoking status: Never   Smokeless tobacco: Never  Vaping Use   Vaping Use: Never used  Substance and Sexual Activity   Alcohol use: Yes    Alcohol/week: 7.0 standard drinks    Types: 7 Glasses of wine per week    Comment: 1 glass of red wine with dinner   Drug use: No   Sexual activity: Not Currently  Other Topics Concern   Not on file  Social History Narrative   Drinks 2 cups of coffee a day    Social Determinants of Health   Financial Resource Strain: Not on file  Food Insecurity: Not on file  Transportation Needs: Not on file  Physical Activity: Not on file  Stress: Not on file  Social Connections: Not on file  Intimate Partner Violence: Not on file     No Known Allergies   Outpatient Medications Prior to Visit  Medication Sig Dispense Refill   apixaban (ELIQUIS) 5 MG TABS tablet TAKE 1 TABLET (5 MG TOTAL) BY MOUTH 2 (TWO) TIMES DAILY. 180 tablet 1   apixaban (ELIQUIS) 5 MG TABS tablet TAKE 1 TABLET (5 MG TOTAL) BY MOUTH 2 (TWO) TIMES DAILY. 180 tablet 1   carbidopa-levodopa (SINEMET CR) 50-200 MG tablet Take 1 tablet by mouth at bedtime. 90 tablet 3   carbidopa-levodopa (SINEMET CR) 50-200 MG tablet TAKE 1 TABLET BY MOUTH AT BEDTIME. 90 tablet 3   carbidopa-levodopa (SINEMET CR) 50-200 MG tablet TAKE 1 TABLET BY MOUTH AT BEDTIME. TAKE AT 10PM 90 tablet 3   carbidopa-levodopa (SINEMET IR) 25-100 MG tablet Take 1.5 tablets by mouth 5 (five) times daily. Take 1 1/2 pills 5 times a day, at 8 am, 11 am, 2 PM, 5 pm and 8 PM. 680 tablet  3   carbidopa-levodopa (SINEMET IR) 25-100 MG tablet TAKE 1.5 TABLETS BY MOUTH 5 (FIVE) TIMES DAILY. TAKE 1 1/2 PILLS 5 TIMES A DAY, AT 8 AM, 11 AM, 2 PM, 5 PM AND 8 PM. 680 tablet 3   carbidopa-levodopa (SINEMET IR) 25-100 MG tablet TAKE 1 1/2 PILLS 5 TIMES A DAY, AT 8 AM, 11 AM, 2 PM, 5 PM AND 8 PM. 680 tablet 3   carbidopa-levodopa (SINEMET IR) 25-100 MG tablet TAKE 1 1/2 TABLETS 5 TIMES A DAY, AT 8 AM, 11 AM, 2 PM, 5 PM AND 8 PM. 680 tablet 3   COVID-19 mRNA Vac-TriS, Pfizer, SUSP injection USE AS DIRECTED .3 mL 0   latanoprost (XALATAN) 0.005 % ophthalmic solution Place  1 drop into both eyes at bedtime. 7.5 mL 1   latanoprost (XALATAN) 0.005 % ophthalmic solution PLACE 1 DROP INTO BOTH EYES AT BEDTIME. 15 mL 1   latanoprost (XALATAN) 0.005 % ophthalmic solution PLACE 1 DROP INTO BOTH EYES NIGHTLY. 2.5 mL 3   latanoprost (XALATAN) 0.005 % ophthalmic solution APPLY 1 DROP TO EYE NIGHTLY. 12.5 mL 3   Multiple Vitamin (MULTIVITAMIN) tablet Take 1 tablet by mouth daily.     propranolol ER (INDERAL LA) 60 MG 24 hr capsule Take 1 capsule (60 mg total) by mouth daily. 90 capsule 3   propranolol ER (INDERAL LA) 60 MG 24 hr capsule TAKE 1 CAPSULE (60 MG TOTAL) BY MOUTH DAILY. 90 capsule 3   propranolol ER (INDERAL LA) 60 MG 24 hr capsule TAKE 1 CAPSULE (60 MG TOTAL) BY MOUTH DAILY. 90 capsule 3   No facility-administered medications prior to visit.    Review of Systems     Objective:   Physical Exam HENT:     Head: Normocephalic.     Nose: No congestion or rhinorrhea.     Mouth/Throat:     Mouth: Mucous membranes are moist.  Eyes:     Extraocular Movements: Extraocular movements intact.     Pupils: Pupils are equal, round, and reactive to light.  Cardiovascular:     Pulses: Normal pulses.     Heart sounds: Normal heart sounds.  Pulmonary:     Effort: Pulmonary effort is normal.     Breath sounds: Normal breath sounds.  Abdominal:     General: Bowel sounds are normal.     Palpations:  Abdomen is soft.  Musculoskeletal:     Cervical back: Normal range of motion.     Comments: unstable gait associated with parkinsonism  Skin:    General: Skin is warm and dry.  Neurological:     General: No focal deficit present.     Mental Status: He is alert and oriented to person, place, and time.       Assessment & Plan:  I personally reviewed all images and lab data in the [facility] system as well as any outside material available during this office visit and agree with the  radiology impressions.  No problem-specific Assessment & Plan notes found for this encounter.  Covid Booster Recommended getting [facility] booster [facility] Recommended a KN95 mask  There are no diagnoses linked to this encounter.  f/u in 3 months

## 2021-05-08 ENCOUNTER — Other Ambulatory Visit: Payer: Self-pay

## 2021-05-08 ENCOUNTER — Ambulatory Visit: Payer: Medicare Other | Admitting: Critical Care Medicine

## 2021-05-10 ENCOUNTER — Ambulatory Visit: Payer: Medicare Other | Attending: Critical Care Medicine | Admitting: Critical Care Medicine

## 2021-05-10 ENCOUNTER — Other Ambulatory Visit: Payer: Self-pay

## 2021-05-10 ENCOUNTER — Encounter: Payer: Self-pay | Admitting: Critical Care Medicine

## 2021-05-10 VITALS — BP 121/56 | HR 82 | Resp 16 | Wt 174.0 lb

## 2021-05-10 DIAGNOSIS — I4819 Other persistent atrial fibrillation: Secondary | ICD-10-CM

## 2021-05-10 DIAGNOSIS — I878 Other specified disorders of veins: Secondary | ICD-10-CM

## 2021-05-10 DIAGNOSIS — M542 Cervicalgia: Secondary | ICD-10-CM | POA: Diagnosis not present

## 2021-05-10 DIAGNOSIS — M4126 Other idiopathic scoliosis, lumbar region: Secondary | ICD-10-CM

## 2021-05-10 DIAGNOSIS — M545 Low back pain, unspecified: Secondary | ICD-10-CM | POA: Diagnosis not present

## 2021-05-10 DIAGNOSIS — E785 Hyperlipidemia, unspecified: Secondary | ICD-10-CM

## 2021-05-10 DIAGNOSIS — M1711 Unilateral primary osteoarthritis, right knee: Secondary | ICD-10-CM

## 2021-05-10 DIAGNOSIS — R2681 Unsteadiness on feet: Secondary | ICD-10-CM

## 2021-05-10 DIAGNOSIS — G20 Parkinson's disease: Secondary | ICD-10-CM

## 2021-05-10 MED ORDER — CARBIDOPA-LEVODOPA 25-100 MG PO TABS
ORAL_TABLET | ORAL | 3 refills | Status: DC
Start: 2021-05-10 — End: 2022-05-16
  Filled 2021-05-10: qty 225, 30d supply, fill #0
  Filled 2021-06-28: qty 225, 30d supply, fill #1
  Filled 2021-07-24: qty 225, 30d supply, fill #2
  Filled 2021-08-30: qty 225, 30d supply, fill #3
  Filled 2021-09-26: qty 225, 30d supply, fill #4
  Filled 2021-10-26: qty 225, 30d supply, fill #0
  Filled 2021-10-26: qty 225, 30d supply, fill #5
  Filled 2021-10-26: qty 225, 30d supply, fill #0
  Filled 2021-11-29: qty 225, 30d supply, fill #1
  Filled 2021-12-28: qty 225, 30d supply, fill #2
  Filled 2022-01-30: qty 225, 30d supply, fill #3
  Filled 2022-03-02: qty 225, 30d supply, fill #4

## 2021-05-10 MED ORDER — PROPRANOLOL HCL ER 60 MG PO CP24
ORAL_CAPSULE | Freq: Every day | ORAL | 3 refills | Status: DC
  Filled 2021-05-10: qty 90, fill #0
  Filled 2021-05-23: qty 30, 30d supply, fill #0
  Filled 2021-06-20: qty 30, 30d supply, fill #1
  Filled 2021-07-24: qty 30, 30d supply, fill #2
  Filled 2021-08-22: qty 30, 30d supply, fill #3
  Filled 2021-09-19: qty 30, 30d supply, fill #4
  Filled 2021-10-18: qty 30, 30d supply, fill #5
  Filled 2021-11-20: qty 30, 30d supply, fill #6
  Filled 2021-11-21: qty 30, 30d supply, fill #0
  Filled 2021-12-19: qty 30, 30d supply, fill #1
  Filled 2022-01-18: qty 30, 30d supply, fill #2
  Filled 2022-02-14: qty 30, 30d supply, fill #3
  Filled 2022-03-15: qty 30, 30d supply, fill #4

## 2021-05-10 MED ORDER — CARBIDOPA-LEVODOPA ER 50-200 MG PO TBCR
1.0000 | EXTENDED_RELEASE_TABLET | Freq: Every day | ORAL | 3 refills | Status: DC
  Filled 2021-05-10: qty 30, 30d supply, fill #0
  Filled 2021-06-11: qty 30, 30d supply, fill #1
  Filled 2021-07-13: qty 30, 30d supply, fill #2
  Filled 2021-08-10: qty 30, 30d supply, fill #3
  Filled 2021-09-10: qty 30, 30d supply, fill #4
  Filled 2021-10-10: qty 30, 30d supply, fill #5
  Filled 2021-11-13: qty 30, 30d supply, fill #6
  Filled 2021-11-13: qty 30, 30d supply, fill #0
  Filled 2021-12-11: qty 30, 30d supply, fill #1
  Filled 2022-01-09: qty 30, 30d supply, fill #2
  Filled 2022-02-09 (×2): qty 30, 30d supply, fill #3
  Filled 2022-03-15 – 2022-03-16 (×2): qty 30, 30d supply, fill #4

## 2021-05-10 MED ORDER — LATANOPROST 0.005 % OP SOLN
OPHTHALMIC | 3 refills | Status: AC
  Filled 2021-05-10: qty 5, 37d supply, fill #0
  Filled 2021-05-26 – 2021-06-23 (×3): qty 2.5, 30d supply, fill #0
  Filled 2021-07-26: qty 2.5, 30d supply, fill #1
  Filled 2021-08-24: qty 2.5, 30d supply, fill #2
  Filled 2021-10-10: qty 2.5, 30d supply, fill #3

## 2021-05-10 MED ORDER — APIXABAN 5 MG PO TABS
ORAL_TABLET | Freq: Two times a day (BID) | ORAL | 1 refills | Status: DC
  Filled 2021-05-10: qty 60, 30d supply, fill #0
  Filled 2021-06-11: qty 60, 30d supply, fill #1
  Filled 2021-07-10: qty 60, 30d supply, fill #2
  Filled 2021-08-10: qty 180, 90d supply, fill #3

## 2021-05-10 NOTE — Assessment & Plan Note (Signed)
Stable osteoarthritis of the right knee

## 2021-05-10 NOTE — Assessment & Plan Note (Signed)
Chronic atrial fibrillation rate controlled no changes in medications continue propranolol and apixaban

## 2021-05-10 NOTE — Progress Notes (Signed)
Subjective:    Patient ID: Sean Ward, male    DOB: June 25, 1941, 80 y.o.   MRN: [id number]  07/04/20 80 y.o.M PCP [facility] The patient comes in today for work in visit and complains of a rash on the posterior right gluteal area and right posterior thigh for the past 1/2 weeks.  It is very pruritic and irritating.  He was uncertain whether an insect bite occurred.  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  Patient has parkinsonism is being followed closely by neurology and his Sinemet doses have been adjusted.  He denies any recent falls with this.  He has been recommended to receive a cane or rolling walker but he has declined this in the past.  He would like referrals to ophthalmology and dental care.  The patient states that he also has low back pain and is being evaluated by orthopedic surgeon and MRI is upcoming at this time.  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  With the course of acyclovir the area on the buttocks has dried up.  Patient comes in today also for follow-up of his parkinsonism chronic atrial fibrillation chronic arthritis in the right knee.  He does need a knee replacement on the right knee and he is trying to put this off as long as he can.  He does plan to travel to [place] and spent at least 6 months visiting relatives in [place].  Patient is taking his Sinemet for parkinsonism and has not had much change in this regard.  The patient's atrial fibrillation is well controlled with beta-blocker and he remains on chronic anticoagulant therapy.  All medications are able to be refilled. There are no other complaints  11/22/2020  Mr. Sean Ward is a 80 year old right-handed gentleman originally from  [place], with an underlying medical history of bronchitis, PAF, and osteoarthritis. The patient presents today for his routine follow-up with complaints of bilateral lower leg swelling and effusion of the rt knee. As directed by cardiology previously, the patient is taking Lasix and has compression stockings. The patient reports that he has had one recent fall not associated with his Parkinson's disease. Reports mild tremors at rest, but otherwise, his condition is stable with meds. Mr. Sean Ward says that he has an eye exam today with Dr. Nile Riggs for new eyeglasses.   Mr. Sean Ward will be traveling to [place] for six months to see his relatives and wants to receive the covid booster vaccine.   Needs: Covid Booster  05/10/2021 This is a pleasant 80 year old male seen today in return follow-up for his parkinsonism and chronic atrial fibrillation chronic anticoagulation.  Since the last visit in February the patient is doing well.  He has had no falls.  His rate is controlled with his atrial fibrillation with propranolol.  He has had no problems with breathing.  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  Patient also has chronic neck pain as well.  He saw orthopedics in 2021 and they recommended MRI of the neck and lower spine but he did not follow through on this.  The patient is in need of a fourth COVID booster he would like  information provided on this.  He also needs a shingles vaccine to be started.   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   History of bronchitis    Inguinal hernia, right 04/08/2014   Parkinson's disease (HCC)    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HCC)    Scoliosis of cervicothoracic spine    Scoliosis of thoracolumbar spine    Spondylosis of lumbar spine      Family  History  Problem Relation Age of Onset   Deafness Mother    Deafness Sister    Heart disease Neg Hx      Social History   Socioeconomic History   Marital status: Legally Separated    Spouse name: Not on file   Number of children: 2   Years of education: BA   Highest education level: Not on file  Occupational History   Not on file  Tobacco Use   Smoking status: Never   Smokeless tobacco: Never  Vaping Use   Vaping Use: Never used  Substance and Sexual Activity   Alcohol use: Yes    Alcohol/week: 7.0 standard drinks    Types: 7 Glasses of wine per week    Comment: 1 glass of red wine with dinner   Drug use: No   Sexual activity: Not Currently  Other Topics Concern   Not on file  Social History Narrative   Drinks 2 cups of coffee a day    Social Determinants of Health   Financial Resource Strain: Not on file  Food Insecurity: Not on file  Transportation Needs: Not on file  Physical Activity: Not on file  Stress: Not on file  Social Connections: Not on file  Intimate Partner Violence: Not on file     No Known Allergies   Outpatient Medications Prior to Visit  Medication Sig Dispense Refill   Multiple Vitamin (MULTIVITAMIN) tablet Take 1 tablet by mouth Sean.     apixaban (ELIQUIS) 5 MG TABS tablet TAKE 1 TABLET (5 MG TOTAL) BY MOUTH 2 (TWO) TIMES Sean. 180 tablet 1   carbidopa-levodopa (SINEMET CR) 50-200 MG tablet TAKE 1 TABLET BY MOUTH AT BEDTIME. 90 tablet 3   carbidopa-levodopa (SINEMET IR) 25-100 MG tablet TAKE 1 1/2 PILLS 5 TIMES A DAY, AT 8 AM, 11 AM, 2 PM, 5 PM AND 8 PM. 680 tablet 3   COVID-19 mRNA Vac-TriS, Pfizer, SUSP injection USE AS DIRECTED .3 mL 0   latanoprost (XALATAN) 0.005 % ophthalmic solution APPLY 1 DROP TO EYE NIGHTLY. 12.5 mL 3   propranolol ER (INDERAL LA) 60 MG 24 hr capsule TAKE 1 CAPSULE (60 MG TOTAL) BY MOUTH Sean. 90 capsule 3   apixaban (ELIQUIS) 5 MG TABS tablet TAKE 1 TABLET (5 MG TOTAL) BY MOUTH 2 (TWO) TIMES Sean. 180 tablet  1   carbidopa-levodopa (SINEMET CR) 50-200 MG tablet Take 1 tablet by mouth at bedtime. 90 tablet 3   carbidopa-levodopa (SINEMET CR) 50-200 MG tablet TAKE 1 TABLET BY MOUTH AT BEDTIME. TAKE AT 10PM 90 tablet 3   carbidopa-levodopa (SINEMET IR) 25-100 MG tablet Take 1.5 tablets by mouth 5 (five) times Sean. Take 1 1/2 pills 5 times a day, at 8 am, 11 am, 2 PM, 5 pm and 8 PM. 680 tablet 3   carbidopa-levodopa (SINEMET IR) 25-100 MG tablet TAKE 1.5 TABLETS BY MOUTH 5 (FIVE) TIMES  Sean. TAKE 1 1/2 PILLS 5 TIMES A DAY, AT 8 AM, 11 AM, 2 PM, 5 PM AND 8 PM. 680 tablet 3   carbidopa-levodopa (SINEMET IR) 25-100 MG tablet TAKE 1 1/2 TABLETS 5 TIMES A DAY, AT 8 AM, 11 AM, 2 PM, 5 PM AND 8 PM. 680 tablet 3   latanoprost (XALATAN) 0.005 % ophthalmic solution Place 1 drop into both eyes at bedtime. 7.5 mL 1   latanoprost (XALATAN) 0.005 % ophthalmic solution PLACE 1 DROP INTO BOTH EYES AT BEDTIME. 15 mL 1   latanoprost (XALATAN) 0.005 % ophthalmic solution PLACE 1 DROP INTO BOTH EYES NIGHTLY. 2.5 mL 3   propranolol ER (INDERAL LA) 60 MG 24 hr capsule Take 1 capsule (60 mg total) by mouth Sean. 90 capsule 3   propranolol ER (INDERAL LA) 60 MG 24 hr capsule TAKE 1 CAPSULE (60 MG TOTAL) BY MOUTH Sean. 90 capsule 3   No facility-administered medications prior to visit.    Review of Systems  Constitutional: Negative.   HENT: Negative.    Eyes: Negative.   Respiratory: Negative.    Cardiovascular: Negative.   Gastrointestinal: Negative.   Genitourinary: Negative.   Musculoskeletal:  Positive for back pain and gait problem.  Neurological:  Negative for tremors, syncope, facial asymmetry, speech difficulty, weakness, light-headedness and headaches.  Hematological: Negative.   Psychiatric/Behavioral: Negative.        Objective:   Physical Exam HENT:     Head: Normocephalic.     Nose: No congestion or rhinorrhea.     Mouth/Throat:     Mouth: Mucous membranes are moist.  Eyes:     Extraocular  Movements: Extraocular movements intact.     Pupils: Pupils are equal, round, and reactive to light.  Cardiovascular:     Pulses: Normal pulses.     Heart sounds: Normal heart sounds.  Pulmonary:     Effort: Pulmonary effort is normal.     Breath sounds: Normal breath sounds.  Abdominal:     General: Bowel sounds are normal.     Palpations: Abdomen is soft.  Musculoskeletal:     Cervical back: Normal range of motion.     Comments: unstable gait associated with parkinsonism  Skin:    General: Skin is warm and dry.  Neurological:     General: No focal deficit present.     Mental Status: He is alert and oriented to person, place, and time.   BP (!) 121/56   Pulse 82   Resp 16   Wt 174 lb (78.9 kg)   SpO2 95%   BMI 25.70 kg/m      Assessment & Plan:  I personally reviewed all images and lab data in the [facility] system as well as any outside material available during this office visit and agree with the  radiology impressions.  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  Diagnoses and all orders for this visit:  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HCC)  Other idiopathic  scoliosis, lumbar region -     DG Lumbar Spine Complete; Future  Lumbar back pain -     DG Lumbar Spine Complete; Future  Neck pain -     DG Cervical Spine Complete; Future  Parkinson disease (HCC)  Primary osteoarthritis of right knee  Unsteady gait  Dyslipidemia  Venous stasis  Other orders -     apixaban (ELIQUIS) 5 MG TABS tablet; TAKE 1 TABLET (5 MG TOTAL) BY MOUTH 2 (TWO) TIMES Sean. -     carbidopa-levodopa (SINEMET IR) 25-100 MG tablet; TAKE 1 & 1/2 TABLETS BY  MOUTH 5 TIMES A DAY, AT 8 AM, 11 AM, 2 PM, 5 PM AND 8 PM. -     latanoprost (XALATAN) 0.005 % ophthalmic solution; APPLY 1 DROP TO EYE NIGHTLY. -     propranolol ER (INDERAL LA) 60 MG 24 hr capsule; TAKE 1 CAPSULE (60 MG TOTAL) BY MOUTH Sean. -     carbidopa-levodopa (SINEMET CR) 50-200 MG tablet; TAKE 1 TABLET BY MOUTH AT BEDTIME.  38 minutes spent with history and physical educating patient complex decision making

## 2021-05-10 NOTE — Assessment & Plan Note (Signed)
Continue with as needed compression stockings

## 2021-05-10 NOTE — Assessment & Plan Note (Signed)
Chronic lumbar back pain will reassess with x-rays may yet refer back to orthopedic spine

## 2021-05-10 NOTE — Assessment & Plan Note (Signed)
Gait is improving with exercise program

## 2021-05-10 NOTE — Assessment & Plan Note (Signed)
History of significant Parkinson's disease stable at this time continue Sinemet as prescribed no falls have occurred

## 2021-05-10 NOTE — Assessment & Plan Note (Signed)
Plan reimaging of spine with x-ray

## 2021-05-10 NOTE — Patient Instructions (Addendum)
No change in medications  Obtain xrays of neck and lumbar spine at [facility] center, no appointment needed  Please get our 4th covid vaccine below is where you can find a vaccine:  COVID-19 Vaccine Information can be found at: >>Call the number [email or web address] For questions related to vaccine distribution or appointments, please email [email or web address] or call [phone].    Refills made on all medications  Return Dr Delford Field 4 months

## 2021-05-10 NOTE — Assessment & Plan Note (Signed)
Continue diet control

## 2021-05-10 NOTE — Assessment & Plan Note (Signed)
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

## 2021-05-11 ENCOUNTER — Ambulatory Visit: Payer: Medicare Other

## 2021-05-11 ENCOUNTER — Other Ambulatory Visit: Payer: Self-pay

## 2021-05-17 ENCOUNTER — Other Ambulatory Visit: Payer: Self-pay

## 2021-05-23 ENCOUNTER — Other Ambulatory Visit: Payer: Self-pay

## 2021-05-26 ENCOUNTER — Ambulatory Visit
Admission: RE | Admit: 2021-05-26 | Discharge: 2021-05-26 | Disposition: A | Discharge status: Home / Self Care | Payer: Medicare Other | Source: Ambulatory Visit | Attending: Critical Care Medicine | Admitting: Critical Care Medicine

## 2021-05-26 ENCOUNTER — Other Ambulatory Visit: Payer: Self-pay

## 2021-05-26 DIAGNOSIS — M4126 Other idiopathic scoliosis, lumbar region: Secondary | ICD-10-CM

## 2021-05-26 DIAGNOSIS — M545 Low back pain, unspecified: Secondary | ICD-10-CM

## 2021-05-26 DIAGNOSIS — M542 Cervicalgia: Secondary | ICD-10-CM

## 2021-06-02 ENCOUNTER — Other Ambulatory Visit: Payer: Self-pay

## 2021-06-12 ENCOUNTER — Other Ambulatory Visit: Payer: Self-pay

## 2021-06-13 ENCOUNTER — Other Ambulatory Visit: Payer: Self-pay

## 2021-06-16 ENCOUNTER — Telehealth: Payer: Self-pay

## 2021-06-16 DIAGNOSIS — M545 Low back pain, unspecified: Secondary | ICD-10-CM

## 2021-06-16 DIAGNOSIS — M542 Cervicalgia: Secondary | ICD-10-CM

## 2021-06-16 DIAGNOSIS — M4126 Other idiopathic scoliosis, lumbar region: Secondary | ICD-10-CM

## 2021-06-16 NOTE — Telephone Encounter (Signed)
Pt was called and a VM was left informing patient of lab results. 

## 2021-06-16 NOTE — Telephone Encounter (Signed)
-----   Message from [name], MD sent at 05/29/2021  7:25 AM EDT ----- Let pt know he has advanced arthritis of lower back and neck.  I can send him to orthopedic spine if he desires,  or neurosurgery

## 2021-06-20 ENCOUNTER — Other Ambulatory Visit: Payer: Self-pay

## 2021-06-21 ENCOUNTER — Other Ambulatory Visit: Payer: Self-pay

## 2021-06-23 ENCOUNTER — Other Ambulatory Visit: Payer: Self-pay

## 2021-06-23 NOTE — Telephone Encounter (Signed)
Patient came by the office and he would like a referral to the Orthopedic Spine

## 2021-06-26 NOTE — Telephone Encounter (Signed)
Referral to orthopedic spine made dr mark yates

## 2021-06-26 NOTE — Addendum Note (Signed)
Addended by: Shan Levans E on: 06/26/2021 12:48 PM   Modules accepted: Orders

## 2021-06-28 ENCOUNTER — Other Ambulatory Visit: Payer: Self-pay

## 2021-07-01 ENCOUNTER — Telehealth: Payer: Self-pay | Admitting: Critical Care Medicine

## 2021-07-01 NOTE — Telephone Encounter (Signed)
LVM for pt to rtn my call to schedule AWV with [facility]. Please schedule AWV if pt calls the office  

## 2021-07-04 NOTE — Telephone Encounter (Signed)
Patient returning call regarding scheduling a Medicare Wellness appointment, please call back at [phone]

## 2021-07-10 ENCOUNTER — Other Ambulatory Visit: Payer: Self-pay

## 2021-07-11 ENCOUNTER — Ambulatory Visit: Payer: Medicare Other | Attending: Critical Care Medicine

## 2021-07-11 ENCOUNTER — Other Ambulatory Visit: Payer: Self-pay

## 2021-07-11 ENCOUNTER — Ambulatory Visit: Payer: Medicare Other

## 2021-07-11 VITALS — BP 112/70 | HR 78 | Resp 16 | Ht 65.0 in | Wt 178.6 lb

## 2021-07-11 DIAGNOSIS — Z0000 Encounter for general adult medical examination without abnormal findings: Secondary | ICD-10-CM | POA: Diagnosis not present

## 2021-07-11 DIAGNOSIS — Z23 Encounter for immunization: Secondary | ICD-10-CM

## 2021-07-11 NOTE — Progress Notes (Signed)
Subjective:   Sean Ward is a 80 y.o. male who presents for an Initial Medicare Annual Wellness Visit.        Objective:    Today's Vitals   07/11/21 1236  BP: 112/70  Pulse: 78  Resp: 16  SpO2: 96%  Weight: 178 lb 9.6 oz (81 kg)  Height: 5\' 5"  (1.651 m)  PainSc: 8    Body mass index is 29.72 kg/m.  Advanced Directives 07/11/2021 07/04/2020 08/21/2017 12/12/2016 08/16/2016 05/24/2016 05/10/2016  Does Patient Have a Medical Advance Directive? No No No No No No No  Does patient want to make changes to medical advance directive? - - - - - - Yes - information given  Would patient like information on creating a medical advance directive? Yes (Inpatient - patient defers creating a medical advance directive at this time - Information given) - - - No - patient declined information No - patient declined information -  Pre-existing out of facility DNR order (yellow form or pink MOST form) - - - - - - -    Current Medications (verified) Outpatient Encounter Medications as of 07/11/2021  Medication Sig   apixaban (ELIQUIS) 5 MG TABS tablet TAKE 1 TABLET (5 MG TOTAL) BY MOUTH 2 (TWO) TIMES DAILY.   carbidopa-levodopa (SINEMET CR) 50-200 MG tablet TAKE 1 TABLET BY MOUTH AT BEDTIME.   carbidopa-levodopa (SINEMET IR) 25-100 MG tablet TAKE 1 & 1/2 TABLETS BY  MOUTH 5 TIMES A DAY, AT 8 AM, 11 AM, 2 PM, 5 PM AND 8 PM.   latanoprost (XALATAN) 0.005 % ophthalmic solution APPLY 1 DROP TO EYE NIGHTLY.   Multiple Vitamin (MULTIVITAMIN) tablet Take 1 tablet by mouth daily.   propranolol ER (INDERAL LA) 60 MG 24 hr capsule TAKE 1 CAPSULE (60 MG TOTAL) BY MOUTH DAILY.   No facility-administered encounter medications on file as of 07/11/2021.    Allergies (verified) Patient has no known allergies.   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    History of bronchitis    Inguinal hernia, right 04/08/2014   Parkinson's disease (HCC)    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HCC)    Scoliosis of cervicothoracic spine    Scoliosis of thoracolumbar spine    Spondylosis of lumbar spine    Past Surgical History:  Procedure Laterality Date   HERNIA REPAIR     TONSILLECTOMY     Family History  Problem Relation Age of Onset   Deafness Mother    Deafness Sister    Heart disease Neg Hx    Social History   Socioeconomic History   Marital status: Legally Separated    Spouse name: Not on file   Number of children: 2   Years of education: BA   Highest education level: Not on file  Occupational History   Not on file  Tobacco Use   Smoking status: Never   Smokeless tobacco: Never  Vaping Use   Vaping Use: Never used  Substance and Sexual Activity   Alcohol use: Yes    Alcohol/week: 7.0 standard drinks    Types: 7 Glasses of wine per week    Comment: 1 glass of red wine with dinner   Drug use: No   Sexual activity: Not Currently  Other Topics Concern   Not on file  Social History Narrative   Drinks 2  cups of coffee a day    Social Determinants of Health   Financial Resource Strain: Not on file  Food Insecurity: Not on file  Transportation Needs: Not on file  Physical Activity: Not on file  Stress: Not on file  Social Connections: Not on file    Tobacco Counseling Counseling given: Not Answered   Clinical Intake:     Pain : 0-10 Pain Score: 8  Pain Location: Back Pain Orientation: Lower     Diabetes: No     Diabetic?No         Activities of Daily Living In your present state of health, do you have any difficulty performing the following activities: 07/11/2021 08/23/2020  Hearing? N N  Vision? N N  Difficulty concentrating or making decisions? N N  Walking or climbing stairs? Y N  Dressing or bathing? N N  Doing errands, shopping? N N  Preparing Food and eating ? N -  Using the Toilet? N -  In the  past six months, have you accidently leaked urine? N -  Do you have problems with loss of bowel control? N -  Managing your Medications? N -  Managing your Finances? N -  Housekeeping or managing your Housekeeping? N -  Some recent data might be hidden    Patient Care Team: [name], MD as PCP - General (Pulmonary Disease) Corky Crafts, MD as PCP - Cardiology (Cardiology)  Indicate any recent Medical Services you may have received from other than [facility] providers in the past year (date may be approximate).     Assessment:   This is a routine wellness examination for [name].  Hearing/Vision screen Vision Screening   Right eye Left eye Both eyes  Without correction     With correction 20 30 20  40 20 25    Dietary issues and exercise activities discussed:     Goals Addressed   None   Depression Screen PHQ 2/9 Scores 07/11/2021 11/22/2020 09/28/2020 08/23/2020 07/04/2020 02/24/2020 02/15/2020  PHQ - 2 Score 0 0 0 0 0 0 0  PHQ- 9 Score - 0 - - - 5 0    Fall Risk Fall Risk  07/11/2021 11/22/2020 09/28/2020 08/23/2020 07/04/2020  Falls in the past year? 0 1 0 0 0  Number falls in past yr: 0 0 0 0 0  Injury with Fall? 0 0 0 0 0  Risk for fall due to : No Fall Risks Impaired mobility;Impaired balance/gait - - -  Follow up - - Falls evaluation completed - -    FALL RISK PREVENTION PERTAINING TO THE HOME:  Any stairs in or around the home? Yes  If so, are there any without handrails? No  Home free of loose throw rugs in walkways, pet beds, electrical cords, etc? Yes  Adequate lighting in your home to reduce risk of falls? Yes   ASSISTIVE DEVICES UTILIZED TO PREVENT FALLS:  Life alert? No  Use of a cane, walker or w/c? No  Grab bars in the bathroom? No  Shower chair or bench in shower? No  Elevated toilet seat or a handicapped toilet? No   TIMED UP AND GO:  Was the test performed? Yes .  Length of time to ambulate 10 feet: <5 sec.   Gait slow and steady without  use of assistive device  Cognitive Function: MMSE - Mini Mental State Exam 07/11/2021  Orientation to time 5  Orientation to Place 5  Registration 3  Attention/ Calculation 5  Recall 2  Language- name 2 objects 2  Language- repeat 1  Language- follow 3 step command 3  Language- read & follow direction 1  Write a sentence 0  Copy design 0  Total score 27        Immunizations Immunization History  Administered Date(s) Administered   Influenza Split 08/21/2012   Influenza,inj,Quad PF,6+ Mos 07/05/2014, 08/02/2016, 12/22/2019, 07/04/2020, 07/11/2021   PFIZER Comirnaty(Gray Top)Covid-19 Tri-Sucrose Vaccine 12/07/2020   PFIZER(Purple Top)SARS-COV-2 Vaccination 04/18/2020, 05/09/2020   Tdap 08/16/2016    TDAP status: Up to date  Flu Vaccine status: Completed at today's visit  Pneumococcal vaccine status: Due, Education has been provided regarding the importance of this vaccine. Advised may receive this vaccine at local pharmacy or Health Dept. Aware to provide a copy of the vaccination record if obtained from local pharmacy or Health Dept. Verbalized acceptance and understanding.  Covid-19 vaccine status: Completed vaccines  Qualifies for Shingles Vaccine? Yes   Zostavax completed No   Shingrix Completed?: No.    Education has been provided regarding the importance of this vaccine. Patient has been advised to call insurance company to determine out of pocket expense if they have not yet received this vaccine. Advised may also receive vaccine at local pharmacy or Health Dept. Verbalized acceptance and understanding.  Screening Tests Health Maintenance  Topic Date Due   Zoster Vaccines- Shingrix (1 of 2) Never done   COVID-19 Vaccine (4 - Booster for Pfizer series) 03/01/2021   TETANUS/TDAP  08/16/2026   INFLUENZA VACCINE  Completed   HPV VACCINES  Aged Out   Hepatitis C Screening  Discontinued    Health Maintenance  Health Maintenance Due  Topic Date Due   Zoster  Vaccines- Shingrix (1 of 2) Never done   COVID-19 Vaccine (4 - Booster for Pfizer series) 03/01/2021   CRC screening: pt has not had a colonoscopy. Pt is >75 YO.  Lung Cancer Screening: (Low Dose CT Chest recommended if Age 22-80 years, 30 pack-year currently smoking OR have quit w/in 15years.) does not qualify.   Lung Cancer Screening Referral: None  Additional Screening:  Hepatitis C Screening: does qualify - not currently recommended per PCP.   Vision Screening: Recommended annual ophthalmology exams for early detection of glaucoma and other disorders of the eye. Is the patient up to date with their annual eye exam?  Yes  Who is the provider or what is the name of the office in which the patient attends annual eye exams? Dr. Christia Reading.  Dental Screening: Recommended annual dental exams for proper oral hygiene  Community Resource Referral / Chronic Care Management: CRR required this visit?  No   CCM required this visit?  No      Plan:     I have personally reviewed and noted the following in the patient's chart:   Medical and social history Use of alcohol, tobacco or illicit drugs  Current medications and supplements including opioid prescriptions. Patient is not currently taking opioid prescriptions. Functional ability and status Nutritional status Physical activity Advanced directives List of other physicians Hospitalizations, surgeries, and ER visits in previous 12 months Vitals Screenings to include cognitive, depression, and falls Referrals and appointments  In addition, I have reviewed and discussed with patient certain preventive protocols, quality metrics, and best practice recommendations. A written personalized care plan for preventive services as well as general preventive health recommendations were provided to patient.     Particia Lather, RMA   07/11/2021

## 2021-07-13 ENCOUNTER — Ambulatory Visit: Payer: Self-pay | Admitting: *Deleted

## 2021-07-13 ENCOUNTER — Other Ambulatory Visit: Payer: Self-pay

## 2021-07-13 NOTE — Telephone Encounter (Signed)
Patient is calling to report he thought he was to see provider at appointment this week- but just saw the nurse- he was upset because he is having knee pain. Patient states he has bilateral knee pain behind his knees. Patient states the pain can radiate from his back to his knees. Patient states he was previously advised he needs knee replacement- but he did not want to do that. Patient hates to take medication and is only taking one tylenol for his pain. Patient advised no appointment in the office before his ortho appointment- advise I would send note to provider to see if he can possibly be seen- or if there is anything provider can advise for his pain.

## 2021-07-13 NOTE — Telephone Encounter (Signed)
Reason for Disposition  Knee pain is a chronic symptom (recurrent or ongoing AND present > 4 weeks)  Answer Assessment - Initial Assessment Questions 1. LOCATION and RADIATION: "Where is the pain located?"      Back of knee- bilateral 2. QUALITY: "What does the pain feel like?"  (e.g., sharp, dull, aching, burning)     Burning pain 3. SEVERITY: "How bad is the pain?" "What does it keep you from doing?"   (Scale 1-10; or mild, moderate, severe)   -  MILD (1-3): doesn't interfere with normal activities    -  MODERATE (4-7): interferes with normal activities (e.g., work or school) or awakens from sleep, limping    -  SEVERE (8-10): excruciating pain, unable to do any normal activities, unable to walk     Hurts most going from sitting to standing- back pain to knee 4. ONSET: "When did the pain start?" "Does it come and go, or is it there all the time?"     On/off 1 month 5. RECURRENT: "Have you had this pain before?" If Yes, ask: "When, and what happened then?"     no 6. SETTING: "Has there been any recent work, exercise or other activity that involved that part of the body?"      Patient has been told to stop exercise bike, walking is painful 7. AGGRAVATING FACTORS: "What makes the knee pain worse?" (e.g., walking, climbing stairs, running)     Walking is painful- unable to walk very far 8. ASSOCIATED SYMPTOMS: "Is there any swelling or redness of the knee?"     Swelling on knee and ankles- compression stocking help some- advised legs up- some decrease in am 9. OTHER SYMPTOMS: "Do you have any other symptoms?" (e.g., chest pain, difficulty breathing, fever, calf pain)     no 10. PREGNANCY: "Is there any chance you are pregnant?" "When was your last menstrual period?"       N/a  Protocols used: Knee Pain-A-AH

## 2021-07-14 ENCOUNTER — Other Ambulatory Visit: Payer: Self-pay

## 2021-07-14 NOTE — Telephone Encounter (Addendum)
Patient notified about Mobile Unit options and virtual appt. availability. He request to be seen by a MD.   Was disappointed at the AWV because he thought he was going to see a doctor and address knee pain.   The soonest appt was scheduled 08/29/2021 at 10

## 2021-07-14 NOTE — Telephone Encounter (Signed)
Add him on for monday

## 2021-07-17 ENCOUNTER — Encounter: Payer: Self-pay | Admitting: Critical Care Medicine

## 2021-07-17 ENCOUNTER — Ambulatory Visit: Payer: Medicare Other | Attending: Critical Care Medicine | Admitting: Critical Care Medicine

## 2021-07-17 ENCOUNTER — Other Ambulatory Visit: Payer: Self-pay

## 2021-07-17 VITALS — BP 111/71 | HR 74 | Resp 16 | Wt 178.0 lb

## 2021-07-17 DIAGNOSIS — M545 Low back pain, unspecified: Secondary | ICD-10-CM | POA: Diagnosis not present

## 2021-07-17 NOTE — Patient Instructions (Signed)
Keep appointment with Dr Ophelia Charter of orthopedics  No change in medication  Continue acetaminophen extra strength as needed for pain  Return Dr Delford Field 6 weeks

## 2021-07-17 NOTE — Assessment & Plan Note (Signed)
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

## 2021-07-17 NOTE — Progress Notes (Signed)
Established Patient Office Visit  Subjective:  Patient ID: Sean Ward, male    DOB: 10-26-1940  Age: 80 y.o. 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  Patient states the pain is starts in the lumbar area and radiates down to the back of the leg to the knees.  He did see orthopedics previously for his right knee which was told he needed a total right knee replacement.  He denies any pain in the front parts of his knees.  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   History of bronchitis    Inguinal hernia, right 04/08/2014   Parkinson's disease (HCC)    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HCC)    Scoliosis of cervicothoracic spine    Scoliosis of thoracolumbar spine    Spondylosis of lumbar spine     Past Surgical History:  Procedure Laterality Date   HERNIA REPAIR     TONSILLECTOMY      Family History  Problem Relation Age of Onset   Deafness Mother    Deafness Sister    Heart disease Neg Hx     Social History   Socioeconomic History   Marital status: Legally Separated    Spouse name: Not on file   Number of children: 2   Years of education: BA   Highest education level: Not on file  Occupational History   Not on file  Tobacco Use   Smoking status: Never   Smokeless tobacco: Never  Vaping Use   Vaping Use: Never used  Substance and Sexual Activity   Alcohol use: Yes    Alcohol/week: 7.0 standard drinks    Types: 7 Glasses of wine per week    Comment: 1 glass of red wine with dinner    Drug use: No   Sexual activity: Not Currently  Other Topics Concern   Not on file  Social History Narrative   Drinks 2 cups of coffee a day    Social Determinants of Health   Financial Resource Strain: Not on file  Food Insecurity: Not on file  Transportation Needs: Not on file  Physical Activity: Not on file  Stress: Not on file  Social Connections: Not on file  Intimate Partner Violence: Not on file    Outpatient Medications Prior to Visit  Medication Sig Dispense Refill   apixaban (ELIQUIS) 5 MG TABS tablet TAKE 1 TABLET (5 MG TOTAL) BY MOUTH 2 (TWO) TIMES DAILY. 180 tablet 1   carbidopa-levodopa (SINEMET CR) 50-200 MG tablet TAKE 1 TABLET BY MOUTH AT BEDTIME. 90 tablet 3   carbidopa-levodopa (SINEMET IR) 25-100 MG tablet TAKE 1 & 1/2 TABLETS BY  MOUTH 5 TIMES A DAY, AT 8 AM, 11 AM, 2 PM, 5 PM AND 8 PM. 680 tablet 3   latanoprost (XALATAN) 0.005 % ophthalmic solution APPLY 1 DROP TO EYE NIGHTLY. 12.5 mL 3   Multiple Vitamin (MULTIVITAMIN) tablet Take 1 tablet by mouth daily.     propranolol ER (  INDERAL LA) 60 MG 24 hr capsule TAKE 1 CAPSULE (60 MG TOTAL) BY MOUTH DAILY. 90 capsule 3   No facility-administered medications prior to visit.    No Known Allergies  ROS Review of Systems  Constitutional: Negative.   HENT: Negative.  Negative for ear pain, postnasal drip, rhinorrhea, sinus pressure, sore throat, trouble swallowing and voice change.   Eyes: Negative.   Respiratory: Negative.  Negative for apnea, cough, choking, chest tightness, shortness of breath, wheezing and stridor.   Cardiovascular: Negative.  Negative for chest pain, palpitations and leg swelling.  Gastrointestinal: Negative.  Negative for abdominal distention, abdominal pain, nausea and vomiting.  Genitourinary: Negative.   Musculoskeletal:  Positive for back pain and gait problem. Negative for arthralgias and myalgias.       Posterior knee pain  Skin: Negative.  Negative for rash.  Allergic/Immunologic:  Negative.  Negative for environmental allergies and food allergies.  Neurological:  Negative for dizziness, syncope, weakness and headaches.  Hematological: Negative.  Negative for adenopathy. Does not bruise/bleed easily.  Psychiatric/Behavioral: Negative.  Negative for agitation and sleep disturbance. The patient is not nervous/anxious.      Objective:    Physical Exam Vitals reviewed.  Constitutional:      Appearance: Normal appearance. He is well-developed. He is not diaphoretic.  HENT:     Head: Normocephalic and atraumatic.     Nose: No nasal deformity, septal deviation, mucosal edema or rhinorrhea.     Right Sinus: No maxillary sinus tenderness or frontal sinus tenderness.     Left Sinus: No maxillary sinus tenderness or frontal sinus tenderness.     Mouth/Throat:     Pharynx: No oropharyngeal exudate.  Eyes:     General: No scleral icterus.    Conjunctiva/sclera: Conjunctivae normal.     Pupils: Pupils are equal, round, and reactive to light.  Neck:     Thyroid: No thyromegaly.     Vascular: No carotid bruit or JVD.     Trachea: Trachea normal. No tracheal tenderness or tracheal deviation.  Cardiovascular:     Rate and Rhythm: Normal rate and regular rhythm.     Chest Wall: PMI is not displaced.     Pulses: Normal pulses. No decreased pulses.     Heart sounds: Normal heart sounds, S1 normal and S2 normal. Heart sounds not distant. No murmur heard. No systolic murmur is present.  No diastolic murmur is present.    No friction rub. No gallop. No S3 or S4 sounds.  Pulmonary:     Effort: No tachypnea, accessory muscle usage or respiratory distress.     Breath sounds: No stridor. No decreased breath sounds, wheezing, rhonchi or rales.  Chest:     Chest wall: No tenderness.  Abdominal:     General: Bowel sounds are normal. There is no distension.     Palpations: Abdomen is soft. Abdomen is not rigid.     Tenderness: There is no abdominal tenderness. There is no guarding  or rebound.  Musculoskeletal:        General: Tenderness and deformity present. Normal range of motion.     Cervical back: Normal range of motion and neck supple. No edema, erythema or rigidity. No muscular tenderness. Normal range of motion.     Comments: Decrease ROM R knee Tender left lower back area scoliosis  Lymphadenopathy:     Head:     Right side of head: No submental or submandibular adenopathy.     Left side of head: No  submental or submandibular adenopathy.     Cervical: No cervical adenopathy.  Skin:    General: Skin is warm and dry.     Coloration: Skin is not pale.     Findings: No rash.     Nails: There is no clubbing.  Neurological:     General: No focal deficit present.     Mental Status: He is alert and oriented to person, place, and time. Mental status is at baseline.     Sensory: No sensory deficit.  Psychiatric:        Mood and Affect: Mood normal.        Speech: Speech normal.        Behavior: Behavior normal.    BP 111/71   Pulse 74   Resp 16   Wt 178 lb (80.7 kg)   SpO2 96%   BMI 29.62 kg/m  Wt Readings from Last 3 Encounters:  07/17/21 178 lb (80.7 kg)  07/11/21 178 lb 9.6 oz (81 kg)  05/10/21 174 lb (78.9 kg)     Health Maintenance Due  Topic Date Due   Zoster Vaccines- Shingrix (1 of 2) Never done   COVID-19 Vaccine (4 - Booster for Pfizer series) 03/01/2021    There are no preventive care reminders to display for this patient.  Lab Results  Component Value Date   TSH 2.270 08/21/2017   Lab Results  Component Value Date   WBC 5.8 12/15/2020   HGB 13.2 12/15/2020   HCT 39.1 12/15/2020   MCV 90 12/15/2020   PLT 228 12/15/2020   Lab Results  Component Value Date   NA 142 12/15/2020   K 4.4 12/15/2020   CO2 23 12/15/2020   GLUCOSE 101 (H) 12/15/2020   BUN 23 12/15/2020   CREATININE 1.16 12/15/2020   BILITOT 0.9 12/22/2019   ALKPHOS 120 (H) 12/22/2019   AST 24 12/22/2019   ALT 15 12/22/2019   PROT 6.1 12/22/2019    ALBUMIN 3.8 12/22/2019   CALCIUM 8.7 12/15/2020   Lab Results  Component Value Date   CHOL 165 06/01/2020   Lab Results  Component Value Date   HDL 51 06/01/2020   Lab Results  Component Value Date   LDLCALC 97 06/01/2020   Lab Results  Component Value Date   TRIG 91 06/01/2020   Lab Results  Component Value Date   CHOLHDL 3.2 06/01/2020   No results found for: HGBA1C    Assessment & Plan:   Problem List Items Addressed This Visit       Other   Lumbar back pain    Persistent lumbar back pain with radiation to both knees posterior  Follow-up 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   Follow-up: Return in about 6 weeks (around 08/28/2021).    Shan Levans, MD

## 2021-07-25 ENCOUNTER — Other Ambulatory Visit: Payer: Self-pay

## 2021-07-26 ENCOUNTER — Other Ambulatory Visit: Payer: Self-pay

## 2021-07-28 ENCOUNTER — Encounter: Payer: Self-pay | Admitting: Orthopaedic Surgery

## 2021-07-28 ENCOUNTER — Ambulatory Visit (INDEPENDENT_AMBULATORY_CARE_PROVIDER_SITE_OTHER): Payer: Medicare Other | Admitting: Orthopaedic Surgery

## 2021-07-28 ENCOUNTER — Other Ambulatory Visit: Payer: Self-pay

## 2021-07-28 VITALS — BP 113/76 | Ht 65.0 in | Wt 178.0 lb

## 2021-07-28 DIAGNOSIS — M545 Low back pain, unspecified: Secondary | ICD-10-CM

## 2021-07-28 DIAGNOSIS — G8929 Other chronic pain: Secondary | ICD-10-CM | POA: Diagnosis not present

## 2021-07-31 NOTE — Progress Notes (Signed)
Office Visit Note   Patient: Sean Ward           Date of Birth: 06/29/1941           MRN: [id number] Visit Date: 07/28/2021              Requested by: [name], MD [address] [place],  [place] [address] PCP: [name], MD   Assessment & Plan: Visit Diagnoses:  1. Chronic bilateral low back pain, unspecified whether sciatica present     Plan: Patient has some anterolisthesis normally would be labeled L4-5 however patient has 6 nonrib-bearing vertebrae and relabeled by radiology L5-L6.  Listhesis is second level from the bottom.  We will proceed with a MRI scan for his chronic back pain with anterolisthesis.  Follow-up after MRI scan.  Patient has positive nerve root signs he is on Eliquis and is at risk for epidural hematoma.  Follow-Up Instructions: No follow-ups on file.   Orders:  Orders Placed This Encounter  Procedures   MR Lumbar Spine w/o contrast   No orders of the defined types were placed in this encounter.     Procedures: No procedures performed   Clinical Data: No additional findings.   Subjective: Chief Complaint  Patient presents with   Neck - Pain   Lower Back - Pain    HPI 80 year old male seen with back and neck pain.  He states he has extensive pain that radiates from his back down to his knees right equal left.  Denies numbness or tingling in his feet.  Onset was worse in the last month.  He states he has been walking very slow.  He still having problems with pain in his neck it tends to come and go but is not as severe as his low back pain.  Patient has Parkinson's on Sinemet.  Also takes Eliquis for chronic atrial fibrillation.  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  No associated bowel bladder symptoms.  Patient not on narcotic medication for his pain.  Review of Systems all other systems noncontributory to HPI.   Objective: Vital Signs: BP 113/76   Ht 5\' 5"  (1.651 m)   Wt 178  lb (80.7 kg)   BMI 29.62 kg/m   Physical Exam Constitutional:      Appearance: He is well-developed.  HENT:     Head: Normocephalic and atraumatic.     Right Ear: External ear normal.     Left Ear: External ear normal.  Eyes:     Pupils: Pupils are equal, round, and reactive to light.  Neck:     Thyroid: No thyromegaly.     Trachea: No tracheal deviation.  Cardiovascular:     Rate and Rhythm: Normal rate.  Pulmonary:     Effort: Pulmonary effort is normal.     Breath sounds: No wheezing.  Abdominal:     General: Bowel sounds are normal.     Palpations: Abdomen is soft.  Musculoskeletal:     Cervical back: Neck supple.  Skin:    General: Skin is warm and dry.     Capillary Refill: Capillary refill takes less than 2 seconds.  Neurological:     Mental Status: He is alert and oriented to person, place, and time.  Psychiatric:        Behavior: Behavior normal.        Thought Content: Thought content normal.        Judgment: Judgment normal.  Ortho Exam patient has intact reflexes.  There are some sciatic notch tenderness right equal to left.  Hip flexion anterior tib gastrocsoleus are strong.  Palpable pedal pulses.  Specialty Comments:  No specialty comments available.  Imaging: CLINICAL DATA:  Scoliosis of the lumbar spine.  Back pain.   EXAM: LUMBAR SPINE - COMPLETE 4+ VIEW   COMPARISON:  Lumbar radiograph 02/25/2020   FINDINGS: There are 6 non-rib-bearing lumbar vertebra. Prior thoracic spine radiographs demonstrates 12 pairs of ribs. The lower most lumbar vertebra will be labeled L6. Unchanged lower thoracic and lumbar scoliosis with 29 degrees dextroscoliotic curvature centered at L1-L2. 5 mm anterolisthesis of L5 on L6. Diffuse degenerative disc disease with disc space narrowing and endplate spurring most prominent at L1-L2 and L2-L3, as well as L3-L4 and L5-S1. Multilevel facet hypertrophy, prominent at L5-L6 and L6-S1. No evidence of fracture, bony  destruction, or focal bone abnormality. Sacroiliac joints are congruent.   IMPRESSION: 1. 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 2. Unchanged lower thoracic and lumbar. 3. 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     Electronically Signed   By: Narda Rutherford M.D.   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   History of bronchitis    Inguinal hernia, right 04/08/2014   Parkinson's disease (HCC)    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HCC)    Scoliosis of cervicothoracic spine    Scoliosis of thoracolumbar spine    Spondylosis of lumbar spine     Family History  Problem Relation Age of Onset   Deafness Mother    Deafness Sister    Heart disease Neg Hx     Past Surgical History:  Procedure Laterality Date   HERNIA REPAIR     TONSILLECTOMY     Social History   Occupational History   Not on file  Tobacco Use   Smoking status: Never   Smokeless tobacco: Never  Vaping Use   Vaping Use: Never used  Substance and Sexual Activity   Alcohol use: Yes    Alcohol/week: 7.0 standard drinks    Types: 7 Glasses of wine per week    Comment: 1 glass of red wine with dinner   Drug use: No   Sexual activity: Not Currently

## 2021-08-04 DIAGNOSIS — H02831 Dermatochalasis of right upper eyelid: Secondary | ICD-10-CM | POA: Insufficient documentation

## 2021-08-04 DIAGNOSIS — H02834 Dermatochalasis of left upper eyelid: Secondary | ICD-10-CM | POA: Insufficient documentation

## 2021-08-11 ENCOUNTER — Other Ambulatory Visit: Payer: Self-pay

## 2021-08-15 ENCOUNTER — Telehealth: Payer: Self-pay | Admitting: Orthopaedic Surgery

## 2021-08-22 ENCOUNTER — Other Ambulatory Visit: Payer: Self-pay

## 2021-08-23 ENCOUNTER — Ambulatory Visit: Payer: Medicare Other

## 2021-08-24 ENCOUNTER — Other Ambulatory Visit: Payer: Self-pay

## 2021-08-28 ENCOUNTER — Encounter: Payer: Self-pay | Admitting: Critical Care Medicine

## 2021-08-28 ENCOUNTER — Other Ambulatory Visit: Payer: Medicare Other

## 2021-08-29 ENCOUNTER — Ambulatory Visit: Payer: Medicare Other | Admitting: Critical Care Medicine

## 2021-08-30 ENCOUNTER — Other Ambulatory Visit: Payer: Self-pay

## 2021-09-06 ENCOUNTER — Ambulatory Visit: Payer: Medicare Other

## 2021-09-07 ENCOUNTER — Ambulatory Visit: Payer: Medicare Other | Admitting: Critical Care Medicine

## 2021-09-07 ENCOUNTER — Ambulatory Visit: Payer: Self-pay | Admitting: *Deleted

## 2021-09-07 NOTE — Telephone Encounter (Signed)
Patient called to cancel appt this am per agent , Melody due to inability or difficulty walking. Attempted to transfer call to NT and patient hung up or disconnected. NT attempted to call patient back #[phone] to review systems. No answer, left message on voicemail to call clinic back at #[phone].

## 2021-09-07 NOTE — Telephone Encounter (Signed)
Spoke with patient to offer a virtual visit but patient declined. Patient is rescheduled to come in person on 11/23

## 2021-09-07 NOTE — Progress Notes (Incomplete)
Established Patient Office Visit  Subjective:  Patient ID: Sean Ward, male    DOB: [birth year]  Age: 80 y.o. MRN: [id number]  CC: No chief complaint on file.   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   History of bronchitis    Inguinal hernia, right 04/08/2014   Parkinson's disease (HCC)    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HCC)    Scoliosis of cervicothoracic spine    Scoliosis of thoracolumbar spine    Spondylosis of lumbar spine     Past Surgical History:  Procedure Laterality Date   HERNIA REPAIR     TONSILLECTOMY      Family History  Problem Relation Age of Onset   Deafness Mother    Deafness Sister    Heart disease Neg Hx     Social History   Socioeconomic History   Marital status: Legally Separated    Spouse name: Not on file   Number of children: 2   Years of education: BA   Highest education level: Not on file  Occupational History   Not on file  Tobacco Use   Smoking status: Never   Smokeless tobacco: Never  Vaping Use   Vaping Use: Never used  Substance and Sexual Activity   Alcohol use: Yes    Alcohol/week: 7.0 standard drinks    Types: 7 Glasses of wine per week    Comment: 1 glass of red wine with dinner   Drug use: No   Sexual activity: Not Currently  Other Topics Concern   Not on file  Social History Narrative   Drinks 2 cups of coffee a day    Social Determinants of Health   Financial Resource Strain: Not on file  Food Insecurity: Not on file  Transportation Needs: Not on file  Physical Activity: Not on file  Stress: Not on file  Social Connections: Not on file  Intimate Partner Violence: Not on file    Outpatient Medications Prior to Visit  Medication Sig Dispense Refill   apixaban (ELIQUIS) 5 MG TABS  tablet TAKE 1 TABLET (5 MG TOTAL) BY MOUTH 2 (TWO) TIMES DAILY. 180 tablet 1   carbidopa-levodopa (SINEMET CR) 50-200 MG tablet TAKE 1 TABLET BY MOUTH AT BEDTIME. 90 tablet 3   carbidopa-levodopa (SINEMET IR) 25-100 MG tablet TAKE 1 & 1/2 TABLETS BY  MOUTH 5 TIMES A DAY, AT 8 AM, 11 AM, 2 PM, 5 PM AND 8 PM. 680 tablet 3   latanoprost (XALATAN) 0.005 % ophthalmic solution APPLY 1 DROP TO EYE NIGHTLY. 12.5 mL 3   Multiple Vitamin (MULTIVITAMIN) tablet Take 1 tablet by mouth daily.     propranolol ER (INDERAL LA) 60 MG 24 hr capsule TAKE 1 CAPSULE (60 MG TOTAL) BY MOUTH DAILY. 90 capsule 3   No facility-administered medications prior to visit.    No Known Allergies  ROS Review of Systems    Objective:    Physical Exam  There were no vitals taken for this visit. Wt Readings from Last 3 Encounters:  07/28/21 178 lb (80.7 kg)  07/17/21 178 lb (80.7 kg)  07/11/21 178 lb 9.6 oz (81 kg)     Health Maintenance Due  Topic Date Due   Pneumonia Vaccine 53+ Years old (1 - PCV) Never done   Zoster Vaccines- Shingrix (  1 of 2) Never done   COVID-19 Vaccine (4 - Booster for Pfizer series) 02/01/2021    There are no preventive care reminders to display for this patient.  Lab Results  Component Value Date   TSH 2.270 08/21/2017   Lab Results  Component Value Date   WBC 5.8 12/15/2020   HGB 13.2 12/15/2020   HCT 39.1 12/15/2020   MCV 90 12/15/2020   PLT 228 12/15/2020   Lab Results  Component Value Date   NA 142 12/15/2020   K 4.4 12/15/2020   CO2 23 12/15/2020   GLUCOSE 101 (H) 12/15/2020   BUN 23 12/15/2020   CREATININE 1.16 12/15/2020   BILITOT 0.9 12/22/2019   ALKPHOS 120 (H) 12/22/2019   AST 24 12/22/2019   ALT 15 12/22/2019   PROT 6.1 12/22/2019   ALBUMIN 3.8 12/22/2019   CALCIUM 8.7 12/15/2020   Lab Results  Component Value Date   CHOL 165 06/01/2020   Lab Results  Component Value Date   HDL 51 06/01/2020   Lab Results  Component Value Date    LDLCALC 97 06/01/2020   Lab Results  Component Value Date   TRIG 91 06/01/2020   Lab Results  Component Value Date   CHOLHDL 3.2 06/01/2020   No results found for: HGBA1C    Assessment & Plan:   Problem List Items Addressed This Visit   None   No orders of the defined types were placed in this encounter.   Follow-up: No follow-ups on file.    Shan Levans, MD

## 2021-09-10 ENCOUNTER — Other Ambulatory Visit: Payer: Self-pay | Admitting: Critical Care Medicine

## 2021-09-11 ENCOUNTER — Other Ambulatory Visit: Payer: Self-pay

## 2021-09-11 MED ORDER — APIXABAN 5 MG PO TABS
ORAL_TABLET | Freq: Two times a day (BID) | ORAL | 1 refills | Status: DC
  Filled 2021-09-11: qty 180, fill #0
  Filled 2021-11-09: qty 60, 30d supply, fill #0
  Filled 2021-11-09: qty 180, fill #0
  Filled 2021-12-07: qty 60, 30d supply, fill #1
  Filled 2022-01-09: qty 60, 30d supply, fill #2
  Filled 2022-02-07: qty 60, 30d supply, fill #3
  Filled 2022-02-08: qty 30, 15d supply, fill #3
  Filled 2022-02-23: qty 30, 15d supply, fill #4
  Filled 2022-03-05: qty 120, 60d supply, fill #5

## 2021-09-11 NOTE — Telephone Encounter (Signed)
Requested Prescriptions  Pending Prescriptions Disp Refills  . apixaban (ELIQUIS) 5 MG TABS tablet 180 tablet 1    Sig: TAKE 1 TABLET (5 MG TOTAL) BY MOUTH 2 (TWO) TIMES DAILY.     Hematology:  Anticoagulants Passed - 09/10/2021  9:07 PM      Passed - HGB in normal range and within 360 days    Hemoglobin  Date Value Ref Range Status  12/15/2020 13.2 13.0 - 17.7 g/dL Final         Passed - PLT in normal range and within 360 days    Platelets  Date Value Ref Range Status  12/15/2020 228 150 - 450 x10E3/uL Final         Passed - HCT in normal range and within 360 days    Hematocrit  Date Value Ref Range Status  12/15/2020 39.1 37.5 - 51.0 % Final         Passed - Cr in normal range and within 360 days    Creat  Date Value Ref Range Status  05/10/2016 1.11 0.70 - 1.18 mg/dL Final    Comment:      For patients > or = 79 years of age: The upper reference limit for Creatinine is approximately 13% higher for people identified as African-American.      Creatinine, Ser  Date Value Ref Range Status  12/15/2020 1.16 0.76 - 1.27 mg/dL Final    Comment:                   **Effective December 19, 2020 [facility] will begin**                  reporting the 2021 CKD-EPI creatinine equation that                  estimates kidney function without a race variable.          Passed - Valid encounter within last 12 months    Recent Outpatient Visits          1 month ago Lumbar back pain   [facility] [name], MD   4 months ago Persistent atrial fibrillation [facility])   [place] [facility] [name], MD   9 months ago Persistent atrial fibrillation [facility])   [place] [facility] [name], MD   11 months ago Right arm pain   [place] [address] [facility] [place], [place], [place]   1 year ago Persistent atrial fibrillation [facility])   [facility] [name], MD      Future Appointments            In 2 days [name], MD [facility]   In 3 months [place], Donnie Coffin, MD [facility], [facility]

## 2021-09-12 ENCOUNTER — Other Ambulatory Visit: Payer: Self-pay

## 2021-09-13 ENCOUNTER — Ambulatory Visit: Payer: Medicare Other | Admitting: Critical Care Medicine

## 2021-09-18 ENCOUNTER — Other Ambulatory Visit: Payer: Self-pay

## 2021-09-18 ENCOUNTER — Other Ambulatory Visit: Payer: Medicare Other

## 2021-09-18 MED ORDER — LATANOPROST 0.005 % OP SOLN
OPHTHALMIC | 3 refills | Status: DC
  Filled 2021-09-18: qty 2.5, 20d supply, fill #0
  Filled 2021-09-29: qty 2.5, 25d supply, fill #0
  Filled 2021-11-30: qty 2.5, 18d supply, fill #0
  Filled 2021-12-07: qty 2.5, 20d supply, fill #0

## 2021-09-19 ENCOUNTER — Other Ambulatory Visit: Payer: Self-pay

## 2021-09-21 ENCOUNTER — Ambulatory Visit: Payer: Medicare Other

## 2021-09-22 ENCOUNTER — Other Ambulatory Visit: Payer: Self-pay

## 2021-09-26 ENCOUNTER — Other Ambulatory Visit: Payer: Self-pay

## 2021-09-27 ENCOUNTER — Ambulatory Visit: Payer: Medicare Other | Admitting: Physician Assistant

## 2021-09-27 ENCOUNTER — Other Ambulatory Visit: Payer: Self-pay

## 2021-09-29 ENCOUNTER — Other Ambulatory Visit: Payer: Self-pay

## 2021-10-02 ENCOUNTER — Other Ambulatory Visit: Payer: Self-pay

## 2021-10-09 ENCOUNTER — Other Ambulatory Visit: Payer: Self-pay

## 2021-10-10 ENCOUNTER — Other Ambulatory Visit: Payer: Self-pay

## 2021-10-11 ENCOUNTER — Other Ambulatory Visit: Payer: Self-pay

## 2021-10-17 ENCOUNTER — Other Ambulatory Visit: Payer: Self-pay

## 2021-10-19 ENCOUNTER — Other Ambulatory Visit: Payer: Self-pay

## 2021-10-20 ENCOUNTER — Other Ambulatory Visit: Payer: Self-pay

## 2021-10-26 ENCOUNTER — Other Ambulatory Visit (HOSPITAL_COMMUNITY): Payer: Self-pay

## 2021-10-26 ENCOUNTER — Other Ambulatory Visit: Payer: Self-pay

## 2021-10-30 ENCOUNTER — Other Ambulatory Visit (HOSPITAL_COMMUNITY): Payer: Self-pay

## 2021-10-31 ENCOUNTER — Other Ambulatory Visit (HOSPITAL_COMMUNITY): Payer: Self-pay

## 2021-11-01 ENCOUNTER — Ambulatory Visit: Payer: Medicare Other | Admitting: Neurology

## 2021-11-09 ENCOUNTER — Other Ambulatory Visit: Payer: Self-pay

## 2021-11-09 ENCOUNTER — Ambulatory Visit: Payer: Medicare Other | Admitting: Critical Care Medicine

## 2021-11-10 ENCOUNTER — Other Ambulatory Visit: Payer: Self-pay

## 2021-11-12 NOTE — Progress Notes (Incomplete)
Established Patient Office Visit  Subjective:  Patient ID: Sean Ward, male    DOB: 05/26/1941  Age: 81 y.o. MRN: [id number]  CC: No chief complaint on file.   HPI Sean Ward presents for   Pcv 20 saw ortho who ordered MRI lumbar 07/2021  not yet done.  ? Injections? 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   History of bronchitis    Inguinal hernia, right 04/08/2014   Parkinson's disease ([place])    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HCC)    Scoliosis of cervicothoracic spine    Scoliosis of thoracolumbar spine    Spondylosis of lumbar spine     Past Surgical History:  Procedure Laterality Date   HERNIA REPAIR     TONSILLECTOMY      Family History  Problem Relation Age of Onset   Deafness Mother    Deafness Sister    Heart disease Neg Hx     Social History   Socioeconomic History   Marital status: Legally Separated    Spouse name: Not on file   Number of children: 2   Years of education: BA   Highest education level: Not on file  Occupational History   Not on file  Tobacco Use   Smoking status: Never   Smokeless tobacco: Never  Vaping Use   Vaping Use: Never used  Substance and Sexual Activity   Alcohol use: Yes    Alcohol/week: 7.0 standard drinks    Types: 7 Glasses of wine per week    Comment: 1 glass of red wine with dinner   Drug use: No   Sexual activity: Not Currently  Other Topics Concern   Not on file  Social History Narrative   Drinks 2 cups of coffee a day    Social Determinants of Health   Financial Resource Strain: Not on file  Food Insecurity: Not on file  Transportation Needs: Not on file  Physical Activity: Not on file  Stress: Not on file  Social Connections: Not on file  Intimate Partner Violence: Not on file    Outpatient Medications Prior to  Visit  Medication Sig Dispense Refill   apixaban (ELIQUIS) 5 MG TABS tablet TAKE 1 TABLET (5 MG TOTAL) BY MOUTH 2 (TWO) TIMES DAILY. 180 tablet 1   carbidopa-levodopa (SINEMET CR) 50-200 MG tablet TAKE 1 TABLET BY MOUTH AT BEDTIME. 90 tablet 3   carbidopa-levodopa (SINEMET IR) 25-100 MG tablet TAKE 1 & 1/2 TABLETS BY  MOUTH 5 TIMES A DAY, AT 8 AM, 11 AM, 2 PM, 5 PM AND 8 PM. 680 tablet 3   latanoprost (XALATAN) 0.005 % ophthalmic solution APPLY 1 DROP TO EYE NIGHTLY. 12.5 mL 3   latanoprost (XALATAN) 0.005 % ophthalmic solution Place 1 drop into both eyes nightly. 2.5 mL 3   Multiple Vitamin (MULTIVITAMIN) tablet Take 1 tablet by mouth daily.     propranolol ER (INDERAL LA) 60 MG 24 hr capsule TAKE 1 CAPSULE (60 MG TOTAL) BY MOUTH DAILY. 90 capsule 3   No facility-administered medications prior to visit.    No Known Allergies  ROS Review of Systems    Objective:    Physical Exam  There were no vitals taken for this visit. Wt Readings from Last 3 Encounters:  07/28/21 178 lb (80.7 kg)  07/17/21 178 lb (80.7 kg)  07/11/21 178  lb 9.6 oz (81 kg)     Health Maintenance Due  Topic Date Due   Pneumonia Vaccine 52+ Years old (1 - PCV) Never done   Zoster Vaccines- Shingrix (1 of 2) Never done   COVID-19 Vaccine (4 - Booster for Pfizer series) 02/01/2021    There are no preventive care reminders to display for this patient.  Lab Results  Component Value Date   TSH 2.270 08/21/2017   Lab Results  Component Value Date   WBC 5.8 12/15/2020   HGB 13.2 12/15/2020   HCT 39.1 12/15/2020   MCV 90 12/15/2020   PLT 228 12/15/2020   Lab Results  Component Value Date   NA 142 12/15/2020   K 4.4 12/15/2020   CO2 23 12/15/2020   GLUCOSE 101 (H) 12/15/2020   BUN 23 12/15/2020   CREATININE 1.16 12/15/2020   BILITOT 0.9 12/22/2019   ALKPHOS 120 (H) 12/22/2019   AST 24 12/22/2019   ALT 15 12/22/2019   PROT 6.1 12/22/2019   ALBUMIN 3.8 12/22/2019   CALCIUM 8.7  12/15/2020   Lab Results  Component Value Date   CHOL 165 06/01/2020   Lab Results  Component Value Date   HDL 51 06/01/2020   Lab Results  Component Value Date   LDLCALC 97 06/01/2020   Lab Results  Component Value Date   TRIG 91 06/01/2020   Lab Results  Component Value Date   CHOLHDL 3.2 06/01/2020   No results found for: HGBA1C    Assessment & Plan:   Problem List Items Addressed This Visit   None   No orders of the defined types were placed in this encounter.   Follow-up: No follow-ups on file.    [name], MD

## 2021-11-13 ENCOUNTER — Ambulatory Visit: Payer: Medicare Other | Admitting: Critical Care Medicine

## 2021-11-13 ENCOUNTER — Other Ambulatory Visit: Payer: Self-pay

## 2021-11-13 DIAGNOSIS — L409 Psoriasis, unspecified: Secondary | ICD-10-CM | POA: Insufficient documentation

## 2021-11-21 ENCOUNTER — Other Ambulatory Visit: Payer: Self-pay

## 2021-11-23 ENCOUNTER — Other Ambulatory Visit: Payer: Self-pay

## 2021-11-29 ENCOUNTER — Other Ambulatory Visit: Payer: Self-pay

## 2021-11-30 ENCOUNTER — Other Ambulatory Visit: Payer: Self-pay

## 2021-12-04 ENCOUNTER — Ambulatory Visit: Payer: Medicare Other | Admitting: Neurology

## 2021-12-07 ENCOUNTER — Ambulatory Visit: Payer: Medicare Other | Admitting: Neurology

## 2021-12-07 ENCOUNTER — Other Ambulatory Visit: Payer: Self-pay

## 2021-12-07 ENCOUNTER — Encounter: Payer: Self-pay | Admitting: Neurology

## 2021-12-07 VITALS — BP 111/70 | HR 71 | Ht 68.0 in | Wt 182.0 lb

## 2021-12-07 DIAGNOSIS — G20 Parkinson's disease: Secondary | ICD-10-CM | POA: Diagnosis not present

## 2021-12-07 NOTE — Patient Instructions (Signed)
Please follow up with Dr. Ophelia Charter after pursuing your spine MRI.  I recommend that you start using a cane for gait safety.  I think a stationary bike would be a good exercise for you, you can also work on your strength by using resistance bands. You can continue with your generic Sinemet and Sinemet long-acting as previously prescribed, your prescription is up-to-date for now.  Your primary care physician refilled your prescriptions. Please follow-up routinely in 6 months, sooner if needed. I would encourage you to get your COVID booster shot.  You could also talk to your primary care about this.

## 2021-12-07 NOTE — Progress Notes (Signed)
Subjective:    Patient ID: Sean Ward is a 82 y.o. male.  HPI    Interim history:   Sean Ward is a 81 year old right-handed gentleman with an underlying medical history of bronchitis, PAF, glaucoma and osteoarthritis, who presents for follow-up consultation of his parkinsonism. The patient is unaccompanied today.  I last saw him on 04/19/2021, at which time he reported ongoing issues with low back pain.  Otherwise, from the Parkinson's standpoint he felt stable, he was trying to get better with his hydration.  He was taking Sinemet 1-1/2 pills 5 times a day and Sinemet CR at bedtime.  He was planning a trip to [place] in September 2022.  We mutually agreed to continue with his medication regimen.   Today, 12/07/2021: He reports not being able to walk as fast as he used to.  He has not fallen, thankfully, still has low back pain, denies any constipation, does not use a walking aid. He saw Dr. Lorin Mercy with ortho on 07/28/2021 and a lumbar spine MRI was ordered.  He reports that he did not pursue the MRI but is considering it now.  He is planning a trip overseas in May 2023, he reports that he has not received any booster shot for his COVID.  He used to ride his bike a lot, he is considering to use a stationary bike for exercise.   The patient's allergies, current medications, family history, past medical history, past social history, past surgical history and problem list were reviewed and updated as appropriate.   Previously (copied from previous notes for reference):     I saw him on 10/19/2020, at which time he reported feeling fairly stable.  He had not experienced any recent falls.  He complained of back pain and saw his orthopedic specialist who recommended an MRI but he had not pursued it yet.  He was planning to travel to [place] soon.  He was taking Sinemet 1-1/2 pills 5 times a day as well as Sinemet CR at bedtime.  He was advised to continue with his medication regimen.  He was reminded  to use his cane consistently.     I saw him on 06/15/2020, at which time he reported feeling fairly stable.  He was on Sinemet 1-1/2 pills 5 times a day and Sinemet CR 1 pill at bedtime.  He had right knee pain.  He had received cortisone injections.  He had seen orthopedics.  He was encouraged to use a cane for safety.  He was contemplating moving to [place] or staying there for 6 months at a time.   I saw him on 03/09/2020 after a longer gap of over 1-1/2 years.  He had a recent fall.  He reported feeling worse.  His exam showed progression.  He was advised to increase his Sinemet to 1-1/2 pills 5 times a day and also add a Sinemet CR at bedtime.  Furthermore, he was advised to increase his melatonin to 5 mg at night. He was advised to start using a cane consistently and consider using a walker.       He missed a virtual visit appointment on 03/12/2019 and a face to face appointment on 05/07/19. I saw him on 08/27/2018, at which time he reported more tremor in the mornings, on the left side. He was keeping his house very cold, does not like to use the heat in the house and it has been in the 40s in the house in the mornings. He was supposed to  see orthopedics for left-sided low back pain with radiation to the left thigh.       I saw him on 02/24/2018, at which time he reported doing okay, thankfully no falls. He reported occasional dizziness and lightheadedness. His blood pressure was trending lower. He was tolerating the Eliquis, has had lower extremity swelling, also right knee pain. He was advised by cardiology to use compression socks, but was not wearing them regularly. He was planning a trip to [place]. He was advised to continue with Sinemet 1-1/2 pills 4 times a day.   (He) had to miss an appointment on 02/13/2018. I saw him on 08/15/2017, at which time he had no acute issues but overall was not as active. His balance was not as good. He felt that his legs are heavier. He was taking Sinemet one  and half pill 3 times a day, I suggested we increase it to 4 times a day. His heart rate and cardiac auscultation suggested significant irregularities and I suggested he follow-up with cardiology.   Of note, he missed an appointment on 08/14/2017. I saw him on 02/12/2017, at which time he reported doing okay, had a recent trip to [place]. He was trying to stay active, felt like his right leg was weaker than the left. 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 He had seen a chiropractor for this about 3 years prior. He was taking melatonin first jet lag. He was trying hydrate well. He felt that the Sinemet was working well for him, reported no side effects, was taking 1-1/2 pills 3 times a day. I suggested a six-month follow-up.   I saw him on 07/24/2016, at which time he reported tolerating the Sinemet and he felt that the tremor was less. He planned a trip to [place] for 6 days. He had no recent falls. He was not exercising on a regular basis. He reported a stable mood and memory. We talked about healthy lifestyle in the importance of exercise. I suggested he increase his Sinemet to 1-1/2 pills 3 times a day.     I first met him on 06/06/2016 at the request of his primary care physician, at which time he reported an upper extremity tremor on the left frontal past 6-12 months and more recently in the right upper extremity. On examination, he had signs and keeping with parkinsonism, with left-sided predominance. I suggested we start him on Sinemet 25-100 milligrams strength half a pill twice a day with gradual titration to 1 pill 3 times a day. I ordered a brain MRI wo contrast. He had this on 06/18/16: IMPRESSION:  This MRI of the brain without contrast shows the following: 1.    Scattered T2/FLAIR hyperintense foci in the subcortical, deep and periventricular white matter in a pattern most consistent with mild chronic microvascular ischemic change.   None of  the foci appears to be acute. 2.    There are no acute findings.   We called him with the results.   06/06/2016: He has had an upper extremity tremor for the past 6-12 months, unclear exactly how long and tremor started on the L, now also on the R side about a month ago. Leg tremors started about 2-3 months ago. 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 I reviewed your office note from 05/24/2016. You discontinued metoprolol and started him on propranolol long-acting 60 mg once daily. He is physically active, but not so much  lately. He plays tennis and rides a bike, but not able to lately. He was a Sean Ward, still does some contract work. He drinks wine with dinner, 1-2 glasses. Tremor is less post alcohol. He drinks coffee about 3 cups per day, otherwise water, about 8 glasses. He has swelling of both LEs, for the past 6+ months, was on Lasix, but no longer. He has been on a baby ASA. He lives alone in an apartment and has to navigate the one flight of stairs. Sean Ward once about 3 months ago, in his apartment onto his L side, no head injury, no LOC, sleeps more during the day in the afternoon, which helps. He is separated (going through divorce), has one daughter and one son, both live in [place], [place]. He is a non-smoker, no FHx of tremors, or PD. He feels like he has a tendency to fall backwards. He has only fallen once in the past year, onto his left side, thankfully no injury as mentioned. Memory is fairly good, he denies any mood related issues, he does worry about his tremor. He is planning an overseas trip by the end of September.   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   History of bronchitis    Inguinal hernia, right 04/08/2014   Parkinson's disease ([facility])    Persistent atrial  fibrillation ([facility])    Scoliosis of cervicothoracic spine    Scoliosis of thoracolumbar spine    Spondylosis of lumbar spine     His Past Surgical History Is Significant For: Past Surgical History:  Procedure Laterality Date   HERNIA REPAIR     TONSILLECTOMY      His Family History Is Significant For: Family History  Problem Relation Age of Onset   Deafness Mother    Deafness Sister    Heart disease Neg Hx     His Social History Is Significant For: Social History   Socioeconomic History   Marital status: Legally Separated    Spouse name: Not on file   Number of children: 2   Years of education: BA   Highest education level: Not on file  Occupational History   Not on file  Tobacco Use   Smoking status: Never   Smokeless tobacco: Never  Vaping Use   Vaping Use: Never used  Substance and Sexual Activity   Alcohol use: Yes    Alcohol/week: 3.0 standard drinks    Types: 3 Glasses of wine per week    Comment: 1 glass of red wine with dinner some nights   Drug use: No   Sexual activity: Not Currently  Other Topics Concern   Not on file  Social History Narrative   Drinks 2 cups of coffee a day plus tea, 3 cup/day   Social Determinants of Health   Financial Resource Strain: Not on file  Food Insecurity: Not on file  Transportation Needs: Not on file  Physical Activity: Not on file  Stress: Not on file  Social Connections: Not on file    His Allergies Are:  No Known Allergies:   His Current Medications Are:  Outpatient Encounter Medications as of 12/07/2021  Medication Sig   apixaban (ELIQUIS) 5 MG TABS tablet TAKE 1 TABLET (5 MG TOTAL) BY MOUTH 2 (TWO) TIMES DAILY.   aspirin 325 MG tablet  1 tablet   carbidopa-levodopa (SINEMET CR) 50-200 MG tablet TAKE 1 TABLET BY MOUTH AT BEDTIME.   carbidopa-levodopa (SINEMET IR) 25-100 MG tablet TAKE 1 & 1/2 TABLETS BY  MOUTH 5 TIMES A DAY, AT 8 AM, 11 AM, 2 PM, 5 PM AND 8 PM.   latanoprost (XALATAN) 0.005 % ophthalmic  solution APPLY 1 DROP TO EYE NIGHTLY.   latanoprost (XALATAN) 0.005 % ophthalmic solution Place 1 drop into both eyes nightly.   Multiple Vitamin (MULTIVITAMIN) tablet Take 1 tablet by mouth daily.   propranolol ER (INDERAL LA) 60 MG 24 hr capsule TAKE 1 CAPSULE (60 MG TOTAL) BY MOUTH DAILY.   No facility-administered encounter medications on file as of 12/07/2021.  :  Review of Systems:  Out of a complete 14 point review of systems, all are reviewed and negative with the exception of these symptoms as listed below:  Review of Systems  Neurological:        Patient is here for a 6 month follow-up. He feels his symptoms are and aren't stable compared to his last visit. He states he cannot walk fast and he loses his balance. He has not fallen in the past year.    Objective:  Neurological Exam  Physical Exam Physical Examination:   Vitals:   12/07/21 1324  BP: 111/70  Pulse: 71    General Examination: The patient is a very pleasant 81 y.o. male in no acute distress. He appears well-developed and well-nourished and well groomed.   HEENT: Normocephalic, atraumatic, pupils are equal, round and reactive to light, extraocular tracking shows mild saccadic breakdown.  Mild to moderate facial masking, slight left ptosis but stable.  Moderate nuchal rigidity, stable.  No significant lower jaw or lip tremor today.  No significant sialorrhea.  Hearing is grossly intact.  Tongue protrudes centrally and palate elevates symmetrically.   Speech is moderately hypophonic, no obvious dysarthria noted today.     Chest: Clear to auscultation without wheezing, rhonchi or crackles noted.   Heart: S1+S2+0, mildly irregular.      Abdomen: Soft, non-tender and non-distended with normal bowel sounds appreciated on auscultation.   Extremities: There is trace to 1+ edema in the distal lower extremities, left more than right.   Skin: Warm and dry without trophic changes noted.    Musculoskeletal: exam  reveals no obvious joint deformities.  Increased mid back curvature.   Neurologically: Mental status: The patient is awake, alert and oriented in all 4 spheres. His immediate and remote memory, attention, language skills and fund of knowledge are appropriate, but he does some slowness in thinking and word finding difficulties at times.  Thought process is linear. Mood is normal and affect are good. Cranial nerves II - XII are as described above under HEENT exam. Motor exam: Normal bulk, and strength for age, tone is mildly increased. He has an intermittent mild resting tremor in the left upper extremity. Fine motor skills are moderately impaired on the left, slightly better on the right.  He stands up with difficulty and posture is moderately stooped, also has a forward tilt in the lower back and increased curvature of the upper back.  He walks with mild difficulty, no walking aid. More insecure with turns today. Balance is  mildly impaired but stable.     Assessment and Plan:  In summary, Sean Ward is a very pleasant 81 year old male with an underlying medical history of bronchitis, a fib, osteoarthritis, low back pain and mildly overweight state, who presents  for follow-up consultation of his left-sided predominant Parkinson's disease, with progression noted over time.  He has had decline in his mobility but also more issues with right knee arthritis and chronic back pain. He has had steroid injections and ultimately may need a knee replacement.  He is currently fairly stable on Sinemet 25-100 mg strength 1-1/2 pills 5 times a day and Sinemet CR 50-200 mg strength at bedtime.   We increased the Sinemet in May 2021, of this year and also added the ER at the time.  For gait safety, I suggested he start using a cane.  Thankfully, he has not fallen recently but his posture and his gait stability have become a little worse. His brain MRI without contrast on 06/18/16 showed chronic white matter changes, no  acute changes, no significant atrophy for age.  He is encouraged to continue with his current medication regimen.  He is advised to follow-up with his orthopedic surgeon and pursue the lumbar spine MRI that was ordered.  He is encouraged to get his COVID booster shot, he is advised to talk to his primary care about this as well.  His 2 Sinemet prescriptions are up-to-date, renewed recently by his primary care physician.  He is advised to follow-up routinely in this office in 6 months, sooner if needed.  I answered all his questions today and he was in agreement.

## 2021-12-11 ENCOUNTER — Other Ambulatory Visit: Payer: Self-pay

## 2021-12-12 ENCOUNTER — Other Ambulatory Visit: Payer: Self-pay

## 2021-12-20 ENCOUNTER — Ambulatory Visit: Payer: Medicare Other | Admitting: Critical Care Medicine

## 2021-12-20 ENCOUNTER — Other Ambulatory Visit: Payer: Self-pay

## 2021-12-20 NOTE — Progress Notes (Incomplete)
? ?Established Patient Office Visit ? ?Subjective:  ?Patient ID: Sean Ward, male    DOB: 07/14/1941  Age: 81 y.o. MRN: [id number] ? ?CC: No chief complaint on file. ? ? ?HPI ?Sean Ward presents for  ?Pcv 20 ? ?Past Medical History:  ?Diagnosis Date  ?? Anticoagulant long-term use   ?? Back pain with radiation   ?? Hernia, inguinal, left 05/03/2014  ?? 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 History of bronchitis   ?? Inguinal hernia, right 04/08/2014  ?? Parkinson's disease (Sean Ward)   ?? 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[facility])   ?? Scoliosis of cervicothoracic spine   ?? Scoliosis of thoracolumbar spine   ?? Spondylosis of lumbar spine   ? ? ?Past Surgical History:  ?Procedure Laterality Date  ?? HERNIA REPAIR    ?? TONSILLECTOMY    ? ? ?Family History  ?Problem Relation Age of Onset  ?? Deafness Mother   ?? Deafness Sister   ?? Heart disease Neg Hx   ? ? ?Social History  ? ?Socioeconomic History  ?? Marital status: Legally Separated  ?  Spouse name: Not on file  ?? Number of children: 2  ?? Years of education: BA  ?? Highest education level: Not on file  ?Occupational History  ?? Not on file  ?Tobacco Use  ?? Smoking status: Never  ?? Smokeless tobacco: Never  ?Vaping Use  ?? Vaping Use: Never used  ?Substance and Sexual Activity  ?? Alcohol use: Yes  ?  Alcohol/week: 3.0 standard drinks  ?  Types: 3 Glasses of wine per week  ?  Comment: 1 glass of red wine with dinner some nights  ?? Drug use: No  ?? Sexual activity: Not Currently  ?Other Topics Concern  ?? Not on file  ?Social History Narrative  ? Drinks 2 cups of coffee a day plus tea, 3 cup/day  ? ?Social Determinants of Health  ? ?Financial Resource Strain: Not on file  ?Food Insecurity: Not on file  ?Transportation Needs: Not on file  ?Physical Activity: Not on file  ?Stress: Not on file  ?Social Connections: Not on file  ?Intimate Partner Violence: Not on file  ? ? ?Outpatient Medications Prior to Visit  ?Medication Sig Dispense Refill  ??  apixaban (ELIQUIS) 5 MG TABS tablet TAKE 1 TABLET (5 MG TOTAL) BY MOUTH 2 (TWO) TIMES DAILY. 180 tablet 1  ?? aspirin 325 MG tablet 1 tablet    ?? carbidopa-levodopa (SINEMET CR) 50-200 MG tablet TAKE 1 TABLET BY MOUTH AT BEDTIME. 90 tablet 3  ?? carbidopa-levodopa (SINEMET IR) 25-100 MG tablet TAKE 1 & 1/2 TABLETS BY  MOUTH 5 TIMES A DAY, AT 8 AM, 11 AM, 2 PM, 5 PM AND 8 PM. 680 tablet 3  ?? latanoprost (XALATAN) 0.005 % ophthalmic solution APPLY 1 DROP TO EYE NIGHTLY. 12.5 mL 3  ?? latanoprost (XALATAN) 0.005 % ophthalmic solution Place 1 drop into both eyes nightly. 2.5 mL 3  ?? Multiple Vitamin (MULTIVITAMIN) tablet Take 1 tablet by mouth daily.    ?? propranolol ER (INDERAL LA) 60 MG 24 hr capsule TAKE 1 CAPSULE (60 MG TOTAL) BY MOUTH DAILY. 90 capsule 3  ? ?No facility-administered medications prior to visit.  ? ? ?No Known Allergies ? ?ROS ?Review of Systems ? ?  ?Objective:  ?  ?Physical Exam ? ?There were no vitals taken for this visit. ?Wt Readings from Last 3 Encounters:  ?12/07/21 182 lb (82.6 kg)  ?07/28/21 178 lb (80.7 kg)  ?07/17/21  178 lb (80.7 kg)  ? ? ? ?Health Maintenance Due  ?Topic Date Due  ?? Pneumonia Vaccine 71+ Years old (1 - PCV) Never done  ?? Zoster Vaccines- Shingrix (1 of 2) Never done  ?? COVID-19 Vaccine (4 - Booster for [place] series) 02/01/2021  ? ? ?There are no preventive care reminders to display for this patient. ? ?Lab Results  ?Component Value Date  ? TSH 2.270 08/21/2017  ? ?Lab Results  ?Component Value Date  ? WBC 5.8 12/15/2020  ? HGB 13.2 12/15/2020  ? HCT 39.1 12/15/2020  ? MCV 90 12/15/2020  ? PLT 228 12/15/2020  ? ?Lab Results  ?Component Value Date  ? NA 142 12/15/2020  ? K 4.4 12/15/2020  ? CO2 23 12/15/2020  ? GLUCOSE 101 (H) 12/15/2020  ? BUN 23 12/15/2020  ? CREATININE 1.16 12/15/2020  ? BILITOT 0.9 12/22/2019  ? ALKPHOS 120 (H) 12/22/2019  ? AST 24 12/22/2019  ? ALT 15 12/22/2019  ? PROT 6.1 12/22/2019  ? ALBUMIN 3.8 12/22/2019  ? CALCIUM 8.7 12/15/2020  ? ?Lab  Results  ?Component Value Date  ? CHOL 165 06/01/2020  ? ?Lab Results  ?Component Value Date  ? HDL 51 06/01/2020  ? ?Lab Results  ?Component Value Date  ? [place] 97 06/01/2020  ? ?Lab Results  ?Component Value Date  ? TRIG 91 06/01/2020  ? ?Lab Results  ?Component Value Date  ? CHOLHDL 3.2 06/01/2020  ? ?No results found for: HGBA1C ? ?  ?Assessment & Plan:  ? ?Problem List Items Addressed This Visit   ?None ? ? ?No orders of the defined types were placed in this encounter. ? ? ?Follow-up: No follow-ups on file.  ? ? ?[name], MD ?

## 2021-12-21 ENCOUNTER — Other Ambulatory Visit: Payer: Self-pay

## 2021-12-26 ENCOUNTER — Other Ambulatory Visit (HOSPITAL_COMMUNITY): Payer: Self-pay

## 2021-12-26 MED ORDER — LATANOPROST 0.005 % OP SOLN
OPHTHALMIC | 1 refills | Status: DC
  Filled 2021-12-26: qty 7.5, 75d supply, fill #0
  Filled 2022-01-01: qty 7.5, 60d supply, fill #0
  Filled 2022-01-10: qty 2.5, 20d supply, fill #0

## 2021-12-29 ENCOUNTER — Other Ambulatory Visit: Payer: Self-pay

## 2022-01-01 ENCOUNTER — Other Ambulatory Visit: Payer: Self-pay

## 2022-01-01 ENCOUNTER — Other Ambulatory Visit (HOSPITAL_COMMUNITY): Payer: Self-pay

## 2022-01-01 NOTE — Progress Notes (Incomplete)
Established Patient Office Visit  Subjective:  Patient ID: Sean Ward, male    DOB: 1940/11/20  Age: 81 y.o. MRN: [id number]  CC:  No chief complaint on file.   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  Patient states the pain is starts in the lumbar area and radiates down to the back of the leg to the knees.  He did see orthopedics previously for his right knee which was told he needed a total right knee replacement.  He denies any pain in the front parts of his knees.  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   History of bronchitis    Inguinal hernia, right 04/08/2014   Parkinson's disease (HCC)    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HCC)    Scoliosis of cervicothoracic spine    Scoliosis of thoracolumbar spine    Spondylosis of lumbar spine     Past Surgical History:  Procedure Laterality Date   HERNIA REPAIR     TONSILLECTOMY      Family History  Problem Relation Age of Onset   Deafness Mother    Deafness Sister    Heart disease Neg Hx     Social History   Socioeconomic History   Marital status: Legally Separated    Spouse name: Not on file   Number of children: 2   Years of education: BA   Highest education level: Not on file  Occupational History   Not on file  Tobacco Use   Smoking status: Never   Smokeless tobacco: Never  Vaping Use   Vaping Use: Never used  Substance and Sexual Activity   Alcohol use: Yes    Alcohol/week: 3.0 standard drinks    Types: 3 Glasses of wine per week    Comment: 1 glass of  red wine with dinner some nights   Drug use: No   Sexual activity: Not Currently  Other Topics Concern   Not on file  Social History Narrative   Drinks 2 cups of coffee a day plus tea, 3 cup/day   Social Determinants of Health   Financial Resource Strain: Not on file  Food Insecurity: Not on file  Transportation Needs: Not on file  Physical Activity: Not on file  Stress: Not on file  Social Connections: Not on file  Intimate Partner Violence: Not on file    Outpatient Medications Prior to Visit  Medication Sig Dispense Refill   apixaban (ELIQUIS) 5 MG TABS tablet TAKE 1 TABLET (5 MG TOTAL) BY MOUTH 2 (TWO) TIMES DAILY. 180 tablet 1   aspirin 325 MG tablet 1 tablet     carbidopa-levodopa (SINEMET CR) 50-200 MG tablet TAKE 1 TABLET BY MOUTH AT BEDTIME. 90 tablet 3   carbidopa-levodopa (SINEMET IR) 25-100 MG tablet TAKE 1 & 1/2 TABLETS BY  MOUTH 5 TIMES A DAY, AT 8 AM, 11 AM, 2 PM, 5 PM AND 8 PM. 680 tablet 3   latanoprost (XALATAN) 0.005 % ophthalmic solution APPLY 1 DROP TO EYE NIGHTLY. 12.5 mL 3   latanoprost (XALATAN) 0.005 %  ophthalmic solution Place 1 drop into both eyes nightly. 2.5 mL 3   latanoprost (XALATAN) 0.005 % ophthalmic solution Place 1 drop into both eyes nightly. 7.5 mL 1   Multiple Vitamin (MULTIVITAMIN) tablet Take 1 tablet by mouth daily.     propranolol ER (INDERAL LA) 60 MG 24 hr capsule TAKE 1 CAPSULE (60 MG TOTAL) BY MOUTH DAILY. 90 capsule 3   No facility-administered medications prior to visit.    No Known Allergies  ROS Review of Systems  Constitutional: Negative.   HENT: Negative.  Negative for ear pain, postnasal drip, rhinorrhea, sinus pressure, sore throat, trouble swallowing and voice change.   Eyes: Negative.   Respiratory: Negative.  Negative for apnea, cough, choking, chest tightness, shortness of breath, wheezing and stridor.   Cardiovascular: Negative.  Negative for chest pain, palpitations and leg swelling.  Gastrointestinal:  Negative.  Negative for abdominal distention, abdominal pain, nausea and vomiting.  Genitourinary: Negative.   Musculoskeletal:  Positive for back pain and gait problem. Negative for arthralgias and myalgias.       Posterior knee pain  Skin: Negative.  Negative for rash.  Allergic/Immunologic: Negative.  Negative for environmental allergies and food allergies.  Neurological:  Negative for dizziness, syncope, weakness and headaches.  Hematological: Negative.  Negative for adenopathy. Does not bruise/bleed easily.  Psychiatric/Behavioral: Negative.  Negative for agitation and sleep disturbance. The patient is not nervous/anxious.      Objective:    Physical Exam Vitals reviewed.  Constitutional:      Appearance: Normal appearance. He is well-developed. He is not diaphoretic.  HENT:     Head: Normocephalic and atraumatic.     Nose: No nasal deformity, septal deviation, mucosal edema or rhinorrhea.     Right Sinus: No maxillary sinus tenderness or frontal sinus tenderness.     Left Sinus: No maxillary sinus tenderness or frontal sinus tenderness.     Mouth/Throat:     Pharynx: No oropharyngeal exudate.  Eyes:     General: No scleral icterus.    Conjunctiva/sclera: Conjunctivae normal.     Pupils: Pupils are equal, round, and reactive to light.  Neck:     Thyroid: No thyromegaly.     Vascular: No carotid bruit or JVD.     Trachea: Trachea normal. No tracheal tenderness or tracheal deviation.  Cardiovascular:     Rate and Rhythm: Normal rate and regular rhythm.     Chest Wall: PMI is not displaced.     Pulses: Normal pulses. No decreased pulses.     Heart sounds: Normal heart sounds, S1 normal and S2 normal. Heart sounds not distant. No murmur heard. No systolic murmur is present.  No diastolic murmur is present.    No friction rub. No gallop. No S3 or S4 sounds.  Pulmonary:     Effort: No tachypnea, accessory muscle usage or respiratory distress.     Breath sounds: No stridor. No  decreased breath sounds, wheezing, rhonchi or rales.  Chest:     Chest wall: No tenderness.  Abdominal:     General: Bowel sounds are normal. There is no distension.     Palpations: Abdomen is soft. Abdomen is not rigid.     Tenderness: There is no abdominal tenderness. There is no guarding or rebound.  Musculoskeletal:        General: Tenderness and deformity present. Normal range of motion.     Cervical back: Normal range of motion and neck supple. No edema, erythema or rigidity. No muscular tenderness.  Normal range of motion.     Comments: Decrease ROM R knee Tender left lower back area scoliosis  Lymphadenopathy:     Head:     Right side of head: No submental or submandibular adenopathy.     Left side of head: No submental or submandibular adenopathy.     Cervical: No cervical adenopathy.  Skin:    General: Skin is warm and dry.     Coloration: Skin is not pale.     Findings: No rash.     Nails: There is no clubbing.  Neurological:     General: No focal deficit present.     Mental Status: He is alert and oriented to person, place, and time. Mental status is at baseline.     Sensory: No sensory deficit.  Psychiatric:        Mood and Affect: Mood normal.        Speech: Speech normal.        Behavior: Behavior normal.    There were no vitals taken for this visit. Wt Readings from Last 3 Encounters:  12/07/21 182 lb (82.6 kg)  07/28/21 178 lb (80.7 kg)  07/17/21 178 lb (80.7 kg)     Health Maintenance Due  Topic Date Due   Pneumonia Vaccine 10+ Years old (1 - PCV) Never done   Zoster Vaccines- Shingrix (1 of 2) Never done   COVID-19 Vaccine (4 - Booster for Pfizer series) 02/01/2021    There are no preventive care reminders to display for this patient.  Lab Results  Component Value Date   TSH 2.270 08/21/2017   Lab Results  Component Value Date   WBC 5.8 12/15/2020   HGB 13.2 12/15/2020   HCT 39.1 12/15/2020   MCV 90 12/15/2020   PLT 228 12/15/2020    Lab Results  Component Value Date   NA 142 12/15/2020   K 4.4 12/15/2020   CO2 23 12/15/2020   GLUCOSE 101 (H) 12/15/2020   BUN 23 12/15/2020   CREATININE 1.16 12/15/2020   BILITOT 0.9 12/22/2019   ALKPHOS 120 (H) 12/22/2019   AST 24 12/22/2019   ALT 15 12/22/2019   PROT 6.1 12/22/2019   ALBUMIN 3.8 12/22/2019   CALCIUM 8.7 12/15/2020   Lab Results  Component Value Date   CHOL 165 06/01/2020   Lab Results  Component Value Date   HDL 51 06/01/2020   Lab Results  Component Value Date   LDLCALC 97 06/01/2020   Lab Results  Component Value Date   TRIG 91 06/01/2020   Lab Results  Component Value Date   CHOLHDL 3.2 06/01/2020   No results found for: HGBA1C    Assessment & Plan:   Problem List Items Addressed This Visit   None  No orders of the defined types were placed in this encounter.   Follow-up: No follow-ups on file.    Shan Levans, MD

## 2022-01-02 ENCOUNTER — Ambulatory Visit: Payer: Medicare Other | Admitting: Critical Care Medicine

## 2022-01-02 ENCOUNTER — Other Ambulatory Visit: Payer: Self-pay

## 2022-01-02 NOTE — Progress Notes (Deleted)
?  ?Cardiology Office Note ? ? ?Date:  01/02/2022  ? ?ID:  Sean Ward, DOB 26-Oct-1940, MRN [id number] ? ?PCP:  Sean Frisk, MD  ? ? ?No chief complaint on file. ? ?AFib ? ?Wt Readings from Last 3 Encounters:  ?12/07/21 182 lb (82.6 kg)  ?07/28/21 178 lb (80.7 kg)  ?07/17/21 178 lb (80.7 kg)  ?  ? ?  ?History of Present Illness: ?Sean Ward is a 81 y.o. male  who was diagnosed with atrial fibrillation in January 2016. [facility]. He was asymptomatic when he initially was diagnosed with a heart rate of 115 220 bpm. His chads score was only 1 and therefore he was not started on anticoagulation. He was started on full dose aspirin. He was treated with Lasix for edema, but this did not seem to help the edema which was thought to be due to venous stasis and varicose veins.  Compression stockings have helped. ?  ?He has Parkinsons sx.  He is benefitting from the medication. ?  ?In the past, aspirin was stopped and Eliquis was started.  ?  ?He has had mild left leg edema chronically. ?  ?He had a rough 2020.  He lost transportation, could not sleep and became depressed.  He had some edema-  Lasix was prescribed.  He had some improvement.  ?  ?Knee swelling on the right.  Exercise is limited due to knee problems.  ?  ? ? ? ?Past Medical History:  ?Diagnosis Date  ? Anticoagulant long-term use   ? Back pain with radiation   ? Hernia, inguinal, left 05/03/2014  ? 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 History of bronchitis   ? Inguinal hernia, right 04/08/2014  ? Parkinson's disease (HCC)   ? 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HCC)   ? Scoliosis of cervicothoracic spine   ? Scoliosis of thoracolumbar spine   ? Spondylosis of lumbar spine   ? ? ?Past Surgical History:  ?Procedure Laterality Date  ? HERNIA REPAIR    ? TONSILLECTOMY    ? ? ? ?Current Outpatient Medications  ?Medication Sig Dispense Refill  ? apixaban (ELIQUIS) 5 MG TABS tablet  TAKE 1 TABLET (5 MG TOTAL) BY MOUTH 2 (TWO) TIMES DAILY. 180 tablet 1  ? aspirin 325 MG tablet 1 tablet    ? carbidopa-levodopa (SINEMET CR) 50-200 MG tablet TAKE 1 TABLET BY MOUTH AT BEDTIME. 90 tablet 3  ? carbidopa-levodopa (SINEMET IR) 25-100 MG tablet TAKE 1 & 1/2 TABLETS BY  MOUTH 5 TIMES A DAY, AT 8 AM, 11 AM, 2 PM, 5 PM AND 8 PM. 680 tablet 3  ? latanoprost (XALATAN) 0.005 % ophthalmic solution APPLY 1 DROP TO EYE NIGHTLY. 12.5 mL 3  ? latanoprost (XALATAN) 0.005 % ophthalmic solution Place 1 drop into both eyes nightly. 2.5 mL 3  ? latanoprost (XALATAN) 0.005 % ophthalmic solution Place 1 drop into both eyes nightly. 7.5 mL 1  ? Multiple Vitamin (MULTIVITAMIN) tablet Take 1 tablet by mouth daily.    ? propranolol ER (INDERAL LA) 60 MG 24 hr capsule TAKE 1 CAPSULE (60 MG TOTAL) BY MOUTH DAILY. 90 capsule 3  ? ?No current facility-administered medications for this visit.  ? ? ?Allergies:   Patient has no known allergies.  ? ? ?Social History:  The patient  reports that he has never smoked. He has never used smokeless tobacco. He reports current alcohol use of about 3.0  standard drinks per week. He reports that he does not use drugs.  ? ?Family History:  The patient's ***family history includes Deafness in his mother and sister.  ? ? ?ROS:  Please see the history of present illness.   Otherwise, review of systems are positive for ***.   All other systems are reviewed and negative.  ? ? ?PHYSICAL EXAM: ?VS:  There were no vitals taken for this visit. , BMI There is no height or weight on file to calculate BMI. ?GEN: Well nourished, well developed, in no acute distress ?HEENT: normal ?Neck: no JVD, carotid bruits, or masses ?Cardiac: ***RRR; no murmurs, rubs, or gallops,no edema  ?Respiratory:  clear to auscultation bilaterally, normal work of breathing ?GI: soft, nontender, nondistended, + BS ?MS: no deformity or atrophy ?Skin: warm and dry, no rash ?Neuro:  Strength and sensation are intact ?Psych: euthymic  mood, full affect ? ? ?EKG:   ?The ekg ordered today demonstrates *** ? ? ?Recent Labs: ?No results found for requested labs within last 8760 hours.  ? ?Lipid Panel ?   ?Component Value Date/Time  ? CHOL 165 06/01/2020 1529  ? TRIG 91 06/01/2020 1529  ? HDL 51 06/01/2020 1529  ? CHOLHDL 3.2 06/01/2020 1529  ? CHOLHDL 3.5 08/16/2016 1038  ? VLDL 24 08/16/2016 1038  ? LDLCALC 97 06/01/2020 1529  ? ?  ?Other studies Reviewed: ?Additional studies/ records that were reviewed today with results demonstrating: ***. ? ? ?ASSESSMENT AND PLAN: ? 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  ? ? ?Current medicines are reviewed at length with the patient today.  The patient concerns regarding his medicines were addressed. ? ?The following changes have been made:  No change*** ? ?Labs/ tests ordered today include: *** ?No orders of the defined types were placed in this encounter. ? ? ?Recommend 150 minutes/week of aerobic exercise ?Low fat, low carb, high fiber diet recommended ? ?Disposition:   FU in *** ? ? ?Signed, ?Lance Muss, MD  ?01/02/2022 11:40 PM    ?[facility] ?[address] [place], [place], [place]  [address] ?Phone: [phone]; Fax: [phone]  ? ?

## 2022-01-04 ENCOUNTER — Ambulatory Visit: Payer: Medicare Other | Admitting: Interventional Cardiology

## 2022-01-04 DIAGNOSIS — D6869 Other thrombophilia: Secondary | ICD-10-CM

## 2022-01-04 DIAGNOSIS — R600 Localized edema: Secondary | ICD-10-CM

## 2022-01-04 DIAGNOSIS — Z9181 History of falling: Secondary | ICD-10-CM

## 2022-01-04 DIAGNOSIS — I4819 Other persistent atrial fibrillation: Secondary | ICD-10-CM

## 2022-01-09 ENCOUNTER — Other Ambulatory Visit: Payer: Self-pay

## 2022-01-10 ENCOUNTER — Other Ambulatory Visit: Payer: Self-pay

## 2022-01-19 ENCOUNTER — Other Ambulatory Visit: Payer: Self-pay

## 2022-01-30 ENCOUNTER — Other Ambulatory Visit: Payer: Self-pay

## 2022-02-01 ENCOUNTER — Other Ambulatory Visit: Payer: Self-pay

## 2022-02-08 ENCOUNTER — Other Ambulatory Visit: Payer: Self-pay

## 2022-02-09 ENCOUNTER — Other Ambulatory Visit: Payer: Self-pay

## 2022-02-12 ENCOUNTER — Other Ambulatory Visit: Payer: Self-pay

## 2022-02-12 ENCOUNTER — Ambulatory Visit: Payer: Medicare Other | Admitting: Neurology

## 2022-02-13 ENCOUNTER — Ambulatory Visit: Payer: Medicare Other | Admitting: Orthopedic Surgery

## 2022-02-14 ENCOUNTER — Encounter: Payer: Self-pay | Admitting: Interventional Cardiology

## 2022-02-14 ENCOUNTER — Ambulatory Visit: Payer: Medicare Other | Admitting: Interventional Cardiology

## 2022-02-14 ENCOUNTER — Other Ambulatory Visit: Payer: Self-pay

## 2022-02-14 VITALS — BP 116/80 | HR 71 | Ht 69.0 in | Wt 179.0 lb

## 2022-02-14 DIAGNOSIS — R600 Localized edema: Secondary | ICD-10-CM

## 2022-02-14 DIAGNOSIS — Z9181 History of falling: Secondary | ICD-10-CM

## 2022-02-14 DIAGNOSIS — I4819 Other persistent atrial fibrillation: Secondary | ICD-10-CM | POA: Diagnosis not present

## 2022-02-14 DIAGNOSIS — R7309 Other abnormal glucose: Secondary | ICD-10-CM | POA: Diagnosis not present

## 2022-02-14 NOTE — Progress Notes (Signed)
?  ?Cardiology Office Note ? ? ?Date:  02/14/2022  ? ?ID:  Sean Ward, DOB 1940/11/23, MRN [id number] ? ?PCP:  [name], MD  ? ? ?Chief Complaint  ?Patient presents with  ? Follow-up  ? ?AFib ? ?Wt Readings from Last 3 Encounters:  ?02/14/22 179 lb (81.2 kg)  ?12/07/21 182 lb (82.6 kg)  ?07/28/21 178 lb (80.7 kg)  ?  ? ?  ?History of Present Illness: ?Sean Ward is a 81 y.o. male  who was diagnosed with atrial fibrillation in January 2016. He [facility]. He was asymptomatic when he initially was diagnosed with a heart rate of 115 220 bpm. His chads score was only 1 and therefore he was not started on anticoagulation. He was started on full dose aspirin. He was treated with Lasix for edema, but this did not seem to help the edema which was thought to be due to venous stasis and varicose veins.  Compression stockings have helped. ?  ?He has Parkinsons sx.  He is benefitting from the medication. ?  ?In the past, aspirin was stopped and Eliquis was started.  ?  ?He has had mild left leg edema chronically. ?  ?He had a rough 2020.  He lost transportation, could not sleep and became depressed.  He had some edema-  Lasix was prescribed.  He had some improvement.  ? ?Knee problems have limited his walking.  No cardiac symptoms when walking up stairs. ? ?Denies : Chest pain. Dizziness.  Nitroglycerin use. Orthopnea. Palpitations. Paroxysmal nocturnal dyspnea. Shortness of breath. Syncope.   ? ? ? ?Past Medical History:  ?Diagnosis Date  ? Anticoagulant long-term use   ? Back pain with radiation   ? Hernia, inguinal, left 05/03/2014  ? 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 History of bronchitis   ? Inguinal hernia, right 04/08/2014  ? Parkinson's disease (HCC)   ? 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HCC)   ? Scoliosis of cervicothoracic spine   ? Scoliosis of thoracolumbar spine   ? Spondylosis of lumbar spine   ? ? ?Past Surgical History:   ?Procedure Laterality Date  ? HERNIA REPAIR    ? TONSILLECTOMY    ? ? ? ?Current Outpatient Medications  ?Medication Sig Dispense Refill  ? apixaban (ELIQUIS) 5 MG TABS tablet TAKE 1 TABLET (5 MG TOTAL) BY MOUTH 2 (TWO) TIMES DAILY. 180 tablet 1  ? aspirin 325 MG tablet Take 325 mg by mouth as needed for moderate pain.    ? carbidopa-levodopa (SINEMET CR) 50-200 MG tablet TAKE 1 TABLET BY MOUTH AT BEDTIME. 90 tablet 3  ? carbidopa-levodopa (SINEMET IR) 25-100 MG tablet TAKE 1 & 1/2 TABLETS BY  MOUTH 5 TIMES A DAY, AT 8 AM, 11 AM, 2 PM, 5 PM AND 8 PM. 680 tablet 3  ? latanoprost (XALATAN) 0.005 % ophthalmic solution APPLY 1 DROP TO EYE NIGHTLY. 12.5 mL 3  ? latanoprost (XALATAN) 0.005 % ophthalmic solution Place 1 drop into both eyes nightly. 2.5 mL 3  ? latanoprost (XALATAN) 0.005 % ophthalmic solution Place 1 drop into both eyes nightly. 7.5 mL 1  ? Multiple Vitamin (MULTIVITAMIN) tablet Take 1 tablet by mouth daily.    ? propranolol ER (INDERAL LA) 60 MG 24 hr capsule TAKE 1 CAPSULE (60 MG TOTAL) BY MOUTH DAILY. 90 capsule 3  ? ?No current facility-administered medications for this visit.  ? ? ?Allergies:   Patient has no known  allergies.  ? ? ?Social History:  The patient  reports that he has never smoked. He has never used smokeless tobacco. He reports current alcohol use of about 3.0 standard drinks per week. He reports that he does not use drugs.  ? ?Family History:  The patient's family history includes Deafness in his mother and sister.  ? ? ?ROS:  Please see the history of present illness.   Otherwise, review of systems are positive for knee pain.   All other systems are reviewed and negative.  ? ? ?PHYSICAL EXAM: ?VS:  BP 116/80   Pulse 71   Ht 5\' 9"  (1.753 m)   Wt 179 lb (81.2 kg)   SpO2 98%   BMI 26.43 kg/m?  , BMI Body mass index is 26.43 kg/m?. ?GEN: Well nourished, well developed, in no acute distress ?HEENT: normal ?Neck: no JVD, carotid bruits, or masses ?Cardiac: irregularly irregular; no  murmurs, rubs, or gallops,; bilateral ankle edema  ?Respiratory:  clear to auscultation bilaterally, normal work of breathing ?GI: soft, nontender, nondistended, + BS ?MS: no deformity or atrophy ?Skin: warm and dry, no rash ?Neuro:  Strength and sensation are intact ?Psych: euthymic mood, full affect ? ? ?EKG:   ?The ekg ordered today demonstrates AFib, rate controlled. ? ? ?Recent Labs: ?No results found for requested labs within last 8760 hours.  ? ?Lipid Panel ?   ?Component Value Date/Time  ? CHOL 165 06/01/2020 1529  ? TRIG 91 06/01/2020 1529  ? HDL 51 06/01/2020 1529  ? CHOLHDL 3.2 06/01/2020 1529  ? CHOLHDL 3.5 08/16/2016 1038  ? VLDL 24 08/16/2016 1038  ? LDLCALC 97 06/01/2020 1529  ? ?  ?Other studies Reviewed: ?Additional studies/ records that were reviewed today with results demonstrating: LDL 97, HDL 51TG 91 in 2022.  Normal LV function in 2018. ? ? ?ASSESSMENT AND PLAN: ? ?PAF: AFib, rate controlled. No palpitations. Eliquis for stroke prevention.  Acquired thrombophilia.   ?LE edema: compression stockings.  Trying to avoid Lasix use.  ?Fall risk: Parkinsons.  Knee problems.  May get TKR.  Would try acetaminophen for pain relief to reduce bleeding risk.  Currently taking prn aspirin.  ?Elevated blood glucose.  Check A1c and Eliquis labs.  Check lipids as well when fasting. ? ? ? ?Current medicines are reviewed at length with the patient today.  The patient concerns regarding his medicines were addressed. ? ?The following changes have been made:  No change ? ?Labs/ tests ordered today include:  ?No orders of the defined types were placed in this encounter. ? ? ?Recommend 150 minutes/week of aerobic exercise ?Low fat, low carb, high fiber diet recommended ? ?Disposition:   FU in 1 year ? ? ?Signed, ?2019, MD  ?02/14/2022 12:22 PM    ?[facility] ?[address] [place], [place], [place]  [place] ?Phone: [phone]; Fax: [phone]  ? ?

## 2022-02-14 NOTE — Patient Instructions (Signed)
Medication Instructions:  ?Your physician recommends that you continue on your current medications as directed. Please refer to the Current Medication list given to you today. ?Please do not take aspirin for pain.  Can take acetaminophen.  Follow label instructions.  ?*If you need a refill on your cardiac medications before your next appointment, please call your pharmacy* ? ? ?Lab Work: ?Lab work to be done today--CBC, CMET, TSH, A1C ?If you have labs (blood work) drawn today and your tests are completely normal, you will receive your results only by: ?MyChart Message (if you have MyChart) OR ?A paper copy in the mail ?If you have any lab test that is abnormal or we need to change your treatment, we will call you to review the results. ? ? ?Testing/Procedures: ?none ? ? ?Follow-Up: ?At [facility], you and your health needs are our priority.  As part of our continuing mission to provide you with exceptional heart care, we have created designated Provider Care Teams.  These Care Teams include your primary Cardiologist (physician) and Advanced Practice Providers (APPs -  Physician Assistants and Nurse Practitioners) who all work together to provide you with the care you need, when you need it. ? ?We recommend signing up for the patient portal called "MyChart".  Sign up information is provided on this After Visit Summary.  MyChart is used to connect with patients for Virtual Visits (Telemedicine).  Patients are able to view lab/test results, encounter notes, upcoming appointments, etc.  Non-urgent messages can be sent to your provider as well.   ?To learn more about what you can do with MyChart, go to [email or web address].   ? ?Your next appointment:   ?12 month(s) ? ?The format for your next appointment:   ?In Person ? ?Provider:   ?Lance Muss, MD   ? ? ?Other Instructions ?  ? ?Important Information About Sugar ? ? ? ? ? ? ?

## 2022-02-15 LAB — COMPREHENSIVE METABOLIC PANEL
ALT: 14 IU/L (ref 0–44)
AST: 20 IU/L (ref 0–40)
Albumin/Globulin Ratio: 1.9 (ref 1.2–2.2)
Albumin: 3.8 g/dL (ref 3.7–4.7)
Alkaline Phosphatase: 146 IU/L — ABNORMAL HIGH (ref 44–121)
BUN/Creatinine Ratio: 20 (ref 10–24)
BUN: 22 mg/dL (ref 8–27)
Bilirubin Total: 0.8 mg/dL (ref 0.0–1.2)
CO2: 22 mmol/L (ref 20–29)
Calcium: 8.9 mg/dL (ref 8.6–10.2)
Chloride: 106 mmol/L (ref 96–106)
Creatinine, Ser: 1.1 mg/dL (ref 0.76–1.27)
Globulin, Total: 2 g/dL (ref 1.5–4.5)
Glucose: 93 mg/dL (ref 70–99)
Potassium: 4.6 mmol/L (ref 3.5–5.2)
Sodium: 142 mmol/L (ref 134–144)
Total Protein: 5.8 g/dL — ABNORMAL LOW (ref 6.0–8.5)
eGFR: 68 mL/min/{1.73_m2} (ref >59)

## 2022-02-15 LAB — CBC
Hematocrit: 40.4 % (ref 37.5–51.0)
Hemoglobin: 13.4 g/dL (ref 13.0–17.7)
MCH: 29.8 pg (ref 26.6–33.0)
MCHC: 33.2 g/dL (ref 31.5–35.7)
MCV: 90 fL (ref 79–97)
Platelets: 260 10*3/uL (ref 150–450)
RBC: 4.5 x10E6/uL (ref 4.14–5.80)
RDW: 14.3 % (ref 11.6–15.4)
WBC: 6.9 10*3/uL (ref 3.4–10.8)

## 2022-02-15 LAB — TSH: TSH: 3.28 u[IU]/mL (ref 0.450–4.500)

## 2022-02-15 LAB — HEMOGLOBIN A1C
Est. average glucose Bld gHb Est-mCnc: 126 mg/dL
Hgb A1c MFr Bld: 6 % — ABNORMAL HIGH (ref 4.8–5.6)

## 2022-02-23 ENCOUNTER — Other Ambulatory Visit: Payer: Self-pay

## 2022-02-26 ENCOUNTER — Ambulatory Visit: Payer: Medicare Other | Admitting: Orthopedic Surgery

## 2022-02-26 ENCOUNTER — Other Ambulatory Visit: Payer: Self-pay

## 2022-02-28 ENCOUNTER — Other Ambulatory Visit: Payer: Self-pay

## 2022-03-02 ENCOUNTER — Other Ambulatory Visit: Payer: Self-pay

## 2022-03-05 ENCOUNTER — Other Ambulatory Visit: Payer: Self-pay

## 2022-03-05 NOTE — Telephone Encounter (Signed)
error 

## 2022-03-06 ENCOUNTER — Ambulatory Visit: Payer: Medicare Other | Admitting: Orthopedic Surgery

## 2022-03-08 ENCOUNTER — Ambulatory Visit: Payer: Medicare Other | Admitting: Orthopedic Surgery

## 2022-03-08 ENCOUNTER — Encounter: Payer: Self-pay | Admitting: Orthopedic Surgery

## 2022-03-08 ENCOUNTER — Ambulatory Visit (INDEPENDENT_AMBULATORY_CARE_PROVIDER_SITE_OTHER): Payer: Medicare Other

## 2022-03-08 DIAGNOSIS — G8929 Other chronic pain: Secondary | ICD-10-CM

## 2022-03-08 DIAGNOSIS — M25561 Pain in right knee: Secondary | ICD-10-CM

## 2022-03-08 DIAGNOSIS — M545 Low back pain, unspecified: Secondary | ICD-10-CM

## 2022-03-08 MED ORDER — METHYLPREDNISOLONE ACETATE 40 MG/ML IJ SUSP
40.0000 mg | INTRAMUSCULAR | Status: AC | PRN
  Administered 2022-03-08: 40 mg via INTRA_ARTICULAR

## 2022-03-08 MED ORDER — LIDOCAINE HCL (PF) 1 % IJ SOLN
5.0000 mL | INTRAMUSCULAR | Status: AC | PRN
  Administered 2022-03-08: 5 mL

## 2022-03-08 NOTE — Progress Notes (Signed)
Office Visit Note   Patient: Sean Ward           Date of Birth: November 27, 1940           MRN: [id number] Visit Date: 03/08/2022              Requested by: [name], MD [address] [place],  [place] [address] PCP: [name], MD  Chief Complaint  Patient presents with   Right Knee - Pain      HPI: The patient is a 81 year old gentleman who presents with 2 separate issues.  #1 lower back pain with left lower extremity radicular symptoms.  Patient has seen Dr. Ophelia Charter for this a MRI scheduled was not obtained [place]  Patient complains of increasing pain and swelling in his right knee.  Assessment & Plan: Visit Diagnoses:  1. Chronic pain of right knee   2. Chronic bilateral low back pain, unspecified whether sciatica present     Plan: Right knee was injected he tolerated this well discussed that we could repeat injection in 3 months if he is still symptomatic.  Patient will follow-up with Dr. Ophelia Charter after the MRI scan.  Follow-Up Instructions: Return if symptoms worsen or fail to improve.   Ortho Exam  Patient is alert, oriented, no adenopathy, well-dressed, normal affect, normal respiratory effort. Examination patient has decreased range of motion of the right knee he has an antalgic gait there is swelling crepitation with range of motion collaterals and cruciates are stable there is tenderness to palpation medial and lateral joint line as well as the patellofemoral joint.  Imaging: XR Knee 1-2 Views Right  Result Date: 03/08/2022 2 view radiographs of the right knee shows advanced arthritic changes worse in the right than the left knee.  There is joint space narrowing subchondral or sclerosis and cysts and osteophytic bone spurs in all 3 compartments.  No images are attached to the encounter.  Labs: Lab Results  Component Value Date   HGBA1C 6.0 (H) 02/14/2022   ESRSEDRATE 10 12/31/2019   LABURIC 4.6  12/31/2019     Lab Results  Component Value Date   ALBUMIN 3.8 02/14/2022   ALBUMIN 3.8 12/22/2019   ALBUMIN 3.7 08/21/2017    Lab Results  Component Value Date   MG 2.0 10/27/2014   No results found for: VD25OH  No results found for: PREALBUMIN    Latest Ref Rng & Units 02/14/2022   12:31 PM 12/15/2020   12:53 PM 12/22/2019    4:02 PM  CBC EXTENDED  WBC 3.4 - 10.8 x10E3/uL 6.9   5.8   6.8    RBC 4.14 - 5.80 x10E6/uL 4.50   4.36   4.45    Hemoglobin 13.0 - 17.7 g/dL 00.9   38.1   82.9    HCT 37.5 - 51.0 % 40.4   39.1   40.8    Platelets 150 - 450 x10E3/uL 260   228   243       There is no height or weight on file to calculate BMI.  Orders:  Orders Placed This Encounter  Procedures   XR Knee 1-2 Views Right   No orders of the defined types were placed in this encounter.    Procedures: Large Joint Inj: R knee on 03/08/2022 12:35 PM Indications: pain and diagnostic evaluation Details: 22 G 1.5 in needle, anteromedial approach  Arthrogram: No  Medications: 5 mL lidocaine (PF)  1 %; 40 mg methylPREDNISolone acetate 40 MG/ML Outcome: tolerated well, no immediate complications Procedure, treatment alternatives, risks and benefits explained, specific risks discussed. Consent was given by the patient. Immediately prior to procedure a time out was called to verify the correct patient, procedure, equipment, support staff and site/side marked as required. Patient was prepped and draped in the usual sterile fashion.     Clinical Data: No additional findings.  ROS:  All other systems negative, except as noted in the HPI. Review of Systems  Objective: Vital Signs: There were no vitals taken for this visit.  Specialty Comments:  No specialty comments available.  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   Lumbar back pain 05/10/2021   Parkinson disease (HCC) 02/28/2018   Primary osteoarthritis of right knee 08/16/2016   Dyslipidemia 08/16/2016   Unsteady gait 05/24/2016   Atrial fibrillation (HCC) 10/27/2014   Onychomycosis 05/03/2014   Tremor 04/08/2014   Venous stasis 03/25/2014   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   History of bronchitis    Inguinal hernia, right 04/08/2014   Parkinson's disease (HCC)    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HCC)    Scoliosis of cervicothoracic spine    Scoliosis of thoracolumbar spine    Spondylosis of lumbar spine     Family History  Problem Relation Age of Onset   Deafness Mother    Deafness Sister    Heart disease Neg Hx     Past Surgical History:  Procedure Laterality Date   HERNIA REPAIR     TONSILLECTOMY     Social History   Occupational History   Not on file  Tobacco Use   Smoking status: Never   Smokeless tobacco: Never  Vaping Use   Vaping Use: Never used  Substance and Sexual Activity   Alcohol use: Yes    Alcohol/week: 3.0 standard drinks    Types: 3 Glasses of wine per week    Comment: 1 glass of red wine with dinner some nights   Drug use: No   Sexual activity: Not Currently

## 2022-03-13 ENCOUNTER — Telehealth: Payer: Self-pay | Admitting: Orthopedic Surgery

## 2022-03-13 NOTE — Telephone Encounter (Signed)
Patient called advised he did not get any paperwork when he left last week. Patient asked what were the diagnosis was. Patient asked does he need to return to see the doctor again? Patient asked if he can use a bandage on his right knee? The number to contact patient is [phone]

## 2022-03-14 NOTE — Telephone Encounter (Signed)
Called pt and lm on vm to advise that he has a dx of chronic knee pain and that he was provided a cortisone injection for this. He can follow up with Dr. Sharol Given as needed but he should follow up with Dr. Lorin Mercy after he has had his MRI of his back. To call with any questions.

## 2022-03-16 ENCOUNTER — Other Ambulatory Visit: Payer: Self-pay

## 2022-03-21 ENCOUNTER — Telehealth: Payer: Self-pay | Admitting: Orthopedic Surgery

## 2022-03-21 NOTE — Telephone Encounter (Signed)
Pt called requesting a call back. Pt states he had a lot of medical questions medications and bandages he need answers too. Pt states he can hear his right knee bones rubbing together. Please call pt at [phone].

## 2022-03-22 NOTE — Telephone Encounter (Signed)
I called and sw pt and he states that he is having horrible knee pain he is s/p a cortisone injection on 03/08/2022 worked for a few days but states the pain has come back and he is not able to wtb at all pt states a few times that the knee has given way and has almost fallen because of this. He wants to know what other options that he has or if he can have another injection? Pt has been taking tylenol for pain any other suggestions.

## 2022-03-23 ENCOUNTER — Telehealth: Payer: Self-pay | Admitting: Orthopedic Surgery

## 2022-03-23 NOTE — Telephone Encounter (Signed)
Pt called requesting a call from Autumn F. Explained to pt what note from Dr. Sharol Given recommended and he is asking what can he do about his pain in the meantime. Please call pt before the days end he asked. Pt phone number is [phone].

## 2022-03-23 NOTE — Telephone Encounter (Signed)
I spoke with pt advised that he can do whatever OTC pain medication he has been using. Pt has an appt on 04/02/22 to discuss ottal knee replacement surgery and will all so have prehab assessment to see if he would be able to comply with post op rehab

## 2022-03-23 NOTE — Telephone Encounter (Signed)
Pt has been made aware. 

## 2022-03-30 ENCOUNTER — Encounter (HOSPITAL_COMMUNITY): Payer: Self-pay

## 2022-03-30 ENCOUNTER — Other Ambulatory Visit: Payer: Self-pay

## 2022-03-30 ENCOUNTER — Emergency Department (HOSPITAL_COMMUNITY): Payer: Medicare Other

## 2022-03-30 ENCOUNTER — Inpatient Hospital Stay (HOSPITAL_COMMUNITY)
Admission: EM | Admit: 2022-03-30 | Discharge: 2022-04-03 | DRG: 565 | Disposition: A | Discharge status: Skilled Nursing Facility | Payer: Medicare Other | Attending: Internal Medicine | Admitting: Internal Medicine

## 2022-03-30 DIAGNOSIS — R531 Weakness: Secondary | ICD-10-CM

## 2022-03-30 DIAGNOSIS — D72829 Elevated white blood cell count, unspecified: Secondary | ICD-10-CM | POA: Diagnosis present

## 2022-03-30 DIAGNOSIS — Y92039 Unspecified place in apartment as the place of occurrence of the external cause: Secondary | ICD-10-CM

## 2022-03-30 DIAGNOSIS — Z7901 Long term (current) use of anticoagulants: Secondary | ICD-10-CM

## 2022-03-30 DIAGNOSIS — Z20822 Contact with and (suspected) exposure to covid-19: Secondary | ICD-10-CM | POA: Diagnosis present

## 2022-03-30 DIAGNOSIS — I4819 Other persistent atrial fibrillation: Secondary | ICD-10-CM | POA: Diagnosis not present

## 2022-03-30 DIAGNOSIS — M25461 Effusion, right knee: Secondary | ICD-10-CM | POA: Diagnosis present

## 2022-03-30 DIAGNOSIS — G20A1 Parkinson's disease without dyskinesia, without mention of fluctuations: Secondary | ICD-10-CM | POA: Diagnosis present

## 2022-03-30 DIAGNOSIS — K4090 Unilateral inguinal hernia, without obstruction or gangrene, not specified as recurrent: Secondary | ICD-10-CM | POA: Diagnosis present

## 2022-03-30 DIAGNOSIS — S7001XA Contusion of right hip, initial encounter: Secondary | ICD-10-CM | POA: Diagnosis present

## 2022-03-30 DIAGNOSIS — E870 Hyperosmolality and hypernatremia: Secondary | ICD-10-CM | POA: Diagnosis present

## 2022-03-30 DIAGNOSIS — E860 Dehydration: Secondary | ICD-10-CM | POA: Diagnosis present

## 2022-03-30 DIAGNOSIS — L89892 Pressure ulcer of other site, stage 2: Secondary | ICD-10-CM | POA: Diagnosis present

## 2022-03-30 DIAGNOSIS — L8990 Pressure ulcer of unspecified site, unspecified stage: Secondary | ICD-10-CM | POA: Diagnosis present

## 2022-03-30 DIAGNOSIS — W19XXXA Unspecified fall, initial encounter: Principal | ICD-10-CM

## 2022-03-30 DIAGNOSIS — L89101 Pressure ulcer of unspecified part of back, stage 1: Secondary | ICD-10-CM | POA: Diagnosis present

## 2022-03-30 DIAGNOSIS — T796XXA Traumatic ischemia of muscle, initial encounter: Secondary | ICD-10-CM | POA: Diagnosis not present

## 2022-03-30 DIAGNOSIS — W1830XA Fall on same level, unspecified, initial encounter: Secondary | ICD-10-CM | POA: Diagnosis present

## 2022-03-30 DIAGNOSIS — L89212 Pressure ulcer of right hip, stage 2: Secondary | ICD-10-CM | POA: Diagnosis present

## 2022-03-30 DIAGNOSIS — G20 Parkinson's disease: Secondary | ICD-10-CM | POA: Diagnosis present

## 2022-03-30 DIAGNOSIS — E876 Hypokalemia: Secondary | ICD-10-CM | POA: Diagnosis not present

## 2022-03-30 DIAGNOSIS — Z79899 Other long term (current) drug therapy: Secondary | ICD-10-CM

## 2022-03-30 DIAGNOSIS — I480 Paroxysmal atrial fibrillation: Secondary | ICD-10-CM | POA: Diagnosis present

## 2022-03-30 DIAGNOSIS — M419 Scoliosis, unspecified: Secondary | ICD-10-CM | POA: Diagnosis present

## 2022-03-30 DIAGNOSIS — M6282 Rhabdomyolysis: Secondary | ICD-10-CM | POA: Diagnosis not present

## 2022-03-30 DIAGNOSIS — R4182 Altered mental status, unspecified: Secondary | ICD-10-CM | POA: Diagnosis present

## 2022-03-30 DIAGNOSIS — M1711 Unilateral primary osteoarthritis, right knee: Secondary | ICD-10-CM | POA: Diagnosis present

## 2022-03-30 DIAGNOSIS — I4891 Unspecified atrial fibrillation: Secondary | ICD-10-CM | POA: Diagnosis present

## 2022-03-30 LAB — URINALYSIS, ROUTINE W REFLEX MICROSCOPIC
Bilirubin Urine: NEGATIVE
Glucose, UA: NEGATIVE mg/dL
Ketones, ur: 20 mg/dL — AB
Leukocytes,Ua: NEGATIVE
Nitrite: NEGATIVE
Protein, ur: 30 mg/dL — AB
Specific Gravity, Urine: >1.046 — ABNORMAL HIGH (ref 1.005–1.030)
pH: 6 (ref 5.0–8.0)

## 2022-03-30 LAB — PROTIME-INR
INR: 1.4 — ABNORMAL HIGH (ref 0.8–1.2)
Prothrombin Time: 16.6 seconds — ABNORMAL HIGH (ref 11.4–15.2)

## 2022-03-30 LAB — CBC
HCT: 48 % (ref 39.0–52.0)
HCT: 42.6 % (ref 39.0–52.0)
Hemoglobin: 16.6 g/dL (ref 13.0–17.0)
Hemoglobin: 14.6 g/dL (ref 13.0–17.0)
MCH: 31.1 pg (ref 26.0–34.0)
MCH: 30.9 pg (ref 26.0–34.0)
MCHC: 34.6 g/dL (ref 30.0–36.0)
MCHC: 34.3 g/dL (ref 30.0–36.0)
MCV: 90.1 fL (ref 80.0–100.0)
MCV: 90.3 fL (ref 80.0–100.0)
Platelets: 251 10*3/uL (ref 150–400)
Platelets: 227 10*3/uL (ref 150–400)
RBC: 5.33 MIL/uL (ref 4.22–5.81)
RBC: 4.72 MIL/uL (ref 4.22–5.81)
RDW: 14.9 % (ref 11.5–15.5)
RDW: 15.2 % (ref 11.5–15.5)
WBC: 12.2 10*3/uL — ABNORMAL HIGH (ref 4.0–10.5)
WBC: 10.5 10*3/uL (ref 4.0–10.5)
nRBC: 0 % (ref 0.0–0.2)
nRBC: 0 % (ref 0.0–0.2)

## 2022-03-30 LAB — COMPREHENSIVE METABOLIC PANEL
ALT: 68 U/L — ABNORMAL HIGH (ref 0–44)
AST: 95 U/L — ABNORMAL HIGH (ref 15–41)
Albumin: 2.8 g/dL — ABNORMAL LOW (ref 3.5–5.0)
Alkaline Phosphatase: 111 U/L (ref 38–126)
Anion gap: 13 (ref 5–15)
BUN: 50 mg/dL — ABNORMAL HIGH (ref 8–23)
CO2: 21 mmol/L — ABNORMAL LOW (ref 22–32)
Calcium: 8.8 mg/dL — ABNORMAL LOW (ref 8.9–10.3)
Chloride: 113 mmol/L — ABNORMAL HIGH (ref 98–111)
Creatinine, Ser: 1.17 mg/dL (ref 0.61–1.24)
GFR, Estimated: >60 mL/min (ref >60)
Glucose, Bld: 125 mg/dL — ABNORMAL HIGH (ref 70–99)
Potassium: 3.6 mmol/L (ref 3.5–5.1)
Sodium: 147 mmol/L — ABNORMAL HIGH (ref 135–145)
Total Bilirubin: 2.9 mg/dL — ABNORMAL HIGH (ref 0.3–1.2)
Total Protein: 6 g/dL — ABNORMAL LOW (ref 6.5–8.1)

## 2022-03-30 LAB — ETHANOL: Alcohol, Ethyl (B): <10 mg/dL (ref <10)

## 2022-03-30 LAB — CK: Total CK: 2776 U/L — ABNORMAL HIGH (ref 49–397)

## 2022-03-30 LAB — I-STAT CHEM 8, ED
BUN: 46 mg/dL — ABNORMAL HIGH (ref 8–23)
Calcium, Ion: 1.17 mmol/L (ref 1.15–1.40)
Chloride: 114 mmol/L — ABNORMAL HIGH (ref 98–111)
Creatinine, Ser: 1 mg/dL (ref 0.61–1.24)
Glucose, Bld: 119 mg/dL — ABNORMAL HIGH (ref 70–99)
HCT: 48 % (ref 39.0–52.0)
Hemoglobin: 16.3 g/dL (ref 13.0–17.0)
Potassium: 3.5 mmol/L (ref 3.5–5.1)
Sodium: 148 mmol/L — ABNORMAL HIGH (ref 135–145)
TCO2: 23 mmol/L (ref 22–32)

## 2022-03-30 LAB — RESP PANEL BY RT-PCR (FLU A&B, COVID) ARPGX2
Influenza A by PCR: NEGATIVE
Influenza B by PCR: NEGATIVE
SARS Coronavirus 2 by RT PCR: NEGATIVE

## 2022-03-30 LAB — AMMONIA: Ammonia: <10 umol/L (ref 9–35)

## 2022-03-30 LAB — POC OCCULT BLOOD, ED: Fecal Occult Bld: NEGATIVE

## 2022-03-30 LAB — ABO/RH: ABO/RH(D): O POS

## 2022-03-30 LAB — LACTIC ACID, PLASMA: Lactic Acid, Venous: 2.2 mmol/L (ref 0.5–1.9)

## 2022-03-30 MED ORDER — IOHEXOL 300 MG/ML  SOLN
100.0000 mL | Freq: Once | INTRAMUSCULAR | Status: AC | PRN
  Administered 2022-03-30: 100 mL via INTRAVENOUS

## 2022-03-30 MED ORDER — ACETAMINOPHEN 650 MG RE SUPP: 650.0000 mg | Freq: Four times a day (QID) | RECTAL | Status: DC | PRN

## 2022-03-30 MED ORDER — SODIUM CHLORIDE 0.9 % IV BOLUS
1000.0000 mL | Freq: Once | INTRAVENOUS | Status: AC
  Administered 2022-03-30: 1000 mL via INTRAVENOUS

## 2022-03-30 MED ORDER — LATANOPROST 0.005 % OP SOLN
1.0000 [drp] | Freq: Every day | OPHTHALMIC | Status: DC
  Administered 2022-03-30 – 2022-04-03 (×4): 1 [drp] via OPHTHALMIC
  Filled 2022-03-30: qty 2.5

## 2022-03-30 MED ORDER — PROPRANOLOL HCL ER 60 MG PO CP24
60.0000 mg | ORAL_CAPSULE | Freq: Every day | ORAL | Status: DC
  Administered 2022-03-31 – 2022-04-03 (×4): 60 mg via ORAL
  Filled 2022-03-30 (×4): qty 1

## 2022-03-30 MED ORDER — SODIUM CHLORIDE 0.9 % IV BOLUS
500.0000 mL | Freq: Once | INTRAVENOUS | Status: AC
Start: 2022-03-30 — End: 2022-03-30
  Administered 2022-03-30: 500 mL via INTRAVENOUS

## 2022-03-30 MED ORDER — CARBIDOPA-LEVODOPA 25-100 MG PO TABS
1.5000 | ORAL_TABLET | ORAL | Status: DC
  Administered 2022-03-31 – 2022-04-03 (×17): 1.5 via ORAL
  Filled 2022-03-30 (×15): qty 2

## 2022-03-30 MED ORDER — CARBIDOPA-LEVODOPA ER 50-200 MG PO TBCR
1.0000 | EXTENDED_RELEASE_TABLET | Freq: Every day | ORAL | Status: DC
Start: 2022-03-30 — End: 2022-04-03
  Administered 2022-03-30 – 2022-04-02 (×4): 1 via ORAL
  Filled 2022-03-30 (×5): qty 1

## 2022-03-30 MED ORDER — APIXABAN 5 MG PO TABS
5.0000 mg | ORAL_TABLET | Freq: Two times a day (BID) | ORAL | Status: DC
  Administered 2022-03-30 – 2022-04-03 (×8): 5 mg via ORAL
  Filled 2022-03-30 (×8): qty 1

## 2022-03-30 MED ORDER — DEXTROSE 5 % IV SOLN: INTRAVENOUS | Status: DC

## 2022-03-30 MED ORDER — ACETAMINOPHEN 325 MG PO TABS: 650.0000 mg | ORAL_TABLET | Freq: Four times a day (QID) | ORAL | Status: DC | PRN

## 2022-03-30 NOTE — ED Triage Notes (Signed)
Pt BIB EMS from home for fall, unknown amount of down time. Pt lives alone and neighbors heard him yelling. Pt has skin breakdown on right hip, and on his back, right knee is swollen, pt c/o of pain in right hip. He is on eliquis for a-fib and dark tarry stools were noted.

## 2022-03-30 NOTE — ED Notes (Signed)
When patient arrived at 1415 today he was covered in dried feces that appeared to have been there for days. This RN and [place], NT cleaned up patient and noted large abrasions and superficial sores beginnings of which look like pressure sores on his back and right hip. Blistering on his back resembles a burn, potentially carpet burns. Pt lives alone at home and is unable to clarify when he fell. Pt is a poor historian. EMS reported that pt was found only after neighbors heard him calling for help. Pt is unable to confirm if family are active participants in his health.

## 2022-03-30 NOTE — ED Notes (Signed)
Patient transported to CT 

## 2022-03-30 NOTE — ED Provider Notes (Signed)
Pan scan, AMS, found down for undetermined time, EMS patient, neighbors Physical Exam  BP (!) 111/92   Pulse (!) 53   Temp (!) 97.4 F (36.3 C) (Rectal)   Resp (!) 23   SpO2 93%   Physical Exam Vitals and nursing note reviewed.  Constitutional:      General: He is not in acute distress. HENT:     Head: Normocephalic and atraumatic.  Eyes:     Conjunctiva/sclera: Conjunctivae normal.  Cardiovascular:     Rate and Rhythm: Normal rate.  Pulmonary:     Effort: Pulmonary effort is normal.  Musculoskeletal:     Cervical back: Normal range of motion and neck supple.  Skin:    General: Skin is warm and dry.  Neurological:     Mental Status: He is alert and oriented to person, place, and time.     Procedures  Procedures  ED Course / MDM    Medical Decision Making Amount and/or Complexity of Data Reviewed Labs: ordered. Radiology: ordered.  Risk Prescription drug management.   I reviewed and interpreted imaging including plain films of the chest, pelvis, right knee, right femur, CT head without contrast, CT cervical spine without contrast, and CT chest abdomen pelvis with contrast.1. No acute osseous injury of the right femur. 2. No acute osseous injury of the right knee. 3. No acute osseous injury of the pelvis.  1. No acute posttraumatic sequelae in the chest, abdomen or pelvis. 2. Rectal wall thickening concerning for proctitis. Recommend clinical correlation and follow-up to exclude underlying lesion. 3. Large left inguinal hernia containing bowel. No bowel obstruction. 4. Small bladder calculi. 5. Cholelithiasis. 6. Prostatomegaly. 7. Aortic Atherosclerosis  No acute fracture identified  Agree with radiologist findings.  I requested consultation with the hospitalist.  Dr. Toniann Fail.  He agrees to see and admit patient.   Based on patient's rhabdomyolysis and hyponatremia, believe he needs observation for fluid administration.  Admit to hospital       Pamala Duffel 03/30/22 1918    Vanetta Mulders, MD 04/02/22 1236

## 2022-03-30 NOTE — H&P (Signed)
History and Physical    Sean MilletVictor Nicklin ZOX:[id number] DOB: September 14, 1941 DOA: 03/30/2022  PCP: Storm FriskWright, Patrick E, MD  Patient coming from: Home.  Chief Complaint: Fall.  HPI: Sean Ward is a 81 y.o. male with history of Parkinson's disease, paroxysmal atrial fibrillation was brought to the ER after patient's neighbors heard that patient was yelling.  On checking patient was on the floor.  Patient states that he has been on the floor for the last 3 days he fell while trying to walk.  Not sure if he lost consciousness.  Denies any chest pain.  Has pain on the right side of the body and has a small bruise on the right hip area.  ED Course: In the ER labs show mildly elevated lactic acid levels and sodium was 147.  Trauma scans including CT head C-spine chest abdomen pelvis does not show any fractures.  Patient started on fluids for rhabdomyolysis and dehydration admitted for further management.  Review of Systems: As per HPI, rest all negative.   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 He has never used smokeless tobacco. He reports current alcohol use of about 3.0 standard drinks of alcohol per week. He reports that he does not use drugs.  No Known Allergies  Family History  Problem Relation Age of Onset   Deafness Mother    Deafness Sister    Heart disease Neg Hx     Prior to Admission medications   Medication Sig Start Date End Date Taking? Authorizing Provider   acetaminophen (TYLENOL) 500 MG tablet Take 1,000 mg by mouth every 6 (six) hours as needed for moderate pain.   Yes [provider]  apixaban (ELIQUIS) 5 MG TABS tablet TAKE 1 TABLET (5 MG TOTAL) BY MOUTH 2 (TWO) TIMES DAILY. Patient taking differently: Take 5 mg by mouth 2 (two) times daily. 09/11/21 09/11/22 Yes Storm FriskWright, Patrick E, MD  carbidopa-levodopa (SINEMET CR) 50-200 MG tablet TAKE 1 TABLET BY MOUTH AT BEDTIME. 05/10/21 05/10/22 Yes Storm FriskWright, Patrick E, MD  carbidopa-levodopa (SINEMET IR) 25-100 MG tablet TAKE 1 & 1/2 TABLETS BY  MOUTH 5 TIMES A DAY, AT 8 AM, 11 AM, 2 PM, 5 PM AND 8 PM. Patient taking differently: Take 1.5 tablets by mouth See admin instructions. Takes 5 times a day 05/10/21 05/10/22 Yes Storm FriskWright, Patrick E, MD  latanoprost (XALATAN) 0.005 % ophthalmic solution APPLY 1 DROP TO EYE NIGHTLY. Patient taking differently: Place 1 drop into both eyes at bedtime. 05/10/21 05/10/22 Yes Storm FriskWright, Patrick E, MD  Multiple Vitamin (MULTIVITAMIN) tablet Take 1 tablet by mouth daily.   Yes [provider]  propranolol ER (INDERAL LA) 60 MG 24 hr capsule TAKE 1 CAPSULE (60 MG TOTAL) BY MOUTH DAILY. Patient taking differently: Take 60 mg by mouth daily. 05/10/21 05/10/22 Yes Storm FriskWright, Patrick E, MD  latanoprost (XALATAN) 0.005 % ophthalmic solution Place 1 drop into both eyes nightly.  Patient not taking: Reported on 03/30/2022 09/18/21     latanoprost (XALATAN) 0.005 % ophthalmic solution Place 1 drop into both eyes nightly. Patient not taking: Reported on 03/30/2022 12/26/21       Physical Exam: Constitutional: Moderately built and nourished. Vitals:   03/30/22 1830 03/30/22 2000 03/30/22 2043 03/30/22 2128  BP: 121/73 117/78 123/66   Pulse: 94 (!) 109 95   Resp: 19 (!) 25 18   Temp:  98.1 F (36.7 C) 98 F (36.7 C)   TempSrc:   Oral   SpO2: 97% 98% 99%   Weight:    74.2 kg  Height:    5\' 9"  (1.753 m)   Eyes: Anicteric no pallor. ENMT: No discharge from ears eyes nose and  mouth. Neck: No mass felt.  No neck rigidity. Respiratory: No rhonchi or crepitations. Cardiovascular: S1-S2 heard. Abdomen: Soft nontender bowel sound present. Musculoskeletal: Swelling of the right knee. Skin: Skin tear on the right hip area. Neurologic: Alert awake oriented time place and person.  Moving all extremities has bradykinesia. Psychiatric: Appears normal.  Normal affect.   Labs on Admission: I have personally reviewed following labs and imaging studies  CBC: Recent Labs  Lab 03/30/22 1425 03/30/22 1457  WBC 12.2*  --   HGB 16.6 16.3  HCT 48.0 48.0  MCV 90.1  --   PLT 251  --    Basic Metabolic Panel: Recent Labs  Lab 03/30/22 1425 03/30/22 1457  NA 147* 148*  K 3.6 3.5  CL 113* 114*  CO2 21*  --   GLUCOSE 125* 119*  BUN 50* 46*  CREATININE 1.17 1.00  CALCIUM 8.8*  --    GFR: Estimated Creatinine Clearance: 59.9 mL/min (by C-G formula based on SCr of 1 mg/dL). Liver Function Tests: Recent Labs  Lab 03/30/22 1425  AST 95*  ALT 68*  ALKPHOS 111  BILITOT 2.9*  PROT 6.0*  ALBUMIN 2.8*   No results for input(s): "LIPASE", "AMYLASE" in the last 168 hours. Recent Labs  Lab 03/30/22 1425  AMMONIA <10   Coagulation Profile: Recent Labs  Lab 03/30/22 1425  INR 1.4*   Cardiac Enzymes: Recent Labs  Lab 03/30/22 1425  CKTOTAL 2,776*   BNP (last 3 results) No results for input(s): "PROBNP" in the last 8760 hours. HbA1C: No results for input(s): "HGBA1C" in the last 72 hours. CBG: No results for input(s): "GLUCAP" in the last 168 hours. Lipid Profile: No results for input(s): "CHOL", "HDL", "LDLCALC", "TRIG", "CHOLHDL", "LDLDIRECT" in the last 72 hours. Thyroid Function Tests: No results for input(s): "TSH", "T4TOTAL", "FREET4", "T3FREE", "THYROIDAB" in the last 72 hours. Anemia Panel: No results for input(s): "VITAMINB12", "FOLATE", "FERRITIN", "TIBC", "IRON", "RETICCTPCT" in the last 72 hours. Urine analysis:    Component Value  Date/Time   COLORURINE YELLOW 03/30/2022 1821   APPEARANCEUR HAZY (A) 03/30/2022 1821   APPEARANCEUR Clear 08/21/2017 1601   LABSPEC >1.046 (H) 03/30/2022 1821   PHURINE 6.0 03/30/2022 1821   GLUCOSEU NEGATIVE 03/30/2022 1821   HGBUR LARGE (A) 03/30/2022 1821   BILIRUBINUR NEGATIVE 03/30/2022 1821   BILIRUBINUR Negative 08/21/2017 1601   KETONESUR 20 (A) 03/30/2022 1821   PROTEINUR 30 (A) 03/30/2022 1821   NITRITE NEGATIVE 03/30/2022 1821   LEUKOCYTESUR NEGATIVE 03/30/2022 1821   Sepsis Labs: @LABRCNTIP (procalcitonin:4,lacticidven:4) ) Recent Results (from the past 240 hour(s))  Resp Panel by RT-PCR (Flu A&B, Covid) Anterior Nasal Swab     Status: None   Collection Time: 03/30/22  2:25 PM   Specimen:  Anterior Nasal Swab  Result Value Ref Range Status   SARS Coronavirus 2 by RT PCR NEGATIVE NEGATIVE Final    Comment: (NOTE) SARS-CoV-2 target nucleic acids are NOT DETECTED.  The SARS-CoV-2 RNA is generally detectable in upper respiratory specimens during the acute phase of infection. The lowest concentration of SARS-CoV-2 viral copies this assay can detect is 138 copies/mL. A negative result does not preclude SARS-Cov-2 infection and should not be used as the sole basis for treatment or other patient management decisions. A negative result may occur with  improper specimen collection/handling, submission of specimen other than nasopharyngeal swab, presence of viral mutation(s) within the areas targeted by this assay, and inadequate number of viral copies(<138 copies/mL). A negative result must be combined with clinical observations, patient history, and epidemiological information. The expected result is Negative.  Fact Sheet for Patients:  BloggerCourse.com  Fact Sheet for Healthcare Providers:  SeriousBroker.it  This test is no t yet approved or cleared by the [place] FDA and  has been authorized for detection  and/or diagnosis of SARS-CoV-2 by FDA under an Emergency Use Authorization (EUA). This EUA will remain  in effect (meaning this test can be used) for the duration of the COVID-19 declaration under Section 564(b)(1) of the Act, 21 U.S.C.section 360bbb-3(b)(1), unless the authorization is terminated  or revoked sooner.       Influenza A by PCR NEGATIVE NEGATIVE Final   Influenza B by PCR NEGATIVE NEGATIVE Final    Comment: (NOTE) The Xpert Xpress SARS-CoV-2/FLU/RSV plus assay is intended as an aid in the diagnosis of influenza from Nasopharyngeal swab specimens and should not be used as a sole basis for treatment. Nasal washings and aspirates are unacceptable for Xpert Xpress SARS-CoV-2/FLU/RSV testing.  Fact Sheet for Patients: BloggerCourse.com  Fact Sheet for Healthcare Providers: SeriousBroker.it  This test is not yet approved or cleared by the [place] FDA and has been authorized for detection and/or diagnosis of SARS-CoV-2 by FDA under an Emergency Use Authorization (EUA). This EUA will remain in effect (meaning this test can be used) for the duration of the COVID-19 declaration under Section 564(b)(1) of the Act, 21 U.S.C. section 360bbb-3(b)(1), unless the authorization is terminated or revoked.  Performed at [facility], [address], [place], [place] [address]      Radiological Exams on Admission: CT CERVICAL SPINE WO CONTRAST  Result Date: 03/30/2022 CLINICAL DATA:  Fall and found down at home. EXAM: CT CERVICAL SPINE WITHOUT CONTRAST TECHNIQUE: Multidetector CT imaging of the cervical spine was performed without intravenous contrast. Multiplanar CT image reconstructions were also generated. RADIATION DOSE REDUCTION: This exam was performed according to the departmental dose-optimization program which includes automated exposure control, adjustment of the mA and/or kV according to patient size and/or use of  iterative reconstruction technique. COMPARISON:  01/21/2014 FINDINGS: Alignment: No subluxation. Skull base and vertebrae: No acute fracture identified. Bilateral facet hypertrophy. Soft tissues and spinal canal: No prevertebral fluid or swelling. No visible canal hematoma. Disc levels: Progressive moderate degenerative disc disease of the cervical spine most significantly at the C4-5, C5-6 and C6-7 levels since the prior CT in 2015. Upper chest: Negative. IMPRESSION: No acute fracture identified. 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 Electronically Signed   By: Irish Lack M.D.   On: 03/30/2022 17:06   CT CHEST ABDOMEN PELVIS W CONTRAST  Result Date: 03/30/2022 CLINICAL DATA:  Trauma.  Fall. EXAM: CT CHEST, ABDOMEN, AND PELVIS  WITH CONTRAST TECHNIQUE: Multidetector CT imaging of the chest, abdomen and pelvis was performed following the standard protocol during bolus administration of intravenous contrast. RADIATION DOSE REDUCTION: This exam was performed according to the departmental dose-optimization program which includes automated exposure control, adjustment of the mA and/or kV according to patient size and/or use of iterative reconstruction technique. CONTRAST:  OMNIPAQUE IOHEXOL 300 MG/ML  SOLN COMPARISON:  None Available. FINDINGS: CT CHEST FINDINGS Cardiovascular: Heart is mildly enlarged. Aorta is normal in size. There are atherosclerotic calcifications of the aorta. There is no pericardial effusion. Mediastinum/Nodes: No enlarged mediastinal, hilar, or axillary lymph nodes. Thyroid gland, trachea, and esophagus demonstrate no significant findings. Lungs/Pleura: There is scarring in the lung apices. There is minimal dependent atelectasis bilaterally. No pleural effusion or pneumothorax. Musculoskeletal: No fractures are seen. CT ABDOMEN PELVIS FINDINGS Hepatobiliary: Gallstones are present. Liver and bile ducts are within normal  limits. Pancreas: Unremarkable. No pancreatic ductal dilatation or surrounding inflammatory changes. Spleen: Normal in size without focal abnormality. Adrenals/Urinary Tract: There is a 2.7 cm cyst in the superior pole the right kidney. Otherwise, of the adrenal adrenal glands and kidneys are within normal limits. Tiny layering calculi are seen in the bladder. Stomach/Bowel: There is some rectal wall thickening. No dilated bowel loops. No bowel obstruction, pneumatosis, focal inflammation or free air. Stomach is within normal limits. Vascular/Lymphatic: Aortic atherosclerosis. No enlarged abdominal or pelvic lymph nodes. Reproductive: Prostate gland is enlarged measuring 4.3 x 5.4 cm. Other: There is a large left inguinal hernia containing nondilated bowel and mesenteric fat. There is no ascites. Musculoskeletal: No acute fractures are seen. Degenerative changes affect the spine. IMPRESSION: 1. No acute posttraumatic sequelae in the chest, abdomen or pelvis. 2. Rectal wall thickening concerning for proctitis. Recommend clinical correlation and follow-up to exclude underlying lesion. 3. Large left inguinal hernia containing bowel. No bowel obstruction. 4. Small bladder calculi. 5. Cholelithiasis. 6. Prostatomegaly. 7.  Aortic Atherosclerosis (ICD10-I70.0). Electronically Signed   By: Darliss Cheney M.D.   On: 03/30/2022 16:58   CT HEAD WO CONTRAST  Result Date: 03/30/2022 CLINICAL DATA:  Fall and found down at home. EXAM: CT HEAD WITHOUT CONTRAST TECHNIQUE: Contiguous axial images were obtained from the base of the skull through the vertex without intravenous contrast. RADIATION DOSE REDUCTION: This exam was performed according to the departmental dose-optimization program which includes automated exposure control, adjustment of the mA and/or kV according to patient size and/or use of iterative reconstruction technique. COMPARISON:  Prior CT of the head on 01/21/2014 FINDINGS: Brain: There is some progression of  diffuse cortical atrophy since 2015. Some progressive mild periventricular small vessel disease also present. The brain demonstrates no evidence of hemorrhage, infarction, edema, mass effect, extra-axial fluid collection, hydrocephalus or mass lesion. Vascular: No hyperdense vessel or unexpected calcification. Skull: Normal. Negative for fracture or focal lesion. Sinuses/Orbits: Mucosal thickening in the visualized right maxillary antrum. Other: None. IMPRESSION: Progressive atrophy and small vessel disease since the prior CT in 2015. No acute findings. Electronically Signed   By: Irish Lack M.D.   On: 03/30/2022 16:56   DG Chest Port 1 View  Result Date: 03/30/2022 CLINICAL DATA:  RIGHT knee pain, patient found down EXAM: PORTABLE CHEST 1 VIEW COMPARISON:  None Available. FINDINGS: Scoliosis of the spine. Normal cardiac silhouette. Lungs are clear. No fracture. No pneumothorax. IMPRESSION: No acute cardiopulmonary process. Electronically Signed   By: Genevive Bi M.D.   On: 03/30/2022 14:34   DG Pelvis Portable  Result Date: 03/30/2022  CLINICAL DATA:  Found on the ground, right femur pain EXAM: RIGHT FEMUR PORTABLE 1 VIEW; PORTABLE RIGHT KNEE - 1-2 VIEW; PORTABLE PELVIS 1-2 VIEWS COMPARISON:  None Available. FINDINGS: No acute hip fracture or dislocation. 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 Severe osteoarthritis of the patellofemoral compartment. No knee joint effusion. No aggressive osseous lesion. Normal alignment. Generalized osteopenia. Soft tissue are unremarkable. No radiopaque foreign body or soft tissue emphysema. IMPRESSION: 1.  No acute osseous injury of the right femur. 2.  No acute osseous injury of the right knee. 3.  No acute osseous injury of the pelvis. Electronically Signed   By: Elige Ko M.D.   On: 03/30/2022 14:29   DG Knee Right Port  Result Date: 03/30/2022 CLINICAL DATA:  Found on the ground, right femur pain EXAM: RIGHT FEMUR PORTABLE  1 VIEW; PORTABLE RIGHT KNEE - 1-2 VIEW; PORTABLE PELVIS 1-2 VIEWS COMPARISON:  None Available. FINDINGS: No acute hip fracture or dislocation. 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 Severe osteoarthritis of the patellofemoral compartment. No knee joint effusion. No aggressive osseous lesion. Normal alignment. Generalized osteopenia. Soft tissue are unremarkable. No radiopaque foreign body or soft tissue emphysema. IMPRESSION: 1.  No acute osseous injury of the right femur. 2.  No acute osseous injury of the right knee. 3.  No acute osseous injury of the pelvis. Electronically Signed   By: Elige Ko M.D.   On: 03/30/2022 14:29   DG FEMUR PORT, 1V RIGHT  Result Date: 03/30/2022 CLINICAL DATA:  Found on the ground, right femur pain EXAM: RIGHT FEMUR PORTABLE 1 VIEW; PORTABLE RIGHT KNEE - 1-2 VIEW; PORTABLE PELVIS 1-2 VIEWS COMPARISON:  None Available. FINDINGS: No acute hip fracture or dislocation. 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 Severe osteoarthritis of the patellofemoral compartment. No knee joint effusion. No aggressive osseous lesion. Normal alignment. Generalized osteopenia. Soft tissue are unremarkable. No radiopaque foreign body or soft tissue emphysema. IMPRESSION: 1.  No acute osseous injury of the right femur. 2.  No acute osseous injury of the right knee. 3.  No acute osseous injury of the pelvis. Electronically Signed   By: Elige Ko M.D.   On: 03/30/2022 14:29    EKG: Independently reviewed.  A-fib rate around 100 bpm.  Assessment/Plan Principal Problem:   Rhabdomyolysis Active Problems:   Atrial fibrillation (HCC)   Parkinson disease (HCC)   Dehydration    Fall with rhabdomyolysis for which patient is receiving fluids recheck CK levels.  Get physical therapy consult.  May need rehab. Dehydration likely from patient not been able to eat for the last 3 days.  Sodium was 147 lactic acid is elevated.  Consistent  with dehydration.  Gently hydrate follow metabolic panel. History of Parkinson disease on Sinemet. History of A-fib on Eliquis and propanolol. Right knee swelling being followed by Dr. Lajoyce Corners patient states he was likely to get knee arthroplasty within the next month.  Also is going to be evaluated by orthopedic surgeon Dr. Ophelia Charter for low back pain.   DVT prophylaxis: Eliquis. Code Status: Full code. Family Communication: Discussed with patient. Disposition Plan: May need rehab. Consults called: Physical therapy. Admission status: Observation.   Eduard Clos MD [facility] Pager [phone].  If 7PM-7AM, please contact night-coverage [email or web address] Password [facility]  03/30/2022, 9:41 PM

## 2022-03-30 NOTE — ED Provider Notes (Signed)
[facility] EMERGENCY DEPARTMENT Provider Note   CSN: [id number] Arrival date & time: 03/30/22  1344     History  Chief Complaint  Patient presents with   [name]    Sean Ward is a 81 y.o. male history A-fib on Eliquis, Parkinson's.  Patient arrived via EMS after being found on the ground, unclear how long patient had been lying on the ground he is complaining of right hip and knee pain, he denies any additional injuries or complaints.  Patient arrives in a c-collar today.  Patient is alert to self place and year, he does not recall why he was on the ground or how long he has been there.    Level 5 caveat altered mental status  HPI     Home Medications Prior to Admission medications   Medication Sig Start Date End Date Taking? Authorizing Provider  apixaban (ELIQUIS) 5 MG TABS tablet TAKE 1 TABLET (5 MG TOTAL) BY MOUTH 2 (TWO) TIMES DAILY. 09/11/21 09/11/22  [name], MD  aspirin 325 MG tablet Take 325 mg by mouth as needed for moderate pain.    [provider]  carbidopa-levodopa (SINEMET CR) 50-200 MG tablet TAKE 1 TABLET BY MOUTH AT BEDTIME. 05/10/21 05/10/22  [name], MD  carbidopa-levodopa (SINEMET IR) 25-100 MG tablet TAKE 1 & 1/2 TABLETS BY  MOUTH 5 TIMES A DAY, AT 8 AM, 11 AM, 2 PM, 5 PM AND 8 PM. 05/10/21 05/10/22  [name], MD  latanoprost (XALATAN) 0.005 % ophthalmic solution APPLY 1 DROP TO EYE NIGHTLY. 05/10/21 05/10/22  [name], MD  latanoprost (XALATAN) 0.005 % ophthalmic solution Place 1 drop into both eyes nightly. 09/18/21     latanoprost (XALATAN) 0.005 % ophthalmic solution Place 1 drop into both eyes nightly. 12/26/21     Multiple Vitamin (MULTIVITAMIN) tablet Take 1 tablet by mouth daily.    [provider]  propranolol ER (INDERAL LA) 60 MG 24 hr capsule TAKE 1 CAPSULE (60 MG TOTAL) BY MOUTH DAILY. 05/10/21 05/10/22  [name], MD      Allergies    Patient has no known allergies.     Review of Systems   Review of Systems  Unable to perform ROS: Mental status change    Physical Exam Updated Vital Signs BP (!) 111/92   Pulse (!) 53   Temp (!) 97.4 F (36.3 C) (Rectal)   Resp (!) 23   SpO2 93%  Physical Exam Constitutional:      Appearance: He is well-developed. He is ill-appearing. He is not toxic-appearing.  HENT:     Head: Normocephalic and atraumatic.     Jaw: There is normal jaw occlusion. No trismus.     Right Ear: External ear normal.     Left Ear: External ear normal.     Nose: Nose normal.     Mouth/Throat:     Mouth: Mucous membranes are dry.     Pharynx: Oropharynx is clear.  Eyes:     General: Vision grossly intact. Gaze aligned appropriately.     Extraocular Movements: Extraocular movements intact.  Neck:     Trachea: Trachea and phonation normal.     Comments: C-collar in place Cardiovascular:     Rate and Rhythm: Normal rate and regular rhythm.     Pulses:          Radial pulses are 2+ on the right side and 2+ on the left side.  Dorsalis pedis pulses are 2+ on the right side and 2+ on the left side.  Pulmonary:     Effort: Pulmonary effort is normal.     Breath sounds: Normal breath sounds and air entry.  Chest:     Chest wall: No deformity.  Abdominal:     Palpations: Abdomen is soft.     Tenderness: There is no abdominal tenderness. There is no guarding or rebound.  Genitourinary:    Comments: Nontender inguinal hernia Musculoskeletal:     Comments: No pain with palpation of the major joints of the bilateral upper extremities.  Patient is tender to palpation of the right hip and knee.  Superficial ulcer overlying the right hip appears to be where patient has been lying on the ground.  Patient refuses to move the right hip or knee, no obvious deformity likely shortening or rotation.  Sensation and capillary refill intact distally.  No midline spinal tenderness palpation.  No crepitus step-off or deformity the spine.  Pelvis  stable to compression without pain.  Feet:     Right foot:     Protective Sensation: 3 sites tested.       Left foot:     Protective Sensation: 3 sites tested.  3 sites sensed.  Skin:      Neurological:     Mental Status: He is alert.     GCS: GCS eye subscore is 4. GCS verbal subscore is 4. GCS motor subscore is 6.     Cranial Nerves: Cranial nerves 2-12 are intact.     Sensory: Sensation is intact.     Coordination: Coordination is intact.  Psychiatric:        Behavior: Behavior is cooperative.     ED Results / Procedures / Treatments   Labs (all labs ordered are listed, but only abnormal results are displayed) Labs Reviewed  CBC - Abnormal; Notable for the following components:      Result Value   WBC 12.2 (*)    All other components within normal limits  LACTIC ACID, PLASMA - Abnormal; Notable for the following components:   Lactic Acid, Venous 2.2 (*)    All other components within normal limits  PROTIME-INR - Abnormal; Notable for the following components:   Prothrombin Time 16.6 (*)    INR 1.4 (*)    All other components within normal limits  I-STAT CHEM 8, ED - Abnormal; Notable for the following components:   Sodium 148 (*)    Chloride 114 (*)    BUN 46 (*)    Glucose, Bld 119 (*)    All other components within normal limits  RESP PANEL BY RT-PCR (FLU A&B, COVID) [id number]  AMMONIA  COMPREHENSIVE METABOLIC PANEL  ETHANOL  URINALYSIS, ROUTINE W REFLEX MICROSCOPIC  CK  POC OCCULT BLOOD, ED    EKG None  Radiology No results found.  Procedures Procedures    Medications Ordered in ED Medications  sodium chloride 0.9 % bolus 500 mL (500 mLs Intravenous New Bag/Given 03/30/22 1441)    ED Course/ Medical Decision Making/ A&P                           Medical Decision Making 81 year old male found down at home, lives alone unclear downtime.  He is alert to self place and year however not to event.  He has skin breakdown over the right hip complaining of  right hip and right knee pain.  He arrives in  a c-collar.  He is no additional complaints at this time, level 5 caveat for altered mental status.  Of note patient is on Eliquis.  CT head cervical spine were ordered.  Plain x-rays of the chest pelvis right femur and right knee were ordered as well.  Labs ordered include CK to assess for rhabdomyolysis.  Pending creatinine will order CT chest and pelvis with contrast.  No gross cranial nerve deficits on exam.  Equal strength x4 extremities with grip and dorsi/plantarflexion.  Amount and/or Complexity of Data Reviewed Labs: ordered.    Details: Lactic acid slightly evaluated at 2.2. INR 1.4. I-STAT shows BUN of 46, sodium 148.  Creatinine 1.0.  CT chest abdomen pelvis ordered given reassuring creatinine. Ammonia negative. CBC shows leukocytosis of 12.2, no anemia or thrombocytopenia. Radiology: ordered.  ----- Patient reassessed, vital signs stable.  Pan scan ordered.  Imaging and labs are currently pending.  Care handoff given to Antonieta Pert, PA-C at shift change.  Plan of care is to await remaining labs as well as imaging anticipate admission for AMS.  Final disposition per oncoming team.  Patient seen and evaluated by Dr. Manus Gunning during this visit who agrees with plan of care  Note: Portions of this report may have been transcribed using voice recognition software. Every effort was made to ensure accuracy; however, inadvertent computerized transcription errors may still be present.         Final Clinical Impression(s) / ED Diagnoses Final diagnoses:  None    Rx / DC Orders ED Discharge Orders     None         Elizabeth Palau 03/30/22 1516    Glynn Octave, MD 03/30/22 1920

## 2022-03-30 NOTE — ED Notes (Signed)
PT Occ Blood  entered in system as Positive , however ,  test was neg. And reported to Dr. Manus Gunning. Edit sheet was faxed to POC.

## 2022-03-31 DIAGNOSIS — M419 Scoliosis, unspecified: Secondary | ICD-10-CM | POA: Diagnosis present

## 2022-03-31 DIAGNOSIS — W1830XA Fall on same level, unspecified, initial encounter: Secondary | ICD-10-CM | POA: Diagnosis present

## 2022-03-31 DIAGNOSIS — E876 Hypokalemia: Secondary | ICD-10-CM | POA: Diagnosis not present

## 2022-03-31 DIAGNOSIS — L89892 Pressure ulcer of other site, stage 2: Secondary | ICD-10-CM | POA: Diagnosis present

## 2022-03-31 DIAGNOSIS — Z7901 Long term (current) use of anticoagulants: Secondary | ICD-10-CM | POA: Diagnosis not present

## 2022-03-31 DIAGNOSIS — Z20822 Contact with and (suspected) exposure to covid-19: Secondary | ICD-10-CM | POA: Diagnosis present

## 2022-03-31 DIAGNOSIS — G20 Parkinson's disease: Secondary | ICD-10-CM | POA: Diagnosis present

## 2022-03-31 DIAGNOSIS — L89132 Pressure ulcer of right lower back, stage 2: Secondary | ICD-10-CM | POA: Diagnosis not present

## 2022-03-31 DIAGNOSIS — L89101 Pressure ulcer of unspecified part of back, stage 1: Secondary | ICD-10-CM | POA: Diagnosis present

## 2022-03-31 DIAGNOSIS — Y92039 Unspecified place in apartment as the place of occurrence of the external cause: Secondary | ICD-10-CM | POA: Diagnosis not present

## 2022-03-31 DIAGNOSIS — M25461 Effusion, right knee: Secondary | ICD-10-CM | POA: Diagnosis present

## 2022-03-31 DIAGNOSIS — D72829 Elevated white blood cell count, unspecified: Secondary | ICD-10-CM | POA: Diagnosis present

## 2022-03-31 DIAGNOSIS — R4182 Altered mental status, unspecified: Secondary | ICD-10-CM | POA: Diagnosis present

## 2022-03-31 DIAGNOSIS — M6282 Rhabdomyolysis: Secondary | ICD-10-CM | POA: Diagnosis present

## 2022-03-31 DIAGNOSIS — L89212 Pressure ulcer of right hip, stage 2: Secondary | ICD-10-CM | POA: Diagnosis present

## 2022-03-31 DIAGNOSIS — E870 Hyperosmolality and hypernatremia: Secondary | ICD-10-CM | POA: Diagnosis present

## 2022-03-31 DIAGNOSIS — M1711 Unilateral primary osteoarthritis, right knee: Secondary | ICD-10-CM | POA: Diagnosis present

## 2022-03-31 DIAGNOSIS — Z79899 Other long term (current) drug therapy: Secondary | ICD-10-CM | POA: Diagnosis not present

## 2022-03-31 DIAGNOSIS — E860 Dehydration: Secondary | ICD-10-CM | POA: Diagnosis present

## 2022-03-31 DIAGNOSIS — S7001XA Contusion of right hip, initial encounter: Secondary | ICD-10-CM | POA: Diagnosis present

## 2022-03-31 DIAGNOSIS — I480 Paroxysmal atrial fibrillation: Secondary | ICD-10-CM | POA: Diagnosis present

## 2022-03-31 DIAGNOSIS — K4090 Unilateral inguinal hernia, without obstruction or gangrene, not specified as recurrent: Secondary | ICD-10-CM | POA: Diagnosis present

## 2022-03-31 DIAGNOSIS — T796XXA Traumatic ischemia of muscle, initial encounter: Secondary | ICD-10-CM | POA: Diagnosis present

## 2022-03-31 LAB — COMPREHENSIVE METABOLIC PANEL
ALT: 13 U/L (ref 0–44)
AST: 87 U/L — ABNORMAL HIGH (ref 15–41)
Albumin: 2.4 g/dL — ABNORMAL LOW (ref 3.5–5.0)
Alkaline Phosphatase: 95 U/L (ref 38–126)
Anion gap: 8 (ref 5–15)
BUN: 39 mg/dL — ABNORMAL HIGH (ref 8–23)
CO2: 22 mmol/L (ref 22–32)
Calcium: 8.3 mg/dL — ABNORMAL LOW (ref 8.9–10.3)
Chloride: 113 mmol/L — ABNORMAL HIGH (ref 98–111)
Creatinine, Ser: 1.14 mg/dL (ref 0.61–1.24)
GFR, Estimated: >60 mL/min (ref >60)
Glucose, Bld: 135 mg/dL — ABNORMAL HIGH (ref 70–99)
Potassium: 3.9 mmol/L (ref 3.5–5.1)
Sodium: 143 mmol/L (ref 135–145)
Total Bilirubin: 2.7 mg/dL — ABNORMAL HIGH (ref 0.3–1.2)
Total Protein: 5 g/dL — ABNORMAL LOW (ref 6.5–8.1)

## 2022-03-31 LAB — CBC
HCT: 42.1 % (ref 39.0–52.0)
Hemoglobin: 14.3 g/dL (ref 13.0–17.0)
MCH: 30.7 pg (ref 26.0–34.0)
MCHC: 34 g/dL (ref 30.0–36.0)
MCV: 90.3 fL (ref 80.0–100.0)
Platelets: 219 10*3/uL (ref 150–400)
RBC: 4.66 MIL/uL (ref 4.22–5.81)
RDW: 15.4 % (ref 11.5–15.5)
WBC: 10.4 10*3/uL (ref 4.0–10.5)
nRBC: 0 % (ref 0.0–0.2)

## 2022-03-31 LAB — TYPE AND SCREEN
ABO/RH(D): O POS
Antibody Screen: NEGATIVE

## 2022-03-31 LAB — CK: Total CK: 2092 U/L — ABNORMAL HIGH (ref 49–397)

## 2022-03-31 MED ORDER — DEXTROSE 5 % IV SOLN: INTRAVENOUS | Status: AC

## 2022-03-31 NOTE — Progress Notes (Signed)
PROGRESS NOTE    Sean Ward  [id number] DOB: [birth year] DOA: 03/30/2022 PCP: Elsie Stain, MD    Brief Narrative:  81 year old with history of Parkinson's disease, paroxysmal A-fib on Eliquis, severe right knee arthritis fell on the floor of his apartment and could not get up for 4 days, kept yelling out for help and finally someone heard him after 4 days and called EMS and brought to ER.  Denied any trauma but he had some bruise on the right hip area. In the emergency room hemodynamically stable.  Sodium 147.  Creatinine kinase 2700.  A skeletal survey negative.   Assessment & Plan:   Traumatic rhabdomyolysis, fall, ambulatory dysfunction due to severe right knee pain, moderate dehydration and hyponatremia: -Continue IV fluids, intake output monitoring, recheck CK levels and electrolytes tomorrow morning. -PT OT, fall precautions, refer for rehab. -Unable to get up because of right knee arthritis, if he does go to rehab, he may be able to get right knee arthroplasty and improve his mobility.  Paroxysmal A-fib: No evidence of injury or bleeding.  Rate controlled in sinus rhythm on propanolol.  Continue Eliquis.  Parkinson's disease: Fairly controlled on Sinemet.  Continue.   DVT prophylaxis:  apixaban (ELIQUIS) tablet 5 mg   Code Status: Full code Family Communication: None.  Patient does not have any family members in town.  He has children live in [place] but they are estranged as per the patient. Disposition Plan: Status is: Observation The patient will require care spanning > 2 midnights and should be moved to inpatient because: Unable to mobilize, acute rhabdomyolysis     Consultants:  None  Procedures:  None  Antimicrobials:  None   Subjective: Patient seen and examined.  Hurts all over.  Hurts around the right hip.  Wants to take shower.  Cannot walk.  Objective: Vitals:   03/30/22 2043 03/30/22 2128 03/31/22 0333 03/31/22 0904  BP: 123/66   124/82 97/68  Pulse: 95  88 90  Resp: 18  18 18   Temp: 98 F (36.7 C)  97.8 F (36.6 C) 97.8 F (36.6 C)  TempSrc: Oral  Oral Oral  SpO2: 99%  98% 100%  Weight:  74.2 kg    Height:  5\' 9"  (1.753 m)      Intake/Output Summary (Last 24 hours) at 03/31/2022 1305 Last data filed at 03/31/2022 0900 Gross per 24 hour  Intake 3061.2 ml  Output 300 ml  Net 2761.2 ml   Filed Weights   03/30/22 2128  Weight: 74.2 kg    Examination:  General exam: Appears calm and comfortable at rest.  Slightly anxious. Respiratory system: Clear to auscultation. Respiratory effort normal.  No added sounds. Cardiovascular system: S1 & S2 heard, RRR.  Gastrointestinal system: Abdomen is nondistended, soft and nontender. No organomegaly or masses felt. Normal bowel sounds heard. Central nervous system: Alert and oriented. No focal neurological deficits. Extremities: Symmetric 5 x 5 power. Right knee with chronic deformity and stiffness.    Data Reviewed: I have personally reviewed following labs and imaging studies  CBC: Recent Labs  Lab 03/30/22 1425 03/30/22 1457 03/30/22 2253 03/31/22 0216  WBC 12.2*  --  10.5 10.4  HGB 16.6 16.3 14.6 14.3  HCT 48.0 48.0 42.6 42.1  MCV 90.1  --  90.3 90.3  PLT 251  --  227 [id number]   Basic Metabolic Panel: Recent Labs  Lab 03/30/22 1425 03/30/22 1457 03/31/22 0216  NA 147* 148* 143  K 3.6 3.5 3.9  CL 113* 114* 113*  CO2 21*  --  22  GLUCOSE 125* 119* 135*  BUN 50* 46* 39*  CREATININE 1.17 1.00 1.14  CALCIUM 8.8*  --  8.3*   GFR: Estimated Creatinine Clearance: 52.5 mL/min (by C-G formula based on SCr of 1.14 mg/dL). Liver Function Tests: Recent Labs  Lab 03/30/22 1425 03/31/22 0216  AST 95* 87*  ALT 68* 13  ALKPHOS 111 95  BILITOT 2.9* 2.7*  PROT 6.0* 5.0*  ALBUMIN 2.8* 2.4*   No results for input(s): "LIPASE", "AMYLASE" in the last 168 hours. Recent Labs  Lab 03/30/22 1425  AMMONIA <10   Coagulation Profile: Recent Labs  Lab  03/30/22 1425  INR 1.4*   Cardiac Enzymes: Recent Labs  Lab 03/30/22 1425 03/31/22 0216  CKTOTAL 2,776* 2,092*   BNP (last 3 results) No results for input(s): "PROBNP" in the last 8760 hours. HbA1C: No results for input(s): "HGBA1C" in the last 72 hours. CBG: No results for input(s): "GLUCAP" in the last 168 hours. Lipid Profile: No results for input(s): "CHOL", "HDL", "LDLCALC", "TRIG", "CHOLHDL", "LDLDIRECT" in the last 72 hours. Thyroid Function Tests: No results for input(s): "TSH", "T4TOTAL", "FREET4", "T3FREE", "THYROIDAB" in the last 72 hours. Anemia Panel: No results for input(s): "VITAMINB12", "FOLATE", "FERRITIN", "TIBC", "IRON", "RETICCTPCT" in the last 72 hours. Sepsis Labs: Recent Labs  Lab 03/30/22 1425  LATICACIDVEN 2.2*    Recent Results (from the past 240 hour(s))  Resp Panel by RT-PCR (Flu A&B, Covid) Anterior Nasal Swab     Status: None   Collection Time: 03/30/22  2:25 PM   Specimen: Anterior Nasal Swab  Result Value Ref Range Status   SARS Coronavirus 2 by RT PCR NEGATIVE NEGATIVE Final    Comment: (NOTE) SARS-CoV-2 target nucleic acids are NOT DETECTED.  The SARS-CoV-2 RNA is generally detectable in upper respiratory specimens during the acute phase of infection. The lowest concentration of SARS-CoV-2 viral copies this assay can detect is 138 copies/mL. A negative result does not preclude SARS-Cov-2 infection and should not be used as the sole basis for treatment or other patient management decisions. A negative result may occur with  improper specimen collection/handling, submission of specimen other than nasopharyngeal swab, presence of viral mutation(s) within the areas targeted by this assay, and inadequate number of viral copies(<138 copies/mL). A negative result must be combined with clinical observations, patient history, and epidemiological information. The expected result is Negative.  Fact Sheet for Patients:   BloggerCourse.comhttps://www.fda.gov/media/152166/download  Fact Sheet for Healthcare Providers:  SeriousBroker.ithttps://www.fda.gov/media/152162/download  This test is no t yet approved or cleared by the [place] FDA and  has been authorized for detection and/or diagnosis of SARS-CoV-2 by FDA under an Emergency Use Authorization (EUA). This EUA will remain  in effect (meaning this test can be used) for the duration of the COVID-19 declaration under Section 564(b)(1) of the Act, 21 U.S.C.section 360bbb-3(b)(1), unless the authorization is terminated  or revoked sooner.       Influenza A by PCR NEGATIVE NEGATIVE Final   Influenza B by PCR NEGATIVE NEGATIVE Final    Comment: (NOTE) The Xpert Xpress SARS-CoV-2/FLU/RSV plus assay is intended as an aid in the diagnosis of influenza from Nasopharyngeal swab specimens and should not be used as a sole basis for treatment. Nasal washings and aspirates are unacceptable for Xpert Xpress SARS-CoV-2/FLU/RSV testing.  Fact Sheet for Patients: BloggerCourse.comhttps://www.fda.gov/media/152166/download  Fact Sheet for Healthcare Providers: SeriousBroker.ithttps://www.fda.gov/media/152162/download  This test is not yet approved or cleared by the [place] FDA  and has been authorized for detection and/or diagnosis of SARS-CoV-2 by FDA under an Emergency Use Authorization (EUA). This EUA will remain in effect (meaning this test can be used) for the duration of the COVID-19 declaration under Section 564(b)(1) of the Act, 21 U.S.C. section 360bbb-3(b)(1), unless the authorization is terminated or revoked.  Performed at [facility], [place] [address], [place], [place] [address]          Radiology Studies: CT CERVICAL SPINE WO CONTRAST  Result Date: 03/30/2022 CLINICAL DATA:  Fall and found down at home. EXAM: CT CERVICAL SPINE WITHOUT CONTRAST TECHNIQUE: Multidetector CT imaging of the cervical spine was performed without intravenous contrast. Multiplanar CT image reconstructions were  also generated. RADIATION DOSE REDUCTION: This exam was performed according to the departmental dose-optimization program which includes automated exposure control, adjustment of the mA and/or kV according to patient size and/or use of iterative reconstruction technique. COMPARISON:  01/21/2014 FINDINGS: Alignment: No subluxation. Skull base and vertebrae: No acute fracture identified. Bilateral facet hypertrophy. Soft tissues and spinal canal: No prevertebral fluid or swelling. No visible canal hematoma. Disc levels: Progressive moderate degenerative disc disease of the cervical spine most significantly at the C4-5, C5-6 and C6-7 levels since the prior CT in 2015. Upper chest: Negative. IMPRESSION: No acute fracture identified. 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 Electronically Signed   By: Aletta Edouard M.D.   On: 03/30/2022 17:06   CT CHEST ABDOMEN PELVIS W CONTRAST  Result Date: 03/30/2022 CLINICAL DATA:  Trauma.  Fall. EXAM: CT CHEST, ABDOMEN, AND PELVIS WITH CONTRAST TECHNIQUE: Multidetector CT imaging of the chest, abdomen and pelvis was performed following the standard protocol during bolus administration of intravenous contrast. RADIATION DOSE REDUCTION: This exam was performed according to the departmental dose-optimization program which includes automated exposure control, adjustment of the mA and/or kV according to patient size and/or use of iterative reconstruction technique. CONTRAST:  134mL OMNIPAQUE IOHEXOL 300 MG/ML  SOLN COMPARISON:  None Available. FINDINGS: CT CHEST FINDINGS Cardiovascular: Heart is mildly enlarged. Aorta is normal in size. There are atherosclerotic calcifications of the aorta. There is no pericardial effusion. Mediastinum/Nodes: No enlarged mediastinal, hilar, or axillary lymph nodes. Thyroid gland, trachea, and esophagus demonstrate no significant findings. Lungs/Pleura: There is scarring in the lung apices.  There is minimal dependent atelectasis bilaterally. No pleural effusion or pneumothorax. Musculoskeletal: No fractures are seen. CT ABDOMEN PELVIS FINDINGS Hepatobiliary: Gallstones are present. Liver and bile ducts are within normal limits. Pancreas: Unremarkable. No pancreatic ductal dilatation or surrounding inflammatory changes. Spleen: Normal in size without focal abnormality. Adrenals/Urinary Tract: There is a 2.7 cm cyst in the superior pole the right kidney. Otherwise, of the adrenal adrenal glands and kidneys are within normal limits. Tiny layering calculi are seen in the bladder. Stomach/Bowel: There is some rectal wall thickening. No dilated bowel loops. No bowel obstruction, pneumatosis, focal inflammation or free air. Stomach is within normal limits. Vascular/Lymphatic: Aortic atherosclerosis. No enlarged abdominal or pelvic lymph nodes. Reproductive: Prostate gland is enlarged measuring 4.3 x 5.4 cm. Other: There is a large left inguinal hernia containing nondilated bowel and mesenteric fat. There is no ascites. Musculoskeletal: No acute fractures are seen. Degenerative changes affect the spine. IMPRESSION: 1. No acute posttraumatic sequelae in the chest, abdomen or pelvis. 2. Rectal wall thickening concerning for proctitis. Recommend clinical correlation and follow-up to exclude underlying lesion. 3. Large left inguinal hernia containing bowel. No bowel obstruction. 4. Small bladder  calculi. 5. Cholelithiasis. 6. Prostatomegaly. 7.  Aortic Atherosclerosis (ICD10-I70.0). Electronically Signed   By: Ronney Asters M.D.   On: 03/30/2022 16:58   CT HEAD WO CONTRAST  Result Date: 03/30/2022 CLINICAL DATA:  Fall and found down at home. EXAM: CT HEAD WITHOUT CONTRAST TECHNIQUE: Contiguous axial images were obtained from the base of the skull through the vertex without intravenous contrast. RADIATION DOSE REDUCTION: This exam was performed according to the departmental dose-optimization program which  includes automated exposure control, adjustment of the mA and/or kV according to patient size and/or use of iterative reconstruction technique. COMPARISON:  Prior CT of the head on 01/21/2014 FINDINGS: Brain: There is some progression of diffuse cortical atrophy since 2015. Some progressive mild periventricular small vessel disease also present. The brain demonstrates no evidence of hemorrhage, infarction, edema, mass effect, extra-axial fluid collection, hydrocephalus or mass lesion. Vascular: No hyperdense vessel or unexpected calcification. Skull: Normal. Negative for fracture or focal lesion. Sinuses/Orbits: Mucosal thickening in the visualized right maxillary antrum. Other: None. IMPRESSION: Progressive atrophy and small vessel disease since the prior CT in 2015. No acute findings. Electronically Signed   By: Aletta Edouard M.D.   On: 03/30/2022 16:56   DG Chest Port 1 View  Result Date: 03/30/2022 CLINICAL DATA:  RIGHT knee pain, patient found down EXAM: PORTABLE CHEST 1 VIEW COMPARISON:  None Available. FINDINGS: Scoliosis of the spine. Normal cardiac silhouette. Lungs are clear. No fracture. No pneumothorax. IMPRESSION: No acute cardiopulmonary process. Electronically Signed   By: Suzy Bouchard M.D.   On: 03/30/2022 14:34   DG Pelvis Portable  Result Date: 03/30/2022 CLINICAL DATA:  Found on the ground, right femur pain EXAM: RIGHT FEMUR PORTABLE 1 VIEW; PORTABLE RIGHT KNEE - 1-2 VIEW; PORTABLE PELVIS 1-2 VIEWS COMPARISON:  None Available. FINDINGS: No acute hip fracture or dislocation. 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 Severe osteoarthritis of the patellofemoral compartment. No knee joint effusion. No aggressive osseous lesion. Normal alignment. Generalized osteopenia. Soft tissue are unremarkable. No radiopaque foreign body or soft tissue emphysema. IMPRESSION: 1.  No acute osseous injury of the right femur. 2.  No acute osseous injury of the right knee. 3.   No acute osseous injury of the pelvis. Electronically Signed   By: Kathreen Devoid M.D.   On: 03/30/2022 14:29   DG Knee Right Port  Result Date: 03/30/2022 CLINICAL DATA:  Found on the ground, right femur pain EXAM: RIGHT FEMUR PORTABLE 1 VIEW; PORTABLE RIGHT KNEE - 1-2 VIEW; PORTABLE PELVIS 1-2 VIEWS COMPARISON:  None Available. FINDINGS: No acute hip fracture or dislocation. 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 Severe osteoarthritis of the patellofemoral compartment. No knee joint effusion. No aggressive osseous lesion. Normal alignment. Generalized osteopenia. Soft tissue are unremarkable. No radiopaque foreign body or soft tissue emphysema. IMPRESSION: 1.  No acute osseous injury of the right femur. 2.  No acute osseous injury of the right knee. 3.  No acute osseous injury of the pelvis. Electronically Signed   By: Kathreen Devoid M.D.   On: 03/30/2022 14:29   DG FEMUR PORT, 1V RIGHT  Result Date: 03/30/2022 CLINICAL DATA:  Found on the ground, right femur pain EXAM: RIGHT FEMUR PORTABLE 1 VIEW; PORTABLE RIGHT KNEE - 1-2 VIEW; PORTABLE PELVIS 1-2 VIEWS COMPARISON:  None Available. FINDINGS: No acute hip fracture or dislocation. 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 Severe osteoarthritis of the patellofemoral compartment. No knee joint effusion. No aggressive osseous lesion. Normal  alignment. Generalized osteopenia. Soft tissue are unremarkable. No radiopaque foreign body or soft tissue emphysema. IMPRESSION: 1.  No acute osseous injury of the right femur. 2.  No acute osseous injury of the right knee. 3.  No acute osseous injury of the pelvis. Electronically Signed   By: Kathreen Devoid M.D.   On: 03/30/2022 14:29        Scheduled Meds:  apixaban  5 mg Oral BID   carbidopa-levodopa  1 tablet Oral QHS   carbidopa-levodopa  1.5 tablet Oral 5 times per day   latanoprost  1 drop Both Eyes QHS   propranolol ER  60 mg Oral Daily    Continuous Infusions:  dextrose 125 mL/hr at 03/31/22 1148     LOS: 0 days    Time spent: 35 minutes    Barb Merino, MD [facility] Pager [phone]

## 2022-03-31 NOTE — Progress Notes (Signed)
NEW ADMISSION NOTE New Admission Note:   Arrival Method: Stretcher from ED Mental Orientation: Alert and Oriented x4 Telemetry:  Tele Box #7 Assessment: Completed Skin: Stage 2 wound on right hip. Stage 1 wound on upper right back, and abrasions on right upper back. IV: Left A.C saline locked. Pain: 0/10. Tubes: None Safety Measures: Safety Fall Prevention Plan has been given, discussed and signed Admission: Initiated [facility] Orientation: Patient has been orientated to the room, unit and staff.  Family: Not present at the bedside.  Orders have been reviewed and implemented. Will continue to monitor the patient. Call light has been placed within reach and bed alarm has been activated.   Sharmon Revere, RN

## 2022-03-31 NOTE — Evaluation (Signed)
Physical Therapy Evaluation Patient Details Name: Sean Ward MRN: [id number] DOB: [birth year] Today's Date: 03/31/2022  History of Present Illness  81 y/o male presented to ED on 03/30/22 after being found down for unknown time (suspecting 3 days). Imaging negative for fractures. Admitted for rhabdomyolysis and dehydration. PMH: Parkinson's, Afib on Eliquis  Clinical Impression  Patient admitted with the above. PTA, patient was living alone and independent but unable to provide home setup as patient confused and easily distracted then perseverating on moving to [place] in September. Patient presents with impaired balance, weakness (R>L - may be due to pain versus true weakness), decreased activity tolerance, pain, and impaired cognition. Patient required max encouragement to participate with therapy this date due to pain. Patient requires maxA+2 for bed mobility and totalA+2 to attempt lateral scoot transfer. Patient declined standing this date after multiple attempts at encouraging patient. Patient will benefit from skilled PT services during acute stay to address listed deficits. Recommend SNF at this time to maximize functional independence and safety.        Recommendations for follow up therapy are one component of a multi-disciplinary discharge planning process, led by the attending physician.  Recommendations may be updated based on patient status, additional functional criteria and insurance authorization.  Follow Up Recommendations Skilled nursing-short term rehab (<3 hours/day)    Assistance Recommended at Discharge Frequent or constant Supervision/Assistance  Patient can return home with the following  Two people to help with walking and/or transfers;Two people to help with bathing/dressing/bathroom;Assistance with cooking/housework;Direct supervision/assist for medications management;Direct supervision/assist for financial management;Assist for transportation;Help with stairs or ramp for  entrance    Equipment Recommendations Other (comment) (TBD)  Recommendations for Other Services  OT consult    Functional Status Assessment Patient has had a recent decline in their functional status and demonstrates the ability to make significant improvements in function in a reasonable and predictable amount of time.     Precautions / Restrictions Precautions Precautions: Fall Restrictions Weight Bearing Restrictions: No      Mobility  Bed Mobility Overal bed mobility: Needs Assistance Bed Mobility: Supine to Sit, Sit to Supine     Supine to sit: Max assist, +2 for physical assistance, +2 for safety/equipment, HOB elevated Sit to supine: Max assist, +2 for physical assistance, +2 for safety/equipment   General bed mobility comments: able to initiate reaching across with R hand to bed rail with hand over hand guidance. Overall maxA+2 for all aspects of bed mobility    Transfers Overall transfer level: Needs assistance   Transfers: Bed to chair/wheelchair/BSC            Lateral/Scoot Transfers: Total assist, +2 physical assistance, +2 safety/equipment General transfer comment: attempted to have patient lateral scoot towards [facility] but patient not initiating movement requiring totalA+2 to scoot towards L side. Patient declined standing x4 attempts to encourage mobility    Ambulation/Gait                  Stairs            Wheelchair Mobility    Modified Rankin (Stroke Patients Only)       Balance Overall balance assessment: Needs assistance Sitting-balance support: Bilateral upper extremity supported, Feet supported Sitting balance-Leahy Scale: Fair                                       Pertinent Vitals/Pain Pain Assessment Pain  Assessment: Faces Faces Pain Scale: Hurts even more Pain Location: R side of entire body Pain Descriptors / Indicators: Aching, Discomfort, Grimacing, Sore Pain Intervention(s): Monitored during  session, Repositioned    Home Living Family/patient expects to be discharged to:: Private residence Living Arrangements: Alone Available Help at Discharge: Friend(s);Available PRN/intermittently Type of Home: House             Additional Comments: Difficult to obtain full hx as patient is confused and quickly distracted in conversation about moving in September back to [place]. Difficult to regain attention back to questions    Prior Function Prior Level of Function : Independent/Modified Independent                     Hand Dominance        Extremity/Trunk Assessment   Upper Extremity Assessment Upper Extremity Assessment: Generalized weakness    Lower Extremity Assessment Lower Extremity Assessment: Generalized weakness (R>L but could be due to pain vs true weakness)    Cervical / Trunk Assessment Cervical / Trunk Assessment: Kyphotic  Communication   Communication: No difficulties  Cognition Arousal/Alertness: Awake/alert Behavior During Therapy: WFL for tasks assessed/performed Overall Cognitive Status: Impaired/Different from baseline Area of Impairment: Attention, Memory, Safety/judgement, Awareness, Problem solving                   Current Attention Level: Sustained Memory: Decreased short-term memory   Safety/Judgement: Decreased awareness of safety, Decreased awareness of deficits Awareness: Emergent Problem Solving: Slow processing, Difficulty sequencing, Decreased initiation, Requires verbal cues, Requires tactile cues General Comments: A&Ox4. He does remember that he fell but does not recall how he fell besides R knee pain. STM deficits noted with forgetting task. Cues for sequencing for mobility tasks        General Comments      Exercises     Assessment/Plan    PT Assessment Patient needs continued PT services  PT Problem List Decreased strength;Decreased activity tolerance;Decreased balance;Decreased mobility;Decreased  cognition;Decreased knowledge of use of DME;Decreased safety awareness;Decreased knowledge of precautions;Pain       PT Treatment Interventions DME instruction;Gait training;Functional mobility training;Therapeutic exercise;Therapeutic activities;Stair training;Balance training;Patient/family education    PT Goals (Current goals can be found in the Care Plan section)  Acute Rehab PT Goals Patient Stated Goal: did not state PT Goal Formulation: Patient unable to participate in goal setting Time For Goal Achievement: 04/14/22 Potential to Achieve Goals: Fair    Frequency Min 2X/week     Co-evaluation               AM-PAC PT "6 Clicks" Mobility  Outcome Measure Help needed turning from your back to your side while in a flat bed without using bedrails?: Total Help needed moving from lying on your back to sitting on the side of a flat bed without using bedrails?: Total Help needed moving to and from a bed to a chair (including a wheelchair)?: Total Help needed standing up from a chair using your arms (e.g., wheelchair or bedside chair)?: Total Help needed to walk in hospital room?: Total Help needed climbing 3-5 steps with a railing? : Total 6 Click Score: 6    End of Session   Activity Tolerance: Patient tolerated treatment well Patient left: in bed;with call bell/phone within reach;with bed alarm set Nurse Communication: Mobility status PT Visit Diagnosis: Unsteadiness on feet (R26.81);Muscle weakness (generalized) (M62.81);Difficulty in walking, not elsewhere classified (R26.2);Other abnormalities of gait and mobility (R26.89);History of falling (Z91.81)  Time: 1042-1100 PT Time Calculation (min) (ACUTE ONLY): 18 min   Charges:   PT Evaluation $PT Eval Moderate Complexity: 1 Mod          Amandalynn Pitz A. Gilford Rile PT, DPT Acute Rehabilitation Services Office [phone]   Linna Hoff 03/31/2022, 1:04 PM

## 2022-03-31 NOTE — Consult Note (Signed)
WOC Nurse Consult Note: Reason for Consult:Stage 2 PI to right hip, Stage 1 PI to sacrum, POA.  Wound type: Pressure vs trauma Pressure Injury POA: Yes Measurement:Per Nursing Flow Sheet Sacrum: Stage 1, 1cm x 1cm Right hip: Stage 2, partial thickness: 6cm x 5cm x 0.1cm Wound [place]:[id number] hip, red, moist Drainage (amount, consistency, odor), small serous Periwound: intact, ecchymosis Dressing procedure/placement/frequency: I have provided Nursing with guidance for the care of the two lesions noted on admission using a silicone foam to the sacrum and a silicone foam to the right hip after first placing an antimicrobial, nonadherent (xeroform) to the wound.  Turning and repositioning is in place; I have added guidance to minimize time in the supine position. Heel are to be floated; I have added bilateral pressure redistribution heel boots. A pressure redistribution chair cushion is provided for use while in house and OOB in the chair, this is to be sent home (or to the place of disposition) with the patient upon discharge for PI prevention and comfort.  [facility] nursing team will not follow, but will remain available to this patient, the nursing and medical teams.  Please re-consult if needed.  Thank you for inviting [place] to participate in this patient's Plan of Care.  Maudie Flakes, MSN, RN, CNS, [place], Serita Grammes, [employer], [employer] phone:  [phone]

## 2022-04-01 DIAGNOSIS — L8990 Pressure ulcer of unspecified site, unspecified stage: Secondary | ICD-10-CM | POA: Diagnosis present

## 2022-04-01 DIAGNOSIS — M6282 Rhabdomyolysis: Secondary | ICD-10-CM | POA: Diagnosis not present

## 2022-04-01 LAB — BASIC METABOLIC PANEL
Anion gap: 8 (ref 5–15)
BUN: 27 mg/dL — ABNORMAL HIGH (ref 8–23)
CO2: 25 mmol/L (ref 22–32)
Calcium: 8.1 mg/dL — ABNORMAL LOW (ref 8.9–10.3)
Chloride: 103 mmol/L (ref 98–111)
Creatinine, Ser: 1.09 mg/dL (ref 0.61–1.24)
GFR, Estimated: >60 mL/min (ref >60)
Glucose, Bld: 97 mg/dL (ref 70–99)
Potassium: 3.4 mmol/L — ABNORMAL LOW (ref 3.5–5.1)
Sodium: 136 mmol/L (ref 135–145)

## 2022-04-01 LAB — CBC WITH DIFFERENTIAL/PLATELET
Abs Immature Granulocytes: 0.08 10*3/uL — ABNORMAL HIGH (ref 0.00–0.07)
Basophils Absolute: 0 10*3/uL (ref 0.0–0.1)
Basophils Relative: 0 %
Eosinophils Absolute: 0.2 10*3/uL (ref 0.0–0.5)
Eosinophils Relative: 2 %
HCT: 39.3 % (ref 39.0–52.0)
Hemoglobin: 13.6 g/dL (ref 13.0–17.0)
Immature Granulocytes: 1 %
Lymphocytes Relative: 19 %
Lymphs Abs: 1.4 10*3/uL (ref 0.7–4.0)
MCH: 31.1 pg (ref 26.0–34.0)
MCHC: 34.6 g/dL (ref 30.0–36.0)
MCV: 89.7 fL (ref 80.0–100.0)
Monocytes Absolute: 1 10*3/uL (ref 0.1–1.0)
Monocytes Relative: 13 %
Neutro Abs: 4.8 10*3/uL (ref 1.7–7.7)
Neutrophils Relative %: 65 %
Platelets: 203 10*3/uL (ref 150–400)
RBC: 4.38 MIL/uL (ref 4.22–5.81)
RDW: 15.1 % (ref 11.5–15.5)
WBC: 7.5 10*3/uL (ref 4.0–10.5)
nRBC: 0 % (ref 0.0–0.2)

## 2022-04-01 LAB — MAGNESIUM: Magnesium: 1.9 mg/dL (ref 1.7–2.4)

## 2022-04-01 LAB — CK: Total CK: 955 U/L — ABNORMAL HIGH (ref 49–397)

## 2022-04-01 LAB — PHOSPHORUS: Phosphorus: 2.3 mg/dL — ABNORMAL LOW (ref 2.5–4.6)

## 2022-04-01 MED ORDER — POTASSIUM & SODIUM PHOSPHATES 280-160-250 MG PO PACK
1.0000 | PACK | Freq: Three times a day (TID) | ORAL | Status: DC
Start: 2022-04-01 — End: 2022-04-03
  Administered 2022-04-01 – 2022-04-03 (×9): 1 via ORAL
  Filled 2022-04-01 (×12): qty 1

## 2022-04-01 NOTE — TOC Initial Note (Addendum)
Transition of Care [facility]) - Initial/Assessment Note    Patient Details  Name: Sean Ward MRN: [id number] Date of Birth: [birth year]  [facility] CM/SW Contact:    Baldemar Lenis, LCSW Phone Number: 04/01/2022, 11:13 AM  Clinical Narrative:       CSW confirmed with patient that he is agreeable to SNF. CSW attempted to discuss CMS choice list, but patient had been taking a nap and requested that CSW come back later with more information. CSW faxed out referral for SNF, will follow.             Expected Discharge Plan: Skilled Nursing Facility Barriers to Discharge: Continued Medical Work up, English as a second language teacher, Awaiting state approval (PASRR)   Patient Goals and CMS Choice Patient states their goals for this hospitalization and ongoing recovery are:: to get better CMS Medicare.gov Compare Post Acute Care list provided to:: Patient Choice offered to / list presented to : Patient  Expected Discharge Plan and Services Expected Discharge Plan: Skilled Nursing Facility     Post Acute Care Choice: Skilled Nursing Facility Living arrangements for the past 2 months: Apartment                                      Prior Living Arrangements/Services Living arrangements for the past 2 months: Apartment Lives with:: Self Patient language and need for interpreter reviewed:: No Do you feel safe going back to the place where you live?: Yes      Need for Family Participation in Patient Care: No (Comment) Care giver support system in place?: No (comment)   Criminal Activity/Legal Involvement Pertinent to Current Situation/Hospitalization: No - Comment as needed  Activities of Daily Living Home Assistive Devices/Equipment: Cane (specify quad or straight) ADL Screening (condition at time of admission) Patient's cognitive ability adequate to safely complete daily activities?: Yes Is the patient deaf or have difficulty hearing?: No Does the patient have  difficulty seeing, even when wearing glasses/contacts?: No Does the patient have difficulty concentrating, remembering, or making decisions?: No Patient able to express need for assistance with ADLs?: Yes Does the patient have difficulty dressing or bathing?: Yes Independently performs ADLs?: No Communication: Independent Dressing (OT): Needs assistance Is this a change from baseline?: Change from baseline, expected to last >3 days Grooming: Appropriate for developmental age Feeding: Needs assistance Is this a change from baseline?: Change from baseline, expected to last >3 days Bathing: Needs assistance Is this a change from baseline?: Change from baseline, expected to last >3 days Toileting: Needs assistance Is this a change from baseline?: Change from baseline, expected to last >3days In/Out Bed: Needs assistance Is this a change from baseline?: Change from baseline, expected to last >3 days Walks in Home: Independent with device (comment) Does the patient have difficulty walking or climbing stairs?: Yes Weakness of Legs: Both Weakness of Arms/Hands: None  Permission Sought/Granted Permission sought to share information with : Oceanographer granted to share information with : Yes, Verbal Permission Granted     Permission granted to share info w AGENCY: SNF        Emotional Assessment Appearance:: Appears stated age Attitude/Demeanor/Rapport: Lethargic Affect (typically observed): Appropriate Orientation: : Oriented to Self, Oriented to Place, Oriented to  Time, Oriented to Situation Alcohol / Substance Use: Not Applicable Psych Involvement: No (comment)  Admission diagnosis:  Rhabdomyolysis [M62.82] Patient Active Problem List   Diagnosis Date Noted  Pressure injury of skin 04/01/2022   Rhabdomyolysis 03/30/2022   Dehydration 03/30/2022   Psoriasis 11/13/2021   Dermatochalasis of both upper eyelids 08/04/2021   Neck pain 05/10/2021    Other idiopathic scoliosis, 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, [address] [place] [address] Phone: [phone] Fax: [phone]     Social Determinants of Health (SDOH) Interventions    Readmission Risk Interventions     No data to display

## 2022-04-01 NOTE — Progress Notes (Signed)
PROGRESS NOTE    Sean Ward  [id number] DOB: 1941/06/21 DOA: 03/30/2022 PCP: Elsie Stain, MD    Brief Narrative:  81 year old with history of Parkinson's disease, paroxysmal A-fib on Eliquis, severe right knee arthritis fell on the floor of his apartment and could not get up for 4 days, kept yelling out for help and finally someone heard him after 4 days and called EMS and brought to ER.  Denied any trauma but he had some bruise on the right hip area. In the emergency room hemodynamically stable.  Sodium 147.  Creatinine kinase 2700.  A skeletal survey negative.  Treated in the hospital.  Needs SNF placement that is pending.   Assessment & Plan:   Traumatic rhabdomyolysis, fall, ambulatory dysfunction due to severe right knee pain, moderate dehydration and hyponatremia: -Treated with IV fluids with clinical improvement.  Discontinue IV fluids and encourage oral intake. -PT OT, fall precautions, refer for rehab. -Unable to get up because of right knee arthritis, if he does go to rehab, he may be able to get right knee arthroplasty and improve his mobility.  Paroxysmal A-fib: No evidence of injury or bleeding.  Rate controlled in sinus rhythm on propanolol.  Continue Eliquis.  Parkinson's disease: Fairly controlled on Sinemet.  Continue.  Hypokalemia/hypophosphatemia: Replace by mouth.   DVT prophylaxis:  apixaban (ELIQUIS) tablet 5 mg   Code Status: Full code Family Communication: None.  Patient does not have any family members in town.  He has children live in [place] but they are estranged as per the patient. Disposition Plan: Status is: Inpatient.  Remains inpatient.  Unsafe discharge planning. Medically stable to transfer to skilled nursing rehab.   Consultants:  None  Procedures:  None  Antimicrobials:  None   Subjective:  Patient seen and examined.  More awake and interactive today.  He tells me that he cannot bear weight on his right knee and now  leaning towards surgery. We discussed that patient should be able to get around with a walker, get some physical therapy and strengthening before he should go for surgery.  He is agreeable.  Objective: Vitals:   03/31/22 0904 03/31/22 2028 04/01/22 0448 04/01/22 0902  BP: 97/68 106/69 94/65 96/64   Pulse: 90 73 (!) 54 68  Resp: 18 18 17 18   Temp: 97.8 F (36.6 C) 97.9 F (36.6 C) 98 F (36.7 C) 98 F (36.7 C)  TempSrc: Oral   Oral  SpO2: 100% 97% 99% 99%  Weight:      Height:        Intake/Output Summary (Last 24 hours) at 04/01/2022 1037 Last data filed at 04/01/2022 0800 Gross per 24 hour  Intake 2116.86 ml  Output 500 ml  Net 1616.86 ml   Filed Weights   03/30/22 2128  Weight: 74.2 kg    Examination:  General exam: Appears calm and comfortable at rest.  In normal mood today. Respiratory system: Clear to auscultation. Respiratory effort normal.  No added sounds. Cardiovascular system: S1 & S2 heard, RRR.  Gastrointestinal system: Abdomen is nondistended, soft and nontender. No organomegaly or masses felt. Normal bowel sounds heard. Central nervous system: Alert and oriented. No focal neurological deficits. Extremities: Symmetric 5 x 5 power. Right knee with chronic deformity and stiffness.    Data Reviewed: I have personally reviewed following labs and imaging studies  CBC: Recent Labs  Lab 03/30/22 1425 03/30/22 1457 03/30/22 2253 03/31/22 0216 04/01/22 0444  WBC 12.2*  --  10.5 10.4 7.5  NEUTROABS  --   --   --   --  4.8  HGB 16.6 16.3 14.6 14.3 13.6  HCT 48.0 48.0 42.6 42.1 39.3  MCV 90.1  --  90.3 90.3 89.7  PLT 251  --  227 219 [id number]   Basic Metabolic Panel: Recent Labs  Lab 03/30/22 1425 03/30/22 1457 03/31/22 0216 04/01/22 0444  NA 147* 148* 143 136  K 3.6 3.5 3.9 3.4*  CL 113* 114* 113* 103  CO2 21*  --  22 25  GLUCOSE 125* 119* 135* 97  BUN 50* 46* 39* 27*  CREATININE 1.17 1.00 1.14 1.09  CALCIUM 8.8*  --  8.3* 8.1*  MG  --   --   --   1.9  PHOS  --   --   --  2.3*   GFR: Estimated Creatinine Clearance: 55 mL/min (by C-G formula based on SCr of 1.09 mg/dL). Liver Function Tests: Recent Labs  Lab 03/30/22 1425 03/31/22 0216  AST 95* 87*  ALT 68* 13  ALKPHOS 111 95  BILITOT 2.9* 2.7*  PROT 6.0* 5.0*  ALBUMIN 2.8* 2.4*   No results for input(s): "LIPASE", "AMYLASE" in the last 168 hours. Recent Labs  Lab 03/30/22 1425  AMMONIA <10   Coagulation Profile: Recent Labs  Lab 03/30/22 1425  INR 1.4*   Cardiac Enzymes: Recent Labs  Lab 03/30/22 1425 03/31/22 0216 04/01/22 0444  CKTOTAL 2,776* 2,092* 955*   BNP (last 3 results) No results for input(s): "PROBNP" in the last 8760 hours. HbA1C: No results for input(s): "HGBA1C" in the last 72 hours. CBG: No results for input(s): "GLUCAP" in the last 168 hours. Lipid Profile: No results for input(s): "CHOL", "HDL", "LDLCALC", "TRIG", "CHOLHDL", "LDLDIRECT" in the last 72 hours. Thyroid Function Tests: No results for input(s): "TSH", "T4TOTAL", "FREET4", "T3FREE", "THYROIDAB" in the last 72 hours. Anemia Panel: No results for input(s): "VITAMINB12", "FOLATE", "FERRITIN", "TIBC", "IRON", "RETICCTPCT" in the last 72 hours. Sepsis Labs: Recent Labs  Lab 03/30/22 1425  LATICACIDVEN 2.2*    Recent Results (from the past 240 hour(s))  Resp Panel by RT-PCR (Flu A&B, Covid) Anterior Nasal Swab     Status: None   Collection Time: 03/30/22  2:25 PM   Specimen: Anterior Nasal Swab  Result Value Ref Range Status   SARS Coronavirus 2 by RT PCR NEGATIVE NEGATIVE Final    Comment: (NOTE) SARS-CoV-2 target nucleic acids are NOT DETECTED.  The SARS-CoV-2 RNA is generally detectable in upper respiratory specimens during the acute phase of infection. The lowest concentration of SARS-CoV-2 viral copies this assay can detect is 138 copies/mL. A negative result does not preclude SARS-Cov-2 infection and should not be used as the sole basis for treatment or other  patient management decisions. A negative result may occur with  improper specimen collection/handling, submission of specimen other than nasopharyngeal swab, presence of viral mutation(s) within the areas targeted by this assay, and inadequate number of viral copies(<138 copies/mL). A negative result must be combined with clinical observations, patient history, and epidemiological information. The expected result is Negative.  Fact Sheet for Patients:  EntrepreneurPulse.com.au  Fact Sheet for Healthcare Providers:  IncredibleEmployment.be  This test is no t yet approved or cleared by the [place] FDA and  has been authorized for detection and/or diagnosis of SARS-CoV-2 by FDA under an Emergency Use Authorization (EUA). This EUA will remain  in effect (meaning this test can be used) for the duration of the COVID-19 declaration under Section 564(b)(1) of the Act, 21 U.S.C.section 360bbb-3(b)(1), unless the authorization is terminated  or revoked sooner.  Influenza A by PCR NEGATIVE NEGATIVE Final   Influenza B by PCR NEGATIVE NEGATIVE Final    Comment: (NOTE) The Xpert Xpress SARS-CoV-2/FLU/RSV plus assay is intended as an aid in the diagnosis of influenza from Nasopharyngeal swab specimens and should not be used as a sole basis for treatment. Nasal washings and aspirates are unacceptable for Xpert Xpress SARS-CoV-2/FLU/RSV testing.  Fact Sheet for Patients: EntrepreneurPulse.com.au  Fact Sheet for Healthcare Providers: IncredibleEmployment.be  This test is not yet approved or cleared by the [place] FDA and has been authorized for detection and/or diagnosis of SARS-CoV-2 by FDA under an Emergency Use Authorization (EUA). This EUA will remain in effect (meaning this test can be used) for the duration of the COVID-19 declaration under Section 564(b)(1) of the Act, 21 U.S.C. section  360bbb-3(b)(1), unless the authorization is terminated or revoked.  Performed at [facility], [place] [address], [place], [place] [address]          Radiology Studies: CT CERVICAL SPINE WO CONTRAST  Result Date: 03/30/2022 CLINICAL DATA:  Fall and found down at home. EXAM: CT CERVICAL SPINE WITHOUT CONTRAST TECHNIQUE: Multidetector CT imaging of the cervical spine was performed without intravenous contrast. Multiplanar CT image reconstructions were also generated. RADIATION DOSE REDUCTION: This exam was performed according to the departmental dose-optimization program which includes automated exposure control, adjustment of the mA and/or kV according to patient size and/or use of iterative reconstruction technique. COMPARISON:  01/21/2014 FINDINGS: Alignment: No subluxation. Skull base and vertebrae: No acute fracture identified. Bilateral facet hypertrophy. Soft tissues and spinal canal: No prevertebral fluid or swelling. No visible canal hematoma. Disc levels: Progressive moderate degenerative disc disease of the cervical spine most significantly at the C4-5, C5-6 and C6-7 levels since the prior CT in 2015. Upper chest: Negative. IMPRESSION: No acute fracture identified. 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 Electronically Signed   By: Aletta Edouard M.D.   On: 03/30/2022 17:06   CT CHEST ABDOMEN PELVIS W CONTRAST  Result Date: 03/30/2022 CLINICAL DATA:  Trauma.  Fall. EXAM: CT CHEST, ABDOMEN, AND PELVIS WITH CONTRAST TECHNIQUE: Multidetector CT imaging of the chest, abdomen and pelvis was performed following the standard protocol during bolus administration of intravenous contrast. RADIATION DOSE REDUCTION: This exam was performed according to the departmental dose-optimization program which includes automated exposure control, adjustment of the mA and/or kV according to patient size and/or use of iterative reconstruction  technique. CONTRAST:  175mL OMNIPAQUE IOHEXOL 300 MG/ML  SOLN COMPARISON:  None Available. FINDINGS: CT CHEST FINDINGS Cardiovascular: Heart is mildly enlarged. Aorta is normal in size. There are atherosclerotic calcifications of the aorta. There is no pericardial effusion. Mediastinum/Nodes: No enlarged mediastinal, hilar, or axillary lymph nodes. Thyroid gland, trachea, and esophagus demonstrate no significant findings. Lungs/Pleura: There is scarring in the lung apices. There is minimal dependent atelectasis bilaterally. No pleural effusion or pneumothorax. Musculoskeletal: No fractures are seen. CT ABDOMEN PELVIS FINDINGS Hepatobiliary: Gallstones are present. Liver and bile ducts are within normal limits. Pancreas: Unremarkable. No pancreatic ductal dilatation or surrounding inflammatory changes. Spleen: Normal in size without focal abnormality. Adrenals/Urinary Tract: There is a 2.7 cm cyst in the superior pole the right kidney. Otherwise, of the adrenal adrenal glands and kidneys are within normal limits. Tiny layering calculi are seen in the bladder. Stomach/Bowel: There is some rectal wall thickening. No dilated bowel loops. No bowel obstruction, pneumatosis, focal inflammation or free air. Stomach is within normal limits.  Vascular/Lymphatic: Aortic atherosclerosis. No enlarged abdominal or pelvic lymph nodes. Reproductive: Prostate gland is enlarged measuring 4.3 x 5.4 cm. Other: There is a large left inguinal hernia containing nondilated bowel and mesenteric fat. There is no ascites. Musculoskeletal: No acute fractures are seen. Degenerative changes affect the spine. IMPRESSION: 1. No acute posttraumatic sequelae in the chest, abdomen or pelvis. 2. Rectal wall thickening concerning for proctitis. Recommend clinical correlation and follow-up to exclude underlying lesion. 3. Large left inguinal hernia containing bowel. No bowel obstruction. 4. Small bladder calculi. 5. Cholelithiasis. 6. Prostatomegaly. 7.   Aortic Atherosclerosis (ICD10-I70.0). Electronically Signed   By: Ronney Asters M.D.   On: 03/30/2022 16:58   CT HEAD WO CONTRAST  Result Date: 03/30/2022 CLINICAL DATA:  Fall and found down at home. EXAM: CT HEAD WITHOUT CONTRAST TECHNIQUE: Contiguous axial images were obtained from the base of the skull through the vertex without intravenous contrast. RADIATION DOSE REDUCTION: This exam was performed according to the departmental dose-optimization program which includes automated exposure control, adjustment of the mA and/or kV according to patient size and/or use of iterative reconstruction technique. COMPARISON:  Prior CT of the head on 01/21/2014 FINDINGS: Brain: There is some progression of diffuse cortical atrophy since 2015. Some progressive mild periventricular small vessel disease also present. The brain demonstrates no evidence of hemorrhage, infarction, edema, mass effect, extra-axial fluid collection, hydrocephalus or mass lesion. Vascular: No hyperdense vessel or unexpected calcification. Skull: Normal. Negative for fracture or focal lesion. Sinuses/Orbits: Mucosal thickening in the visualized right maxillary antrum. Other: None. IMPRESSION: Progressive atrophy and small vessel disease since the prior CT in 2015. No acute findings. Electronically Signed   By: Aletta Edouard M.D.   On: 03/30/2022 16:56   DG Chest Port 1 View  Result Date: 03/30/2022 CLINICAL DATA:  RIGHT knee pain, patient found down EXAM: PORTABLE CHEST 1 VIEW COMPARISON:  None Available. FINDINGS: Scoliosis of the spine. Normal cardiac silhouette. Lungs are clear. No fracture. No pneumothorax. IMPRESSION: No acute cardiopulmonary process. Electronically Signed   By: Suzy Bouchard M.D.   On: 03/30/2022 14:34   DG Pelvis Portable  Result Date: 03/30/2022 CLINICAL DATA:  Found on the ground, right femur pain EXAM: RIGHT FEMUR PORTABLE 1 VIEW; PORTABLE RIGHT KNEE - 1-2 VIEW; PORTABLE PELVIS 1-2 VIEWS COMPARISON:  None  Available. FINDINGS: No acute hip fracture or dislocation. 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 Severe osteoarthritis of the patellofemoral compartment. No knee joint effusion. No aggressive osseous lesion. Normal alignment. Generalized osteopenia. Soft tissue are unremarkable. No radiopaque foreign body or soft tissue emphysema. IMPRESSION: 1.  No acute osseous injury of the right femur. 2.  No acute osseous injury of the right knee. 3.  No acute osseous injury of the pelvis. Electronically Signed   By: Kathreen Devoid M.D.   On: 03/30/2022 14:29   DG Knee Right Port  Result Date: 03/30/2022 CLINICAL DATA:  Found on the ground, right femur pain EXAM: RIGHT FEMUR PORTABLE 1 VIEW; PORTABLE RIGHT KNEE - 1-2 VIEW; PORTABLE PELVIS 1-2 VIEWS COMPARISON:  None Available. FINDINGS: No acute hip fracture or dislocation. 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 Severe osteoarthritis of the patellofemoral compartment. No knee joint effusion. No aggressive osseous lesion. Normal alignment. Generalized osteopenia. Soft tissue are unremarkable. No radiopaque foreign body or soft tissue emphysema. IMPRESSION: 1.  No acute osseous injury of the right femur. 2.  No acute osseous injury of the right knee. 3.  No acute osseous  injury of the pelvis. Electronically Signed   By: Kathreen Devoid M.D.   On: 03/30/2022 14:29   DG FEMUR PORT, 1V RIGHT  Result Date: 03/30/2022 CLINICAL DATA:  Found on the ground, right femur pain EXAM: RIGHT FEMUR PORTABLE 1 VIEW; PORTABLE RIGHT KNEE - 1-2 VIEW; PORTABLE PELVIS 1-2 VIEWS COMPARISON:  None Available. FINDINGS: No acute hip fracture or dislocation. 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 Severe osteoarthritis of the patellofemoral compartment. No knee joint effusion. No aggressive osseous lesion. Normal alignment. Generalized osteopenia. Soft tissue are unremarkable. No radiopaque foreign body  or soft tissue emphysema. IMPRESSION: 1.  No acute osseous injury of the right femur. 2.  No acute osseous injury of the right knee. 3.  No acute osseous injury of the pelvis. Electronically Signed   By: Kathreen Devoid M.D.   On: 03/30/2022 14:29        Scheduled Meds:  apixaban  5 mg Oral BID   carbidopa-levodopa  1 tablet Oral QHS   carbidopa-levodopa  1.5 tablet Oral 5 times per day   latanoprost  1 drop Both Eyes QHS   potassium & sodium phosphates  1 packet Oral TID WC & HS   propranolol ER  60 mg Oral Daily   Continuous Infusions:     LOS: 1 day    Time spent: 35 minutes    Barb Merino, MD [facility] Pager [phone]

## 2022-04-01 NOTE — NC FL2 (Signed)
[place] LEVEL OF CARE SCREENING TOOL     IDENTIFICATION  Patient Name: Sean Ward Birthdate: 06/11/1941 Sex: male Admission Date (Current Location): 03/30/2022  [facility] [place] Number:  Herbalist and Address:  [facility], [place] [address], [place],  [address]      Provider Number: [id number]  Attending Physician Name and Address:  Barb Merino, MD  Relative Name and Phone Number:       Current Level of Care: Hospital Recommended Level of Care: [place] Prior Approval Number:    Date Approved/Denied:   PASRR Number: Manual review  Discharge Plan: SNF    Current Diagnoses: Patient Active Problem List   Diagnosis Date Noted   Pressure injury of skin 04/01/2022   Rhabdomyolysis 03/30/2022   Dehydration 03/30/2022   Psoriasis 11/13/2021   Dermatochalasis of both upper eyelids 08/04/2021   Neck pain 05/10/2021   Other idiopathic scoliosis, 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   Lumbar back pain 05/10/2021   Parkinson disease ([place]) 02/28/2018   Primary osteoarthritis of right knee 08/16/2016   Dyslipidemia 08/16/2016   Unsteady gait 05/24/2016   Atrial fibrillation ([place]) 10/27/2014   Onychomycosis 05/03/2014   Tremor 04/08/2014   Venous stasis 03/25/2014    Orientation RESPIRATION BLADDER Height & Weight     Self, Time, Situation, Place  Normal Continent Weight: 163 lb 9.3 oz (74.2 kg) Height:  5\' 9"  (175.3 cm)  BEHAVIORAL SYMPTOMS/MOOD NEUROLOGICAL BOWEL NUTRITION STATUS      Continent Diet (heart healthy)  AMBULATORY STATUS COMMUNICATION OF NEEDS Skin   Extensive Assist Verbally PU Stage and Appropriate Care PU Stage 1 Dressing:  (lumbar lateral, foam dressing: lift every shift to assess, change PRN) PU Stage 2 Dressing: Daily (right hip, foam dressing: lift every shift to assess, change daily;)                   Personal Care Assistance Level of Assistance  Bathing, Feeding, Dressing  Bathing Assistance: Maximum assistance Feeding assistance: Limited assistance Dressing Assistance: Maximum assistance     Functional Limitations Info             SPECIAL CARE FACTORS FREQUENCY  PT (By licensed PT), OT (By licensed OT)     PT Frequency: 5x/wk OT Frequency: 5x/wk            Contractures Contractures Info: Not present    Additional Factors Info  Code Status, Allergies Code Status Info: Full Allergies Info: NKA           Current Medications (04/01/2022):  This is the current hospital active medication list Current Facility-Administered Medications  Medication Dose Route Frequency Provider Last Rate Last Admin   acetaminophen (TYLENOL) tablet 650 mg  650 mg Oral Q6H PRN Rise Patience, MD       Or   acetaminophen (TYLENOL) suppository 650 mg  650 mg Rectal Q6H PRN Rise Patience, MD       apixaban Arne Cleveland) tablet 5 mg  5 mg Oral BID Rise Patience, MD   5 mg at 03/31/22 2214   carbidopa-levodopa (SINEMET CR) 50-200 MG per tablet controlled release 1 tablet  1 tablet Oral QHS Rise Patience, MD   1 tablet at 03/31/22 2214   carbidopa-levodopa (SINEMET IR) 25-100 MG per tablet immediate release 1.5 tablet  1.5 tablet Oral 5 times per day Rise Patience, MD   1.5 tablet at 04/01/22 0813   latanoprost (XALATAN) 0.005 %  ophthalmic solution 1 drop  1 drop Both Eyes QHS Rise Patience, MD   1 drop at 03/31/22 2215   potassium & sodium phosphates (PHOS-NAK) 280-160-250 MG packet 1 packet  1 packet Oral TID WC & HS Barb Merino, MD   1 packet at 04/01/22 0813   propranolol ER (INDERAL LA) 24 hr capsule 60 mg  60 mg Oral Daily Rise Patience, MD   60 mg at 03/31/22 1116     Discharge Medications: Please see discharge summary for a list of discharge medications.  Relevant Imaging Results:  Relevant Lab Results:   Additional Information SS#: [id number]  Geralynn Ochs, LCSW

## 2022-04-01 NOTE — Progress Notes (Signed)
Name: Sean Ward DOB: [birth year]  Please be advised that the above-named patient will require a short-term nursing home stay -- anticipated 30 days or less for rehabilitation and strengthening. The plan is for return home.       Please be advised that the above-named patient has a primary diagnosis of dementia which supersedes any psychiatric diagnosis.

## 2022-04-02 ENCOUNTER — Ambulatory Visit: Payer: Medicare Other | Admitting: Orthopedic Surgery

## 2022-04-02 DIAGNOSIS — G20 Parkinson's disease: Secondary | ICD-10-CM

## 2022-04-02 DIAGNOSIS — I480 Paroxysmal atrial fibrillation: Secondary | ICD-10-CM

## 2022-04-02 DIAGNOSIS — L89132 Pressure ulcer of right lower back, stage 2: Secondary | ICD-10-CM

## 2022-04-02 LAB — CK: Total CK: 600 U/L — ABNORMAL HIGH (ref 49–397)

## 2022-04-02 NOTE — TOC Progression Note (Signed)
Transition of Care [facility]) - Initial/Assessment Note    Patient Details  Name: Sean Ward MRN: [id number] Date of Birth: 1940/11/26  Transition [facility]) CM/SW Contact:    Milinda Antis, [place] Phone Number: 04/02/2022, 11:20 AM  Clinical Narrative:                 CSW met with patient at bedside to present bed offers.  Patient choose [facility].  CSW explained the insurance authorization process and informed the patient that insurance auth would be requested today.  CSW received PASRR number. ([id number])  CSW requested that TOC SWA begin insurance auth.    Expected Discharge Plan: Skilled Nursing Facility Barriers to Discharge: Continued Medical Work up, Ship broker, [place] (PASRR)   Patient Goals and CMS Choice Patient states their goals for this hospitalization and ongoing recovery are:: to get better CMS Medicare.gov Compare Post Acute Care list provided to:: Patient Choice offered to / list presented to : Patient  Expected Discharge Plan and Services Expected Discharge Plan: [place] Choice: [place] Living arrangements for the past 2 months: Apartment                                      Prior Living Arrangements/Services Living arrangements for the past 2 months: Apartment Lives with:: Self Patient language and need for interpreter reviewed:: No Do you feel safe going back to the place where you live?: Yes      Need for Family Participation in Patient Care: No (Comment) Care giver support system in place?: No (comment)   Criminal Activity/Legal Involvement Pertinent to Current Situation/Hospitalization: No - Comment as needed  Activities of Daily Living Home Assistive Devices/Equipment: Cane (specify quad or straight) ADL Screening (condition at time of admission) Patient's cognitive ability adequate to safely complete daily activities?: Yes Is the patient deaf  or have difficulty hearing?: No Does the patient have difficulty seeing, even when wearing glasses/contacts?: No Does the patient have difficulty concentrating, remembering, or making decisions?: No Patient able to express need for assistance with ADLs?: Yes Does the patient have difficulty dressing or bathing?: Yes Independently performs ADLs?: No Communication: Independent Dressing (OT): Needs assistance Is this a change from baseline?: Change from baseline, expected to last >3 days Grooming: Appropriate for developmental age Feeding: Needs assistance Is this a change from baseline?: Change from baseline, expected to last >3 days Bathing: Needs assistance Is this a change from baseline?: Change from baseline, expected to last >3 days Toileting: Needs assistance Is this a change from baseline?: Change from baseline, expected to last >3days In/Out Bed: Needs assistance Is this a change from baseline?: Change from baseline, expected to last >3 days Walks in Home: Independent with device (comment) Does the patient have difficulty walking or climbing stairs?: Yes Weakness of Legs: Both Weakness of Arms/Hands: None  Permission Sought/Granted Permission sought to share information with : Chartered certified accountant granted to share information with : Yes, Verbal Permission Granted     Permission granted to share info w AGENCY: SNF        Emotional Assessment Appearance:: Appears stated age Attitude/Demeanor/Rapport: Lethargic Affect (typically observed): Appropriate Orientation: : Oriented to Self, Oriented to Place, Oriented to  Time, Oriented to Situation Alcohol / Substance Use: Not Applicable Psych Involvement: No (comment)  Admission diagnosis:  Rhabdomyolysis [M62.82] Patient Active  Problem List   Diagnosis Date Noted   Pressure injury of skin 04/01/2022   Rhabdomyolysis 03/30/2022   Dehydration 03/30/2022   Psoriasis 11/13/2021   Dermatochalasis of both  upper eyelids 08/04/2021   Neck pain 05/10/2021   Other idiopathic scoliosis, 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, [place] [address] Phone: [phone] Fax: [phone]     Social Determinants of Health (SDOH) Interventions    Readmission Risk Interventions     No data to display

## 2022-04-02 NOTE — Evaluation (Signed)
Occupational Therapy Evaluation Patient Details Name: Sean Ward MRN: [id number] DOB: [birth year] Today's Date: 04/02/2022   History of Present Illness 81 y/o male presented to ED on 03/30/22 after being found down for unknown time (suspecting 3 days). Imaging negative for fractures. Admitted for rhabdomyolysis and dehydration. PMH: Parkinson's, Afib on Eliquis   Clinical Impression   Pt independent at baseline with ADLs and functional mobility, reports intermittently using a cane at home. Pt lives alone, in second floor apartment complex. Pt with decreased cognition and awareness of deficits. Pt currently min-max A for ADLs, and max A +2 for bed mobility. Transfers deferred at this time as pt unable to sit without support. Pt presenting with impairments listed below, will follow acutely. Recommend SNF at d/c.      Recommendations for follow up therapy are one component of a multi-disciplinary discharge planning process, led by the attending physician.  Recommendations may be updated based on patient status, additional functional criteria and insurance authorization.   Follow Up Recommendations  Skilled nursing-short term rehab (<3 hours/day)    Assistance Recommended at Discharge Frequent or constant Supervision/Assistance  Patient can return home with the following Two people to help with walking and/or transfers;A lot of help with bathing/dressing/bathroom;Assistance with cooking/housework;Direct supervision/assist for medications management;Direct supervision/assist for financial management;Assist for transportation;Help with stairs or ramp for entrance    Functional Status Assessment  Patient has had a recent decline in their functional status and demonstrates the ability to make significant improvements in function in a reasonable and predictable amount of time.  Equipment Recommendations  None recommended by OT;Other (comment) (defer to next venue of care)    Recommendations for  Other Services PT consult     Precautions / Restrictions Precautions Precautions: Fall Restrictions Weight Bearing Restrictions: No      Mobility Bed Mobility Overal bed mobility: Needs Assistance Bed Mobility: Supine to Sit     Supine to sit: Max assist, +2 for physical assistance Sit to supine: Max assist, +2 for physical assistance   General bed mobility comments: unable to fully elevate trunk for sitting, posterior lean    Transfers                   General transfer comment: deferred      Balance Overall balance assessment: Needs assistance Sitting-balance support: Bilateral upper extremity supported, Feet supported Sitting balance-Leahy Scale: Poor                                     ADL either performed or assessed with clinical judgement   ADL Overall ADL's : Needs assistance/impaired Eating/Feeding: Set up;Sitting   Grooming: Minimal assistance;Sitting;Standing   Upper Body Bathing: Moderate assistance;Bed level   Lower Body Bathing: Maximal assistance;Bed level   Upper Body Dressing : Moderate assistance;Bed level Upper Body Dressing Details (indicate cue type and reason): to don gown Lower Body Dressing: Maximal assistance;Bed level Lower Body Dressing Details (indicate cue type and reason): to don boots Toilet Transfer: Total assistance;+2 for physical assistance           Functional mobility during ADLs: Maximal assistance;Total assistance;+2 for physical assistance       Vision Baseline Vision/History: 1 Wears glasses (for reading only) Vision Assessment?: No apparent visual deficits     Perception     Praxis      Pertinent Vitals/Pain Pain Assessment Pain Assessment: Faces Pain Score: 10-Worst pain ever Faces  Pain Scale: Hurts worst Pain Location: R knee Pain Descriptors / Indicators: Aching, Discomfort, Grimacing, Sore Pain Intervention(s): Limited activity within patient's tolerance, Monitored during  session     Hand Dominance     Extremity/Trunk Assessment Upper Extremity Assessment Upper Extremity Assessment: Generalized weakness   Lower Extremity Assessment Lower Extremity Assessment: Defer to PT evaluation   Cervical / Trunk Assessment Cervical / Trunk Assessment: Kyphotic   Communication Communication Communication: No difficulties   Cognition Arousal/Alertness: Awake/alert Behavior During Therapy: WFL for tasks assessed/performed Overall Cognitive Status: Impaired/Different from baseline Area of Impairment: Attention, Memory, Safety/judgement, Awareness, Problem solving                   Current Attention Level: Sustained Memory: Decreased short-term memory   Safety/Judgement: Decreased awareness of safety, Decreased awareness of deficits Awareness: Emergent Problem Solving: Slow processing, Difficulty sequencing, Decreased initiation, Requires verbal cues, Requires tactile cues General Comments: disoriented to situation, unsure why he is here, requires reorientation; states he was having hallucinations after the fall     General Comments  VSS on RA    Exercises     Shoulder Instructions      Home Living Family/patient expects to be discharged to:: Private residence Living Arrangements: Alone Available Help at Discharge: Friend(s);Available PRN/intermittently;Neighbor Type of Home: Apartment Home Access: Stairs to enter [employer] of Steps: 15 Entrance Stairs-Rails: Can reach both Home Layout: One level     Bathroom Shower/Tub: Tub/shower unit;Curtain         Home Equipment: Cane - single point          Prior Functioning/Environment Prior Level of Function : Independent/Modified Independent;Driving             Mobility Comments: uses cane ADLs Comments: does IADLs        OT Problem List: Decreased strength;Decreased range of motion;Decreased activity tolerance;Impaired balance (sitting and/or standing);Decreased  safety awareness;Decreased coordination      OT Treatment/Interventions: Self-care/ADL training;Therapeutic exercise;Therapeutic activities;Balance training;Patient/family education;Cognitive remediation/compensation;DME and/or AE instruction;Energy conservation    OT Goals(Current goals can be found in the care plan section) Acute Rehab OT Goals Patient Stated Goal: to go home OT Goal Formulation: With patient Time For Goal Achievement: 04/16/22 Potential to Achieve Goals: Fair ADL Goals Pt Will Perform Grooming: with set-up;sitting;bed level;standing Pt Will Perform Upper Body Dressing: with min assist;standing;sitting;bed level Pt Will Perform Lower Body Dressing: with adaptive equipment;sitting/lateral leans;sit to/from stand;with min assist Pt Will Transfer to Toilet: with mod assist;squat pivot transfer;stand pivot transfer;bedside commode Additional ADL Goal #1: Pt will complete bed mobility with Mod A in prep for ADLs  OT Frequency: Min 2X/week    Co-evaluation              AM-PAC OT "6 Clicks" Daily Activity     Outcome Measure Help from another person eating meals?: A Little Help from another person taking care of personal grooming?: A Little Help from another person toileting, which includes using toliet, bedpan, or urinal?: Total Help from another person bathing (including washing, rinsing, drying)?: A Lot Help from another person to put on and taking off regular upper body clothing?: A Lot Help from another person to put on and taking off regular lower body clothing?: Total 6 Click Score: 12   End of Session Nurse Communication: Mobility status  Activity Tolerance: Patient tolerated treatment well Patient left: in bed;with call bell/phone within reach;with bed alarm set  OT Visit Diagnosis: Unsteadiness on feet (R26.81);Other abnormalities of gait and mobility (  R26.89);Muscle weakness (generalized) (M62.81);History of falling (Z91.81)                Time:  [id number] OT Time Calculation (min): 38 min Charges:  OT General Charges $OT Visit: 1 Visit OT Evaluation $OT Eval Moderate Complexity: 1 Mod OT Treatments $Self Care/Home Management : 8-22 mins $Therapeutic Activity: 8-22 mins  Alfonzo BeersNatalie Yoali Conry, OTD, OTR/L Acute Rehab [phone]  Mayer Maskeratalie M Jamall Strohmeier 04/02/2022, 5:19 PM

## 2022-04-02 NOTE — Progress Notes (Addendum)
PROGRESS NOTE    Sean Ward  [id number] DOB: [birth year] DOA: 03/30/2022 PCP: Elsie Stain, MD    Brief Narrative:  81 year old with history of Parkinson's disease, paroxysmal A-fib on Eliquis, severe right knee arthritis fell on the floor of his apartment and could not get up for 4 days, kept yelling out for help and finally someone heard him after 4 days and called EMS and brought to ER.  Denied any trauma but he had some bruise on the right hip area. In the emergency room hemodynamically stable.  Sodium 147.  Creatinine kinase 2700.  A skeletal survey negative.  Treated in the hospital.  Needs SNF placement that is pending.   Assessment & Plan:   Traumatic rhabdomyolysis, fall, ambulatory dysfunction due to severe right knee pain, moderate dehydration and hyponatremia: -Treated with IV fluids with clinical improvement.  Encourage oral intake. -PT OT, fall precautions, refer for rehab. -Unable to get up because of right knee arthritis, if he does go to rehab, he may be able to get right knee arthroplasty and improve his mobility.  Paroxysmal A-fib: No evidence of injury or bleeding.  Rate controlled in sinus rhythm on propanolol.  Continue Eliquis.  Parkinson's disease: Fairly controlled on Sinemet.  Continue.  Hypokalemia/hypophosphatemia: Replaced.  Pressure Injury 03/30/22 Hip Posterior;Proximal;Right Stage 2 -  Partial thickness loss of dermis presenting as a shallow open injury with a red, pink wound bed without slough. (Active)  03/30/22 2100  Location: Hip  Location Orientation: Posterior;Proximal;Right  Staging: Stage 2 -  Partial thickness loss of dermis presenting as a shallow open injury with a red, pink wound bed without slough.  Wound Description (Comments):   Present on Admission: Yes     Pressure Injury 03/30/22 Lumbar Lateral;Right Stage 1 -  Intact skin with non-blanchable redness of a localized area usually over a bony prominence. (Active)  03/30/22  2100  Location: Lumbar  Location Orientation: Lateral;Right  Staging: Stage 1 -  Intact skin with non-blanchable redness of a localized area usually over a bony prominence.  Wound Description (Comments):   Present on Admission: Yes     Pressure Injury 03/31/22 Lumbar Right Stage 2 -  Partial thickness loss of dermis presenting as a shallow open injury with a red, pink wound bed without slough. (Active)  03/31/22   Location: Lumbar  Location Orientation: Right  Staging: Stage 2 -  Partial thickness loss of dermis presenting as a shallow open injury with a red, pink wound bed without slough.  Wound Description (Comments):   Present on Admission:       Stable for discharge.   DVT prophylaxis:  apixaban (ELIQUIS) tablet 5 mg   Code Status: Full code Family Communication: None.  Patient does not have any family members in town.  He has children live in [place] but they are estranged as per the patient. Disposition Plan: Status is: Inpatient.  Remains inpatient.  Unsafe discharge planning. Medically stable to transfer to skilled nursing rehab.   Consultants:  None  Procedures:  None  Antimicrobials:  None   Subjective:  Seen and examined.  No complaints at rest.  Agreeable to go to rehab.  Objective: Vitals:   04/01/22 2056 04/02/22 0452 04/02/22 0914 04/02/22 1005  BP: (!) 96/56 110/75 (!) 93/59 100/62  Pulse: 67 72 68   Resp: 19 18 17    Temp:  98.2 F (36.8 C)    TempSrc:  Oral    SpO2: 96% 99% 96%   Weight:  Height:        Intake/Output Summary (Last 24 hours) at 04/02/2022 1042 Last data filed at 04/02/2022 0900 Gross per 24 hour  Intake 820 ml  Output 1725 ml  Net -905 ml   Filed Weights   03/30/22 2128  Weight: 74.2 kg    Examination:  General: Looks comfortable. Cardiovascular: S1-S2 normal. Respiratory: Bilateral clear. Gastrointestinal: Soft nontender. Ext: Right knee with chronic deformity and tenderness on mobility.  No erythema or  swelling. Neuro: Intact.     Data Reviewed: I have personally reviewed following labs and imaging studies  CBC: Recent Labs  Lab 03/30/22 1425 03/30/22 1457 03/30/22 2253 03/31/22 0216 04/01/22 0444  WBC 12.2*  --  10.5 10.4 7.5  NEUTROABS  --   --   --   --  4.8  HGB 16.6 16.3 14.6 14.3 13.6  HCT 48.0 48.0 42.6 42.1 39.3  MCV 90.1  --  90.3 90.3 89.7  PLT 251  --  227 219 [id number]   Basic Metabolic Panel: Recent Labs  Lab 03/30/22 1425 03/30/22 1457 03/31/22 0216 04/01/22 0444  NA 147* 148* 143 136  K 3.6 3.5 3.9 3.4*  CL 113* 114* 113* 103  CO2 21*  --  22 25  GLUCOSE 125* 119* 135* 97  BUN 50* 46* 39* 27*  CREATININE 1.17 1.00 1.14 1.09  CALCIUM 8.8*  --  8.3* 8.1*  MG  --   --   --  1.9  PHOS  --   --   --  2.3*   GFR: Estimated Creatinine Clearance: 55 mL/min (by C-G formula based on SCr of 1.09 mg/dL). Liver Function Tests: Recent Labs  Lab 03/30/22 1425 03/31/22 0216  AST 95* 87*  ALT 68* 13  ALKPHOS 111 95  BILITOT 2.9* 2.7*  PROT 6.0* 5.0*  ALBUMIN 2.8* 2.4*   No results for input(s): "LIPASE", "AMYLASE" in the last 168 hours. Recent Labs  Lab 03/30/22 1425  AMMONIA <10   Coagulation Profile: Recent Labs  Lab 03/30/22 1425  INR 1.4*   Cardiac Enzymes: Recent Labs  Lab 03/30/22 1425 03/31/22 0216 04/01/22 0444 04/02/22 0254  CKTOTAL 2,776* 2,092* 955* 600*   BNP (last 3 results) No results for input(s): "PROBNP" in the last 8760 hours. HbA1C: No results for input(s): "HGBA1C" in the last 72 hours. CBG: No results for input(s): "GLUCAP" in the last 168 hours. Lipid Profile: No results for input(s): "CHOL", "HDL", "LDLCALC", "TRIG", "CHOLHDL", "LDLDIRECT" in the last 72 hours. Thyroid Function Tests: No results for input(s): "TSH", "T4TOTAL", "FREET4", "T3FREE", "THYROIDAB" in the last 72 hours. Anemia Panel: No results for input(s): "VITAMINB12", "FOLATE", "FERRITIN", "TIBC", "IRON", "RETICCTPCT" in the last 72 hours. Sepsis  Labs: Recent Labs  Lab 03/30/22 1425  LATICACIDVEN 2.2*    Recent Results (from the past 240 hour(s))  Resp Panel by RT-PCR (Flu A&B, Covid) Anterior Nasal Swab     Status: None   Collection Time: 03/30/22  2:25 PM   Specimen: Anterior Nasal Swab  Result Value Ref Range Status   SARS Coronavirus 2 by RT PCR NEGATIVE NEGATIVE Final    Comment: (NOTE) SARS-CoV-2 target nucleic acids are NOT DETECTED.  The SARS-CoV-2 RNA is generally detectable in upper respiratory specimens during the acute phase of infection. The lowest concentration of SARS-CoV-2 viral copies this assay can detect is 138 copies/mL. A negative result does not preclude SARS-Cov-2 infection and should not be used as the sole basis for treatment or other patient management decisions.  A negative result may occur with  improper specimen collection/handling, submission of specimen other than nasopharyngeal swab, presence of viral mutation(s) within the areas targeted by this assay, and inadequate number of viral copies(<138 copies/mL). A negative result must be combined with clinical observations, patient history, and epidemiological information. The expected result is Negative.  Fact Sheet for Patients:  EntrepreneurPulse.com.au  Fact Sheet for Healthcare Providers:  IncredibleEmployment.be  This test is no t yet approved or cleared by the [place] FDA and  has been authorized for detection and/or diagnosis of SARS-CoV-2 by FDA under an Emergency Use Authorization (EUA). This EUA will remain  in effect (meaning this test can be used) for the duration of the COVID-19 declaration under Section 564(b)(1) of the Act, 21 U.S.C.section 360bbb-3(b)(1), unless the authorization is terminated  or revoked sooner.       Influenza A by PCR NEGATIVE NEGATIVE Final   Influenza B by PCR NEGATIVE NEGATIVE Final    Comment: (NOTE) The Xpert Xpress SARS-CoV-2/FLU/RSV plus assay is  intended as an aid in the diagnosis of influenza from Nasopharyngeal swab specimens and should not be used as a sole basis for treatment. Nasal washings and aspirates are unacceptable for Xpert Xpress SARS-CoV-2/FLU/RSV testing.  Fact Sheet for Patients: EntrepreneurPulse.com.au  Fact Sheet for Healthcare Providers: IncredibleEmployment.be  This test is not yet approved or cleared by the [place] FDA and has been authorized for detection and/or diagnosis of SARS-CoV-2 by FDA under an Emergency Use Authorization (EUA). This EUA will remain in effect (meaning this test can be used) for the duration of the COVID-19 declaration under Section 564(b)(1) of the Act, 21 U.S.C. section 360bbb-3(b)(1), unless the authorization is terminated or revoked.  Performed at [facility], [place] [address], [place], [place] [address]          Radiology Studies: No results found.      Scheduled Meds:  apixaban  5 mg Oral BID   carbidopa-levodopa  1 tablet Oral QHS   carbidopa-levodopa  1.5 tablet Oral 5 times per day   latanoprost  1 drop Both Eyes QHS   potassium & sodium phosphates  1 packet Oral TID WC & HS   propranolol ER  60 mg Oral Daily   Continuous Infusions:     LOS: 2 days    Time spent: 25 minutes    Barb Merino, MD [facility] Pager [phone]

## 2022-04-02 NOTE — Progress Notes (Incomplete)
   Established Patient Office Visit  Subjective   Patient ID: Sean Ward, male    DOB: 13-Jun-1941  Age: 81 y.o. MRN: [id number]  No chief complaint on file.  Pcv 20 HPI  {History (Optional):23778}  ROS    Objective:     There were no vitals taken for this visit. {Vitals History (Optional):23777}  Physical Exam   No results found for any visits on 04/03/22.  {Labs (Optional):23779}  The 10-year ASCVD risk score (Arnett DK, et al., 2019) is: 20.5%    Assessment & Plan:   Problem List Items Addressed This Visit   None   No follow-ups on file.    [name], MD

## 2022-04-03 ENCOUNTER — Encounter: Payer: Self-pay | Admitting: *Deleted

## 2022-04-03 ENCOUNTER — Ambulatory Visit: Payer: Medicare Other | Admitting: Critical Care Medicine

## 2022-04-03 LAB — CK: Total CK: 351 U/L (ref 49–397)

## 2022-04-03 NOTE — Progress Notes (Signed)
DISCHARGE NOTE SNF [facility] to be discharged Skilled nursing facility per MD order. Patient verbalized understanding.  Skin clean, dry and intact without evidence of skin break down, no evidence of skin tears noted. IV catheter discontinued intact. Site without signs and symptoms of complications. Dressing and pressure applied. Pt denies pain at the site currently. No complaints noted.  Patient free of lines, drains, and wounds.   Discharge packet assembled. An After Visit Summary (AVS) was printed and given to the EMS personnel. Patient escorted via stretcher and discharged to [facility] via ambulance. Report called to accepting facility; all questions and concerns addressed.   Myrtis Hopping, RN

## 2022-04-03 NOTE — Discharge Summary (Signed)
Physician Discharge Summary  Sean Ward [id number] DOB: 29-Nov-1940 DOA: 03/30/2022  PCP: Sean Stain, MD  Admit date: 03/30/2022 Discharge date: 04/03/2022  Admitted From: Home Disposition: Skilled nursing facility  Recommendations for Outpatient Follow-up:  Follow up with PCP in 1-2 weeks Schedule follow-up with orthopedics after discharge from rehab  Home Health: N/A Equipment/Devices: N/A  Discharge Condition: Stable CODE STATUS: Full code Diet recommendation: Low-salt diet  Discharge summary: 81 year old with history of Parkinson's disease, paroxysmal A-fib on Eliquis, severe right knee arthritis fell on the floor of his apartment and could not get up for 4 days, kept yelling out for help and finally someone heard him after 4 days and called EMS and brought to ER.  Denied any trauma but he had some bruise on the right hip area. In the emergency room hemodynamically stable.  Sodium 147.  Creatinine kinase 2700.  A skeletal survey negative. Treated in the hospital.  He remained in the hospital waiting for SNF availability.    Assessment & Plan:   Traumatic rhabdomyolysis, fall, ambulatory dysfunction due to severe right knee pain, moderate dehydration and hyponatremia: -Treated with IV fluids with clinical improvement.  Encourage oral intake.  Adequately improved. -PT OT, fall precautions, refer for skilled nursing facility for inpatient therapies. -Unable to get up because of right knee arthritis, if he does go to rehab, he may be able to get right knee arthroplasty and improve his mobility.  He is following up with Dr. Sharol Ward.   Paroxysmal A-fib: No evidence of injury or bleeding.  Rate controlled in sinus rhythm on propanolol.  Continue Eliquis.   Parkinson's disease: Fairly controlled on Sinemet.  Continue as he takes at home.  Patient takes Sinemet 5 times a day.   Hypokalemia/hypophosphatemia: Replaced and adequate.  Stable to discharge to SNF level of  care.  Discharge Diagnoses:  Principal Problem:   Rhabdomyolysis Active Problems:   Atrial fibrillation (HCC)   Parkinson disease ([place])   Dehydration   Pressure injury of skin    Discharge Instructions  Discharge Instructions     Diet - low sodium heart healthy   Complete by: As directed    Discharge wound care:   Complete by: As directed    Wound care  Daily      Comments: Wound care to right hip Stage 2 partial thickness PI (POA):  cleanse with NS, pat dry. Cover with folded piece of xeroform gauze, cover with silicone foam dressing. Change daily.    Wound care  Daily      Comments: Wound care to sacral Stage 1 pressure injury (POA): cleanse with NS, pat dry. Cover with silicone foam dressing for sacrum. Turn patient side to side and minimize time in the supine position.   Increase activity slowly   Complete by: As directed       Allergies as of 04/03/2022   No Known Allergies      Medication List     TAKE these medications    acetaminophen 500 MG tablet Commonly known as: TYLENOL Take 1,000 mg by mouth every 6 (six) hours as needed for moderate pain.   carbidopa-levodopa 25-100 MG tablet Commonly known as: SINEMET IR TAKE 1 & 1/2 TABLETS BY  MOUTH 5 TIMES A DAY, AT 8 AM, 11 AM, 2 PM, 5 PM AND 8 PM. What changed:  how much to take how to take this when to take this additional instructions   carbidopa-levodopa 50-200 MG tablet Commonly known as: SINEMET CR TAKE 1  TABLET BY MOUTH AT BEDTIME. What changed: Another medication with the same name was changed. Make sure you understand how and when to take each.   Eliquis 5 MG Tabs tablet Generic drug: apixaban TAKE 1 TABLET (5 MG TOTAL) BY MOUTH 2 (TWO) TIMES DAILY. What changed: how much to take   latanoprost 0.005 % ophthalmic solution Commonly known as: XALATAN APPLY 1 DROP TO EYE NIGHTLY. What changed:  how much to take how to take this when to take this Another medication with the same name was  removed. Continue taking this medication, and follow the directions you see here.   multivitamin tablet Take 1 tablet by mouth daily.   propranolol ER 60 MG 24 hr capsule Commonly known as: INDERAL LA TAKE 1 CAPSULE (60 MG TOTAL) BY MOUTH DAILY. What changed: how much to take               Discharge Care Instructions  (From admission, onward)           Start     Ordered   04/03/22 0000  Discharge wound care:       Comments: Wound care  Daily      Comments: Wound care to right hip Stage 2 partial thickness PI (POA):  cleanse with NS, pat dry. Cover with folded piece of xeroform gauze, cover with silicone foam dressing. Change daily.    Wound care  Daily      Comments: Wound care to sacral Stage 1 pressure injury (POA): cleanse with NS, pat dry. Cover with silicone foam dressing for sacrum. Turn patient side to side and minimize time in the supine position.   04/03/22 0713            No Known Allergies  Consultations: None   Procedures/Studies: CT CERVICAL SPINE WO CONTRAST  Result Date: 03/30/2022 CLINICAL DATA:  Fall and found down at home. EXAM: CT CERVICAL SPINE WITHOUT CONTRAST TECHNIQUE: Multidetector CT imaging of the cervical spine was performed without intravenous contrast. Multiplanar CT image reconstructions were also generated. RADIATION DOSE REDUCTION: This exam was performed according to the departmental dose-optimization program which includes automated exposure control, adjustment of the mA and/or kV according to patient size and/or use of iterative reconstruction technique. COMPARISON:  01/21/2014 FINDINGS: Alignment: No subluxation. Skull base and vertebrae: No acute fracture identified. Bilateral facet hypertrophy. Soft tissues and spinal canal: No prevertebral fluid or swelling. No visible canal hematoma. Disc levels: Progressive moderate degenerative disc disease of the cervical spine most significantly at the C4-5, C5-6 and C6-7 levels since the  prior CT in 2015. Upper chest: Negative. IMPRESSION: No acute fracture identified. 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 Electronically Signed   By: Aletta Edouard M.D.   On: 03/30/2022 17:06   CT CHEST ABDOMEN PELVIS W CONTRAST  Result Date: 03/30/2022 CLINICAL DATA:  Trauma.  Fall. EXAM: CT CHEST, ABDOMEN, AND PELVIS WITH CONTRAST TECHNIQUE: Multidetector CT imaging of the chest, abdomen and pelvis was performed following the standard protocol during bolus administration of intravenous contrast. RADIATION DOSE REDUCTION: This exam was performed according to the departmental dose-optimization program which includes automated exposure control, adjustment of the mA and/or kV according to patient size and/or use of iterative reconstruction technique. CONTRAST:  127mL OMNIPAQUE IOHEXOL 300 MG/ML  SOLN COMPARISON:  None Available. FINDINGS: CT CHEST FINDINGS Cardiovascular: Heart is mildly enlarged. Aorta is normal in size. There are atherosclerotic calcifications of the aorta. There is no  pericardial effusion. Mediastinum/Nodes: No enlarged mediastinal, hilar, or axillary lymph nodes. Thyroid gland, trachea, and esophagus demonstrate no significant findings. Lungs/Pleura: There is scarring in the lung apices. There is minimal dependent atelectasis bilaterally. No pleural effusion or pneumothorax. Musculoskeletal: No fractures are seen. CT ABDOMEN PELVIS FINDINGS Hepatobiliary: Gallstones are present. Liver and bile ducts are within normal limits. Pancreas: Unremarkable. No pancreatic ductal dilatation or surrounding inflammatory changes. Spleen: Normal in size without focal abnormality. Adrenals/Urinary Tract: There is a 2.7 cm cyst in the superior pole the right kidney. Otherwise, of the adrenal adrenal glands and kidneys are within normal limits. Tiny layering calculi are seen in the bladder. Stomach/Bowel: There is some rectal wall thickening. No  dilated bowel loops. No bowel obstruction, pneumatosis, focal inflammation or free air. Stomach is within normal limits. Vascular/Lymphatic: Aortic atherosclerosis. No enlarged abdominal or pelvic lymph nodes. Reproductive: Prostate gland is enlarged measuring 4.3 x 5.4 cm. Other: There is a large left inguinal hernia containing nondilated bowel and mesenteric fat. There is no ascites. Musculoskeletal: No acute fractures are seen. Degenerative changes affect the spine. IMPRESSION: 1. No acute posttraumatic sequelae in the chest, abdomen or pelvis. 2. Rectal wall thickening concerning for proctitis. Recommend clinical correlation and follow-up to exclude underlying lesion. 3. Large left inguinal hernia containing bowel. No bowel obstruction. 4. Small bladder calculi. 5. Cholelithiasis. 6. Prostatomegaly. 7.  Aortic Atherosclerosis (ICD10-I70.0). Electronically Signed   By: Ronney Asters M.D.   On: 03/30/2022 16:58   CT HEAD WO CONTRAST  Result Date: 03/30/2022 CLINICAL DATA:  Fall and found down at home. EXAM: CT HEAD WITHOUT CONTRAST TECHNIQUE: Contiguous axial images were obtained from the base of the skull through the vertex without intravenous contrast. RADIATION DOSE REDUCTION: This exam was performed according to the departmental dose-optimization program which includes automated exposure control, adjustment of the mA and/or kV according to patient size and/or use of iterative reconstruction technique. COMPARISON:  Prior CT of the head on 01/21/2014 FINDINGS: Brain: There is some progression of diffuse cortical atrophy since 2015. Some progressive mild periventricular small vessel disease also present. The brain demonstrates no evidence of hemorrhage, infarction, edema, mass effect, extra-axial fluid collection, hydrocephalus or mass lesion. Vascular: No hyperdense vessel or unexpected calcification. Skull: Normal. Negative for fracture or focal lesion. Sinuses/Orbits: Mucosal thickening in the visualized  right maxillary antrum. Other: None. IMPRESSION: Progressive atrophy and small vessel disease since the prior CT in 2015. No acute findings. Electronically Signed   By: Aletta Edouard M.D.   On: 03/30/2022 16:56   DG Chest Port 1 View  Result Date: 03/30/2022 CLINICAL DATA:  RIGHT knee pain, patient found down EXAM: PORTABLE CHEST 1 VIEW COMPARISON:  None Available. FINDINGS: Scoliosis of the spine. Normal cardiac silhouette. Lungs are clear. No fracture. No pneumothorax. IMPRESSION: No acute cardiopulmonary process. Electronically Signed   By: Suzy Bouchard M.D.   On: 03/30/2022 14:34   DG Pelvis Portable  Result Date: 03/30/2022 CLINICAL DATA:  Found on the ground, right femur pain EXAM: RIGHT FEMUR PORTABLE 1 VIEW; PORTABLE RIGHT KNEE - 1-2 VIEW; PORTABLE PELVIS 1-2 VIEWS COMPARISON:  None Available. FINDINGS: No acute hip fracture or dislocation. 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 Severe osteoarthritis of the patellofemoral compartment. No knee joint effusion. No aggressive osseous lesion. Normal alignment. Generalized osteopenia. Soft tissue are unremarkable. No radiopaque foreign body or soft tissue emphysema. IMPRESSION: 1.  No acute osseous injury of the right femur. 2.  No acute osseous injury of the  right knee. 3.  No acute osseous injury of the pelvis. Electronically Signed   By: Kathreen Devoid M.D.   On: 03/30/2022 14:29   DG Knee Right Port  Result Date: 03/30/2022 CLINICAL DATA:  Found on the ground, right femur pain EXAM: RIGHT FEMUR PORTABLE 1 VIEW; PORTABLE RIGHT KNEE - 1-2 VIEW; PORTABLE PELVIS 1-2 VIEWS COMPARISON:  None Available. FINDINGS: No acute hip fracture or dislocation. 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 Severe osteoarthritis of the patellofemoral compartment. No knee joint effusion. No aggressive osseous lesion. Normal alignment. Generalized osteopenia. Soft tissue are unremarkable. No radiopaque  foreign body or soft tissue emphysema. IMPRESSION: 1.  No acute osseous injury of the right femur. 2.  No acute osseous injury of the right knee. 3.  No acute osseous injury of the pelvis. Electronically Signed   By: Kathreen Devoid M.D.   On: 03/30/2022 14:29   DG FEMUR PORT, 1V RIGHT  Result Date: 03/30/2022 CLINICAL DATA:  Found on the ground, right femur pain EXAM: RIGHT FEMUR PORTABLE 1 VIEW; PORTABLE RIGHT KNEE - 1-2 VIEW; PORTABLE PELVIS 1-2 VIEWS COMPARISON:  None Available. FINDINGS: No acute hip fracture or dislocation. 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 Severe osteoarthritis of the patellofemoral compartment. No knee joint effusion. No aggressive osseous lesion. Normal alignment. Generalized osteopenia. Soft tissue are unremarkable. No radiopaque foreign body or soft tissue emphysema. IMPRESSION: 1.  No acute osseous injury of the right femur. 2.  No acute osseous injury of the right knee. 3.  No acute osseous injury of the pelvis. Electronically Signed   By: Kathreen Devoid M.D.   On: 03/30/2022 14:29   XR Knee 1-2 Views Right  Result Date: 03/08/2022 2 view radiographs of the right knee shows advanced arthritic changes worse in the right than the left knee.  There is joint space narrowing subchondral or sclerosis and cysts and osteophytic bone spurs in all 3 compartments.  (Echo, Carotid, EGD, Colonoscopy, ERCP)    Subjective: Patient seen and examined.  No overnight events.  Pain remains in the right knee on attempted mobility.  He was not confident whether he can walk on that right knee or not.  He will focus on rehab.   Discharge Exam: Vitals:   04/02/22 2018 04/03/22 0548  BP: (!) 101/53 117/88  Pulse: 61 74  Resp: 18 18  Temp: 98.9 F (37.2 C) 97.9 F (36.6 C)  SpO2: 100% 98%   Vitals:   04/02/22 1005 04/02/22 1525 04/02/22 2018 04/03/22 0548  BP: 100/62 110/66 (!) 101/53 117/88  Pulse:  71 61 74  Resp:  16 18 18   Temp:   98.9 F (37.2  C) 97.9 F (36.6 C)  TempSrc:   Oral Oral  SpO2:  98% 100% 98%  Weight:      Height:        General: Pt is alert, awake, not in acute distress Cardiovascular: RRR, S1/S2 +, no rubs, no gallops Respiratory: CTA bilaterally, no wheezing, no rhonchi Abdominal: Soft, NT, ND, bowel sounds + Extremities: no edema, no cyanosis Chronic arthritic deformity of the right knee with no palpable tenderness but stiffness.    The results of significant diagnostics from this hospitalization (including imaging, microbiology, ancillary and laboratory) are listed below for reference.     Microbiology: Recent Results (from the past 240 hour(s))  Resp Panel by RT-PCR (Flu A&B, Covid) Anterior Nasal Swab     Status: None   Collection Time: 03/30/22  2:25 PM   Specimen: Anterior Nasal Swab  Result Value Ref Range Status   SARS Coronavirus 2 by RT PCR NEGATIVE NEGATIVE Final    Comment: (NOTE) SARS-CoV-2 target nucleic acids are NOT DETECTED.  The SARS-CoV-2 RNA is generally detectable in upper respiratory specimens during the acute phase of infection. The lowest concentration of SARS-CoV-2 viral copies this assay can detect is 138 copies/mL. A negative result does not preclude SARS-Cov-2 infection and should not be used as the sole basis for treatment or other patient management decisions. A negative result may occur with  improper specimen collection/handling, submission of specimen other than nasopharyngeal swab, presence of viral mutation(s) within the areas targeted by this assay, and inadequate number of viral copies(<138 copies/mL). A negative result must be combined with clinical observations, patient history, and epidemiological information. The expected result is Negative.  Fact Sheet for Patients:  EntrepreneurPulse.com.au  Fact Sheet for Healthcare Providers:  IncredibleEmployment.be  This test is no t yet approved or cleared by the [place] FDA and  has been authorized for detection and/or diagnosis of SARS-CoV-2 by FDA under an Emergency Use Authorization (EUA). This EUA will remain  in effect (meaning this test can be used) for the duration of the COVID-19 declaration under Section 564(b)(1) of the Act, 21 U.S.C.section 360bbb-3(b)(1), unless the authorization is terminated  or revoked sooner.       Influenza A by PCR NEGATIVE NEGATIVE Final   Influenza B by PCR NEGATIVE NEGATIVE Final    Comment: (NOTE) The Xpert Xpress SARS-CoV-2/FLU/RSV plus assay is intended as an aid in the diagnosis of influenza from Nasopharyngeal swab specimens and should not be used as a sole basis for treatment. Nasal washings and aspirates are unacceptable for Xpert Xpress SARS-CoV-2/FLU/RSV testing.  Fact Sheet for Patients: EntrepreneurPulse.com.au  Fact Sheet for Healthcare Providers: IncredibleEmployment.be  This test is not yet approved or cleared by the [place] FDA and has been authorized for detection and/or diagnosis of SARS-CoV-2 by FDA under an Emergency Use Authorization (EUA). This EUA will remain in effect (meaning this test can be used) for the duration of the COVID-19 declaration under Section 564(b)(1) of the Act, 21 U.S.C. section 360bbb-3(b)(1), unless the authorization is terminated or revoked.  Performed at [facility], [place] [address] [place], [place] [address]      Labs: BNP (last 3 results) No results for input(s): "BNP" in the last 8760 hours. Basic Metabolic Panel: Recent Labs  Lab 03/30/22 1425 03/30/22 1457 03/31/22 0216 04/01/22 0444  NA 147* 148* 143 136  K 3.6 3.5 3.9 3.4*  CL 113* 114* 113* 103  CO2 21*  --  22 25  GLUCOSE 125* 119* 135* 97  BUN 50* 46* 39* 27*  CREATININE 1.17 1.00 1.14 1.09  CALCIUM 8.8*  --  8.3* 8.1*  MG  --   --   --  1.9  PHOS  --   --   --  2.3*   Liver Function Tests: Recent Labs  Lab 03/30/22 1425  03/31/22 0216  AST 95* 87*  ALT 68* 13  ALKPHOS 111 95  BILITOT 2.9* 2.7*  PROT 6.0* 5.0*  ALBUMIN 2.8* 2.4*   No results for input(s): "LIPASE", "AMYLASE" in the last 168 hours. Recent Labs  Lab 03/30/22 1425  AMMONIA <10   CBC: Recent Labs  Lab 03/30/22 1425 03/30/22 1457 03/30/22 2253 03/31/22 0216 04/01/22 0444  WBC 12.2*  --  10.5 10.4 7.5  NEUTROABS  --   --   --   --  4.8  HGB 16.6 16.3 14.6 14.3 13.6  HCT 48.0 48.0 42.6 42.1 39.3  MCV 90.1  --  90.3 90.3 89.7  PLT 251  --  227 219 203   Cardiac Enzymes: Recent Labs  Lab 03/30/22 1425 03/31/22 0216 04/01/22 0444 04/02/22 0254 04/03/22 0417  CKTOTAL 2,776* 2,092* 955* 600* 351   BNP: Invalid input(s): "POCBNP" CBG: No results for input(s): "GLUCAP" in the last 168 hours. D-Dimer No results for input(s): "DDIMER" in the last 72 hours. Hgb A1c No results for input(s): "HGBA1C" in the last 72 hours. Lipid Profile No results for input(s): "CHOL", "HDL", "LDLCALC", "TRIG", "CHOLHDL", "LDLDIRECT" in the last 72 hours. Thyroid function studies No results for input(s): "TSH", "T4TOTAL", "T3FREE", "THYROIDAB" in the last 72 hours.  Invalid input(s): "FREET3" Anemia work up No results for input(s): "VITAMINB12", "FOLATE", "FERRITIN", "TIBC", "IRON", "RETICCTPCT" in the last 72 hours. Urinalysis    Component Value Date/Time   COLORURINE YELLOW 03/30/2022 1821   APPEARANCEUR HAZY (A) 03/30/2022 1821   APPEARANCEUR Clear 08/21/2017 1601   LABSPEC >1.046 (H) 03/30/2022 1821   PHURINE 6.0 03/30/2022 1821   GLUCOSEU NEGATIVE 03/30/2022 1821   HGBUR LARGE (A) 03/30/2022 1821   BILIRUBINUR NEGATIVE 03/30/2022 1821   BILIRUBINUR Negative 08/21/2017 1601   KETONESUR 20 (A) 03/30/2022 1821   PROTEINUR 30 (A) 03/30/2022 1821   NITRITE NEGATIVE 03/30/2022 1821   LEUKOCYTESUR NEGATIVE 03/30/2022 1821   Sepsis Labs Recent Labs  Lab 03/30/22 1425 03/30/22 2253 03/31/22 0216 04/01/22 0444  WBC 12.2* 10.5  10.4 7.5   Microbiology Recent Results (from the past 240 hour(s))  Resp Panel by RT-PCR (Flu A&B, Covid) Anterior Nasal Swab     Status: None   Collection Time: 03/30/22  2:25 PM   Specimen: Anterior Nasal Swab  Result Value Ref Range Status   SARS Coronavirus 2 by RT PCR NEGATIVE NEGATIVE Final    Comment: (NOTE) SARS-CoV-2 target nucleic acids are NOT DETECTED.  The SARS-CoV-2 RNA is generally detectable in upper respiratory specimens during the acute phase of infection. The lowest concentration of SARS-CoV-2 viral copies this assay can detect is 138 copies/mL. A negative result does not preclude SARS-Cov-2 infection and should not be used as the sole basis for treatment or other patient management decisions. A negative result may occur with  improper specimen collection/handling, submission of specimen other than nasopharyngeal swab, presence of viral mutation(s) within the areas targeted by this assay, and inadequate number of viral copies(<138 copies/mL). A negative result must be combined with clinical observations, patient history, and epidemiological information. The expected result is Negative.  Fact Sheet for Patients:  EntrepreneurPulse.com.au  Fact Sheet for Healthcare Providers:  IncredibleEmployment.be  This test is no t yet approved or cleared by the [place] FDA and  has been authorized for detection and/or diagnosis of SARS-CoV-2 by FDA under an Emergency Use Authorization (EUA). This EUA will remain  in effect (meaning this test can be used) for the duration of the COVID-19 declaration under Section 564(b)(1) of the Act, 21 U.S.C.section 360bbb-3(b)(1), unless the authorization is terminated  or revoked sooner.       Influenza A by PCR NEGATIVE NEGATIVE Final   Influenza B by PCR NEGATIVE NEGATIVE Final    Comment: (NOTE) The Xpert Xpress SARS-CoV-2/FLU/RSV plus assay is intended as an aid in the diagnosis of  influenza from Nasopharyngeal swab specimens and should not be used as a sole basis for treatment. Nasal washings and aspirates are unacceptable for Xpert Xpress  SARS-CoV-2/FLU/RSV testing.  Fact Sheet for Patients: EntrepreneurPulse.com.au  Fact Sheet for Healthcare Providers: IncredibleEmployment.be  This test is not yet approved or cleared by the [place] FDA and has been authorized for detection and/or diagnosis of SARS-CoV-2 by FDA under an Emergency Use Authorization (EUA). This EUA will remain in effect (meaning this test can be used) for the duration of the COVID-19 declaration under Section 564(b)(1) of the Act, 21 U.S.C. section 360bbb-3(b)(1), unless the authorization is terminated or revoked.  Performed at [facility], [place] [address], [place], [place] [address]      Time coordinating discharge: 32 minutes  SIGNED:   Barb Merino, MD  [facility] 04/03/2022, 7:13 AM

## 2022-04-03 NOTE — Progress Notes (Signed)
Report called to [facility] [phone]. Patient going to room 104P.  Jean Rosenthal, RN

## 2022-04-03 NOTE — Plan of Care (Signed)

## 2022-04-03 NOTE — TOC Transition Note (Addendum)
Transition of Care [facility]) - CM/SW Discharge Note   Patient Details  Name: Sean Ward MRN: [id number] Date of Birth: [birth year]  [facility] CM/SW Contact:  Ralene Bathe, LCSWA Phone Number: 04/03/2022, 10:24 AM   Clinical Narrative:    [facility] Anticipated DC date:  04/03/2022 Family notified: Yes Transport by: Sharin Mons   Per MD patient ready for DC to SNF. RN to call report prior to discharge [phone] room 104P. RN, patient, patient's family, and facility notified of DC. Discharge Summary and FL2 sent to facility. DC packet on chart. Ambulance transport requested for patient.   CSW will sign off for now as social work intervention is no longer needed. Please consult [place] again if new needs arise.     Final next level of care: Skilled Nursing Facility Barriers to Discharge: Barriers Resolved   Patient Goals and CMS Choice Patient states their goals for this hospitalization and ongoing recovery are:: to get better CMS Medicare.gov Compare Post Acute Care list provided to:: Patient Choice offered to / list presented to : Patient  Discharge Placement PASRR number recieved: 04/02/22            Patient [facility] bed at: [facility] Patient to be transferred to facility by: [facility] Name of family member notified: Wilcox,Marlis (Friend)   [phone] Patient and family notified of of transfer: 04/03/22  Discharge Plan and Services     Post Acute Care Choice: Skilled Nursing Facility                               Social Determinants of Health (SDOH) Interventions     Readmission Risk Interventions     No data to display

## 2022-04-17 ENCOUNTER — Ambulatory Visit (INDEPENDENT_AMBULATORY_CARE_PROVIDER_SITE_OTHER): Payer: Medicare Other | Admitting: Orthopaedic Surgery

## 2022-04-17 ENCOUNTER — Encounter: Payer: Self-pay | Admitting: Orthopaedic Surgery

## 2022-04-17 VITALS — BP 112/73 | HR 80 | Ht 69.0 in | Wt 163.6 lb

## 2022-04-17 DIAGNOSIS — M1711 Unilateral primary osteoarthritis, right knee: Secondary | ICD-10-CM | POA: Diagnosis not present

## 2022-04-17 NOTE — Progress Notes (Signed)
Office Visit Note   Patient: Sean Ward           Date of Birth: Mar 10, 1941           MRN: [id number] Visit Date: 04/17/2022              Requested by: [name], MD [address] [place],  [place] [address] PCP: [name], MD   Assessment & Plan: Visit Diagnoses:  1. Primary osteoarthritis of right knee     Plan: We discussed with patient that treatment for his severe knee osteoarthritis last wire total knee arthroplasty.  We discussed home therapy postop therapy importance of compliance with therapy problems with infection stiffness.  He need to stop his anticoagulation for 48 hours before surgery.  Options for injection discussed in detail which previously did not seem to help.  We discussed Visco supplement which is unlikely to give him any relief with his severe knee arthritis.  Follow-Up Instructions: Return in about 2 months (around 06/17/2022).   Orders:  No orders of the defined types were placed in this encounter.  No orders of the defined types were placed in this encounter.     Procedures: No procedures performed   Clinical Data: No additional findings.   Subjective: Chief Complaint  Patient presents with   Right Knee - Pain    HPI 81 year old male seen with severe right knee pain.  Previous x-rays had shown advanced arthritis most severe at the patellofemoral joint bone-on-bone changes.  He has subchondral sclerosis and cystic formation.  He has been taking tramadol states he has not been able to walk for 2 weeks due to his severe knee arthritis.  Patient's got atrial fibrillation Parkinson's disease on Eliquis.  He follows primary floor can get up to 4 days and has been in a skilled facility.  [facility].  Patient had some rhabdomyolysis.  Review of Systems all the systems noncontributory to HPI.   Objective: Vital Signs: BP 112/73   Pulse 80   Ht 5\' 9"  (1.753 m)   Wt 163 lb 9.3 oz (74.2 kg)   BMI 24.16 kg/m    Physical Exam Constitutional:      Appearance: He is well-developed.  HENT:     Head: Normocephalic and atraumatic.     Right Ear: External ear normal.     Left Ear: External ear normal.  Eyes:     Pupils: Pupils are equal, round, and reactive to light.  Neck:     Thyroid: No thyromegaly.     Trachea: No tracheal deviation.  Cardiovascular:     Rate and Rhythm: Normal rate.  Pulmonary:     Effort: Pulmonary effort is normal.     Breath sounds: No wheezing.  Abdominal:     General: Bowel sounds are normal.     Palpations: Abdomen is soft.  Musculoskeletal:     Cervical back: Neck supple.  Skin:    General: Skin is warm and dry.     Capillary Refill: Capillary refill takes less than 2 seconds.  Neurological:     Mental Status: He is alert and oriented to person, place, and time.  Psychiatric:        Behavior: Behavior normal.        Thought Content: Thought content normal.        Judgment: Judgment normal.     Ortho Exam patient has knee crepitus as significant knee osteoarthritis.  There is 2+ knee effusion severe  crepitus.  He lacks few degrees reach full extension flexes just to 90 degrees.  Specialty Comments:  No specialty comments available.  Imaging: No results found.   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   Lumbar back pain 05/10/2021   Parkinson disease (HCC) 02/28/2018   Primary osteoarthritis of right knee 08/16/2016   Dyslipidemia 08/16/2016   Unsteady gait 05/24/2016   Atrial fibrillation (HCC) 10/27/2014   Onychomycosis 05/03/2014   Tremor 04/08/2014   Venous stasis 03/25/2014   Past Medical History:  Diagnosis Date   Anticoagulant long-term use    Back pain with  radiation    Hernia, inguinal, left 05/03/2014   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   History of bronchitis    Inguinal hernia, right 04/08/2014   Parkinson's disease (HCC)    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HCC)    Scoliosis of cervicothoracic spine    Scoliosis of thoracolumbar spine    Spondylosis of lumbar spine     Family History  Problem Relation Age of Onset   Deafness Mother    Deafness Sister    Heart disease Neg Hx     Past Surgical History:  Procedure Laterality Date   HERNIA REPAIR     TONSILLECTOMY     Social History   Occupational History   Not on file  Tobacco Use   Smoking status: Never   Smokeless tobacco: Never  Vaping Use   Vaping Use: Never used  Substance and Sexual Activity   Alcohol use: Yes    Alcohol/week: 3.0 standard drinks of alcohol    Types: 3 Glasses of wine per week    Comment: 1 glass of red wine with dinner some nights   Drug use: No   Sexual activity: Not Currently

## 2022-04-23 ENCOUNTER — Telehealth: Payer: Self-pay

## 2022-04-23 NOTE — Telephone Encounter (Signed)
I will need more information including probably an OV to sort this out,  Sean Ward can you help with context, he has missed several appts and I see where he had a severe fall with rhabdomyolysis and was placed in SNF  Is he at a snf now or at home?  I really need to see him in office face to face to sort out

## 2022-04-23 NOTE — Telephone Encounter (Signed)
Copied from CRM [id number]. Topic: Referral - Request for Referral >> Apr 23, 2022  9:27 AM [id number] wrote: Reason for CRM: pt called in to request a referral for a rehabilitation program. Pt says that he is currently at a rehabilitation location and was told that he would need his PCP to set him up at a different location. Pt would like further assistance.    [phone] -

## 2022-04-23 NOTE — Telephone Encounter (Signed)
Call returned to patient # [phone] to clarify his request, message left with call back requested

## 2022-04-26 NOTE — Telephone Encounter (Signed)
Attempted again to contact patient # [phone] to clarify his request, message left with call back requested. He now has an appointment scheduled with Dr Delford Field at [facility] - 05/09/2022.

## 2022-04-27 ENCOUNTER — Emergency Department (HOSPITAL_COMMUNITY): Payer: Medicare Other

## 2022-04-27 ENCOUNTER — Emergency Department (HOSPITAL_COMMUNITY)
Admission: EM | Admit: 2022-04-27 | Discharge: 2022-04-28 | Disposition: A | Discharge status: Home / Self Care | Payer: Medicare Other | Attending: Emergency Medicine | Admitting: Emergency Medicine

## 2022-04-27 ENCOUNTER — Emergency Department (HOSPITAL_BASED_OUTPATIENT_CLINIC_OR_DEPARTMENT_OTHER): Payer: Medicare Other

## 2022-04-27 ENCOUNTER — Encounter (HOSPITAL_COMMUNITY): Payer: Medicare Other

## 2022-04-27 DIAGNOSIS — Z7984 Long term (current) use of oral hypoglycemic drugs: Secondary | ICD-10-CM | POA: Diagnosis not present

## 2022-04-27 DIAGNOSIS — M25561 Pain in right knee: Secondary | ICD-10-CM | POA: Insufficient documentation

## 2022-04-27 DIAGNOSIS — G8929 Other chronic pain: Secondary | ICD-10-CM | POA: Diagnosis not present

## 2022-04-27 DIAGNOSIS — I739 Peripheral vascular disease, unspecified: Secondary | ICD-10-CM

## 2022-04-27 DIAGNOSIS — Z79899 Other long term (current) drug therapy: Secondary | ICD-10-CM | POA: Diagnosis not present

## 2022-04-27 LAB — CBC WITH DIFFERENTIAL/PLATELET
Abs Immature Granulocytes: 0.04 10*3/uL (ref 0.00–0.07)
Basophils Absolute: 0 10*3/uL (ref 0.0–0.1)
Basophils Relative: 1 %
Eosinophils Absolute: 0.2 10*3/uL (ref 0.0–0.5)
Eosinophils Relative: 3 %
HCT: 47 % (ref 39.0–52.0)
Hemoglobin: 15.8 g/dL (ref 13.0–17.0)
Immature Granulocytes: 1 %
Lymphocytes Relative: 18 %
Lymphs Abs: 1.2 10*3/uL (ref 0.7–4.0)
MCH: 31 pg (ref 26.0–34.0)
MCHC: 33.6 g/dL (ref 30.0–36.0)
MCV: 92.3 fL (ref 80.0–100.0)
Monocytes Absolute: 0.7 10*3/uL (ref 0.1–1.0)
Monocytes Relative: 11 %
Neutro Abs: 4.7 10*3/uL (ref 1.7–7.7)
Neutrophils Relative %: 66 %
Platelets: 232 10*3/uL (ref 150–400)
RBC: 5.09 MIL/uL (ref 4.22–5.81)
RDW: 15 % (ref 11.5–15.5)
WBC: 7 10*3/uL (ref 4.0–10.5)
nRBC: 0 % (ref 0.0–0.2)

## 2022-04-27 LAB — COMPREHENSIVE METABOLIC PANEL
ALT: 21 U/L (ref 0–44)
AST: 20 U/L (ref 15–41)
Albumin: 3.4 g/dL — ABNORMAL LOW (ref 3.5–5.0)
Alkaline Phosphatase: 104 U/L (ref 38–126)
Anion gap: 9 (ref 5–15)
BUN: 23 mg/dL (ref 8–23)
CO2: 22 mmol/L (ref 22–32)
Calcium: 9.1 mg/dL (ref 8.9–10.3)
Chloride: 107 mmol/L (ref 98–111)
Creatinine, Ser: 1.33 mg/dL — ABNORMAL HIGH (ref 0.61–1.24)
GFR, Estimated: 54 mL/min — ABNORMAL LOW (ref >60)
Glucose, Bld: 162 mg/dL — ABNORMAL HIGH (ref 70–99)
Potassium: 4.2 mmol/L (ref 3.5–5.1)
Sodium: 138 mmol/L (ref 135–145)
Total Bilirubin: 1.7 mg/dL — ABNORMAL HIGH (ref 0.3–1.2)
Total Protein: 6.7 g/dL (ref 6.5–8.1)

## 2022-04-27 MED ORDER — ACETAMINOPHEN 500 MG PO TABS
1000.0000 mg | ORAL_TABLET | Freq: Four times a day (QID) | ORAL | Status: DC | PRN
Start: 2022-04-27 — End: 2022-04-28

## 2022-04-27 MED ORDER — PROPRANOLOL HCL ER 60 MG PO CP24
60.0000 mg | ORAL_CAPSULE | Freq: Every day | ORAL | Status: DC
  Administered 2022-04-28: 60 mg via ORAL
  Filled 2022-04-27: qty 1

## 2022-04-27 MED ORDER — LATANOPROST 0.005 % OP SOLN
1.0000 [drp] | Freq: Every day | OPHTHALMIC | Status: DC
Start: 2022-04-27 — End: 2022-04-28
  Filled 2022-04-27: qty 2.5

## 2022-04-27 MED ORDER — CARBIDOPA-LEVODOPA ER 50-200 MG PO TBCR: 1.0000 | EXTENDED_RELEASE_TABLET | Freq: Every day | ORAL | Status: DC

## 2022-04-27 MED ORDER — CARBIDOPA-LEVODOPA 25-100 MG PO TABS
1.5000 | ORAL_TABLET | Freq: Every day | ORAL | Status: DC
  Administered 2022-04-28 (×2): 1.5 via ORAL
  Filled 2022-04-27 (×5): qty 1.5

## 2022-04-27 MED ORDER — APIXABAN 5 MG PO TABS: 5.0000 mg | ORAL_TABLET | Freq: Two times a day (BID) | ORAL | Status: DC

## 2022-04-27 NOTE — ED Triage Notes (Signed)
Pt arrives via EMS and states that he was recently placed in a rehab facility due to R knee pain and falling. Pt states that he was kicked out of rehab facility due and put in a hotel. Pt requesting social work consult and evaluation of R knee pain for rehab placement and possible surgery.

## 2022-04-27 NOTE — Progress Notes (Signed)
Transition of Care [facility]) - Emergency Department Mini Assessment   Patient Details  Name: Sean Ward MRN: [id number] Date of Birth: 11/26/1940  Transition of Care [facility]) CM/SW Contact:    Lavenia Atlas, RN Phone Number: 04/27/2022, 10:21 PM   Clinical Narrative: Patient presents via EMS to [facility] with right knee knee pain. RNCM received TOC consult for due to patient being d/c from SNF.   ED Mini Assessment:   Patient reports he was discharged from [facility] yesterday and stayed at hotel.Patient shared paperwork with this RNCM, SNF [facility] referred patient to [facility] for DME supplies and [facility] for HHC: PT, OT, ST, SW. Patient reports Rosey Bath with [facility] told him to come to the hospital and we would find somewhere for him to go. This RNCM explained patient that his insurance will cover SNF for 20 days after that he will need to pay copay. Patient reports he has $1499 per month and not sure where he can be placed. This RNCM explained to patient most private pay facilities will charge him $7000+, and advised patient he can obtain placement on the outpatient setting, not the hospital. When asked about family, patient reports his ex-wife and children live in [place] and patient declined to share there contact information. Patient reports he has 24 steps to get into his apartment. This RNCM advised we can provide transportation home via [facility]. There is a friend on file for patient who he states is his neighbor. TOC unable to assist with SNF placement at this time.  TOC will continue to follow.     Patient Contact and Communications    Patient : [phone]    Admission diagnosis:  Evaluation for Knee Pain Patient Active Problem List   Diagnosis Date Noted   Pressure injury of skin 04/01/2022   Rhabdomyolysis 03/30/2022   Dehydration 03/30/2022   Psoriasis 11/13/2021   Dermatochalasis of both upper eyelids 08/04/2021   Neck pain 05/10/2021   Other idiopathic scoliosis,  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   Lumbar back pain 05/10/2021   Parkinson disease (HCC) 02/28/2018   Primary osteoarthritis of right knee 08/16/2016   Dyslipidemia 08/16/2016   Unsteady gait 05/24/2016   Atrial fibrillation (HCC) 10/27/2014   Onychomycosis 05/03/2014   Tremor 04/08/2014   Venous stasis 03/25/2014   PCP:  [name], MD Pharmacy:   [facility] at [facility] [address] [place] [address] Phone: [phone] Fax: [phone]

## 2022-04-27 NOTE — ED Provider Notes (Signed)
[facility] DEPT Provider Note   CSN: [id number] Arrival date & time: 04/27/22  1534     History  Chief Complaint  Patient presents with    Evaluation   Knee Pain    Sean Ward is a 81 y.o. male.   Knee Pain Associated symptoms: no back pain and no fever    81 year old male presents emergency department with complaints of right knee pain.  Patient states that pain has been chronic in nature.  He recently was admitted to the hospital on 03/30/2022 for a fall where he was on the ground for 3 to 4 days.  He was placed at a SNF from discharge up until 2 days ago.  They released him from the SNF and placed him in a hotel because his insurance would not cover the cost of SNF.  He was told upon release to come back to the hospital to get in contact with social work for further SNF placement.  He had physical therapy set up while he was at the SNF but he was never able to make an appointment.  He was seen by orthopedics Dr. Lorin Mercy regarding the pain in his right knee on 04/17/2022.  Currently denies any new falls or traumas.  Right knee pain is unchanged from baseline.  He does state that he is unable to walk secondary to lower extremity weakness since the fall.   He is unsure which medicines to take and how often because the SNF was organizing his medicines for him.  Denies fever, chills, night sweats, chest pain, shortness of breath, abdominal pain, nausea/vomiting/diarrhea, urinary symptoms, change in bowel habits.  Home Medications Prior to Admission medications   Medication Sig Start Date End Date Taking? Authorizing Provider  acetaminophen (TYLENOL) 500 MG tablet Take 1,000 mg by mouth every 6 (six) hours as needed for moderate pain.    [provider]  apixaban (ELIQUIS) 5 MG TABS tablet TAKE 1 TABLET (5 MG TOTAL) BY MOUTH 2 (TWO) TIMES DAILY. Patient taking differently: Take 5 mg by mouth 2 (two) times daily. 09/11/21 09/11/22  Elsie Stain, MD   carbidopa-levodopa (SINEMET CR) 50-200 MG tablet TAKE 1 TABLET BY MOUTH AT BEDTIME. 05/10/21 05/10/22  Elsie Stain, MD  carbidopa-levodopa (SINEMET IR) 25-100 MG tablet TAKE 1 & 1/2 TABLETS BY  MOUTH 5 TIMES A DAY, AT 8 AM, 11 AM, 2 PM, 5 PM AND 8 PM. Patient taking differently: Take 1.5 tablets by mouth See admin instructions. Takes 5 times a day 05/10/21 05/10/22  Elsie Stain, MD  latanoprost (XALATAN) 0.005 % ophthalmic solution APPLY 1 DROP TO EYE NIGHTLY. Patient taking differently: Place 1 drop into both eyes at bedtime. 05/10/21 05/10/22  Elsie Stain, MD  Multiple Vitamin (MULTIVITAMIN) tablet Take 1 tablet by mouth daily.    [provider]  propranolol ER (INDERAL LA) 60 MG 24 hr capsule TAKE 1 CAPSULE (60 MG TOTAL) BY MOUTH DAILY. Patient taking differently: Take 60 mg by mouth daily. 05/10/21 05/10/22  Elsie Stain, MD      Allergies    Patient has no known allergies.    Review of Systems   Review of Systems  Constitutional:  Negative for chills and fever.  HENT:  Negative for ear pain and sore throat.   Eyes:  Negative for pain and visual disturbance.  Respiratory:  Negative for cough and shortness of breath.   Cardiovascular:  Negative for chest pain and palpitations.  Gastrointestinal:  Negative  for abdominal pain and vomiting.  Genitourinary:  Negative for dysuria and hematuria.  Musculoskeletal:  Positive for arthralgias. Negative for back pain.       Right knee pain  Skin:  Negative for color change and rash.  Neurological:  Positive for weakness. Negative for seizures and syncope.  All other systems reviewed and are negative.   Physical Exam Updated Vital Signs BP 114/89   Pulse 91   Temp 97.7 F (36.5 C)   Resp 16   SpO2 96%  Physical Exam Vitals and nursing note reviewed.  Constitutional:      General: He is not in acute distress.    Appearance: He is well-developed.  HENT:     Head: Normocephalic and atraumatic.  Eyes:      Extraocular Movements: Extraocular movements intact.     Conjunctiva/sclera: Conjunctivae normal.  Cardiovascular:     Rate and Rhythm: Normal rate and regular rhythm.     Heart sounds: No murmur heard. Pulmonary:     Effort: Pulmonary effort is normal. No respiratory distress.     Breath sounds: Normal breath sounds.  Abdominal:     Palpations: Abdomen is soft.     Tenderness: There is no abdominal tenderness. There is no guarding.  Musculoskeletal:        General: No swelling.     Cervical back: Normal range of motion and neck supple. No rigidity.     Right lower leg: No edema.     Left lower leg: No edema.     Comments: Lower extremities cool to touch.  Posterior tibial and dorsalis pedis pulses nonpalpable on exam.  Tender to palpation of medial and lateral joint line of right knee.  Limited range of motion secondary to pain.  Patient not able to ambulate secondary to pain.  Skin:    General: Skin is warm and dry.     Capillary Refill: Capillary refill takes less than 2 seconds.  Neurological:     Mental Status: He is alert.  Psychiatric:        Mood and Affect: Mood normal.     ED Results / Procedures / Treatments   Labs (all labs ordered are listed, but only abnormal results are displayed) Labs Reviewed  COMPREHENSIVE METABOLIC PANEL - Abnormal; Notable for the following components:      Result Value   Glucose, Bld 162 (*)    Creatinine, Ser 1.33 (*)    Albumin 3.4 (*)    Total Bilirubin 1.7 (*)    GFR, Estimated 54 (*)    All other components within normal limits  CBC WITH DIFFERENTIAL/PLATELET    EKG None  Radiology VAS [place] ABI WITH/WO TBI  Result Date: 04/27/2022  LOWER EXTREMITY DOPPLER STUDY Patient Name:  Sean Ward  Date of Exam:   04/27/2022 Medical Rec #: [id number]       Accession #:    [id number] Date of Birth: 1941/09/03       Patient Gender: M Patient Age:   55 years Exam Location:  [facility] Procedure:      VAS [place] ABI WITH/WO TBI Referring  Phys: Marius Betts --------------------------------------------------------------------------------  Indications: "PVD unspecified". High Risk Factors: No history of smoking. Other Factors: Afib, venous stasis.  Comparison Study: No previous exams - no attempt made to doppler pulses before                   ordering stat exam. Performing Technologist: Ernestene Mention RVT, RDMS  Examination Guidelines:  A complete evaluation includes at minimum, Doppler waveform signals and systolic blood pressure reading at the level of bilateral brachial, anterior tibial, and posterior tibial arteries, when vessel segments are accessible. Bilateral testing is considered an integral part of a complete examination. Photoelectric Plethysmograph (PPG) waveforms and toe systolic pressure readings are included as required and additional duplex testing as needed. Limited examinations for reoccurring indications may be performed as noted.  ABI Findings: +--------+------------------+-----+---------+--------+ Right   Rt Pressure (mmHg)IndexWaveform Comment  +--------+------------------+-----+---------+--------+ [id number]                    triphasic         +--------+------------------+-----+---------+--------+ PTA     172               1.18 triphasic         +--------+------------------+-----+---------+--------+ DP      161               1.10 biphasic          +--------+------------------+-----+---------+--------+ +--------+------------------+-----+---------+-------+ Left    Lt Pressure (mmHg)IndexWaveform Comment +--------+------------------+-----+---------+-------+ [id number]                    triphasic        +--------+------------------+-----+---------+-------+ PTA     190               1.30 triphasic        +--------+------------------+-----+---------+-------+ DP      142               0.97 biphasic         +--------+------------------+-----+---------+-------+  +-------+-----------+-----------+------------+------------+ ABI/TBIToday's ABIToday's TBIPrevious ABIPrevious TBI +-------+-----------+-----------+------------+------------+ Right  1.18                                           +-------+-----------+-----------+------------+------------+ Left   1.30                                           +-------+-----------+-----------+------------+------------+  Summary: Right: Resting right ankle-brachial index is within normal range. No evidence of significant right lower extremity arterial disease. Left: Resting left ankle-brachial index is within normal range. No evidence of significant left lower extremity arterial disease. *See table(s) above for measurements and observations.     Preliminary    DG Knee Complete 4 Views Right  Result Date: 04/27/2022 CLINICAL DATA:  Pain. EXAM: RIGHT KNEE - COMPLETE 4+ VIEW COMPARISON:  Right knee x-ray 03/30/2022 FINDINGS: No evidence of fracture, dislocation, or joint effusion. Soft tissues are within normal limits. There is moderate patellofemoral compartment joint space narrowing. There is tricompartmental osteophyte formation. There has been no significant interval change. IMPRESSION: 1. No acute fracture or malalignment. 2. 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 Electronically Signed   By: Ronney Asters M.D.   On: 04/27/2022 19:27    Procedures Procedures    Medications Ordered in ED Medications - No data to display  ED Course/ Medical Decision Making/ A&P                           Medical Decision Making Amount and/or Complexity of Data Reviewed Labs: ordered.   This patient presents to the ED for concern of SNF  placement, this involves an extensive number of treatment options, and is a complaint that carries with it a high risk of complications and morbidity.  The differential diagnosis includes SNF placement, osteoarthritis, compartment  syndrome, peripheral arterial disease, fracture, dislocation, strain/sprain, rheumatoid arthritis, gout   Co morbidities that complicate the patient evaluation  At bedtime V, Parkinson's disease, persistent atrial fibrillation, scoliosis with cervical thoracic spine, scoliosis of thoracolumbar spine, venous stasis, unsteady gait, dyslipidemia   Additional history obtained:  Additional history obtained from prior knee x-ray from 03/31/2019 given External records from outside source obtained and reviewed including no acute osseous abnormality.   Lab Tests:  I Ordered, and personally interpreted labs.  The pertinent results include: GFR 54 with creatinine of 1.33, BUN of 23   Imaging Studies ordered:  I ordered imaging studies including x-ray of right knee, ABI of bilateral lower extremities I independently visualized and interpreted imaging which showed  ABI: No acute abnormality X-ray knee: No acute abnormality.  Tricompartmental osteoarthritis. I agree with the radiologist interpretation   Consultations Obtained:  I requested consultation with the transition of care,  and discussed lab and imaging findings as well as pertinent plan.  Consultation pending upon shift change.   Problem List / ED Course / Critical interventions / Medication management  SNF placement Reevaluation of the patient showed that the patient improved I have reviewed the patients home medicines and have made adjustments as needed   Social Determinants of Health:  Denies tobacco, illicit drug use.   Test / Admission - Considered:  Knee pain/SNF placement Vitals signs within normal range and stable throughout visit. Laboratory/imaging studies significant for: No acute abnormality.  Patient's symptoms knee pain, most likely chronic in nature.  He describes his sole reason for coming the emergency department today is for SNF placement given his lack of direct care at his current living  facility. Transitional care was consulted in a timely manner, but consultation pending upon the shift change.  They will be back tomorrow morning to do so consultation request.  Care plan was described to patient, and he was understanding and was agreeable to said plan.  Patient was stable upon shift change.         Final Clinical Impression(s) / ED Diagnoses Final diagnoses:  Chronic pain of right knee    Rx / DC Orders ED Discharge Orders     None         Peter Garter, [place] 04/27/22 2203    Terrilee Files, MD 04/28/22 1045

## 2022-04-27 NOTE — Progress Notes (Signed)
ABI has been completed.  Preliminary results given to Sherian Maroon, PA-C.   Results can be found under chart review under CV PROC. 04/27/2022 8:33 PM Camara Renstrom RVT, RDMS

## 2022-04-28 NOTE — ED Notes (Signed)
Pt friend, Youlanda Roys, called and friend states she will be here shortly to pick up patient.

## 2022-04-28 NOTE — Discharge Instructions (Addendum)
Continue home medications as prescribed.  Apply for Medicaid for assistance with cost coverage of any future facility placement that you might need.  Return to the emergency department for any new or worsening symptoms of concern.

## 2022-04-28 NOTE — Progress Notes (Signed)
TOC has seen pt yesterday and explained to pt he is now in his copay days for SNF placement. Pt reported making 1499 per month, where most SNF copays are between 200 to 400 a day.   CSW spoke with pt via phone, pt stated he does not have the money to pay 200 to 400 a day. CSW explained to pt he does not have LTC insurance either and the hospital wouldn't be able to assist with placement. Pt then stated he was told by another agency that was helping him [name] was in his price range. CSW spoke with [facility] admission coordinator and was able to verify pt's income is not enough to cover SNF placement at [name].   CSW spoke with pt's landlord Sean Ward she stated pt's does not have a working bathroom. She reported pt was not forthcoming before he went to SNF, and she is in the process of fixing the bathroom. Pt's landlord stated she can assist with picking up pt's wheelchair and walker if needed.   Pt requested CSW to contact his ex-wife Sean Ward([phone]). CSW spoke with pt's Ex-wife, she reported she lives in [name], and is unable to come to pick pt up. She reported she was recently in a car accident and does not have transportation. She reported she tried to assist pt many times but he refused. Pt's ex-wife has agreed to find pt a hotel in [name] if he is able to receive transportation. CSW informed pt's ex-wife pt will need to apply for Medicaid to help assist with paying for his care. Pt's ex-wife has agreed to help pt with applying for Medicaid. CSW received permission to provide SSN to pt's wife to assist with pt's Medicaid application.     Pt's ex-wife has set pt up with a hotel close to her in [name] for a week. Pt's Landlord Sean Ward is willing to provide transportation at d/c.  Sean Ward.Sean Ward, MSW, LCSWA [facility]  Transitions of Care Clinical Social Worker I Direct Dial: [phone]  Fax: [phone] [email or web address]

## 2022-04-28 NOTE — ED Notes (Signed)
Pt speaking with Christina with TOC at this time.

## 2022-04-30 ENCOUNTER — Ambulatory Visit: Payer: Medicare Other | Admitting: Interventional Cardiology

## 2022-05-01 ENCOUNTER — Telehealth: Payer: Self-pay

## 2022-05-01 NOTE — Telephone Encounter (Signed)
I received a call back from Ms Caldwell/ Aging and Adult Services. She said that the patient called her 8-10 times since yesterday.  She explained that when she first spoke to the patient, she was not aware of his high level of need. She assists the DSS SW with placement of individuals into ALF, SNF and Family Care/Group Homes. She said that the patient has refused all of those placements and wanted a room of his own/ independent living.  Ms Elijah Birk said that he is on his own with that, she does not oversee independent living situations. She also noted that the patient has a " protective order" and a SW from APS will be contacting me.   Ms Elijah Birk further explained that the patient's income puts him over the limit for Medicaid, so SNF, ALF and LTC would not be options.   I inquired about CAP services ( with special assistance Medicaid ) if he has a permanent residence and would be safe alone if an aide is not present. She said that is a possibility but he will need to be evaluated by PCP first and FL2 completed. She said that the best option for the patient would be to go to rehab facility and then transfer to independent living with CAP services if he is in agreement.  Ms Elijah Birk said that she would be able to assist with finding a facility for the patient.She said I can contact her after patient's appointment with PCP.    I received a message from [facility] # [phone] requesting a call back.  I then called back and had to leave a message for her.

## 2022-05-01 NOTE — Telephone Encounter (Signed)
Follow up chart note from 04/30/2022 telephone call with patient. :   I called the patient and he is currently staying at the [facility]: $73/night.  He said he didn't go to [place] area.   He explained that he was discharged from [facility] because he could not afford the 20% co-pay for his stay after 20 days when the 100% coverage ran out.     He receives > $2000/month and does not qualify for Medicaid.  He explained that he spoke to Ms Elijah Birk at [facility] # [phone] about housing and she put him in contact with " Mr Lorin Picket."  The patient said that he spoke to Mr Lorin Picket and Mr Lorin Picket should be contacting him about permanent housing.  He was not sure exactly what type of housing but it sounds like a group home. The patient said he told Mr Lorin Picket that he needs assistance with ADLS and Mr Lorin Picket told him they can help him.   The patient said he is currently not ambulating and relies on a wheelchair for mobility. He is renting this wheelchair and said that a piece of the chair is broken. He has the contact information for the company and can call to report the problem.   When I asked about the timeline for moving he said he plans to check out of the motel tomorrow. And if need be go to another motel that is cheaper until his room is ready with Mr Lorin Picket.   I reminded him about his appointment with Dr Delford Field on 05/09/2022 and he said he was not sure how he will get there. I explained that his insurance company will provide transportation to appointments and I can assist with scheduling that. However he is not sure what address he will be at on 7/19.   I told him that I would contact Ms Elijah Birk to see if anything is needed to assist with securing housing.    05/01/2022: I called Ms Caldwell/Placement Coordinator -Aging and Adult Services.  Left voicemail message with call back requested.

## 2022-05-02 ENCOUNTER — Other Ambulatory Visit: Payer: Self-pay

## 2022-05-02 ENCOUNTER — Emergency Department (HOSPITAL_COMMUNITY)
Admission: EM | Admit: 2022-05-02 | Discharge: 2022-05-02 | Disposition: A | Discharge status: Home / Self Care | Payer: Medicare Other | Attending: Emergency Medicine | Admitting: Emergency Medicine

## 2022-05-02 ENCOUNTER — Encounter (HOSPITAL_COMMUNITY): Payer: Self-pay

## 2022-05-02 DIAGNOSIS — Z7901 Long term (current) use of anticoagulants: Secondary | ICD-10-CM | POA: Diagnosis not present

## 2022-05-02 DIAGNOSIS — R531 Weakness: Secondary | ICD-10-CM | POA: Insufficient documentation

## 2022-05-02 DIAGNOSIS — M7989 Other specified soft tissue disorders: Secondary | ICD-10-CM | POA: Diagnosis present

## 2022-05-02 DIAGNOSIS — I4891 Unspecified atrial fibrillation: Secondary | ICD-10-CM | POA: Insufficient documentation

## 2022-05-02 DIAGNOSIS — R600 Localized edema: Secondary | ICD-10-CM | POA: Insufficient documentation

## 2022-05-02 DIAGNOSIS — G20 Parkinson's disease: Secondary | ICD-10-CM | POA: Diagnosis not present

## 2022-05-02 DIAGNOSIS — R609 Edema, unspecified: Secondary | ICD-10-CM

## 2022-05-02 LAB — CBC WITH DIFFERENTIAL/PLATELET
Abs Immature Granulocytes: 0.03 10*3/uL (ref 0.00–0.07)
Basophils Absolute: 0 10*3/uL (ref 0.0–0.1)
Basophils Relative: 0 %
Eosinophils Absolute: 0.1 10*3/uL (ref 0.0–0.5)
Eosinophils Relative: 2 %
HCT: 44.3 % (ref 39.0–52.0)
Hemoglobin: 14.5 g/dL (ref 13.0–17.0)
Immature Granulocytes: 0 %
Lymphocytes Relative: 17 %
Lymphs Abs: 1.3 10*3/uL (ref 0.7–4.0)
MCH: 30.3 pg (ref 26.0–34.0)
MCHC: 32.7 g/dL (ref 30.0–36.0)
MCV: 92.5 fL (ref 80.0–100.0)
Monocytes Absolute: 0.9 10*3/uL (ref 0.1–1.0)
Monocytes Relative: 12 %
Neutro Abs: 5.3 10*3/uL (ref 1.7–7.7)
Neutrophils Relative %: 69 %
Platelets: 208 10*3/uL (ref 150–400)
RBC: 4.79 MIL/uL (ref 4.22–5.81)
RDW: 15.1 % (ref 11.5–15.5)
WBC: 7.6 10*3/uL (ref 4.0–10.5)
nRBC: 0 % (ref 0.0–0.2)

## 2022-05-02 LAB — BASIC METABOLIC PANEL
Anion gap: 12 (ref 5–15)
BUN: 25 mg/dL — ABNORMAL HIGH (ref 8–23)
CO2: 20 mmol/L — ABNORMAL LOW (ref 22–32)
Calcium: 8.8 mg/dL — ABNORMAL LOW (ref 8.9–10.3)
Chloride: 106 mmol/L (ref 98–111)
Creatinine, Ser: 1.21 mg/dL (ref 0.61–1.24)
GFR, Estimated: >60 mL/min (ref >60)
Glucose, Bld: 90 mg/dL (ref 70–99)
Potassium: 5 mmol/L (ref 3.5–5.1)
Sodium: 138 mmol/L (ref 135–145)

## 2022-05-02 MED ORDER — CARBIDOPA-LEVODOPA ER 50-200 MG PO TBCR
1.0000 | EXTENDED_RELEASE_TABLET | Freq: Every day | ORAL | Status: DC
  Administered 2022-05-02: 1 via ORAL
  Filled 2022-05-02: qty 1

## 2022-05-02 MED ORDER — ACETAMINOPHEN 500 MG PO TABS: 1000.0000 mg | ORAL_TABLET | Freq: Four times a day (QID) | ORAL | Status: DC | PRN

## 2022-05-02 MED ORDER — PROPRANOLOL HCL ER 60 MG PO CP24: 60.0000 mg | ORAL_CAPSULE | Freq: Every day | ORAL | Status: DC

## 2022-05-02 MED ORDER — MELATONIN 5 MG PO TABS
5.0000 mg | ORAL_TABLET | Freq: Every day | ORAL | Status: DC
  Administered 2022-05-02: 5 mg via ORAL
  Filled 2022-05-02: qty 1

## 2022-05-02 MED ORDER — APIXABAN 5 MG PO TABS
5.0000 mg | ORAL_TABLET | Freq: Two times a day (BID) | ORAL | Status: DC
  Administered 2022-05-02: 5 mg via ORAL
  Filled 2022-05-02: qty 1

## 2022-05-02 MED ORDER — CARBIDOPA-LEVODOPA 25-100 MG PO TABS
1.5000 | ORAL_TABLET | Freq: Every day | ORAL | Status: DC
  Administered 2022-05-02: 1.5 via ORAL
  Filled 2022-05-02: qty 2

## 2022-05-02 MED ORDER — LATANOPROST 0.005 % OP SOLN
1.0000 [drp] | Freq: Every day | OPHTHALMIC | Status: DC
  Administered 2022-05-02: 1 [drp] via OPHTHALMIC
  Filled 2022-05-02: qty 2.5

## 2022-05-02 MED ORDER — CARBIDOPA-LEVODOPA 25-100 MG PO TABS: 1.5000 | ORAL_TABLET | ORAL | Status: DC

## 2022-05-02 NOTE — ED Notes (Signed)
Pt refusing to leave with PTAR. Consulting civil engineer and Social Work notified.

## 2022-05-02 NOTE — ED Triage Notes (Signed)
Recently released from rehab to home because he could not afford to pay for facility was released to a hotel from the hotel found a boarding house stayed just last night and they called ems to bring him here because he wants long term care as he cannot do basics ADL. He had home health set up from rehab and a Child psychotherapist.  However home health could not ocntinue because pt has no family to help assist him during the day. SW is starting the process for SNF.  Pt c/o of BLE leg and feet swelling

## 2022-05-02 NOTE — Telephone Encounter (Signed)
Call received from [facility]/APS. She explained that they received a referral for the patient on 04/27/2022.  She is in the process of obtaining information from providers to determine how she can best assist him.  She is aware that he has an appointment with Dr Delford Field- 05/09/2022 and will fax a request for medical records to this clinic.

## 2022-05-02 NOTE — Progress Notes (Addendum)
Transition of Care [facility]) - Emergency Department Mini Assessment   Patient Details  Name: Sean Ward MRN: [id number] Date of Birth: 21-Jan-1941  Transition of Care [facility]) CM/SW Contact:    Keontre Defino C Tarpley-Carter, LCSWA Phone Number: 05/02/2022, 9:39 PM   Clinical Narrative: [facility] CSW consulted with pt in regards to LTC.  Pt is currently working with SW at [employer] [phone] on receiving Medicaid.  She will also assist with placement at a LTC.  Pt has agreed to return to boarding house with Scott [phone] at [address] [place], [place] [address].    Jawanda Passey Tarpley-Carter, MSW, LCSW-A Pronouns:  She/Her/Hers [facility] Care Clinical Social Worker Direct Number:  [phone] [email or web address]    ED Mini Assessment: What brought you to the Emergency Department? : SNF Placement  Barriers to Discharge: No Barriers Identified     Means of departure: Ambulance  Interventions which prevented an admission or readmission: SNF Placement    Patient Contact and Communications Key Contact 1: Scott     Contact Date: 05/02/22,     Contact Phone Number: [phone] Call outcome: Lorin Picket did not answer and voicemail is full.  Patient states their goals for this hospitalization and ongoing recovery are:: To get assistance with LTC. 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, [address] [place] [address] Phone: [phone] Fax: [phone]

## 2022-05-02 NOTE — ED Notes (Signed)
Pt agreeing to be discharged with PTAR.

## 2022-05-02 NOTE — Progress Notes (Signed)
TOC CSW contacted Scott [phone] at boarding house ([address] [place], [place] [address]).  Lorin Picket is awaiting pts return.  Lavert Matousek Tarpley-Carter, MSW, LCSW-A Pronouns:  She/Her/Hers [facility] of Care Clinical Social Worker Direct Number:  [phone] [email or web address]

## 2022-05-02 NOTE — ED Provider Notes (Signed)
[facility] EMERGENCY DEPARTMENT Provider Note   CSN: [id number] Arrival date & time: 05/02/22  1721     History  Chief Complaint  Patient presents with   Leg Swelling    Sean Ward is a 81 y.o. male.  The history is provided by the patient, medical records and the EMS personnel. No language interpreter was used.    81 year old male with significant history of Parkinson disease, A-fib on Eliquis, chronic back pain, presenting to the via EMS in seek for long-term care.  Per triage note, patient was recently released from rehab to home however because he could not afford to pay the facility he was released to hotel which then transfer the patient to a boardinghouse.  Patient stay at the boardinghouse last night but EMS was called to bring him here because he wants long-term care as he cannot perform ADL.  Apparently he also has home health set up for rehab and have a Child psychotherapist but because patient has no family to help assist him in the day, social work is currently working on finding a skilled nursing facility for patient.  His current complaint is swelling to his legs bilaterally.  He noticed swelling today.  He denies any significant pain.  Denies chest pain or trouble breathing.  In essence patient report he has Parkinson's, fell and hurt his knee several months prior and when he got home in his apartment which is on the second floor, he was so weak that he could not get off from the floor and was on the ground for approximately 3 days without food or water.  His neighbor was concerned and did a well check call when he was found and subsequently brought to the hospital to be admitted.  He was going through rehab but only was able to stay for 2 weeks and when his money ran out, he was discharged.  Since then, patient has tried to find a place to stay which includes at hotels but he is having great difficulty performing his ADL or walking due to lack of strength to  his legs.  He is here seeking for help.  Home Medications Prior to Admission medications   Medication Sig Start Date End Date Taking? Authorizing Provider  acetaminophen (TYLENOL) 500 MG tablet Take 1,000 mg by mouth every 6 (six) hours as needed for moderate pain.    [provider]  apixaban (ELIQUIS) 5 MG TABS tablet TAKE 1 TABLET (5 MG TOTAL) BY MOUTH 2 (TWO) TIMES DAILY. Patient taking differently: Take 5 mg by mouth 2 (two) times daily. 09/11/21 09/11/22  [name], MD  BACTRIM DS 800-160 MG tablet Take 1 tablet by mouth 2 (two) times daily. 04/25/22   [provider]  carbidopa-levodopa (SINEMET CR) 50-200 MG tablet TAKE 1 TABLET BY MOUTH AT BEDTIME. Patient not taking: Reported on 04/28/2022 05/10/21 05/10/22  [name], MD  carbidopa-levodopa (SINEMET IR) 25-100 MG tablet TAKE 1 & 1/2 TABLETS BY  MOUTH 5 TIMES A DAY, AT 8 AM, 11 AM, 2 PM, 5 PM AND 8 PM. Patient taking differently: Take 1.5 tablets by mouth See admin instructions. Takes 5 times a day 05/10/21 05/10/22  [name], MD  latanoprost (XALATAN) 0.005 % ophthalmic solution APPLY 1 DROP TO EYE NIGHTLY. Patient taking differently: Place 1 drop into both eyes at bedtime. 05/10/21 05/10/22  [name], MD  MELATONIN MAXIMUM STRENGTH 5 MG TABS Take 5 mg by mouth at bedtime. 04/11/22  [provider]  Multiple Vitamin (MULTIVITAMIN) tablet Take 1 tablet by mouth daily.    [provider]  propranolol ER (INDERAL LA) 60 MG 24 hr capsule TAKE 1 CAPSULE (60 MG TOTAL) BY MOUTH DAILY. Patient taking differently: Take 60 mg by mouth daily. 05/10/21 05/10/22  [name], MD      Allergies    Patient has no known allergies.    Review of Systems   Review of Systems  All other systems reviewed and are negative.   Physical Exam Updated Vital Signs BP 113/76   Pulse 80   Temp 97.8 F (36.6 C)   Resp 16   Ht 5\' 9"  (1.753 m)   Wt 74.2 kg   SpO2 96%   BMI 24.16 kg/m   Physical Exam Vitals and nursing note reviewed.  Constitutional:      General: He is not in acute distress.    Appearance: He is well-developed.  HENT:     Head: Atraumatic.  Eyes:     Conjunctiva/sclera: Conjunctivae normal.  Cardiovascular:     Rate and Rhythm: Rhythm irregular.     Pulses: Normal pulses.     Heart sounds: Normal heart sounds.  Pulmonary:     Breath sounds: Normal breath sounds.  Abdominal:     Palpations: Abdomen is soft.     Tenderness: There is no abdominal tenderness.  Musculoskeletal:     Cervical back: Neck supple.     Comments: Global weakness with essential tremors.  No significant edema noted to bilateral lower extremities no signs of trauma.  Skin:    Findings: No rash.  Neurological:     Mental Status: He is alert.     ED Results / Procedures / Treatments   Labs (all labs ordered are listed, but only abnormal results are displayed) Labs Reviewed  BASIC METABOLIC PANEL - Abnormal; Notable for the following components:      Result Value   CO2 20 (*)    BUN 25 (*)    Calcium 8.8 (*)    All other components within normal limits  CBC WITH DIFFERENTIAL/PLATELET    EKG None  Radiology No results found.  Procedures Procedures    Medications Ordered in ED Medications  apixaban (ELIQUIS) tablet 5 mg (5 mg Oral Given 05/02/22 2101)  carbidopa-levodopa (SINEMET CR) 50-200 MG per tablet controlled release 1 tablet (1 tablet Oral Given 05/02/22 2101)  melatonin tablet 5 mg (5 mg Oral Given 05/02/22 2101)  propranolol ER (INDERAL LA) 24 hr capsule 60 mg (has no administration in time range)  latanoprost (XALATAN) 0.005 % ophthalmic solution 1 drop (1 drop Both Eyes Given 05/02/22 2151)  acetaminophen (TYLENOL) tablet 1,000 mg (has no administration in time range)  carbidopa-levodopa (SINEMET IR) 25-100 MG per tablet immediate release 1.5 tablet (1.5 tablets Oral Given 05/02/22 1957)    ED Course/ Medical Decision Making/ A&P                            Medical Decision Making Amount and/or Complexity of Data Reviewed Labs: ordered.  Risk OTC drugs. Prescription drug management.   BP 113/76   Pulse 80   Temp 97.8 F (36.6 C)   Resp 16   Ht 5\' 9"  (1.753 m)   Wt 74.2 kg   SpO2 96%   BMI 24.16 kg/m   6:15 PM This is a 81 year old male with history of Parkinson disease which greatly affected his  ability to ambulate.  He had a fall about a month ago and subsequently was found inside his house on the ground for approximately 3 days without food or water because he was unable to get up without assistance.  He was admitted and subsequently sent to [facility] rehab facility.  He was there for approximately 2 weeks but due to lack of funding, patient was subsequently released from the rehab facility.  Since then he has been trying to find a place to stay and has been staying at different hotels but due to inability to care for himself, patient was sent back to the ER he need for further assistance.  I have reviewed patient's EMR and considered my plan of care.  Patient has had social worker involvement in his needs for social assistance.  They are having a difficult time finding a placement for him because he does not qualify for Medicaid, assisted living facility, or skilled nursing facility at this time.  However, patient does not have the ability to care for himself appropriately and therefore I will request transition of care to be involved to help with placement.  Also endorsed having some bilateral feet swelling.  On exam, minimal swelling noted to both feet and no evidence to suggest DVT or cellulitis.  He is not on any diuretic.  We will check basic labs.  I have ordered patient home medications.  Care discussed with Dr. Karene FryLawsing.   10:22 PM Labs obtained independently viewed interpreted by me.  Labs are reassuring, no electrolyte derangement.  I have requested for our social worker to evaluate patient.  They have talked the patient  extensively and at this time patient does not qualify for outpatient assisted living facility skilled nursing facility.  However [facility]  Our social worker has also reach out and talk to the boarding manager who is willing to accept patient back to the facility.  Will help with transportation back to facility.  As mentioned earlier his complaints of peripheral edema without any obvious physical finding.  I recommend patient keep legs elevated but otherwise no signs to suggest CHF exacerbation, cellulitis, DVT, or other concerning acute feature.  Patient is stable for discharge.  Resources provided.  I have reviewed patient's prior EMR and considered my plan of care  BP 112/72 (BP Location: Right Arm)   Pulse 78   Temp 97.8 F (36.6 C)   Resp 15   Ht 5\' 9"  (1.753 m)   Wt 74.2 kg   SpO2 97%   BMI 24.16 kg/m   Results for orders placed or performed during the hospital encounter of 05/02/22  Basic metabolic panel  Result Value Ref Range   Sodium 138 135 - 145 mmol/L   Potassium 5.0 3.5 - 5.1 mmol/L   Chloride 106 98 - 111 mmol/L   CO2 20 (L) 22 - 32 mmol/L   Glucose, Bld 90 70 - 99 mg/dL   BUN 25 (H) 8 - 23 mg/dL   Creatinine, Ser 8.461.21 0.61 - 1.24 mg/dL   Calcium 8.8 (L) 8.9 - 10.3 mg/dL   GFR, Estimated >96>60 >29>60 mL/min   Anion gap 12 5 - 15  CBC with Differential  Result Value Ref Range   WBC 7.6 4.0 - 10.5 K/uL   RBC 4.79 4.22 - 5.81 MIL/uL   Hemoglobin 14.5 13.0 - 17.0 g/dL   HCT 52.844.3 41.339.0 - 24.452.0 %   MCV 92.5 80.0 - 100.0 fL  MCH 30.3 26.0 - 34.0 pg   MCHC 32.7 30.0 - 36.0 g/dL   RDW 63.7 85.8 - 85.0 %   Platelets 208 150 - 400 K/uL   nRBC 0.0 0.0 - 0.2 %   Neutrophils Relative % 69 %   Neutro Abs 5.3 1.7 - 7.7 K/uL   Lymphocytes Relative 17 %   Lymphs Abs 1.3 0.7 - 4.0 K/uL   Monocytes Relative 12 %   Monocytes Absolute 0.9 0.1 - 1.0 K/uL   Eosinophils Relative 2 %   Eosinophils Absolute 0.1 0.0 - 0.5 K/uL   Basophils Relative 0 %    Basophils Absolute 0.0 0.0 - 0.1 K/uL   Immature Granulocytes 0 %   Abs Immature Granulocytes 0.03 0.00 - 0.07 K/uL   VAS [place] ABI WITH/WO TBI  Result Date: 04/28/2022  LOWER EXTREMITY DOPPLER STUDY Patient Name:  LARON BOORMAN  Date of Exam:   04/27/2022 Medical Rec #: [id number]       Accession #:    [id number] Date of Birth: [birth year]       Patient Gender: M Patient Age:   83 years Exam Location:  [facility] Procedure:      VAS [place] ABI WITH/WO TBI Referring Phys: COOPER ROBBINS --------------------------------------------------------------------------------  Indications: "PVD unspecified". High Risk Factors: No history of smoking. Other Factors: Afib, venous stasis.  Comparison Study: No previous exams - no attempt made to doppler pulses before                   ordering stat exam. Performing Technologist: Ernestene Mention RVT, RDMS  Examination Guidelines: A complete evaluation includes at minimum, Doppler waveform signals and systolic blood pressure reading at the level of bilateral brachial, anterior tibial, and posterior tibial arteries, when vessel segments are accessible. Bilateral testing is considered an integral part of a complete examination. Photoelectric Plethysmograph (PPG) waveforms and toe systolic pressure readings are included as required and additional duplex testing as needed. Limited examinations for reoccurring indications may be performed as noted.  ABI Findings: +--------+------------------+-----+---------+--------+ Right   Rt Pressure (mmHg)IndexWaveform Comment  +--------+------------------+-----+---------+--------+ [id number]                    triphasic         +--------+------------------+-----+---------+--------+ PTA     172               1.18 triphasic         +--------+------------------+-----+---------+--------+ DP      161               1.10 biphasic          +--------+------------------+-----+---------+--------+  +--------+------------------+-----+---------+-------+ Left    Lt Pressure (mmHg)IndexWaveform Comment +--------+------------------+-----+---------+-------+ [id number]                    triphasic        +--------+------------------+-----+---------+-------+ PTA     190               1.30 triphasic        +--------+------------------+-----+---------+-------+ DP      142               0.97 biphasic         +--------+------------------+-----+---------+-------+ +-------+-----------+-----------+------------+------------+ ABI/TBIToday's ABIToday's TBIPrevious ABIPrevious TBI +-------+-----------+-----------+------------+------------+ Right  1.18                                           +-------+-----------+-----------+------------+------------+  Left   1.30                                           +-------+-----------+-----------+------------+------------+  Summary: Right: Resting right ankle-brachial index is within normal range. No evidence of significant right lower extremity arterial disease. Left: Resting left ankle-brachial index is within normal range. No evidence of significant left lower extremity arterial disease. *See table(s) above for measurements and observations.  Electronically signed by Gerarda Fraction on 04/28/2022 at 11:20:57 AM.    Final    DG Knee Complete 4 Views Right  Result Date: 04/27/2022 CLINICAL DATA:  Pain. EXAM: RIGHT KNEE - COMPLETE 4+ VIEW COMPARISON:  Right knee x-ray 03/30/2022 FINDINGS: No evidence of fracture, dislocation, or joint effusion. Soft tissues are within normal limits. There is moderate patellofemoral compartment joint space narrowing. There is tricompartmental osteophyte formation. There has been no significant interval change. IMPRESSION: 1. No acute fracture or malalignment. 2. 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 Electronically Signed   By: Darliss Cheney M.D.   On: 04/27/2022  19:27            Final Clinical Impression(s) / ED Diagnoses Final diagnoses:  Peripheral edema    Rx / DC Orders ED Discharge Orders     None         Fayrene Helper, PA-C 05/02/22 2225    Ernie Avena, MD 05/02/22 2348

## 2022-05-02 NOTE — Discharge Instructions (Signed)
You may keep your legs elevated to decrease swelling.  Please use resource below to find placement for available living facility

## 2022-05-03 NOTE — Telephone Encounter (Signed)
Copied from CRM [id number]. Topic: General - Inquiry >> May 03, 2022  3:45 PM Marlow Baars wrote: Reason for CRM: Patient called in stating he missed a call from the provider and not sure what it is about. Please assist patient further.

## 2022-05-04 NOTE — Telephone Encounter (Signed)
I called [facility] Medicare transportation  # [phone] and spoke to [place].  3 business days are required to arrange a ride and he will need wheelchair transport. Roddie Mc then said his account is inactive, so they are unable to schedule a ride   I tried calling the patient again and mailbox is full, unable to leave a message

## 2022-05-04 NOTE — Telephone Encounter (Signed)
I attempted to contact the patient [phone] to confirm his address and arrange transportation to his appointment at [facility] next week. Voicemail was full, unable to leave a message.  I need to confirm that he needs wheelchair transport.  His insurance company will need a 2-3 day notice to schedule the ride.

## 2022-05-07 ENCOUNTER — Telehealth: Payer: Self-pay | Admitting: Emergency Medicine

## 2022-05-07 NOTE — Progress Notes (Signed)
Patient needs wheelchair transport. I contacted his insurance company about transportation and was informed that he is not enrolled. His appointment at [facility] is in 2 days. Sean Ward/Transportation services - [employer] arranged wheelchair transport for the appointment.

## 2022-05-07 NOTE — Telephone Encounter (Signed)
Pt is calling Erskine Squibb back regarding medical transportation. Cb- [phone]

## 2022-05-07 NOTE — Telephone Encounter (Signed)
Copied from CRM [id number]. Topic: General - Inquiry >> May 07, 2022 12:09 PM Marlow Baars wrote: Reason for CRM: Patient called in stating he wanted to leave a message with Robyne Peers nurse case manager. He has an appointment with Dr Delford Field on Wednesday July 19 but is in a wheelchair and has transportation issues. Please assist patient further.

## 2022-05-07 NOTE — Telephone Encounter (Signed)
Message received from [facility] [employer] stating that transportation has been arranged for Sean Ward.  They will give [name] a call with the pickup details and to confirm any accommodations (they know he is in a wheelchair).    I called the patient and shared the above information about his ride and reminded him that he needs to be in the wheelchair and able to get out of his room when the driver arrives.  The driver is not permitted to enter his residence.  The patient stated that he understood and also said he would like to complete an [employer] [place] application when he is at the office for his app.

## 2022-05-07 NOTE — Telephone Encounter (Signed)
I spoke to the patient and it is documented in another telephone encounter today

## 2022-05-07 NOTE — Telephone Encounter (Signed)
I spoke to the patient this morning and he was not aware that his account with [facility] [employer] transportation is inactive. He still needs transportation to [facility] for his appointment  - 05/09/2022 @ 1100. He said he will be able to transfer from bed to wheelchair and get out of the room. If needed he will get someone to assist.  I explained to him that the wheelchair transportation driver will not be able to come into his room to assist him. He must be able to get out of his room and he said he understood.   I contacted [facility] [employer] and they will arrange wheelchair transport for patient to his appointment on 05/09/2022.

## 2022-05-09 ENCOUNTER — Ambulatory Visit: Payer: Medicare Other | Attending: Critical Care Medicine | Admitting: Critical Care Medicine

## 2022-05-09 ENCOUNTER — Ambulatory Visit (HOSPITAL_BASED_OUTPATIENT_CLINIC_OR_DEPARTMENT_OTHER): Payer: Medicare Other | Admitting: Critical Care Medicine

## 2022-05-09 ENCOUNTER — Encounter: Payer: Self-pay | Admitting: Critical Care Medicine

## 2022-05-09 DIAGNOSIS — E785 Hyperlipidemia, unspecified: Secondary | ICD-10-CM

## 2022-05-09 DIAGNOSIS — E860 Dehydration: Secondary | ICD-10-CM

## 2022-05-09 DIAGNOSIS — G20 Parkinson's disease: Secondary | ICD-10-CM

## 2022-05-09 DIAGNOSIS — I480 Paroxysmal atrial fibrillation: Secondary | ICD-10-CM

## 2022-05-09 DIAGNOSIS — M4126 Other idiopathic scoliosis, lumbar region: Secondary | ICD-10-CM

## 2022-05-09 DIAGNOSIS — L89132 Pressure ulcer of right lower back, stage 2: Secondary | ICD-10-CM

## 2022-05-09 DIAGNOSIS — R2681 Unsteadiness on feet: Secondary | ICD-10-CM

## 2022-05-09 DIAGNOSIS — M1711 Unilateral primary osteoarthritis, right knee: Secondary | ICD-10-CM

## 2022-05-09 DIAGNOSIS — M6282 Rhabdomyolysis: Secondary | ICD-10-CM | POA: Diagnosis not present

## 2022-05-09 DIAGNOSIS — R251 Tremor, unspecified: Secondary | ICD-10-CM

## 2022-05-09 NOTE — Assessment & Plan Note (Signed)
Based on his history over the phone his rhabdomyolysis has resolved but he will need follow-up labs

## 2022-05-09 NOTE — Telephone Encounter (Signed)
Dr Delford Field - I will complete the [facility] tonight and send to you to sign/ print.     I left a message for Ms Arias/APS # [phone]  to let her know that the [facility] will be done and we will need her assistance with SNF/ rehab placement and will need her fax number.  Awaiting a call back.

## 2022-05-09 NOTE — Assessment & Plan Note (Signed)
Currently not taking statin therapy

## 2022-05-09 NOTE — Assessment & Plan Note (Addendum)
The patient is a very high fall risk he is fallen at least 5 times over the past 2 months and is driven by his severe parkinsonism his imbalance and his associated arthritis of the spine and right lower extremity  Given his high risk he will need placement in long-term care I will process an FL 2 and communicate with APS

## 2022-05-09 NOTE — Assessment & Plan Note (Signed)
This will be needed reassessment I am concerned about this as he has been sitting in the wheelchair constantly

## 2022-05-09 NOTE — Assessment & Plan Note (Signed)
Back pain is not a major issue it is the arthritis in the right knee that is the biggest issue at this time

## 2022-05-09 NOTE — Progress Notes (Deleted)
   Established Patient Office Visit  Subjective   Patient ID: Sean Ward, male    DOB: 01-Feb-1941  Age: 81 y.o. MRN: [id number]  No chief complaint on file.   Not seen since 06/2021  Admit date: 03/30/2022 Discharge date: 04/03/2022   Admitted From: Home Disposition: Skilled nursing facility   Recommendations for Outpatient Follow-up:  1. Follow up with PCP in 1-2 weeks 2. Schedule follow-up with orthopedics after discharge from rehab   Home Health: N/A Equipment/Devices: N/A   Discharge Condition: Stable CODE STATUS: Full code Diet recommendation: Low-salt diet   Discharge summary: 81 year old with history of Parkinson's disease, paroxysmal A-fib on Eliquis, severe right knee arthritis fell on the floor of his apartment and could not get up for 4 days, kept yelling out for help and finally someone heard him after 4 days and called EMS and brought to ER.  Denied any trauma but he had some bruise on the right hip area. In the emergency room hemodynamically stable.  Sodium 147.  Creatinine kinase 2700.  A skeletal survey negative. Treated in the hospital.  He remained in the hospital waiting for SNF availability.    Assessment & Plan:   Traumatic rhabdomyolysis, fall, ambulatory dysfunction due to severe right knee pain, moderate dehydration and hyponatremia: -Treated with IV fluids with clinical improvement.  Encourage oral intake.  Adequately improved. -PT OT, fall precautions, refer for skilled nursing facility for inpatient therapies. -Unable to get up because of right knee arthritis, if he does go to rehab, he may be able to get right knee arthroplasty and improve his mobility.  He is following up with Dr. Lajoyce Corners.   Paroxysmal A-fib: No evidence of injury or bleeding.  Rate controlled in sinus rhythm on propanolol.  Continue Eliquis.   Parkinson's disease: Fairly controlled on Sinemet.  Continue as he takes at home.  Patient takes Sinemet 5 times a day.    Hypokalemia/hypophosphatemia: Replaced and adequate.   Stable to discharge to SNF level of care.   Discharge Diagnoses:  Principal Problem:   Rhabdomyolysis Active Problems:   Atrial fibrillation (HCC)   Parkinson disease (HCC)   Dehydration   Pressure injury of skin    {History (Optional):23778}  ROS    Objective:     There were no vitals taken for this visit. {Vitals History (Optional):23777}  Physical Exam   No results found for any visits on 05/09/22.  {Labs (Optional):23779}  The 10-year ASCVD risk score (Arnett DK, et al., 2019) is: 26.5%    Assessment & Plan:   Problem List Items Addressed This Visit   None   No follow-ups on file.    Shan Levans, MD

## 2022-05-09 NOTE — Progress Notes (Signed)
Established Patient Office Visit  Subjective   Patient ID: Sean Ward, male    DOB: 23-May-1941  Age: 81 y.o. MRN: [id number] Virtual Visit via Telephone Note  I connected with Sean Ward on 05/09/22 at 11:00 AM EDT by telephone and verified that I am speaking with the correct person using two identifiers.   Consent:  I discussed the limitations, risks, security and privacy concerns of performing an evaluation and management service by telephone and the availability of in person appointments. I also discussed with the patient that there may be a patient responsible charge related to this service. The patient expressed understanding and agreed to proceed.  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 This patient was recently found after 4 days lying on the floor in his home with severe rhabdomyolysis and other electrolyte disturbances.  The patient was taken to the hospital in early June and discharge summary is as documented below  Patient initially was placed at [facility] and he stayed there about 28 days and ran out of his days on his Medicare plan.  He thinks he has [place] healthcare Medicare advantage but he may have only red[place]white and blue Medicare.  The patient was discharged to a rooming house he cannot stay there long he went to the ER several times and eventually went and was placed at a motel at [place].  He is in room 117 at the [place]  The patient was to see me today for posthospital visit after multiple ER visits and his hospital stay in June.  Unfortunately the patient could not  get out of his room and meet the [place] lift driver at the front entrance of the hotel.  He essentially is wheelchair-bound and his hotel room at this time.  He was staying in the bed and had para medicine coming and putting him in the wheelchair during the day but they no longer provide this service.  The hotel individuals are helping him with food access.  Essentially the patient's been sleeping in his wheelchair for the past 3 nights.  Situations becoming quite desperate as he is starting to run out of medications.  Patient's not really in any pain but he is having to underdose his Sinemet he is supposed to be on 1-1/2 25/100 Sinemet 5 times during the day and then he takes the 50/200 dose at bedtime.  He has only been getting the single 25/100 dose of Sinemet and is underdosing.  His parkinsonism has significantly worsened.  He has significant tremor.  He is able to swallow food and liquids.  He has tramadol as needed for pain but is not in any pain right now as long as he does not weight-bear on his right leg.  He also has Tylenol to take as well.  He has methocarbamol for muscle relaxation he is not needing.  Patient has omeprazole for gastric reflux but takes it only as needed.  The patient's not been taking his propranolol long-acting and his heart rates been in the 1 10-1 20 range according to the patient.  He has been taking his Eliquis for his atrial  fibrillation but only has about 3 days doses left.  The patient denies any other complaints at this time.  He has a ex-wife who lives in [place] and 2 children but they are not available to him.  Adult Protective Services has been notified as well as the [facility] for aging and adult care.  They are trying to help the patient in this regard.  The patient would like to not go back to [facility].  He does have some money in the bank and he is not without resources.  He is also willing to pay for a personal care services out of his own  pocket.  He clearly is not thriving at the motel room at this time.   Below is a copy of the discharge summary Admit date: 03/30/2022 Discharge date: 04/03/2022   Admitted From: Home Disposition: Skilled nursing facility   Recommendations for Outpatient Follow-up:  Follow up with PCP in 1-2 weeks Schedule follow-up with orthopedics after discharge from rehab   Home Health: N/A Equipment/Devices: N/A   Discharge Condition: Stable CODE STATUS: Full code Diet recommendation: Low-salt diet   Discharge summary: 81 year old with history of Parkinson's disease, paroxysmal A-fib on Eliquis, severe right knee arthritis fell on the floor of his apartment and could not get up for 4 days, kept yelling out for help and finally someone heard him after 4 days and called EMS and brought to ER.  Denied any trauma but he had some bruise on the right hip area. In the emergency room hemodynamically stable.  Sodium 147.  Creatinine kinase 2700.  A skeletal survey negative. Treated in the hospital.  He remained in the hospital waiting for SNF availability.    Assessment & Plan:   Traumatic rhabdomyolysis, fall, ambulatory dysfunction due to severe right knee pain, moderate dehydration and hyponatremia: -Treated with IV fluids with clinical improvement.  Encourage oral intake.  Adequately improved. -PT OT, fall precautions, refer for skilled nursing facility for inpatient therapies. -Unable to get up because of right knee arthritis, if he does go to rehab, he may be able to get right knee arthroplasty and improve his mobility.  He is following up with Dr. Sharol Given.   Paroxysmal A-fib: No evidence of injury or bleeding.  Rate controlled in sinus rhythm on propanolol.  Continue Eliquis.   Parkinson's disease: Fairly controlled on Sinemet.  Continue as he takes at home.  Patient takes Sinemet 5 times a day.   Hypokalemia/hypophosphatemia: Replaced and adequate.   Stable to discharge to SNF level of care.    Discharge Diagnoses:  Principal Problem:   Rhabdomyolysis Active Problems:   Atrial fibrillation ([place])   Parkinson disease ([place])   Dehydration   Pressure injury of skin   Patient Active Problem List   Diagnosis Date Noted   Pressure injury of skin 04/01/2022   Rhabdomyolysis 03/30/2022   Dehydration 03/30/2022   Psoriasis 11/13/2021   Dermatochalasis of both upper eyelids 08/04/2021   Neck pain 05/10/2021   Other idiopathic scoliosis, 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   Lumbar back pain 05/10/2021   Parkinson disease ([place]) 02/28/2018   Primary osteoarthritis of right knee 08/16/2016   Dyslipidemia 08/16/2016   Unsteady gait 05/24/2016   Atrial fibrillation ([place]) 10/27/2014   Onychomycosis 05/03/2014   Tremor 04/08/2014   Venous stasis 03/25/2014   Past Medical History:  Diagnosis Date   Anticoagulant long-term use    Back pain with radiation    Hernia, inguinal, left 05/03/2014   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   History of bronchitis    Inguinal hernia, right 04/08/2014   Parkinson's disease (HCC)    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HCC)    Scoliosis of cervicothoracic spine    Scoliosis of thoracolumbar spine    Spondylosis of lumbar spine    Past Surgical History:  Procedure Laterality Date   HERNIA REPAIR     TONSILLECTOMY     Social History   Tobacco Use   Smoking status: Never   Smokeless tobacco: Never  Vaping Use   Vaping Use: Never used  Substance Use Topics   Alcohol use: Not Currently    Alcohol/week: 3.0 standard drinks of alcohol    Types: 3 Glasses of wine per week    Comment: 1 glass of red wine with dinner some nights   Drug use: No   Social History   Socioeconomic History   Marital status: Legally Separated    Spouse name: Not on file   Number of children: 2   Years of education: BA   Highest education level: Not on file  Occupational History   Not on file  Tobacco Use   Smoking status: Never   Smokeless tobacco:  Never  Vaping Use   Vaping Use: Never used  Substance and Sexual Activity   Alcohol use: Not Currently    Alcohol/week: 3.0 standard drinks of alcohol    Types: 3 Glasses of wine per week    Comment: 1 glass of red wine with dinner some nights   Drug use: No   Sexual activity: Not Currently  Other Topics Concern   Not on file  Social History Narrative   Drinks 2 cups of coffee a day plus tea, 3 cup/day   Social Determinants of Health   Financial Resource Strain: Not on file  Food Insecurity: Not on file  Transportation Needs: Unmet Transportation Needs (05/07/2022)   PRAPARE - Hydrologist (Medical): Yes    Lack of Transportation (Non-Medical): Yes  Physical Activity: Not on file  Stress: Not on file  Social Connections: Not on file  Intimate Partner Violence: Not on file   Family Status  Relation Name Status   Mother  Deceased   Father  Deceased   Sister  Alive   Neg Hx  (Not Specified)   Family History  Problem Relation Age of Onset   Deafness Mother    Deafness Sister    Heart disease Neg Hx    No Known Allergies    Review of Systems  Constitutional:  Negative for chills, diaphoresis, fever, malaise/fatigue and weight loss.  HENT:  Negative for congestion, hearing loss, nosebleeds, sore throat and tinnitus.   Eyes:  Negative for blurred vision, photophobia and redness.  Respiratory:  Negative for cough, hemoptysis, sputum production, shortness of breath, wheezing and stridor.   Cardiovascular:  Negative for chest pain, palpitations, orthopnea, claudication, leg swelling and PND.  Gastrointestinal:  Negative for abdominal pain, blood in stool, constipation, diarrhea, heartburn, nausea and vomiting.  Genitourinary:  Negative for dysuria, flank pain, frequency, hematuria and urgency.  Musculoskeletal:  Positive for falls and joint pain. Negative for back pain, myalgias and neck pain.  Skin:  Negative for itching and rash.  Neurological:   Positive for tremors and weakness. Negative for dizziness, tingling, sensory change, speech change, focal weakness, seizures, loss of consciousness and headaches.  Endo/Heme/Allergies:  Negative for environmental allergies and polydipsia. Does not bruise/bleed easily.  Psychiatric/Behavioral:  Positive for depression. Negative  for memory loss, substance abuse and suicidal ideas. The patient is nervous/anxious. The patient does not have insomnia.       Objective:     There were no vitals taken for this visit. BP Readings from Last 3 Encounters:  05/02/22 118/76  04/28/22 110/79  04/17/22 112/73   Wt Readings from Last 3 Encounters:  05/02/22 163 lb 9.3 oz (74.2 kg)  04/17/22 163 lb 9.3 oz (74.2 kg)  03/30/22 163 lb 9.3 oz (74.2 kg)     No exam is done as this is a telephone visit and no vital signs could be obtained but he states his heart rate is about 120 when he takes it   No results found for any visits on 05/09/22.  Last CBC Lab Results  Component Value Date   WBC 7.6 05/02/2022   HGB 14.5 05/02/2022   HCT 44.3 05/02/2022   MCV 92.5 05/02/2022   MCH 30.3 05/02/2022   RDW 15.1 05/02/2022   PLT 208 05/02/2022   Last metabolic panel Lab Results  Component Value Date   GLUCOSE 90 05/02/2022   NA 138 05/02/2022   K 5.0 05/02/2022   CL 106 05/02/2022   CO2 20 (L) 05/02/2022   BUN 25 (H) 05/02/2022   CREATININE 1.21 05/02/2022   GFRNONAA >60 05/02/2022   CALCIUM 8.8 (L) 05/02/2022   PHOS 2.3 (L) 04/01/2022   PROT 6.7 04/27/2022   ALBUMIN 3.4 (L) 04/27/2022   LABGLOB 2.0 02/14/2022   AGRATIO 1.9 02/14/2022   BILITOT 1.7 (H) 04/27/2022   ALKPHOS 104 04/27/2022   AST 20 04/27/2022   ALT 21 04/27/2022   ANIONGAP 12 05/02/2022   Last lipids Lab Results  Component Value Date   CHOL 165 06/01/2020   HDL 51 06/01/2020   LDLCALC 97 06/01/2020   TRIG 91 06/01/2020   CHOLHDL 3.2 06/01/2020   Last hemoglobin A1c Lab Results  Component Value Date   HGBA1C 6.0  (H) 02/14/2022   Last thyroid functions Lab Results  Component Value Date   TSH 3.280 02/14/2022   Last vitamin D No results found for: "25OHVITD2", "25OHVITD3", "VD25OH" Last vitamin B12 and Folate Lab Results  Component Value Date   VITAMINB12 988 05/10/2016   FOLATE >24.0 05/10/2016      The 10-year ASCVD risk score (Arnett DK, et al., 2019) is: 26.5%    Assessment & Plan:  I personally reviewed all images and lab data in the [facility] system as well as any outside material available during this office visit and agree with the  radiology impressions.   Parkinson disease (HCC) I was able to get his 25/100 Sinemet cut in half through assistance through the hotel  He will go back to the 1-1/2 5 times daily dosing to improve parkinsonism and tremor control  He will continue to the 50/200 dose at bedtime  Rhabdomyolysis Based on his history over the phone his rhabdomyolysis has resolved but he will need follow-up labs  Primary osteoarthritis of right knee He needs a knee replacement on the right but he cannot achieve this until he is stronger and his parkinsonism is under better control  Other idiopathic scoliosis, lumbar region Back pain is not a major issue it is the arthritis in the right knee that is the biggest issue at this time  Unsteady gait The patient is a very high fall risk he is fallen at least 5 times over the past 2 months and is driven by his severe parkinsonism his imbalance and  his associated arthritis of the spine and right lower extremity  Given his high risk he will need placement in long-term care I will process an FL 2 and communicate with APS  Tremor The tremor is worsening it would be improved if he could take his Sinemet on a regular basis to get better access to medications  Pressure injury of skin This will be needed reassessment I am concerned about this as he has been sitting in the wheelchair constantly  Dyslipidemia Currently not taking  statin therapy  Dehydration He appears to be getting hydration with water in the apartment but needs to be reassessed with labs  Atrial fibrillation (HCC) The patient has long-acting propranolol left he will start this again once daily and continue Eliquis twice daily   Diagnoses and all orders for this visit:  Parkinson disease (HCC)  Non-traumatic rhabdomyolysis  Primary osteoarthritis of right knee  Other idiopathic scoliosis, lumbar region  Unsteady gait  Tremor  Pressure injury of right lower back, stage 2 (HCC)  Dyslipidemia  Dehydration  Paroxysmal atrial fibrillation (HCC)   Note he has located his eyedrops for his glaucoma and is taking  Follow Up Instructions: Patient knows an FL 2 will be produced and we will try to get him placed into a long-term care facility as soon as possible, the patient also knows he may have to go back to the hospital if were not able to achieve this to the outpatient realm We will attempt to get him into the office as well when he settles into the nursing home I discussed the assessment and treatment plan with the patient. The patient was provided an opportunity to ask questions and all were answered. The patient agreed with the plan and demonstrated an understanding of the instructions.   The patient was advised to call back or seek an in-person evaluation if the symptoms worsen or if the condition fails to improve as anticipated.  I provided 30 minutes of non-face-to-face time during this encounter  including  median intraservice time , review of notes, labs, imaging, medications  and explaining diagnosis and management to the patient .    Shan Levans, MD

## 2022-05-09 NOTE — Assessment & Plan Note (Signed)
He appears to be getting hydration with water in the apartment but needs to be reassessed with labs

## 2022-05-09 NOTE — Assessment & Plan Note (Signed)
The patient has long-acting propranolol left he will start this again once daily and continue Eliquis twice daily

## 2022-05-09 NOTE — Assessment & Plan Note (Signed)
He needs a knee replacement on the right but he cannot achieve this until he is stronger and his parkinsonism is under better control

## 2022-05-09 NOTE — Telephone Encounter (Signed)
I tried to call the patient ,  no answer.    I am willing to see him again.  See if he can do a video visit or at least a phone visit  I may be able to authorize FL2 for him from that and the ED/hospital records

## 2022-05-09 NOTE — Assessment & Plan Note (Signed)
I was able to get his 25/100 Sinemet cut in half through assistance through the hotel  He will go back to the 1-1/2 5 times daily dosing to improve parkinsonism and tremor control  He will continue to the 50/200 dose at bedtime

## 2022-05-09 NOTE — Assessment & Plan Note (Signed)
The tremor is worsening it would be improved if he could take his Sinemet on a regular basis to get better access to medications

## 2022-05-10 NOTE — Telephone Encounter (Signed)
Paperwork was today

## 2022-05-10 NOTE — Telephone Encounter (Signed)
FL2 signed and emailed to APS ms Dalene Carrow  she confirmed received  trying to get pt out of motel asap  APS sending case worker out today to [employer]

## 2022-05-10 NOTE — Progress Notes (Signed)
See phone note.  Pt not able to attend appt in person d/t transporation barrier

## 2022-05-16 ENCOUNTER — Telehealth: Payer: Self-pay | Admitting: Emergency Medicine

## 2022-05-16 ENCOUNTER — Other Ambulatory Visit: Payer: Self-pay

## 2022-05-16 ENCOUNTER — Other Ambulatory Visit: Payer: Self-pay | Admitting: Critical Care Medicine

## 2022-05-16 MED ORDER — PROPRANOLOL HCL ER 60 MG PO CP24
ORAL_CAPSULE | Freq: Every day | ORAL | 1 refills | Status: AC
  Filled 2022-05-16: qty 30, 30d supply, fill #0

## 2022-05-16 MED ORDER — CARBIDOPA-LEVODOPA ER 50-200 MG PO TBCR
1.0000 | EXTENDED_RELEASE_TABLET | Freq: Every day | ORAL | 1 refills | Status: AC
  Filled 2022-05-16: qty 30, 30d supply, fill #0

## 2022-05-16 MED ORDER — CARBIDOPA-LEVODOPA 25-100 MG PO TABS
ORAL_TABLET | ORAL | 1 refills | Status: AC
  Filled 2022-05-16: qty 225, 30d supply, fill #0

## 2022-05-16 MED ORDER — APIXABAN 5 MG PO TABS
ORAL_TABLET | Freq: Two times a day (BID) | ORAL | 1 refills | Status: AC
  Filled 2022-05-16: qty 60, 30d supply, fill #0

## 2022-05-16 NOTE — Telephone Encounter (Signed)
Copied from CRM [id number]. Topic: General - Other >> May 15, 2022  5:59 PM Franchot Heidelberg wrote: Reason for CRM: Pt called requesting an urgent call back regarding rehabilitation services  Best contact: [phone] >> May 16, 2022  8:49 AM Shanda Bumps I wrote: Patient calling again to check the status.

## 2022-05-17 ENCOUNTER — Telehealth: Payer: Self-pay | Admitting: Emergency Medicine

## 2022-05-17 ENCOUNTER — Other Ambulatory Visit: Payer: Self-pay

## 2022-05-17 NOTE — Telephone Encounter (Signed)
Called pt and he stated that he's wanting for some form to be approve by insurance but in the meantime he needs some out with him to help. (Pt is very concern) I did ask has anyone been out to help and he stated no.

## 2022-05-17 NOTE — Telephone Encounter (Signed)
Copied from CRM [id number]. Topic: General - Other >> May 17, 2022 12:28 PM Everette C wrote: Reason for CRM: The patient would like to speak with C Mack or Dr Delford Field when possible   Please contact further when possible

## 2022-05-17 NOTE — Telephone Encounter (Signed)
Called pt and he stated that he will call me back. Gave him my name to ask for me directly

## 2022-05-17 NOTE — Telephone Encounter (Signed)
Called pt back , he will be giving [place] a call on Monday   See my previous message and I will talk with you come Monday

## 2022-05-17 NOTE — Telephone Encounter (Signed)
Not much I can do from here  Sean Ward was working on him and getting him access to [facility] see Jane's notes any way you can call the pt to see if there is anything you can do  I faxed an FL2 to adult protective service last week

## 2022-05-18 ENCOUNTER — Other Ambulatory Visit: Payer: Self-pay

## 2022-05-18 NOTE — Telephone Encounter (Signed)
Duplicate message. 

## 2022-05-21 ENCOUNTER — Encounter (HOSPITAL_COMMUNITY): Payer: Self-pay

## 2022-05-21 ENCOUNTER — Other Ambulatory Visit: Payer: Self-pay

## 2022-05-21 ENCOUNTER — Emergency Department (HOSPITAL_COMMUNITY)
Admission: EM | Admit: 2022-05-21 | Discharge: 2022-05-24 | Disposition: A | Discharge status: Skilled Nursing Facility | Payer: Medicare Other | Attending: Emergency Medicine | Admitting: Emergency Medicine

## 2022-05-21 ENCOUNTER — Ambulatory Visit: Payer: Self-pay

## 2022-05-21 ENCOUNTER — Telehealth: Payer: Self-pay | Admitting: Critical Care Medicine

## 2022-05-21 DIAGNOSIS — R200 Anesthesia of skin: Secondary | ICD-10-CM | POA: Diagnosis present

## 2022-05-21 DIAGNOSIS — G20 Parkinson's disease: Secondary | ICD-10-CM | POA: Insufficient documentation

## 2022-05-21 DIAGNOSIS — Z7901 Long term (current) use of anticoagulants: Secondary | ICD-10-CM | POA: Diagnosis not present

## 2022-05-21 DIAGNOSIS — R202 Paresthesia of skin: Secondary | ICD-10-CM | POA: Diagnosis not present

## 2022-05-21 LAB — CBG MONITORING, ED: Glucose-Capillary: 103 mg/dL — ABNORMAL HIGH (ref 70–99)

## 2022-05-21 NOTE — Telephone Encounter (Signed)
Pt wanting to be seen this week, is it ok to double ?

## 2022-05-21 NOTE — Telephone Encounter (Signed)
CB- [phone] Pt is calling to schedule appt for Knee pain. Pt reports that he is at ED for Knee Pain.

## 2022-05-21 NOTE — ED Provider Notes (Signed)
[facility]-EMERGENCY DEPT Provider Note   CSN: [id number] Arrival date & time: 05/21/22  1003     History  No chief complaint on file.   Sean Ward is a 81 y.o. male.  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  Patient was hospitalized in June for rhabdomyolysis after being stuck on the ground after a fall for 3 days.  He states that since he was discharged he has had numbness in his bilateral feet.  He denies any associated weakness, saddle anesthesia, loss of bowel or bladder function.  He states he does have chronic back pain.  He denies any other falls.  He states that he has seen his primary doctor since his hospital stay and is being assisted in getting appropriate insurance for long-term care facility and he states that he called his primary doctor today to schedule appointment regarding his feet numbness but has not yet had this evaluated by his primary doctor.  He denies any wounds or color changes to his feet or lower extremity swelling.        Home Medications Prior to Admission medications   Medication Sig Start Date End Date Taking? Authorizing Provider  apixaban (ELIQUIS) 5 MG TABS tablet TAKE 1 TABLET (5 MG TOTAL) BY MOUTH 2 (TWO) TIMES DAILY. Patient taking differently: Take 5 mg by mouth 2 (two) times daily with a meal. 05/16/22 05/16/23 Yes [name], MD  carbidopa-levodopa (SINEMET CR) 50-200 MG tablet TAKE 1 TABLET BY MOUTH AT BEDTIME. Patient taking differently: Take 1 tablet by mouth at bedtime. 10pm 05/16/22  Yes [name], MD  carbidopa-levodopa (SINEMET IR) 25-100 MG tablet TAKE 1 & 1/2 TABLETS BY  MOUTH 5 TIMES A DAY, AT 8 AM, 11 AM, 2 PM, 5 PM AND 8 PM. Patient taking differently: Take 1.5 tablets by mouth See admin instructions. Take 1 1/2 tablet  by mouth 5 times daily - 8am, 11am, 2pm, 5pm and 8pm 05/16/22 05/16/23 Yes [name], MD  melatonin 5 MG TABS Take 5 mg by mouth at bedtime.   Yes [provider]  propranolol ER (INDERAL LA) 60 MG 24 hr capsule TAKE 1 CAPSULE (60 MG TOTAL) BY MOUTH DAILY. Patient taking differently: Take 60 mg by mouth at bedtime. 05/16/22  Yes [name], MD      Allergies    Patient has no known allergies.    Review of Systems   Review of Systems  Physical Exam Updated Vital Signs BP (!) 119/91   Pulse 67   Temp 97.7 F (36.5 C) (Oral)   Resp 18   Ht 5\' 8"  (1.727 m)   Wt 72.6 kg   SpO2 100%   BMI 24.33 kg/m  Physical Exam Vitals and nursing note reviewed.  Constitutional:      General: He is not in acute distress.    Appearance: Normal appearance.  HENT:     Head: Normocephalic and atraumatic.     Mouth/Throat:     Mouth: Mucous membranes are moist.  Eyes:     Extraocular Movements: Extraocular movements intact.     Conjunctiva/sclera: Conjunctivae normal.  Cardiovascular:     Rate and Rhythm: Normal rate and regular rhythm.     Comments: DP pulses 1+ in bilateral feet Pulmonary:     Effort: Pulmonary effort is normal.  Breath sounds: Normal breath sounds.  Abdominal:     General: Abdomen is flat.     Palpations: Abdomen is soft.  Musculoskeletal:        General: Normal range of motion.     Cervical back: Normal range of motion and neck supple.     Comments: No midline neck or back tenderness. No tenderness to bilateral LE  Skin:    General: Skin is warm and dry.     Comments: Bilateral feet pink, warm and well perfused.   Neurological:     Mental Status: He is alert and oriented to person, place, and time.     Comments: Strength intact. Sensation diminished diffusely in bilateral feet, normal sensation in shins and thigh.     ED Results / Procedures / Treatments   Labs (all labs ordered are listed, but only abnormal results are displayed) Labs  Reviewed  CBG MONITORING, ED - Abnormal; Notable for the following components:      Result Value   Glucose-Capillary 103 (*)    All other components within normal limits    EKG None  Radiology No results found.  Procedures Procedures    Medications Ordered in ED Medications - No data to display  ED Course/ Medical Decision Making/ A&P                           Medical Decision Making Patient is a 81 year old male with a past medical history of Parkinson's disease, wheelchair-bound at baseline presenting to the emergency department with bilateral feet numbness.  Accu-Chek was performed within normal range and no history of diabetes, making diabetic neuropathy unlikely.  The patient does have palpable pulses in his bilateral feet and his feet are warm and well perfused, making ischemic limb unlikely.  Patient has no wounds and no erythema or warmth, making infectious etiology unlikely.  He does report chronic back pain but has no palpable back pain on exam, no saddle anesthesia, no loss of bowel or bladder function and no focal weakness on exam making spinal cord etiology unlikely cause of his numbness.  After extensive discussion with the patient regarding no emergent etiology of his bilateral foot numbness, he is amenable to outpatient work-up.  He was given strict return precautions.  Amount and/or Complexity of Data Reviewed External Data Reviewed: radiology and notes.    Details: Previous hospitalization for rhabdomyolysis, recent PCP visit approximately 2 weeks ago working on establishing long-term care facility due to the patient being nonambulatory.  This documents additionally chronic back pain. 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           Final Clinical Impression(s) / ED Diagnoses Final diagnoses:  Paresthesia of both feet    Rx / DC Orders ED Discharge Orders     None         Bairon, Klemann, DO 05/21/22 1540

## 2022-05-21 NOTE — Telephone Encounter (Signed)
Pls be advised he has no transportation and last time when jane set up a ride he could not make it to the lobby of the motel he is staying to get in the [place] lyft vehicle  they will NOT go to his room to retrieve him d/t security reasons

## 2022-05-21 NOTE — ED Notes (Signed)
[facility] called for transport.  

## 2022-05-21 NOTE — Telephone Encounter (Signed)
    Chief Complaint: Right knee pain. Asking "what is my diagnosis and treatments?" Asking to be worked in this working this week. Symptoms: Pain right knee Frequency: June Pertinent Negatives: Patient denies  Disposition: [] ED /[] Urgent Care (no appt availability in office) / [] Appointment(In office/virtual)/ []  Mobile Virtual Care/ [] Home Care/ [] Refused Recommended Disposition /[] [facility] [place] [facility]/ [x]  Follow-up with PCP Additional Notes: Asking to be worked in, please advise.   Answer Assessment - Initial Assessment Questions 1. LOCATION and RADIATION: "Where is the pain located?"      Right knee pain 2. QUALITY: "What does the pain feel like?"  (e.g., sharp, dull, aching, burning)     Ache - cannot walk 3. SEVERITY: "How bad is the pain?" "What does it keep you from doing?"   (Scale 1-10; or mild, moderate, severe)   -  MILD (1-3): doesn't interfere with normal activities    -  MODERATE (4-7): interferes with normal activities (e.g., work or school) or awakens from sleep, limping    -  SEVERE (8-10): excruciating pain, unable to do any normal activities, unable to walk     Moderate 4. ONSET: "When did the pain start?" "Does it come and go, or is it there all the time?"     June 5. RECURRENT: "Have you had this pain before?" If Yes, ask: "When, and what happened then?"     No 6. SETTING: "Has there been any recent work, exercise or other activity that involved that part of the body?"      No 7. AGGRAVATING FACTORS: "What makes the knee pain worse?" (e.g., walking, climbing stairs, running)     Walking 8. ASSOCIATED SYMPTOMS: "Is there any swelling or redness of the knee?"     No 9. OTHER SYMPTOMS: "Do you have any other symptoms?" (e.g., chest pain, difficulty breathing, fever, calf pain)     No 10. PREGNANCY: "Is there any chance you are pregnant?" "When was your last menstrual period?"       N/a  Protocols used: Knee Pain-A-AH

## 2022-05-21 NOTE — ED Notes (Signed)
Talked to patient's emergency contact who stated she will not be responsible for him and it is not safe for him to be at the hotel by himself, made provider aware of this

## 2022-05-21 NOTE — Telephone Encounter (Signed)
Ok to work in wed or [employer]

## 2022-05-21 NOTE — Telephone Encounter (Signed)
I called patient and talked with him they sent him home from ED,For his appt he was unsure and asked if he could call tomorrow. I told him I Will call around 9 to check on him. He stated that he is worried with everything going on

## 2022-05-21 NOTE — Telephone Encounter (Signed)
I called and he stated that he was sent home from Ed, when asked the reason he said it was a lot.

## 2022-05-21 NOTE — ED Triage Notes (Signed)
Per EMS- patient c/o numbness of both feet x 1 month.

## 2022-05-21 NOTE — Discharge Instructions (Addendum)
You were seen in the emergency department for your bilateral feet numbness.  Your blood sugar here was normal.  There were no signs of an infection and you had good blood flow to your feet.  You should follow-up with your primary doctor to have further work-up of your feet numbness.  Follow-up with the foot specialist.  You should return to the emergency department if your feet turning back or blue, you have progressing numbness of her legs or weakness, you lose control of your bowels or your bladder, you cannot feel your groin, you have fevers, you have wounds with pus draining from your feet or if you have any other new or concerning symptoms.

## 2022-05-21 NOTE — Telephone Encounter (Signed)
Pt. Called again. Instructed PCP will get back with him as he can review his request. Verbalizes understanding.

## 2022-05-22 ENCOUNTER — Telehealth: Payer: Self-pay | Admitting: Emergency Medicine

## 2022-05-22 MED ORDER — PROPRANOLOL HCL ER 60 MG PO CP24
60.0000 mg | ORAL_CAPSULE | Freq: Every day | ORAL | Status: DC
  Administered 2022-05-22 – 2022-05-23 (×2): 60 mg via ORAL
  Filled 2022-05-22 (×2): qty 1

## 2022-05-22 MED ORDER — CARBIDOPA-LEVODOPA 25-100 MG PO TABS
1.5000 | ORAL_TABLET | Freq: Every day | ORAL | Status: DC
  Administered 2022-05-22 – 2022-05-23 (×8): 1.5 via ORAL
  Filled 2022-05-22 (×15): qty 1.5

## 2022-05-22 MED ORDER — CARBIDOPA-LEVODOPA ER 50-200 MG PO TBCR
1.0000 | EXTENDED_RELEASE_TABLET | Freq: Every day | ORAL | Status: DC
  Administered 2022-05-22 – 2022-05-23 (×2): 1 via ORAL
  Filled 2022-05-22 (×2): qty 1

## 2022-05-22 MED ORDER — APIXABAN 5 MG PO TABS
5.0000 mg | ORAL_TABLET | Freq: Two times a day (BID) | ORAL | Status: DC
  Administered 2022-05-22 – 2022-05-23 (×3): 5 mg via ORAL
  Filled 2022-05-22 (×5): qty 1

## 2022-05-22 MED ORDER — MELATONIN 5 MG PO TABS
5.0000 mg | ORAL_TABLET | Freq: Every day | ORAL | Status: DC
  Administered 2022-05-22 – 2022-05-23 (×2): 5 mg via ORAL
  Filled 2022-05-22 (×2): qty 1

## 2022-05-22 NOTE — NC FL2 (Signed)
[place] MEDICAID FL2 LEVEL OF CARE SCREENING TOOL     IDENTIFICATION  Patient Name: Purvis Sidle Birthdate: 06/28/1941 Sex: male Admission Date (Current Location): 05/21/2022  [facility] and [place] Number:  Producer, television/film/video and Address:  [facility],  [address] [place], [place] [address]      Provider Number: [id number]  Attending Physician Name and Address:  Default, Provider, MD  Relative Name and Phone Number:       Current Level of Care: Hospital Recommended Level of Care: Skilled Nursing Facility Prior Approval Number:    Date Approved/Denied: 05/22/22 PASRR Number: [id number]  Discharge Plan: SNF    Current Diagnoses: Patient Active Problem List   Diagnosis Date Noted   Pressure injury of skin 04/01/2022   Rhabdomyolysis 03/30/2022   Dehydration 03/30/2022   Psoriasis 11/13/2021   Dermatochalasis of both upper eyelids 08/04/2021   Neck pain 05/10/2021   Other idiopathic scoliosis, 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   Lumbar back pain 05/10/2021   Parkinson disease (HCC) 02/28/2018   Primary osteoarthritis of right knee 08/16/2016   Dyslipidemia 08/16/2016   Unsteady gait 05/24/2016   Atrial fibrillation (HCC) 10/27/2014   Onychomycosis 05/03/2014   Tremor 04/08/2014   Venous stasis 03/25/2014    Orientation RESPIRATION BLADDER Height & Weight     Self, Time, Situation, Place  Normal Continent Weight: 160 lb (72.6 kg) Height:  5\' 8"  (172.7 cm)  BEHAVIORAL SYMPTOMS/MOOD NEUROLOGICAL BOWEL NUTRITION STATUS      Continent    AMBULATORY STATUS COMMUNICATION OF NEEDS Skin   Limited Assist Verbally Normal                       Personal Care Assistance Level of Assistance  Bathing, Feeding, Dressing Bathing Assistance: Maximum assistance Feeding assistance: Limited assistance Dressing Assistance: Limited assistance     Functional Limitations Info  Sight, Hearing, Speech Sight Info: Adequate Hearing Info: Adequate Speech Info: Adequate     SPECIAL CARE FACTORS FREQUENCY                       Contractures Contractures Info: Not present    Additional Factors Info  Code Status Code Status Info: Full             Current Medications (05/22/2022):  This is the current hospital active medication list Current Facility-Administered Medications  Medication Dose Route Frequency Provider Last Rate Last Admin   apixaban (ELIQUIS) tablet 5 mg  5 mg Oral BID WC 07/22/2022, MD   5 mg at 05/22/22 1100   carbidopa-levodopa (SINEMET CR) 50-200 MG per tablet controlled release 1 tablet  1 tablet Oral QHS Plunkett, Whitney, MD       carbidopa-levodopa (SINEMET IR) 25-100 MG per tablet immediate release 1.5 tablet  1.5 tablet Oral 5 X Daily Plunkett, Whitney, MD   1.5 tablet at 05/22/22 1539   melatonin tablet 5 mg  5 mg Oral QHS Plunkett, Whitney, MD       propranolol ER (INDERAL LA) 24 hr capsule 60 mg  60 mg Oral QHS Plunkett, 07/22/22, MD       Current Outpatient Medications  Medication Sig Dispense Refill   apixaban (ELIQUIS) 5 MG TABS tablet TAKE 1 TABLET (5 MG TOTAL) BY MOUTH 2 (TWO) TIMES DAILY. (Patient taking differently: Take 5 mg by mouth 2 (two) times daily with a meal.) 180 tablet 1   carbidopa-levodopa (SINEMET CR) 50-200 MG tablet TAKE 1 TABLET BY MOUTH AT  BEDTIME. (Patient taking differently: Take 1 tablet by mouth at bedtime. 10pm) 90 tablet 1   carbidopa-levodopa (SINEMET IR) 25-100 MG tablet TAKE 1 & 1/2 TABLETS BY  MOUTH 5 TIMES A DAY, AT 8 AM, 11 AM, 2 PM, 5 PM AND 8 PM. (Patient taking differently: Take 1.5 tablets by mouth See admin instructions. Take 1 1/2 tablet by mouth 5 times daily - 8am, 11am, 2pm, 5pm and 8pm) 675 tablet 1   melatonin 5 MG TABS Take 5 mg by mouth at bedtime.     propranolol ER (INDERAL LA) 60 MG 24 hr capsule TAKE 1 CAPSULE (60 MG TOTAL) BY MOUTH DAILY. (Patient taking differently: Take 60 mg by mouth at bedtime.) 90 capsule 1     Discharge Medications: Please see discharge  summary for a list of discharge medications.  Relevant Imaging Results:  Relevant Lab Results:   Additional Information [place] Jessenya Berdan LCSW-A  SSI# [id number]  Georgie Chard, LCSW

## 2022-05-22 NOTE — ED Notes (Signed)
Called to [employer] talked to front desk staff who stated to call back at 7am when first shift comes in to talk to someone about this patient's caregiver. Patient stated his caregiver will arrive at 0900. [facility] set up to transport pt back to hotel.

## 2022-05-22 NOTE — ED Notes (Signed)
Pt will be in the ER until morning he stated that his aide will be there at his home at [facility]

## 2022-05-22 NOTE — TOC Initial Note (Signed)
Transition of Care [facility]) - Initial/Assessment Note    Patient Details  Name: Sean Ward MRN: [id number] Date of Birth: 1941/04/27  [facility]) CM/SW Contact:    Larrie Kass, LCSW Phone Number: 05/22/2022, 12:26 PM  Clinical Narrative:                 CSW attempted to speak with pt. Pt requested pt to return back later.  CSW spoke with pt's APS worker Olga Millers [phone]) who reported pt's Medicaid application was submitted on the 18th of last month. Pt's APS worker stated she has been working to get LTC placement in the community. She reported contacting [facility] who declined pt. She did not report why pt was declined. CSW  informed pt's APS worker about the barriers to placement. CSW inquired about pt's income and if DSS is able to assist with the financial responsibility while pt's Medicaid is pending. Ms. Dalene Carrow stated she can discuss this with her supervisor if there is a facility willing to take the pt.   CSW spoke with Marcelino Duster from [facility], she reported pt has a balance and they would not be able to readmit. TOC to follow.    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 [place] [address] Phone: [phone] Fax: [phone]     Social Determinants of Health (SDOH) Interventions    Readmission Risk Interventions     No data to display

## 2022-05-22 NOTE — Telephone Encounter (Signed)
Copied from CRM [id number]. Topic: General - Other >> May 22, 2022 11:15 AM Sean Ward wrote: Reason for CRM: The patient has returned a missed call from their PCP   Please contact further when possible

## 2022-05-22 NOTE — ED Provider Notes (Signed)
Emergency Medicine Observation Re-evaluation Note  Sean Ward is a 81 y.o. male, seen on rounds today.  Pt initially presented to the ED for complaints of No chief complaint on file. Currently, the patient is resting in no acute distress.  Physical Exam  BP (!) 122/93 (BP Location: Right Arm)   Pulse 87   Temp (!) 97.5 F (36.4 C) (Oral)   Resp 16   Ht 5\' 8"  (1.727 m)   Wt 72.6 kg   SpO2 100%   BMI 24.33 kg/m  Physical Exam General: nad Cardiac: rrr Lungs: clear   ED Course / MDM  EKG:   I have reviewed the labs performed to date as well as medications administered while in observation.  Recent changes in the last 24 hours include none.  Plan  Current plan is for patient was seen on evening shift and was waiting PTR.  However this morning there has been more information about patient's social situation.  Patient had been living [facility] and with a caretaker who is now not been present for the last 2 weeks.  Transport will not take him back to the [facility] and because the staff was trying to care for him but he is wheelchair-bound and unable to care for himself.  Patient needs transition of care consult for possible long-term placement. , MD 05/22/22 1004

## 2022-05-22 NOTE — Evaluation (Signed)
Physical Therapy Evaluation Patient Details Name: Sean Ward MRN: [id number] DOB: [birth year] Today's Date: 05/22/2022  History of Present Illness  81 y/o male presented to ED after yelling for help at [facility] where he resides. pt had a caregiver that has not been coming for possible ~2wks. CT/ Imaging negative for fractures.  PMH: Parkinson's, Afib on Eliquis  Clinical Impression  Pt admitted with above diagnosis.  Pt may benefit from SNF however may need  eventual LTC. He tells PT he does not like the term "long term care" which was mentioned to him by TOC. Pt is unsafe to return to his prior living situation at his current status.  See below for mobility details. Pt is cooperative and able to follow commands.  Will follow in acute setting.   Pt currently with functional limitations due to the deficits listed below (see PT Problem List). Pt will benefit from skilled PT to increase their independence and safety with mobility to allow discharge to the venue listed below.          Recommendations for follow up therapy are one component of a multi-disciplinary discharge planning process, led by the attending physician.  Recommendations may be updated based on patient status, additional functional criteria and insurance authorization.  Follow Up Recommendations Skilled nursing-short term rehab (<3 hours/day) Can patient physically be transported by private vehicle: No    Assistance Recommended at Discharge Frequent or constant Supervision/Assistance  Patient can return home with the following  A lot of help with bathing/dressing/bathroom;A lot of help with walking and/or transfers;Assist for transportation;Assistance with cooking/housework;Help with stairs or ramp for entrance    Equipment Recommendations None recommended by PT  Recommendations for Other Services       Functional Status Assessment Patient has had a recent decline in their functional status and demonstrates the  ability to make significant improvements in function in a reasonable and predictable amount of time.     Precautions / Restrictions Precautions Precautions: Fall Restrictions Weight Bearing Restrictions: No      Mobility  Bed Mobility Overal bed mobility: Needs Assistance Bed Mobility: Supine to Sit, Sit to Supine     Supine to sit: Min assist Sit to supine: Mod assist, +2 for physical assistance, +2 for safety/equipment   General bed mobility comments: assist to elevate trunk, incr time, assist to control descent of trunk and elevate trunk    Transfers Overall transfer level: Needs assistance Equipment used: Rolling walker (2 wheels) Transfers: Sit to/from Stand Sit to Stand: Max assist, +2 physical assistance, +2 safety/equipment           General transfer comment: pt is able to partial stand with min guard using UEs and keep both feet on floor.  on attempts to stand with and without RW pt requires max assist of 2, posterior bias and lifts LLE off floor. cues for technique and safety    Ambulation/Gait                  Stairs            Wheelchair Mobility    Modified Rankin (Stroke Patients Only)       Balance Overall balance assessment: Needs assistance Sitting-balance support: Feet supported, No upper extremity supported Sitting balance-Leahy Scale: Fair     Standing balance support: During functional activity, Bilateral upper extremity supported Standing balance-Leahy Scale: Zero Standing balance comment: pt is unable to achieve full standing, max assist for 3/4 stand  Pertinent Vitals/Pain Pain Assessment Pain Assessment: No/denies pain (feet painful at times d/t neuropathy)    Home Living Family/patient expects to be discharged to:: Private residence Living Arrangements: Alone Available Help at Discharge: Personal care attendant (2 hrs per day) Type of Home: Other(Comment) (motel)            Home Equipment: Conservation officer, nature (2 wheels);Wheelchair - manual Additional Comments: pt reports he was living in his own 2nd level apt until June. He has a caregiver that was coming 2 hrs per day, however aide stopped coming ~ 2 wks ago per pt.  pt states they delivered a w/c to his room but he did not know how to use it    Prior Function Prior Level of Function : Independent/Modified Independent;Driving             Mobility Comments: unable to ambulate or transfer per pt       Hand Dominance        Extremity/Trunk Assessment   Upper Extremity Assessment Upper Extremity Assessment: Generalized weakness;RUE deficits/detail RUE Deficits / Details: shoulder flexion to 90 degrees. strength 2+/5    Lower Extremity Assessment Lower Extremity Assessment: Generalized weakness;RLE deficits/detail;LLE deficits/detail RLE Deficits / Details: knee flexion limited by pain (~ 75 degrees) strength ~ 3-/5 RLE Sensation: history of peripheral neuropathy RLE Coordination: decreased gross motor;decreased fine motor LLE Deficits / Details: AROM grossly WFL knee and hip. strength ~ 3/5 LLE Sensation: history of peripheral neuropathy LLE Coordination: decreased gross motor;decreased fine motor       Communication   Communication: No difficulties  Cognition Arousal/Alertness: Awake/alert Behavior During Therapy: WFL for tasks assessed/performed Overall Cognitive Status: No family/caregiver present to determine baseline cognitive functioning                                 General Comments: pt is A&O x3, is mildly tangential at times requiring redirection to current topic/question        General Comments      Exercises     Assessment/Plan    PT Assessment Patient needs continued PT services  PT Problem List Decreased strength;Decreased mobility;Decreased activity tolerance;Decreased balance;Decreased knowledge of use of DME;Decreased range of motion       PT  Treatment Interventions DME instruction;Therapeutic exercise;Gait training;Functional mobility training;Therapeutic activities;Patient/family education;Balance training    PT Goals (Current goals can be found in the Care Plan section)  Acute Rehab PT Goals Patient Stated Goal: to get back to living on his own PT Goal Formulation: With patient Time For Goal Achievement: 06/05/22 Potential to Achieve Goals: Fair    Frequency Min 2X/week     Co-evaluation               AM-PAC PT "6 Clicks" Mobility  Outcome Measure Help needed turning from your back to your side while in a flat bed without using bedrails?: A Little Help needed moving from lying on your back to sitting on the side of a flat bed without using bedrails?: A Little Help needed moving to and from a bed to a chair (including a wheelchair)?: Total Help needed standing up from a chair using your arms (e.g., wheelchair or bedside chair)?: Total Help needed to walk in hospital room?: Total Help needed climbing 3-5 steps with a railing? : Total 6 Click Score: 10    End of Session Equipment Utilized During Treatment: Gait belt Activity Tolerance: Patient limited by fatigue Patient  left: in bed;with call bell/phone within reach Nurse Communication: Mobility status PT Visit Diagnosis: Other abnormalities of gait and mobility (R26.89);Muscle weakness (generalized) (M62.81)    Time: 5784-6962 PT Time Calculation (min) (ACUTE ONLY): 23 min   Charges:   PT Evaluation $PT Eval Low Complexity: 1 Low PT Treatments $Therapeutic Activity: 8-22 mins        Delice Bison, PT  Acute Rehab Dept [facility]) [phone]  [facility] Weekend Pager [facility] only)  [phone]  05/22/2022   [facility] 05/22/2022, 4:21 PM

## 2022-05-22 NOTE — Telephone Encounter (Signed)
Called patient twice, first time I was unable to leave voicemail, The second time I left a voicemail. Trying to schedule him a appointment for tomorrow or Thursday.

## 2022-05-22 NOTE — ED Notes (Signed)
[facility] [place] in contacted to see if the patient's caregiver had arrived. Per Tacey Ruiz the front desk attendant the caregiver has not arrived and was told to try again in an hour.

## 2022-05-23 ENCOUNTER — Telehealth: Payer: Self-pay

## 2022-05-23 NOTE — Telephone Encounter (Signed)
I spoke to Ms Arias/ APS regarding the status of patient's placement.  He is currently in the hospital and she has been in contact with the hospital SW regarding placement    Ms Dalene Carrow said that they, DSS,  placed an aide in the motel to assist the patient and the agency pulled out after 1 day. The patient is a 2 person transfer.   Ms Dalene Carrow has spoken to the patient multiple times since he arrived at the hospital and explained to him that they are looking for placement for him in a LTC facility. His medicaid application was submitted 05/09/2022 and they are still waiting for a determination of eligibility.   She has explained to the patient that  even if approved for Medicaid, he will need to relinquish his monthly check to pay for his care. He was not pleased with this option but she explained to him that at this point there really is no other option.   Ms Dalene Carrow will remain in contact with the patient and the hospital SW to facilitate a safe discharge plan

## 2022-05-23 NOTE — Progress Notes (Signed)
CSW spoke with Sean Ward from [facility] she reported the facility can offer Sean Ward a bed. CSW will work [employer].   Insurance auth pending.   Sean Ward.Sean Ward, MSW, LCSWA [facility]  Transitions of Care Clinical Social Worker I Direct Dial: [phone]  Fax: [phone] [email or web address]

## 2022-05-23 NOTE — ED Provider Notes (Signed)
Emergency Medicine Observation Re-evaluation Note  Sean Ward is a 81 y.o. male, seen on rounds today.  Pt initially presented to the ED for complaints of No chief complaint on file. Currently, the patient is pt had just finished breakfast and is sleeping.  Physical Exam  BP 108/80 (BP Location: Right Arm)   Pulse 67   Temp 98 F (36.7 C) (Oral)   Resp 18   Ht 5\' 8"  (1.727 m)   Wt 72.6 kg   SpO2 98%   BMI 24.33 kg/m  Physical Exam General: sleeping, nad Cardiac: rrr Lungs: clear Abd:  no pain  ED Course / MDM  EKG:   I have reviewed the labs performed to date as well as medications administered while in observation.  Recent changes in the last 24 hours include none.  Plan  Current plan is for placement.  has offered him a bed.  Hopeful pt will be transported today or tomorrow.      Lacinda Axon, MD 05/23/22 [phone]

## 2022-05-23 NOTE — ED Notes (Signed)
Pt put on condom cath and brief changed.

## 2022-05-23 NOTE — Progress Notes (Signed)
CSW receive a call from [facility] , they are requesting more information on pt's prior function. CSW spoke with pt in regard to his living situation. Pt reported needing two people to assist him with transferring from the bed to the wheelchair, and the wheelchair to the bathroom. CSW has updated [employer], [name] is still pending.   Valentina Shaggy.Emeree Mahler, MSW, LCSWA [facility]  Transitions of Care Clinical Social Worker I Direct Dial: [phone]  Fax: [phone] [email or web address]

## 2022-05-23 NOTE — ED Notes (Signed)
Brief changed and condom cath applied.

## 2022-05-23 NOTE — ED Notes (Signed)
Meal tray provide and food cut up for patient.  Provided with coke at patients request.

## 2022-05-23 NOTE — Telephone Encounter (Signed)
I called Ms Arias/[facility] # [phone] to check on status of SNF/rehab placement. Message left with call back requested.   The patient is not eligible for PCS without Medicaid

## 2022-05-23 NOTE — ED Notes (Signed)
Meal tray provided.

## 2022-05-24 ENCOUNTER — Telehealth: Payer: Self-pay | Admitting: Critical Care Medicine

## 2022-05-24 NOTE — ED Notes (Signed)
Report called to [facility] at this

## 2022-05-24 NOTE — Progress Notes (Signed)
Pt's insurance [name] was approved, pt to d/c to [facility]. Room assignment 405 call report to [phone].MD and RN made aware.  Sean Ward.Sean Ward, MSW, LCSWA [facility]  Transitions of Care Clinical Social Worker I Direct Dial: [phone]  Fax: [phone] [email or web address]

## 2022-05-24 NOTE — Telephone Encounter (Signed)
Great news thanks Erskine Squibb

## 2022-05-24 NOTE — Telephone Encounter (Signed)
Patient called back line dropped with conversation with Dr Delford Field, per note today, Wynelle Link call back.

## 2022-05-24 NOTE — Telephone Encounter (Signed)
I spoke to the Sean Ward CSW at [facility] ED  she states Sean Ward has been accepted at [facility] waiting on bed.  A barrier was Sean Ward did not want to use savings and montly SS payments to pay copays for LTC   Sean Ward stated Sean Ward is divorced and wife has cancer but they had called the wife and she wanted to help him but Sean Ward declined  I dont think this pt is very transparent.  Sean Ward is still at [facility] hold area in ED  I will try to call him to today to encourage placement

## 2022-05-24 NOTE — Telephone Encounter (Signed)
Noted   Duplicate message see previous message

## 2022-05-24 NOTE — Telephone Encounter (Signed)
Thank you so much

## 2022-05-24 NOTE — Telephone Encounter (Signed)
Called and spoke to the patient he is in a hall bed at [facility].  I told him that my understanding is his spouse who is divorced from is interested in helping him but he declined the assistance that is not his understanding.  Patient knows that they are working on placement we are also working on [place] through APS but this will take time.  I told him 1 barrier is his reluctance to release any of his own personal resources to pay for his stay in the long-term care but he likely will need to do that initially in order to get into the long-term care.  I told him to be cooperative and work with the staff in the emergency room so they can get him placed.  He then had to stop the call because he was sliding down in the bed and was calling for assistance by one of the team members in the emergency room so we ended the call at that time

## 2022-06-02 ENCOUNTER — Emergency Department (HOSPITAL_COMMUNITY): Payer: Medicare Other

## 2022-06-02 ENCOUNTER — Other Ambulatory Visit: Payer: Self-pay

## 2022-06-02 ENCOUNTER — Inpatient Hospital Stay (HOSPITAL_COMMUNITY)
Admission: EM | Admit: 2022-06-02 | Discharge: 2022-06-06 | DRG: 871 | Disposition: A | Discharge status: Skilled Nursing Facility | Payer: Medicare Other | Attending: Internal Medicine | Admitting: Internal Medicine

## 2022-06-02 ENCOUNTER — Encounter (HOSPITAL_COMMUNITY): Payer: Self-pay | Admitting: Emergency Medicine

## 2022-06-02 DIAGNOSIS — K810 Acute cholecystitis: Secondary | ICD-10-CM | POA: Diagnosis present

## 2022-06-02 DIAGNOSIS — K82A1 Gangrene of gallbladder in cholecystitis: Secondary | ICD-10-CM | POA: Diagnosis present

## 2022-06-02 DIAGNOSIS — G20 Parkinson's disease: Secondary | ICD-10-CM | POA: Diagnosis present

## 2022-06-02 DIAGNOSIS — I4819 Other persistent atrial fibrillation: Secondary | ICD-10-CM | POA: Diagnosis present

## 2022-06-02 DIAGNOSIS — L89212 Pressure ulcer of right hip, stage 2: Secondary | ICD-10-CM | POA: Diagnosis present

## 2022-06-02 DIAGNOSIS — L89132 Pressure ulcer of right lower back, stage 2: Secondary | ICD-10-CM | POA: Diagnosis present

## 2022-06-02 DIAGNOSIS — G9341 Metabolic encephalopathy: Secondary | ICD-10-CM | POA: Diagnosis present

## 2022-06-02 DIAGNOSIS — Z79899 Other long term (current) drug therapy: Secondary | ICD-10-CM | POA: Diagnosis not present

## 2022-06-02 DIAGNOSIS — E785 Hyperlipidemia, unspecified: Secondary | ICD-10-CM | POA: Diagnosis present

## 2022-06-02 DIAGNOSIS — K819 Cholecystitis, unspecified: Principal | ICD-10-CM

## 2022-06-02 DIAGNOSIS — Z7901 Long term (current) use of anticoagulants: Secondary | ICD-10-CM

## 2022-06-02 DIAGNOSIS — I4891 Unspecified atrial fibrillation: Secondary | ICD-10-CM | POA: Diagnosis present

## 2022-06-02 DIAGNOSIS — G20A1 Parkinson's disease without dyskinesia, without mention of fluctuations: Secondary | ICD-10-CM | POA: Diagnosis present

## 2022-06-02 DIAGNOSIS — K5900 Constipation, unspecified: Secondary | ICD-10-CM | POA: Diagnosis present

## 2022-06-02 DIAGNOSIS — A419 Sepsis, unspecified organism: Secondary | ICD-10-CM | POA: Diagnosis present

## 2022-06-02 HISTORY — PX: IR PERC CHOLECYSTOSTOMY: IMG2326

## 2022-06-02 LAB — PROTIME-INR
INR: 2.7 — ABNORMAL HIGH (ref 0.8–1.2)
Prothrombin Time: 28.2 seconds — ABNORMAL HIGH (ref 11.4–15.2)

## 2022-06-02 LAB — CBC
HCT: 44.4 % (ref 39.0–52.0)
Hemoglobin: 15.6 g/dL (ref 13.0–17.0)
MCH: 32.1 pg (ref 26.0–34.0)
MCHC: 35.1 g/dL (ref 30.0–36.0)
MCV: 91.4 fL (ref 80.0–100.0)
Platelets: 268 10*3/uL (ref 150–400)
RBC: 4.86 MIL/uL (ref 4.22–5.81)
RDW: 15.5 % (ref 11.5–15.5)
WBC: 18.3 10*3/uL — ABNORMAL HIGH (ref 4.0–10.5)
nRBC: 0 % (ref 0.0–0.2)

## 2022-06-02 LAB — LACTIC ACID, PLASMA
Lactic Acid, Venous: 1.8 mmol/L (ref 0.5–1.9)
Lactic Acid, Venous: 2.5 mmol/L (ref 0.5–1.9)
Lactic Acid, Venous: 1.8 mmol/L (ref 0.5–1.9)

## 2022-06-02 LAB — COMPREHENSIVE METABOLIC PANEL
ALT: 12 U/L (ref 0–44)
AST: 32 U/L (ref 15–41)
Albumin: 2.4 g/dL — ABNORMAL LOW (ref 3.5–5.0)
Alkaline Phosphatase: 100 U/L (ref 38–126)
Anion gap: 9 (ref 5–15)
BUN: 41 mg/dL — ABNORMAL HIGH (ref 8–23)
CO2: 23 mmol/L (ref 22–32)
Calcium: 8.9 mg/dL (ref 8.9–10.3)
Chloride: 104 mmol/L (ref 98–111)
Creatinine, Ser: 1.17 mg/dL (ref 0.61–1.24)
GFR, Estimated: >60 mL/min (ref >60)
Glucose, Bld: 170 mg/dL — ABNORMAL HIGH (ref 70–99)
Potassium: 3.8 mmol/L (ref 3.5–5.1)
Sodium: 136 mmol/L (ref 135–145)
Total Bilirubin: 2.3 mg/dL — ABNORMAL HIGH (ref 0.3–1.2)
Total Protein: 5.9 g/dL — ABNORMAL LOW (ref 6.5–8.1)

## 2022-06-02 LAB — APTT: aPTT: 48 seconds — ABNORMAL HIGH (ref 24–36)

## 2022-06-02 LAB — LIPASE, BLOOD: Lipase: 29 U/L (ref 11–51)

## 2022-06-02 LAB — TYPE AND SCREEN
ABO/RH(D): O POS
Antibody Screen: NEGATIVE

## 2022-06-02 MED ORDER — IOHEXOL 300 MG/ML  SOLN
80.0000 mL | Freq: Once | INTRAMUSCULAR | Status: AC | PRN
  Administered 2022-06-02: 80 mL via INTRAVENOUS

## 2022-06-02 MED ORDER — MIDAZOLAM HCL 2 MG/2ML IJ SOLN
INTRAMUSCULAR | Status: AC | PRN
  Administered 2022-06-02 (×2): 1 mg via INTRAVENOUS

## 2022-06-02 MED ORDER — LACTATED RINGERS IV BOLUS
1000.0000 mL | Freq: Once | INTRAVENOUS | Status: AC
  Administered 2022-06-02: 1000 mL via INTRAVENOUS

## 2022-06-02 MED ORDER — PIPERACILLIN-TAZOBACTAM 4.5 G IVPB
4.5000 g | INTRAVENOUS | Status: DC
  Filled 2022-06-02: qty 100

## 2022-06-02 MED ORDER — VANCOMYCIN HCL 1500 MG/300ML IV SOLN
1500.0000 mg | Freq: Once | INTRAVENOUS | Status: AC
  Administered 2022-06-02: 1500 mg via INTRAVENOUS
  Filled 2022-06-02: qty 300

## 2022-06-02 MED ORDER — LIDOCAINE HCL 1 % IJ SOLN
INTRAMUSCULAR | Status: AC
  Filled 2022-06-02: qty 20

## 2022-06-02 MED ORDER — FENTANYL CITRATE (PF) 100 MCG/2ML IJ SOLN
INTRAMUSCULAR | Status: AC | PRN
  Administered 2022-06-02: 50 ug via INTRAVENOUS
  Administered 2022-06-02 (×2): 25 ug via INTRAVENOUS

## 2022-06-02 MED ORDER — CARBIDOPA-LEVODOPA ER 50-200 MG PO TBCR
1.0000 | EXTENDED_RELEASE_TABLET | Freq: Every day | ORAL | Status: DC
Start: 2022-06-02 — End: 2022-06-06
  Administered 2022-06-03 – 2022-06-05 (×3): 1 via ORAL
  Filled 2022-06-02 (×5): qty 1

## 2022-06-02 MED ORDER — MIDAZOLAM HCL 2 MG/2ML IJ SOLN
INTRAMUSCULAR | Status: AC
  Filled 2022-06-02: qty 2

## 2022-06-02 MED ORDER — MORPHINE SULFATE (PF) 2 MG/ML IV SOLN
2.0000 mg | Freq: Once | INTRAVENOUS | Status: AC
  Administered 2022-06-02: 2 mg via INTRAVENOUS
  Filled 2022-06-02: qty 1

## 2022-06-02 MED ORDER — PIPERACILLIN-TAZOBACTAM 3.375 G IVPB 30 MIN
3.3750 g | Freq: Once | INTRAVENOUS | Status: AC
  Administered 2022-06-02: 3.375 g via INTRAVENOUS
  Filled 2022-06-02: qty 50

## 2022-06-02 MED ORDER — HALOPERIDOL LACTATE 5 MG/ML IJ SOLN
2.5000 mg | Freq: Once | INTRAMUSCULAR | Status: AC
  Administered 2022-06-02: 2.5 mg via INTRAVENOUS
  Filled 2022-06-02: qty 1

## 2022-06-02 MED ORDER — FENTANYL CITRATE (PF) 100 MCG/2ML IJ SOLN
INTRAMUSCULAR | Status: AC
  Filled 2022-06-02: qty 2

## 2022-06-02 MED ORDER — CARBIDOPA-LEVODOPA 25-100 MG PO TABS
1.5000 | ORAL_TABLET | Freq: Every day | ORAL | Status: DC
  Administered 2022-06-03 – 2022-06-06 (×15): 1.5 via ORAL
  Filled 2022-06-02 (×9): qty 2
  Filled 2022-06-02: qty 1.5
  Filled 2022-06-02 (×2): qty 2
  Filled 2022-06-02: qty 1.5
  Filled 2022-06-02 (×2): qty 2
  Filled 2022-06-02: qty 1.5
  Filled 2022-06-02: qty 2

## 2022-06-02 MED ORDER — VANCOMYCIN HCL IN DEXTROSE 1-5 GM/200ML-% IV SOLN
1000.0000 mg | INTRAVENOUS | Status: DC
  Filled 2022-06-02: qty 200

## 2022-06-02 MED ORDER — LACTATED RINGERS IV SOLN
INTRAVENOUS | Status: DC
Start: 2022-06-02 — End: 2022-06-03

## 2022-06-02 MED ORDER — PIPERACILLIN-TAZOBACTAM 3.375 G IVPB
3.3750 g | Freq: Three times a day (TID) | INTRAVENOUS | Status: DC
  Administered 2022-06-03 – 2022-06-06 (×11): 3.375 g via INTRAVENOUS
  Filled 2022-06-02 (×11): qty 50

## 2022-06-02 MED ORDER — ONDANSETRON HCL 4 MG/2ML IJ SOLN
4.0000 mg | Freq: Once | INTRAMUSCULAR | Status: AC
  Administered 2022-06-02: 4 mg via INTRAVENOUS
  Filled 2022-06-02: qty 2

## 2022-06-02 NOTE — Procedures (Signed)
Interventional Radiology Procedure Note  Procedure: Percutaneous cholecystostomy  Complications: None  Estimated Blood Loss: < 10 mL  Findings: Distended, edematous GB with wall thickening and multiple pockets of pericholecystic fluid by [place].  GB contained dark bile. 10 Fr cholecystostomy tube placed and attached to gravity bag. Sample of bile sent for culture. Bile return slightly bloody after tube placement.  Jodi Marble. Fredia Sorrow, M.D Pager:  [phone]

## 2022-06-02 NOTE — ED Triage Notes (Addendum)
RLQ abdominal pain for a few days now, n/v especially while eating. Emesis looks like bile, lethargic, per facility WBC increased per paper work it is 21.  Pt is Aox4   BP- 94/62  HR-86 94% SpO2 on RA  RR- 18

## 2022-06-02 NOTE — Consult Note (Signed)
Chief Complaint: Patient was seen in consultation today for percutaneous cholecystostomy at the request of Dr. Margaretmary Eddy  Referring Physician(s): Dr. Margaretmary Eddy  Patient Status: [facility] - ED  History of Present Illness: Sean Ward is a 81 y.o. male presenting with abdominal pain, nausea, vomiting, leukocytosis and CT evidence of acute cholecystitis. Previous imaging demonstrated cholelithiasis. RUQ very tender on exam in ED. Evaluated by General Surgery and felt to be high risk for emergent cholecystectomy given anticoagulation with Eliquis and comorbidities. Worsening clinical condition in ED over several hours, becoming more obtunded with worsening VS and rising lactate.  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  Medications: Prior to Admission medications   Medication Sig Start Date End Date Taking? Authorizing Provider  apixaban (ELIQUIS) 5 MG TABS tablet TAKE 1 TABLET (5 MG TOTAL) BY MOUTH 2 (TWO) TIMES DAILY. Patient taking differently: Take 5 mg by mouth 2 (two) times daily with a meal. 05/16/22 05/16/23  Elsie Stain, MD  carbidopa-levodopa (SINEMET CR) 50-200 MG tablet TAKE 1 TABLET BY MOUTH AT BEDTIME. Patient taking differently: Take 1 tablet by mouth at bedtime. 10pm 05/16/22   Elsie Stain, MD  carbidopa-levodopa (SINEMET IR) 25-100 MG tablet TAKE 1 & 1/2 TABLETS BY  MOUTH 5 TIMES A DAY, AT 8 AM, 11 AM, 2 PM, 5 PM AND 8 PM. Patient  taking differently: Take 1.5 tablets by mouth See admin instructions. Take 1 1/2 tablet by mouth 5 times daily - 8am, 11am, 2pm, 5pm and 8pm 05/16/22 05/16/23  Elsie Stain, MD  melatonin 5 MG TABS Take 5 mg by mouth at bedtime.    [provider]  propranolol ER (INDERAL LA) 60 MG 24 hr capsule TAKE 1 CAPSULE (60 MG TOTAL) BY MOUTH DAILY. Patient taking differently: Take 60 mg by mouth at bedtime. 05/16/22   Elsie Stain, MD     Family History  Problem Relation Age of Onset   Deafness Mother    Deafness Sister    Heart disease Neg Hx     Social History   Socioeconomic History   Marital status: Legally Separated    Spouse name: Not on file   Number of children: 2   Years of education: BA   Highest education level: Not on file  Occupational History   Not on file  Tobacco Use   Smoking status: Never   Smokeless tobacco: Never  Vaping Use   Vaping Use: Never used  Substance and Sexual Activity   Alcohol use: Yes    Alcohol/week: 3.0 standard drinks of alcohol    Types: 3 Glasses of wine per week    Comment: 1 glass of red wine with dinner some nights   Drug use: No   Sexual activity: Not Currently  Other Topics Concern   Not on file  Social History Narrative   Drinks 2 cups of coffee a  day plus tea, 3 cup/day   Social Determinants of Health   Financial Resource Strain: Not on file  Food Insecurity: Not on file  Transportation Needs: Unmet Transportation Needs (05/07/2022)   PRAPARE - Administrator, Civil Service (Medical): Yes    Lack of Transportation (Non-Medical): Yes  Physical Activity: Not on file  Stress: Not on file  Social Connections: Not on file      Review of Systems: A 12 point ROS discussed and pertinent positives are indicated in the HPI above.  All other systems are negative.  Review of Systems  Gastrointestinal:  Positive for abdominal pain, nausea and vomiting.    Vital Signs: BP 98/71   Pulse 75   Temp 97.8 F  (36.6 C) (Oral)   Resp 20   SpO2 94%    Physical Exam Vitals reviewed.  Constitutional:      General: He is in acute distress.     Appearance: He is ill-appearing, toxic-appearing and diaphoretic.  Pulmonary:     Effort: Pulmonary effort is normal.  Abdominal:     General: There is no distension.     Tenderness: There is abdominal tenderness in the right upper quadrant and right lower quadrant. There is guarding.     Imaging: CT Abdomen Pelvis W Contrast  Result Date: 06/02/2022 CLINICAL DATA:  Right lower quadrant abdominal pain. EXAM: CT ABDOMEN AND PELVIS WITH CONTRAST TECHNIQUE: Multidetector CT imaging of the abdomen and pelvis was performed using the standard protocol following bolus administration of intravenous contrast. RADIATION DOSE REDUCTION: This exam was performed according to the departmental dose-optimization program which includes automated exposure control, adjustment of the mA and/or kV according to patient size and/or use of iterative reconstruction technique. CONTRAST:  27mL OMNIPAQUE IOHEXOL 300 MG/ML  SOLN COMPARISON:  CT chest abdomen and pelvis 03/30/2022 FINDINGS: Lower chest: There is atelectasis in the lung bases. Hepatobiliary: Gallstones are again seen. There is marked diffuse gallbladder wall thickening. The gallbladder is dilated. There are only patchy areas of submucosal enhancement seen in the gallbladder wall. There is mild surrounding inflammatory stranding. No biliary ductal dilatation. Liver is within normal limits. Pancreas: Unremarkable. No pancreatic ductal dilatation or surrounding inflammatory changes. Spleen: Normal in size without focal abnormality. Adrenals/Urinary Tract: There is a 2.7 cm cyst in the right kidney. Otherwise, the kidneys and adrenal glands are within normal limits. There are few punctate layering calculi in the bladder. Stomach/Bowel: Stomach is within normal limits. Appendix appears normal. No evidence of bowel wall thickening,  distention, or inflammatory changes. Vascular/Lymphatic: Aortic atherosclerosis. No enlarged abdominal or pelvic lymph nodes. Reproductive: Prostate gland is enlarged, unchanged. Other: There is a large left inguinal hernia containing nondilated bowel similar to prior study. No ascites. Musculoskeletal: There is scoliosis of the thoracolumbar spine with degenerative change. IMPRESSION: 1. Findings compatible with acute cholecystitis. Gangrenous cholecystitis is a possibility given hypoenhancement of the gallbladder wall. This can be further evaluated with ultrasound. Surgical consultation recommended. 2. Large left inguinal hernia containing nondilated bowel. 3. 2.7 cm right Bosniak 1 benign simple cyst. No follow-up imaging is recommended. JACR 2018 Feb; 264-273, Management of the Incidental RenalMass on CT, RadioGraphics 2021; 814-848, Bosniak Classification of Cystic Renal Masses, Version 2019. 4.  Aortic Atherosclerosis (ICD10-I70.0). Electronically Signed   By: Darliss Cheney M.D.   On: 06/02/2022 17:06    Labs:  CBC: Recent Labs    04/01/22 0444 04/27/22 2045 05/02/22 1920 06/02/22 1426  WBC 7.5 7.0 7.6 18.3*  HGB 13.6  15.8 14.5 15.6  HCT 39.3 47.0 44.3 44.4  PLT 203 232 208 268    COAGS: Recent Labs    03/30/22 1425 06/02/22 1905  INR 1.4* 2.7*  APTT  --  48*    BMP: Recent Labs    04/01/22 0444 04/27/22 2045 05/02/22 1920 06/02/22 1426  NA 136 138 138 136  K 3.4* 4.2 5.0 3.8  CL 103 107 106 104  CO2 25 22 20* 23  GLUCOSE 97 162* 90 170*  BUN 27* 23 25* 41*  CALCIUM 8.1* 9.1 8.8* 8.9  CREATININE 1.09 1.33* 1.21 1.17  GFRNONAA >60 54* >60 >60    LIVER FUNCTION TESTS: Recent Labs    03/30/22 1425 03/31/22 0216 04/27/22 2045 06/02/22 1426  BILITOT 2.9* 2.7* 1.7* 2.3*  AST 95* 87* 20 32  ALT 68* 13 21 12   ALKPHOS 111 95 104 100  PROT 6.0* 5.0* 6.7 5.9*  ALBUMIN 2.8* 2.4* 3.4* 2.4*    Assessment and Plan:  Acute cholecystitis and sepsis. Currently not  candidate for operative cholecystectomy. In need for emergent percutaneous cholecystostomy. Phone consent obtained from patient's ex-wife, Daron Stutz. All questions answered and phone consent witnessed and signed.   Thank you for this interesting consult.  I greatly enjoyed meeting Sean Ward and look forward to participating in their care.  A copy of this report was sent to the requesting provider on this date.  Electronically Signed: [name], MD 06/02/2022, 9:12 PM   I spent a total of 20 Minutes in face to face in clinical consultation, greater than 50% of which was counseling/coordinating care for acute cholecystitis and percutaneous cholecystostomy.

## 2022-06-02 NOTE — Progress Notes (Signed)
Pharmacy Antibiotic Note  Sean Ward is a 81 y.o. male for which pharmacy has been consulted for zosyn dosing for acute cholecystitis and sepsis  Patient with a history of  parkinson's and AF on eliquis . Patient presenting with abdominal pain, nausea, vomiting, leukocytosis and CT evidence of acute cholecystitis.  Pt is not a candidate for operative cholecystecomy and is going to IR for percutaneous chole. Vancomycin given x 1 in ED.  SCr 1.17 WBC 18.3; LA 1.8; T 98.9 F  Plan: Zosyn 3.375g IV q8h (4 hour infusion) Trend WBC, Fever, Renal function, & Clinical course F/u cultures, clinical course, WBC, fever De-escalate when able     Temp (24hrs), Avg:98.2 F (36.8 C), Min:97.8 F (36.6 C), Max:98.9 F (37.2 C)  Recent Labs  Lab 06/02/22 1426 06/02/22 1643  WBC 18.3*  --   CREATININE 1.17  --   LATICACIDVEN 1.8 2.5*    Estimated Creatinine Clearance: 49.5 mL/min (by C-G formula based on SCr of 1.17 mg/dL).    No Known Allergies  Antimicrobials this admission: zosyn 8/12 >>  Vancomycin x 1 in ED 8/12 >>  Microbiology results: Pending  Thank you for allowing pharmacy to be a part of this patient's care.  Delmar Landau, PharmD, BCPS 06/02/2022 9:32 PM ED Clinical Pharmacist -  [phone]

## 2022-06-02 NOTE — ED Provider Notes (Signed)
[facility] EMERGENCY DEPARTMENT Provider Note   CSN: [id number] Arrival date & time: 06/02/22  1357     History {Add pertinent medical, surgical, social history, OB history to HPI:1} Chief Complaint  Patient presents with   Abdominal Pain    Sean Ward is a 81 y.o. male.  81 yo M with hx of parkinson's and AF on eliquis who presents with 4 days of R sided abdominal pain. Got worse. Now can't eat. Has been vomiting frequently. Unsure the color. Says that the pain gets worse with eating, moving and sneezing. Also with chills but denies fevers. Has been also having some constipation. No diarrhea. Is on eliquis and last took this morning.    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 Authorizing Provider  apixaban (ELIQUIS) 5 MG TABS tablet TAKE 1 TABLET (5 MG TOTAL) BY MOUTH 2 (TWO) TIMES DAILY. Patient taking differently: Take 5 mg by mouth 2 (two) times daily with a meal. 05/16/22 05/16/23  [name], MD  carbidopa-levodopa (SINEMET CR) 50-200 MG tablet TAKE 1 TABLET BY MOUTH AT BEDTIME. Patient taking differently: Take 1 tablet by mouth at bedtime. 10pm 05/16/22   [name], MD  carbidopa-levodopa (SINEMET IR) 25-100 MG tablet TAKE 1 & 1/2 TABLETS BY  MOUTH 5 TIMES A DAY, AT 8 AM, 11 AM, 2 PM, 5 PM AND 8 PM. Patient taking differently: Take 1.5 tablets by mouth See admin instructions. Take 1 1/2 tablet by mouth 5 times daily - 8am, 11am, 2pm, 5pm and 8pm  05/16/22 05/16/23  [name], MD  melatonin 5 MG TABS Take 5 mg by mouth at bedtime.    [provider]  propranolol ER (INDERAL LA) 60 MG 24 hr capsule TAKE 1 CAPSULE (60 MG TOTAL) BY MOUTH DAILY. Patient taking differently: Take 60 mg by mouth at bedtime. 05/16/22   [name], MD      Allergies    Patient has no known allergies.    Review of Systems   Review of Systems  Gastrointestinal:  Positive for abdominal pain.    Physical Exam Updated Vital Signs BP 99/83   Pulse 84   Temp 98.9 F (37.2 C) (Oral)   Resp 20   SpO2 96%  Physical Exam Vitals and nursing note reviewed.  Constitutional:      General: He is not in acute distress.    Appearance: He is well-developed. He is ill-appearing.  HENT:     Head: Normocephalic and atraumatic.     Right Ear: External ear normal.     Left Ear: External ear normal.     Nose: Nose normal.     Mouth/Throat:     Mouth: Mucous membranes are moist.     Pharynx: Oropharynx is clear.  Eyes:     Extraocular Movements: Extraocular movements intact.     Conjunctiva/sclera: Conjunctivae normal.     Pupils: Pupils are equal, round,  and reactive to light.  Cardiovascular:     Rate and Rhythm: Normal rate. Rhythm irregular.     Heart sounds: Normal heart sounds.  Pulmonary:     Effort: Pulmonary effort is normal. No respiratory distress.     Breath sounds: Normal breath sounds.  Abdominal:     General: There is no distension.     Palpations: Abdomen is soft. There is no mass.     Tenderness: There is abdominal tenderness (RUQ). There is guarding (voluntary RUQ).     Hernia: A hernia (inguinal, reducible, not painful) is present.  Musculoskeletal:        General: No swelling.     Cervical back: Normal range of motion and neck supple.     Right lower leg: No edema.     Left lower leg: No edema.  Skin:    General: Skin is warm and dry.     Capillary Refill: Capillary refill takes less than 2 seconds.   Neurological:     Mental Status: He is alert. Mental status is at baseline.  Psychiatric:        Mood and Affect: Mood normal.        Behavior: Behavior normal.     ED Results / Procedures / Treatments   Labs (all labs ordered are listed, but only abnormal results are displayed) Labs Reviewed  COMPREHENSIVE METABOLIC PANEL - Abnormal; Notable for the following components:      Result Value   Glucose, Bld 170 (*)    BUN 41 (*)    Total Protein 5.9 (*)    Albumin 2.4 (*)    Total Bilirubin 2.3 (*)    All other components within normal limits  CBC - Abnormal; Notable for the following components:   WBC 18.3 (*)    All other components within normal limits  CULTURE, BLOOD (SINGLE)  LIPASE, BLOOD  LACTIC ACID, PLASMA  URINALYSIS, ROUTINE W REFLEX MICROSCOPIC  LACTIC ACID, PLASMA    EKG None  Radiology CT Abdomen Pelvis W Contrast  Result Date: 06/02/2022 CLINICAL DATA:  Right lower quadrant abdominal pain. EXAM: CT ABDOMEN AND PELVIS WITH CONTRAST TECHNIQUE: Multidetector CT imaging of the abdomen and pelvis was performed using the standard protocol following bolus administration of intravenous contrast. RADIATION DOSE REDUCTION: This exam was performed according to the departmental dose-optimization program which includes automated exposure control, adjustment of the mA and/or kV according to patient size and/or use of iterative reconstruction technique. CONTRAST:  40mL OMNIPAQUE IOHEXOL 300 MG/ML  SOLN COMPARISON:  CT chest abdomen and pelvis 03/30/2022 FINDINGS: Lower chest: There is atelectasis in the lung bases. Hepatobiliary: Gallstones are again seen. There is marked diffuse gallbladder wall thickening. The gallbladder is dilated. There are only patchy areas of submucosal enhancement seen in the gallbladder wall. There is mild surrounding inflammatory stranding. No biliary ductal dilatation. Liver is within normal limits. Pancreas: Unremarkable. No pancreatic ductal dilatation  or surrounding inflammatory changes. Spleen: Normal in size without focal abnormality. Adrenals/Urinary Tract: There is a 2.7 cm cyst in the right kidney. Otherwise, the kidneys and adrenal glands are within normal limits. There are few punctate layering calculi in the bladder. Stomach/Bowel: Stomach is within normal limits. Appendix appears normal. No evidence of bowel wall thickening, distention, or inflammatory changes. Vascular/Lymphatic: Aortic atherosclerosis. No enlarged abdominal or pelvic lymph nodes. Reproductive: Prostate gland is enlarged, unchanged. Other: There is a large left inguinal hernia containing nondilated bowel similar to prior study. No ascites. Musculoskeletal: There is scoliosis of  the thoracolumbar spine with degenerative change. IMPRESSION: 1. Findings compatible with acute cholecystitis. Gangrenous cholecystitis is a possibility given hypoenhancement of the gallbladder wall. This can be further evaluated with ultrasound. Surgical consultation recommended. 2. Large left inguinal hernia containing nondilated bowel. 3. 2.7 cm right Bosniak 1 benign simple cyst. No follow-up imaging is recommended. JACR 2018 Feb; 264-273, Management of the Incidental RenalMass on CT, [employer] 2021; 814-848, Bosniak Classification of Cystic Renal Masses, Version 2019. 4.  Aortic Atherosclerosis (ICD10-I70.0). Electronically Signed   By: Darliss Cheney M.D.   On: 06/02/2022 17:06    Procedures Procedures  {Document cardiac monitor, telemetry assessment procedure when appropriate:1}  Medications Ordered in ED Medications  piperacillin-tazobactam (ZOSYN) IVPB 4.5 g (has no administration in time range)  vancomycin (VANCOCIN) IVPB 1000 mg/200 mL premix (has no administration in time range)  iohexol (OMNIPAQUE) 300 MG/ML solution 80 mL (80 mLs Intravenous Contrast Given 06/02/22 1615)    ED Course/ Medical Decision Making/ A&P                           Medical Decision Making Amount and/or  Complexity of Data Reviewed Labs: ordered.  Risk Prescription drug management.   ***  {Document critical care time when appropriate:1} {Document review of labs and clinical decision tools ie heart score, Chads2Vasc2 etc:1}  {Document your independent review of radiology images, and any outside records:1} {Document your discussion with family members, caretakers, and with consultants:1} {Document social determinants of health affecting pt's care:1} {Document your decision making why or why not admission, treatments were needed:1} Final Clinical Impression(s) / ED Diagnoses Final diagnoses:  None    Rx / DC Orders ED Discharge Orders     None

## 2022-06-02 NOTE — H&P (Signed)
History and Physical    Sean Ward TKP:[id number] DOB: 10/04/1941 DOA: 06/02/2022  PCP: [name], MD  Patient coming from: [facility].  Chief Complaint: Abdominal pain.  HPI: Sean Ward is a 81 y.o. male with history of A-fib, Parkinson's disease has been experiencing abdominal pain for the last 1 week with associated nausea and vomiting.  Most of the history was obtained from the ER physician since at the time of my exam he became more encephalopathic.  ED Course: In the ER patient had CT scan of the abdomen pelvis which shows features concerning for gangrenous cholecystitis.  Lactic acid was elevated was given fluid bolus.  General surgeon was consulted Dr. Derrell Lolling general surgeon requested IR consult.  Patient is in the process of getting cholecystostomy tube placed and taken to the procedure room.  Patient last dose of Eliquis was this morning as per the report.  Labs also show significant leukocytosis.  Review of Systems: As per HPI, rest all negative.   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 He has never used smokeless tobacco. He reports current alcohol use of about 3.0 standard drinks of alcohol per week. He reports that he does not use drugs.  No Known Allergies  Family History  Problem Relation Age of Onset   Deafness Mother    Deafness Sister    Heart disease Neg Hx     Prior to Admission medications   Medication Sig Start Date End Date  Taking? Authorizing Provider  apixaban (ELIQUIS) 5 MG TABS tablet TAKE 1 TABLET (5 MG TOTAL) BY MOUTH 2 (TWO) TIMES DAILY. Patient taking differently: Take 5 mg by mouth 2 (two) times daily with a meal. 05/16/22 05/16/23  [name], MD  carbidopa-levodopa (SINEMET CR) 50-200 MG tablet TAKE 1 TABLET BY MOUTH AT BEDTIME. Patient taking differently: Take 1 tablet by mouth at bedtime. 10pm 05/16/22   [name], MD  carbidopa-levodopa (SINEMET IR) 25-100 MG tablet TAKE 1 & 1/2 TABLETS BY  MOUTH 5 TIMES A DAY, AT 8 AM, 11 AM, 2 PM, 5 PM AND 8 PM. Patient taking differently: Take 1.5 tablets by mouth See admin instructions. Take 1 1/2 tablet by mouth 5 times daily - 8am, 11am, 2pm, 5pm and 8pm 05/16/22 05/16/23  [name], MD  melatonin 5 MG TABS Take 5 mg by mouth at bedtime.    [provider]  propranolol ER (INDERAL LA) 60 MG 24 hr capsule TAKE 1 CAPSULE (60 MG TOTAL) BY MOUTH DAILY. Patient taking differently: Take 60 mg by mouth at bedtime. 05/16/22   [name], MD    Physical Exam: Constitutional: Moderately built and nourished. Vitals:   06/02/22 2055 06/02/22 2100 06/02/22 2102 06/02/22 2125  BP: 91/72 98/71  116/73  Pulse: 78 75  78  Resp: 11 20  17   Temp:  98 F (36.7 C) 97.8 F (  36.6 C)   TempSrc:  Oral Oral   SpO2: 96% 94%  96%   Eyes: Anicteric no pallor. ENMT: No discharge from the ears eyes nose and mouth. Neck: Mass felt.  No neck rigidity. Respiratory: No rhonchi or crepitations. Cardiovascular: S1-S2 heard. Abdomen: Soft nontender without guarding or rigidity. Musculoskeletal: No edema. Skin: No rash. Neurologic: Alert awake oriented to his name only.  Appears confused.  Moving all extremities. Psychiatric: Appears confused.   Labs on Admission: I have personally reviewed following labs and imaging studies  CBC: Recent Labs  Lab 06/02/22 1426  WBC 18.3*  HGB 15.6  HCT 44.4  MCV 91.4  PLT 268   Basic Metabolic  Panel: Recent Labs  Lab 06/02/22 1426  NA 136  K 3.8  CL 104  CO2 23  GLUCOSE 170*  BUN 41*  CREATININE 1.17  CALCIUM 8.9   GFR: Estimated Creatinine Clearance: 49.5 mL/min (by C-G formula based on SCr of 1.17 mg/dL). Liver Function Tests: Recent Labs  Lab 06/02/22 1426  AST 32  ALT 12  ALKPHOS 100  BILITOT 2.3*  PROT 5.9*  ALBUMIN 2.4*   Recent Labs  Lab 06/02/22 1426  LIPASE 29   No results for input(s): "AMMONIA" in the last 168 hours. Coagulation Profile: Recent Labs  Lab 06/02/22 1905  INR 2.7*   Cardiac Enzymes: No results for input(s): "CKTOTAL", "CKMB", "CKMBINDEX", "TROPONINI" in the last 168 hours. BNP (last 3 results) No results for input(s): "PROBNP" in the last 8760 hours. HbA1C: No results for input(s): "HGBA1C" in the last 72 hours. CBG: No results for input(s): "GLUCAP" in the last 168 hours. Lipid Profile: No results for input(s): "CHOL", "HDL", "LDLCALC", "TRIG", "CHOLHDL", "LDLDIRECT" in the last 72 hours. Thyroid Function Tests: No results for input(s): "TSH", "T4TOTAL", "FREET4", "T3FREE", "THYROIDAB" in the last 72 hours. Anemia Panel: No results for input(s): "VITAMINB12", "FOLATE", "FERRITIN", "TIBC", "IRON", "RETICCTPCT" in the last 72 hours. Urine analysis:    Component Value Date/Time   COLORURINE YELLOW 03/30/2022 1821   APPEARANCEUR HAZY (A) 03/30/2022 1821   APPEARANCEUR Clear 08/21/2017 1601   LABSPEC >1.046 (H) 03/30/2022 1821   PHURINE 6.0 03/30/2022 1821   GLUCOSEU NEGATIVE 03/30/2022 1821   HGBUR LARGE (A) 03/30/2022 1821   BILIRUBINUR NEGATIVE 03/30/2022 1821   BILIRUBINUR Negative 08/21/2017 1601   KETONESUR 20 (A) 03/30/2022 1821   PROTEINUR 30 (A) 03/30/2022 1821   NITRITE NEGATIVE 03/30/2022 1821   LEUKOCYTESUR NEGATIVE 03/30/2022 1821   Sepsis Labs: @LABRCNTIP (procalcitonin:4,lacticidven:4) )No results found for this or any previous visit (from the past 240 hour(s)).   Radiological Exams on  Admission: CT Abdomen Pelvis W Contrast  Result Date: 06/02/2022 CLINICAL DATA:  Right lower quadrant abdominal pain. EXAM: CT ABDOMEN AND PELVIS WITH CONTRAST TECHNIQUE: Multidetector CT imaging of the abdomen and pelvis was performed using the standard protocol following bolus administration of intravenous contrast. RADIATION DOSE REDUCTION: This exam was performed according to the departmental dose-optimization program which includes automated exposure control, adjustment of the mA and/or kV according to patient size and/or use of iterative reconstruction technique. CONTRAST:  77mL OMNIPAQUE IOHEXOL 300 MG/ML  SOLN COMPARISON:  CT chest abdomen and pelvis 03/30/2022 FINDINGS: Lower chest: There is atelectasis in the lung bases. Hepatobiliary: Gallstones are again seen. There is marked diffuse gallbladder wall thickening. The gallbladder is dilated. There are only patchy areas of submucosal enhancement seen in the gallbladder wall. There is mild surrounding inflammatory stranding. No biliary ductal dilatation. Liver is within normal limits. Pancreas:  Unremarkable. No pancreatic ductal dilatation or surrounding inflammatory changes. Spleen: Normal in size without focal abnormality. Adrenals/Urinary Tract: There is a 2.7 cm cyst in the right kidney. Otherwise, the kidneys and adrenal glands are within normal limits. There are few punctate layering calculi in the bladder. Stomach/Bowel: Stomach is within normal limits. Appendix appears normal. No evidence of bowel wall thickening, distention, or inflammatory changes. Vascular/Lymphatic: Aortic atherosclerosis. No enlarged abdominal or pelvic lymph nodes. Reproductive: Prostate gland is enlarged, unchanged. Other: There is a large left inguinal hernia containing nondilated bowel similar to prior study. No ascites. Musculoskeletal: There is scoliosis of the thoracolumbar spine with degenerative change. IMPRESSION: 1. Findings compatible with acute cholecystitis.  Gangrenous cholecystitis is a possibility given hypoenhancement of the gallbladder wall. This can be further evaluated with ultrasound. Surgical consultation recommended. 2. Large left inguinal hernia containing nondilated bowel. 3. 2.7 cm right Bosniak 1 benign simple cyst. No follow-up imaging is recommended. JACR 2018 Feb; 264-273, Management of the Incidental RenalMass on CT, [employer] 2021; 814-848, Bosniak Classification of Cystic Renal Masses, Version 2019. 4.  Aortic Atherosclerosis (ICD10-I70.0). Electronically Signed   By: Darliss Cheney M.D.   On: 06/02/2022 17:06      Assessment/Plan Principal Problem:   Acute cholecystitis Active Problems:   Atrial fibrillation (HCC)   Parkinson disease (HCC)   Sepsis (HCC)    Sepsis secondary to acute gangrenous cholecystitis -appreciate general surgery and IR consult.  Patient is in the process of going to the procedure for cholecystostomy tube drain placement.  Continue with empiric antibiotics fluids follow cultures. History of A-fib presently heart rate is in sinus rhythm and around 60 bpm.  His blood pressure is on the lower side we will hold propanolol.  Patient's Eliquis is on hold due to procedure. 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   Since patient has sepsis from cholecystitis will need close monitoring and further management inpatient status.   DVT prophylaxis: SCDs.  Avoiding anticoagulation in anticipation of procedure. Code Status: Full code. Family Communication: Patient's ex-wife was only person available for communication. Disposition Plan: To be determined. Consults called: Surgery and IR consult. Admission status: Inpatient.   Eduard Clos MD [facility] Pager [phone].  If 7PM-7AM, please contact night-coverage [email or web address] Password [facility]  06/02/2022, 9:30 PM

## 2022-06-02 NOTE — Consult Note (Signed)
Reason for Consult: Cholecystitis Referring Physician: Dr. Waverly Ferrari Sean Ward is an 81 y.o. male.  HPI: Patient is a 81 year old male who comes in secondary to abdominal pain x7 days.  Patient states he had associated nausea vomiting denies diarrhea at his rehab.  Patient presented to the ER and underwent CT scan.  CT scan was significant for questionable gangrenous cholecystitis.  Patient with leukocytosis, T. bili of 2.3.  Of note patient on Eliquis for A-fib, last took his dose 06/02/2022.  Patient denies any other abdominal surgery.  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 He has never used smokeless tobacco. He reports current alcohol use of about 3.0 standard drinks of alcohol per week. He reports that he does not use drugs.  Allergies: No Known Allergies  Medications: I have reviewed the patient's current medications.  Results for orders placed or performed during the hospital encounter of 06/02/22 (from the past 48 hour(s))  Lipase, blood     Status: None   Collection Time: 06/02/22  2:26 PM  Result Value Ref Range   Lipase 29 11 - 51 U/L    Comment: Performed at [facility], [address], [place], [place] [address]  Comprehensive metabolic panel     Status: Abnormal   Collection Time: 06/02/22  2:26 PM  Result Value Ref  Range   Sodium 136 135 - 145 mmol/L   Potassium 3.8 3.5 - 5.1 mmol/L   Chloride 104 98 - 111 mmol/L   CO2 23 22 - 32 mmol/L   Glucose, Bld 170 (H) 70 - 99 mg/dL    Comment: Glucose reference range applies only to samples taken after fasting for at least 8 hours.   BUN 41 (H) 8 - 23 mg/dL   Creatinine, Ser 3.15 0.61 - 1.24 mg/dL   Calcium 8.9 8.9 - 17.6 mg/dL   Total Protein 5.9 (L) 6.5 - 8.1 g/dL   Albumin 2.4 (L) 3.5 - 5.0 g/dL   AST 32 15 - 41 U/L   ALT 12 0 - 44 U/L   Alkaline Phosphatase 100 38 - 126 U/L   Total Bilirubin 2.3 (H) 0.3 - 1.2 mg/dL   GFR, Estimated >16 >07 mL/min    Comment: (NOTE) Calculated using the CKD-EPI Creatinine Equation (2021)    Anion gap 9 5 - 15    Comment: Performed at [facility], [address], [place], [place] [address]  CBC     Status: Abnormal   Collection Time: 06/02/22  2:26 PM  Result Value Ref Range   WBC 18.3 (H) 4.0 - 10.5 K/uL   RBC 4.86 4.22 - 5.81 MIL/uL   Hemoglobin 15.6 13.0 - 17.0 g/dL   HCT  44.4 39.0 - 52.0 %   MCV 91.4 80.0 - 100.0 fL   MCH 32.1 26.0 - 34.0 pg   MCHC 35.1 30.0 - 36.0 g/dL   RDW 56.2 56.3 - 89.3 %   Platelets 268 150 - 400 K/uL   nRBC 0.0 0.0 - 0.2 %    Comment: Performed at [facility], [address], [place], [place] [address]  Lactic acid, plasma     Status: None   Collection Time: 06/02/22  2:26 PM  Result Value Ref Range   Lactic Acid, Venous 1.8 0.5 - 1.9 mmol/L    Comment: Performed at [facility], [address] [place], [place] [address]  Lactic acid, plasma     Status: Abnormal   Collection Time: 06/02/22  4:43 PM  Result Value Ref Range   Lactic Acid, Venous 2.5 (HH) 0.5 - 1.9 mmol/L    Comment: CRITICAL RESULT CALLED TO, READ BACK BY AND VERIFIED WITH P,BLANCHARD RN @1759  06/02/22 E,BENTON Performed at [facility], [address], [place], [place]     CT Abdomen Pelvis W Contrast  Result Date: 06/02/2022 CLINICAL DATA:  Right lower quadrant  abdominal pain. EXAM: CT ABDOMEN AND PELVIS WITH CONTRAST TECHNIQUE: Multidetector CT imaging of the abdomen and pelvis was performed using the standard protocol following bolus administration of intravenous contrast. RADIATION DOSE REDUCTION: This exam was performed according to the departmental dose-optimization program which includes automated exposure control, adjustment of the mA and/or kV according to patient size and/or use of iterative reconstruction technique. CONTRAST:  23mL OMNIPAQUE IOHEXOL 300 MG/ML  SOLN COMPARISON:  CT chest abdomen and pelvis 03/30/2022 FINDINGS: Lower chest: There is atelectasis in the lung bases. Hepatobiliary: Gallstones are again seen. There is marked diffuse gallbladder wall thickening. The gallbladder is dilated. There are only patchy areas of submucosal enhancement seen in the gallbladder wall. There is mild surrounding inflammatory stranding. No biliary ductal dilatation. Liver is within normal limits. Pancreas: Unremarkable. No pancreatic ductal dilatation or surrounding inflammatory changes. Spleen: Normal in size without focal abnormality. Adrenals/Urinary Tract: There is a 2.7 cm cyst in the right kidney. Otherwise, the kidneys and adrenal glands are within normal limits. There are few punctate layering calculi in the bladder. Stomach/Bowel: Stomach is within normal limits. Appendix appears normal. No evidence of bowel wall thickening, distention, or inflammatory changes. Vascular/Lymphatic: Aortic atherosclerosis. No enlarged abdominal or pelvic lymph nodes. Reproductive: Prostate gland is enlarged, unchanged. Other: There is a large left inguinal hernia containing nondilated bowel similar to prior study. No ascites. Musculoskeletal: There is scoliosis of the thoracolumbar spine with degenerative change. IMPRESSION: 1. Findings compatible with acute cholecystitis. Gangrenous cholecystitis is a possibility given hypoenhancement of the gallbladder wall. This can be further  evaluated with ultrasound. Surgical consultation recommended. 2. Large left inguinal hernia containing nondilated bowel. 3. 2.7 cm right Bosniak 1 benign simple cyst. No follow-up imaging is recommended. JACR 2018 Feb; 264-273, Management of the Incidental RenalMass on CT, [employer] 2021; 814-848, Bosniak Classification of Cystic Renal Masses, Version 2019. 4.  Aortic Atherosclerosis (ICD10-I70.0). Electronically Signed   By: 2020 M.D.   On: 06/02/2022 17:06    Review of Systems  Constitutional:  Negative for chills and fever.  HENT:  Negative for ear discharge, hearing loss and sore throat.   Eyes:  Negative for discharge.  Respiratory:  Negative for cough and shortness of breath.   Cardiovascular:  Negative for chest pain and leg swelling.  Gastrointestinal:  Positive for abdominal pain, nausea and vomiting. Negative for constipation and diarrhea.  Musculoskeletal:  Negative for myalgias and neck pain.  Skin:  Negative for rash.  Allergic/Immunologic: Negative for environmental allergies.  Neurological:  Negative for dizziness and seizures.  Hematological:  Does not bruise/bleed easily.  Psychiatric/Behavioral:  Negative for suicidal ideas.   All other systems reviewed and are negative.  Blood pressure 115/81, pulse 81, temperature 98.9 F (37.2 C), temperature source Oral, resp. rate 19, SpO2 96 %. Physical Exam Constitutional:      Appearance: He is well-developed.     Comments: Conversant No acute distress  HENT:     Head: Normocephalic and atraumatic.  Eyes:     General: Lids are normal. No scleral icterus.    Pupils: Pupils are equal, round, and reactive to light.     Comments: Pupils are equal round and reactive No lid lag Moist conjunctiva  Neck:     Thyroid: No thyromegaly.     Trachea: No tracheal tenderness.     Comments: No cervical lymphadenopathy Cardiovascular:     Rate and Rhythm: Normal rate and regular rhythm.     Heart sounds: No murmur  heard. Pulmonary:     Effort: Pulmonary effort is normal.     Breath sounds: Normal breath sounds. No wheezing or rales.  Abdominal:     Tenderness: There is abdominal tenderness in the right upper quadrant. There is guarding. There is no rebound.     Hernia: No hernia is present.  Musculoskeletal:     Cervical back: Normal range of motion and neck supple.  Skin:    General: Skin is warm.     Findings: No rash.     Nails: There is no clubbing.     Comments: Normal skin turgor  Neurological:     Mental Status: He is alert and oriented to person, place, and time.     Comments: Normal gait and station  Psychiatric:        Mood and Affect: Mood normal.        Thought Content: Thought content normal.        Judgment: Judgment normal.     Comments: Appropriate affect     Assessment/Plan: 80 year old male with acute cholecystitis. History of Parkinson's History of A-fib on Eliquis Dyslipidemia   1.  Would recommend IR consult for [facility] cholecystostomy tube.  Patient at this time would like to avoid surgery. 2.  Okay for clears at this point.  Would make n.p.o. after midnight.  Okay to resume liquids after IR procedure. 3.  We will follow.  Axel Filler 06/02/2022, 6:27 PM

## 2022-06-02 NOTE — Sedation Documentation (Addendum)
Notable dark bloody drainage from gravity bag post chole tube insertion. Output less than 100 ml

## 2022-06-02 NOTE — ED Notes (Signed)
Patient heard calling for help from room. RN entered room and observes patient holding on to hand rails of the bed. Patient reports to RN that he feels like he is floating and that she is upside down. Patient asked RN how he can prove that he is in the bed. EDP aware.

## 2022-06-02 NOTE — Sedation Documentation (Signed)
Procedure completed. Patient more alert having increased pain. Ok per Dr. Fredia Sorrow to give more medications at this time. Addition 1 mg versed and 50 mcg fentanyl to be given.

## 2022-06-02 NOTE — ED Provider Triage Note (Signed)
Emergency Medicine Provider Triage Evaluation Note  Sean Ward , a 81 y.o. male  was evaluated in triage.  Pt complains of RLQ and lethargic for the last 7 days.  With decreased appetite.  When he breathes that area of his stomach hurts.  Denies changes in urinary or bowel habits.  Unsure fevers, endorses chills.  No Hx of abdominal surgeries.  Review of Systems  Positive:  Negative: See above  Physical Exam  BP 93/70 (BP Location: Right Arm)   Pulse 78   Temp 98.9 F (37.2 C) (Oral)   Resp 16   SpO2 93%  Gen:   Awake, appears fatigued, mildly lethargic Resp:  Normal effort  MSK:   Moves extremities with some difficulty, appears to be due to weakness Other:  Exquisite RLQ tenderness  Medical Decision Making  Medically screening exam initiated at 2:50 PM.  Appropriate orders placed.  [place] Festa was informed that the remainder of the evaluation will be completed by another provider, this initial triage assessment does not replace that evaluation, and the importance of remaining in the ED until their evaluation is complete.     Cecil Cobbs, PA-C 06/02/22 1456

## 2022-06-03 DIAGNOSIS — K810 Acute cholecystitis: Secondary | ICD-10-CM | POA: Diagnosis not present

## 2022-06-03 LAB — CBC WITH DIFFERENTIAL/PLATELET
Abs Immature Granulocytes: 0.12 10*3/uL — ABNORMAL HIGH (ref 0.00–0.07)
Basophils Absolute: 0 10*3/uL (ref 0.0–0.1)
Basophils Relative: 0 %
Eosinophils Absolute: 0 10*3/uL (ref 0.0–0.5)
Eosinophils Relative: 0 %
HCT: 41.1 % (ref 39.0–52.0)
Hemoglobin: 14.2 g/dL (ref 13.0–17.0)
Immature Granulocytes: 1 %
Lymphocytes Relative: 4 %
Lymphs Abs: 0.6 10*3/uL — ABNORMAL LOW (ref 0.7–4.0)
MCH: 31.9 pg (ref 26.0–34.0)
MCHC: 34.5 g/dL (ref 30.0–36.0)
MCV: 92.4 fL (ref 80.0–100.0)
Monocytes Absolute: 1.3 10*3/uL — ABNORMAL HIGH (ref 0.1–1.0)
Monocytes Relative: 9 %
Neutro Abs: 12.6 10*3/uL — ABNORMAL HIGH (ref 1.7–7.7)
Neutrophils Relative %: 86 %
Platelets: 225 10*3/uL (ref 150–400)
RBC: 4.45 MIL/uL (ref 4.22–5.81)
RDW: 15.5 % (ref 11.5–15.5)
WBC: 14.7 10*3/uL — ABNORMAL HIGH (ref 4.0–10.5)
nRBC: 0 % (ref 0.0–0.2)

## 2022-06-03 LAB — BASIC METABOLIC PANEL
Anion gap: 9 (ref 5–15)
BUN: 37 mg/dL — ABNORMAL HIGH (ref 8–23)
CO2: 23 mmol/L (ref 22–32)
Calcium: 8.3 mg/dL — ABNORMAL LOW (ref 8.9–10.3)
Chloride: 105 mmol/L (ref 98–111)
Creatinine, Ser: 1.12 mg/dL (ref 0.61–1.24)
GFR, Estimated: >60 mL/min (ref >60)
Glucose, Bld: 117 mg/dL — ABNORMAL HIGH (ref 70–99)
Potassium: 3.6 mmol/L (ref 3.5–5.1)
Sodium: 137 mmol/L (ref 135–145)

## 2022-06-03 LAB — HEPATIC FUNCTION PANEL
ALT: 6 U/L (ref 0–44)
AST: 21 U/L (ref 15–41)
Albumin: 2 g/dL — ABNORMAL LOW (ref 3.5–5.0)
Alkaline Phosphatase: 87 U/L (ref 38–126)
Bilirubin, Direct: 0.5 mg/dL — ABNORMAL HIGH (ref 0.0–0.2)
Indirect Bilirubin: 1.4 mg/dL — ABNORMAL HIGH (ref 0.3–0.9)
Total Bilirubin: 1.9 mg/dL — ABNORMAL HIGH (ref 0.3–1.2)
Total Protein: 5.1 g/dL — ABNORMAL LOW (ref 6.5–8.1)

## 2022-06-03 LAB — URINALYSIS, ROUTINE W REFLEX MICROSCOPIC
Bacteria, UA: NONE SEEN
Bilirubin Urine: NEGATIVE
Glucose, UA: NEGATIVE mg/dL
Hgb urine dipstick: NEGATIVE
Ketones, ur: NEGATIVE mg/dL
Leukocytes,Ua: NEGATIVE
Nitrite: NEGATIVE
Protein, ur: 30 mg/dL — AB
Specific Gravity, Urine: >1.046 — ABNORMAL HIGH (ref 1.005–1.030)
pH: 5 (ref 5.0–8.0)

## 2022-06-03 LAB — MRSA NEXT GEN BY PCR, NASAL: MRSA by PCR Next Gen: NOT DETECTED

## 2022-06-03 LAB — HEPARIN LEVEL (UNFRACTIONATED): Heparin Unfractionated: >1.1 IU/mL — ABNORMAL HIGH (ref 0.30–0.70)

## 2022-06-03 LAB — GLUCOSE, CAPILLARY: Glucose-Capillary: 128 mg/dL — ABNORMAL HIGH (ref 70–99)

## 2022-06-03 LAB — APTT: aPTT: 98 seconds — ABNORMAL HIGH (ref 24–36)

## 2022-06-03 MED ORDER — LACTATED RINGERS IV BOLUS
500.0000 mL | Freq: Once | INTRAVENOUS | Status: AC
  Administered 2022-06-03: 500 mL via INTRAVENOUS

## 2022-06-03 MED ORDER — HEPARIN (PORCINE) 25000 UT/250ML-% IV SOLN
1100.0000 [IU]/h | INTRAVENOUS | Status: DC
  Administered 2022-06-03 – 2022-06-05 (×3): 1100 [IU]/h via INTRAVENOUS
  Filled 2022-06-03 (×4): qty 250

## 2022-06-03 MED ORDER — LACTATED RINGERS IV BOLUS
1000.0000 mL | Freq: Once | INTRAVENOUS | Status: AC
  Administered 2022-06-03: 1000 mL via INTRAVENOUS

## 2022-06-03 MED ORDER — KCL IN DEXTROSE-NACL 20-5-0.9 MEQ/L-%-% IV SOLN
INTRAVENOUS | Status: DC
  Filled 2022-06-03 (×2): qty 1000

## 2022-06-03 MED ORDER — SODIUM CHLORIDE 0.9% FLUSH
5.0000 mL | Freq: Three times a day (TID) | INTRAVENOUS | Status: DC
  Administered 2022-06-03 – 2022-06-06 (×9): 5 mL

## 2022-06-03 MED ORDER — PANTOPRAZOLE SODIUM 40 MG PO TBEC
40.0000 mg | DELAYED_RELEASE_TABLET | Freq: Every day | ORAL | Status: DC
  Administered 2022-06-03 – 2022-06-06 (×4): 40 mg via ORAL
  Filled 2022-06-03 (×4): qty 1

## 2022-06-03 MED ORDER — KCL IN DEXTROSE-NACL 20-5-0.9 MEQ/L-%-% IV SOLN
INTRAVENOUS | Status: DC
  Filled 2022-06-03: qty 1000

## 2022-06-03 MED ORDER — METOPROLOL TARTRATE 5 MG/5ML IV SOLN: 5.0000 mg | INTRAVENOUS | Status: DC | PRN

## 2022-06-03 MED ORDER — HEPARIN SODIUM (PORCINE) 5000 UNIT/ML IJ SOLN: 5000.0000 [IU] | Freq: Three times a day (TID) | INTRAMUSCULAR | Status: DC

## 2022-06-03 MED ORDER — MIDODRINE HCL 5 MG PO TABS
10.0000 mg | ORAL_TABLET | Freq: Three times a day (TID) | ORAL | Status: DC
  Administered 2022-06-03 – 2022-06-05 (×6): 10 mg via ORAL
  Filled 2022-06-03 (×6): qty 2

## 2022-06-03 MED ORDER — LACTATED RINGERS IV BOLUS
250.0000 mL | Freq: Once | INTRAVENOUS | Status: AC
  Administered 2022-06-03: 250 mL via INTRAVENOUS

## 2022-06-03 NOTE — Progress Notes (Signed)
Sent Dr. Thedore Mins secure chat and made MD aware that patient is complaining of feeling dizzy so CBG checked and is 128 and BP 100/74. MD gave order for 250 cc LR bolus and thigh high ted hose.

## 2022-06-03 NOTE — Progress Notes (Signed)
PROGRESS NOTE                                                                                                                                                                                                             Patient Demographics:    Sean Ward, is a 81 y.o. male, DOB - [birth year], W[place]:[id number]  Outpatient Primary MD for the patient is Elsie Stain, MD    LOS - 1  Admit date - 06/02/2022    Chief Complaint  Patient presents with   Abdominal Pain       Brief Narrative (HPI from H&P)   81 y.o. male with history of A-fib, Parkinson's disease has been experiencing abdominal pain for the last 1 week with associated nausea and vomiting, in the ER he was diagnosed with acute cholecystitis with sepsis, he was seen by general surgery who recommended that patient be referred for cholecystostomy drain placement, he was admitted by hospitalist team for further care.   Subjective:    Sean Ward today has, No headache, No chest pain, mild abdominal pain - No Nausea, No new weakness tingling or numbness, no SOB.   Assessment  & Plan :    Sepsis secondary to acute gangrenous cholecystitis s/p cholecystostomy drain placed by IR on 06/02/2022. He been seen by IR and general surgery, he has undergone cholecystostomy drain placement, sepsis pathophysiology is resolving, continue empiric IV antibiotics, aggressive IV fluid bolus and maintenance, monitor cultures, MRSA nasal PCR negative.  Drain care per IR - flush drain QD with 5 cc NS, record output QD, dressing changes every 2-3 days or earlier if soiled.    History of paroxysmal A-fib presently heart rate is in sinus rhythm and around 60 bpm.  Mali vasc 2 score of greater than 3.  Eliquis on hold, will place him on heparin drip now on bolus for the next 1 to 2 days thereafter resume Eliquis.  Will add as needed IV Lopressor.   History of Parkinson's disease we will  continue Sinemet.  At risk for delirium, minimize narcotics and benzodiazepines      Condition - Extremely Guarded  Family Communication  :    Called wife Katharine Look [phone] on 06/03/2022 message left at 7:50 AM  Code Status :  Full  Consults  :  CCS, IR  PUD Prophylaxis : PPI  Procedures  :     CT - 1. Findings compatible with acute cholecystitis. Gangrenous cholecystitis is a possibility given hypoenhancement of the gallbladder wall. This can be further evaluated with ultrasound. Surgical consultation recommended. 2. Large left inguinal hernia containing nondilated bowel. 3. 2.7 cm right Bosniak 1 benign simple cyst. No follow-up imaging is recommended.  Cholecystostomy drain placed by IR on 06/02/2022.      Disposition Plan  :    Status is: Inpatient  DVT Prophylaxis  :  Heparin added  SCDs Start: 06/02/22 2130    Lab Results  Component Value Date   PLT 225 06/03/2022    Diet :  Diet Order             Diet clear liquid Room service appropriate? Yes; Fluid consistency: Thin  Diet effective now                    Inpatient Medications  Scheduled Meds:  carbidopa-levodopa  1 tablet Oral QHS   carbidopa-levodopa  1.5 tablet Oral 5 X Daily   sodium chloride flush  5 mL Intracatheter Q8H   Continuous Infusions:  dextrose 5 % and 0.9 % NaCl with KCl 20 mEq/L 75 mL/hr at 06/03/22 1030   piperacillin-tazobactam (ZOSYN)  IV Stopped (06/03/22 [id number])   PRN Meds:.  Antibiotics  :    Anti-infectives (From admission, onward)    Start     Dose/Rate Route Frequency Ordered Stop   06/03/22 0200  piperacillin-tazobactam (ZOSYN) IVPB 3.375 g        3.375 g 12.5 mL/hr over 240 Minutes Intravenous Every 8 hours 06/02/22 2132     06/02/22 1930  piperacillin-tazobactam (ZOSYN) IVPB 3.375 g        3.375 g 100 mL/hr over 30 Minutes Intravenous  Once 06/02/22 1927 06/02/22 2008   06/02/22 1930  vancomycin (VANCOREADY) IVPB 1500 mg/300 mL        1,500 mg 150 mL/hr  over 120 Minutes Intravenous  Once 06/02/22 1930 06/02/22 2145   06/02/22 1730  piperacillin-tazobactam (ZOSYN) IVPB 4.5 g  Status:  Discontinued        4.5 g 200 mL/hr over 30 Minutes Intravenous STAT 06/02/22 1722 06/02/22 1929   06/02/22 1730  vancomycin (VANCOCIN) IVPB 1000 mg/200 mL premix  Status:  Discontinued        1,000 mg 200 mL/hr over 60 Minutes Intravenous STAT 06/02/22 1722 06/02/22 1930        Time Spent in minutes  30   Lala Lund M.D on 06/03/2022 at 10:59 AM  To page go to [email or web address]   [facility] -  Office  [phone]  See all Orders from today for further details    Objective:   Vitals:   06/03/22 0145 06/03/22 0245 06/03/22 0811 06/03/22 0936  BP: 91/65 98/71 96/64  91/72  Pulse: 79 77 74 78  Resp: 15 15 15 15   Temp:  98.4 F (36.9 C) 97.6 F (36.4 C) 97.8 F (36.6 C)  TempSrc:  Axillary Axillary Oral  SpO2: 100% 96% 97% 98%    Wt Readings from Last 3 Encounters:  05/21/22 72.6 kg  05/02/22 74.2 kg  04/17/22 74.2 kg     Intake/Output Summary (Last 24 hours) at 06/03/2022 1059 Last data filed at 06/03/2022 1003 Gross per 24 hour  Intake 1030 ml  Output 450 ml  Net 580 ml     Physical Exam  Awake, minimally confused,, No new F.N deficits, right upper quadrant gallbladder drain  in place, 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 (Active)  03/30/22 2100  Location: Hip  Location Orientation: Posterior;Proximal;Right  Staging: Stage 2 -  Partial thickness loss of dermis presenting as a shallow open injury with a red, pink  wound bed without slough.  Wound Description (Comments):   Present on Admission: Yes     Pressure Injury 03/30/22 Lumbar Lateral;Right Stage 1 -  Intact skin with non-blanchable redness of a localized area usually over a bony prominence. (Active)  03/30/22 2100  Location: Lumbar  Location Orientation: Lateral;Right  Staging: Stage 1 -  Intact skin with non-blanchable redness of a localized area usually over a bony prominence.  Wound Description (Comments):   Present on Admission: Yes     Pressure Injury 03/31/22 Lumbar Right Stage 2 -  Partial thickness loss of dermis presenting as a shallow open injury with a red, pink wound bed without slough. (Active)  03/31/22   Location: Lumbar  Location Orientation: Right  Staging: Stage 2 -  Partial thickness loss of dermis presenting as a shallow open injury with a red, pink wound bed without slough.  Wound Description (Comments):   Present on Admission:      Data Review:    CBC Recent Labs  Lab 06/02/22 1426 06/03/22 0155  WBC 18.3* 14.7*  HGB 15.6 14.2  HCT 44.4 41.1  PLT 268 225  MCV 91.4 92.4  MCH 32.1 31.9  MCHC 35.1 34.5  RDW 15.5 15.5  LYMPHSABS  --  0.6*  MONOABS  --  1.3*  EOSABS  --  0.0  BASOSABS  --  0.0    Electrolytes Recent Labs  Lab 06/02/22 1426 06/02/22 1643 06/02/22 1905 06/02/22 2042 06/03/22 0155  NA 136  --   --   --  137  K 3.8  --   --   --  3.6  CL 104  --   --   --  105  CO2 23  --   --   --  23  GLUCOSE 170*  --   --   --  117*  BUN 41*  --   --   --  37*  CREATININE 1.17  --   --   --  1.12  CALCIUM 8.9  --   --   --  8.3*  AST 32  --   --   --  21  ALT 12  --   --   --  6  ALKPHOS 100  --   --   --  87  BILITOT 2.3*  --   --   --  1.9*  ALBUMIN 2.4*  --   --   --  2.0*  LATICACIDVEN 1.8 2.5*  --  1.8  --   INR  --   --  2.7*  --   --     ------------------------------------------------------------------------------------------------------------------ No results for input(s):  "CHOL", "HDL", "LDLCALC", "TRIG", "CHOLHDL", "LDLDIRECT" in the last 72 hours.  Lab Results  Component Value Date   HGBA1C 6.0 (H) 02/14/2022    No results for input(s): "TSH", "T4TOTAL", "T3FREE", "THYROIDAB" in  the last 72 hours.  Invalid input(s): "FREET3" ------------------------------------------------------------------------------------------------------------------ ID Labs Recent Labs  Lab 06/02/22 1426 06/02/22 1643 06/02/22 2042 06/03/22 0155  WBC 18.3*  --   --  14.7*  PLT 268  --   --  225  LATICACIDVEN 1.8 2.5* 1.8  --   CREATININE 1.17  --   --  1.12    Radiology Reports IR Perc Cholecystostomy  Result Date: 06/03/2022 CLINICAL DATA:  Acute cholecystitis, sepsis and high surgical risk for immediate cholecystectomy. EXAM: PERCUTANEOUS CHOLECYSTOSTOMY TUBE PLACEMENT COMPARISON:  CT of the abdomen and pelvis on 06/02/2022 ANESTHESIA/SEDATION: Moderate (conscious) sedation was employed during this procedure. A total of Versed 1.0 mg and Fentanyl 50 mcg was administered intravenously by radiology nursing. Moderate Sedation Time: 10 minutes. The patient's level of consciousness and vital signs were monitored continuously by radiology nursing throughout the procedure under my direct supervision. CONTRAST:  10 mL Omnipaque 300 MEDICATIONS: No additional medications. FLUOROSCOPY TIME:  12 seconds.  1.1 mGy. PROCEDURE: The procedure, risks, benefits, and alternatives were explained to the patient's ex-wife as the patient was confused prior to the procedure. Questions regarding the procedure were encouraged and answered. The patient has ex-wife understands and consents to the procedure. The right abdominal wall was prepped with chlorhexidine in a sterile fashion, and a sterile drape was applied covering the operative field. A sterile gown and sterile gloves were used for the procedure. Local anesthesia was provided with 1% Lidocaine. Ultrasound image documentation was performed.  Fluoroscopy during the procedure and fluoro spot radiograph confirms appropriate catheter position. Ultrasound was utilized to localize the gallbladder. Under direct ultrasound guidance, an 18 gauge trocar needle was advanced into the gallbladder lumen. Aspiration was performed and a bile sample sent for culture studies. A small amount of diluted contrast material was injected. A guide wire was then advanced into the gallbladder. Percutaneous tract dilatation was then performed over a guide wire to 10-French. A 10-French pigtail drainage catheter was then advanced into the gallbladder lumen under fluoroscopy. Catheter was formed and injected with contrast material to confirm position. The catheter was flushed and connected to a gravity drainage bag. It was secured at the skin with a Prolene retention suture and Stat-Lock device. COMPLICATIONS: None FINDINGS: After needle puncture of the gallbladder, a dark bile sample was aspirated and sent for culture. The cholecystostomy tube was advanced into the gallbladder lumen and formed. It is now draining bile. This tube will be left to gravity drainage. IMPRESSION: Percutaneous cholecystostomy with placement of 10-French drainage catheter into the gallbladder lumen. This was left to gravity bag drainage. Electronically Signed   By: Irish Lack M.D.   On: 06/03/2022 09:21   CT Abdomen Pelvis W Contrast  Result Date: 06/02/2022 CLINICAL DATA:  Right lower quadrant abdominal pain. EXAM: CT ABDOMEN AND PELVIS WITH CONTRAST TECHNIQUE: Multidetector CT imaging of the abdomen and pelvis was performed using the standard protocol following bolus administration of intravenous contrast. RADIATION DOSE REDUCTION: This exam was performed according to the departmental dose-optimization program which includes automated exposure control, adjustment of the mA and/or kV according to patient size and/or use of iterative reconstruction technique. CONTRAST:  38mL OMNIPAQUE IOHEXOL 300  MG/ML  SOLN COMPARISON:  CT chest abdomen and pelvis 03/30/2022 FINDINGS: Lower chest: There is atelectasis in the lung bases. Hepatobiliary: Gallstones are again seen. There is marked diffuse gallbladder wall thickening. The gallbladder is dilated. There are only patchy areas of submucosal enhancement seen in the gallbladder wall. There is mild surrounding  inflammatory stranding. No biliary ductal dilatation. Liver is within normal limits. Pancreas: Unremarkable. No pancreatic ductal dilatation or surrounding inflammatory changes. Spleen: Normal in size without focal abnormality. Adrenals/Urinary Tract: There is a 2.7 cm cyst in the right kidney. Otherwise, the kidneys and adrenal glands are within normal limits. There are few punctate layering calculi in the bladder. Stomach/Bowel: Stomach is within normal limits. Appendix appears normal. No evidence of bowel wall thickening, distention, or inflammatory changes. Vascular/Lymphatic: Aortic atherosclerosis. No enlarged abdominal or pelvic lymph nodes. Reproductive: Prostate gland is enlarged, unchanged. Other: There is a large left inguinal hernia containing nondilated bowel similar to prior study. No ascites. Musculoskeletal: There is scoliosis of the thoracolumbar spine with degenerative change. IMPRESSION: 1. Findings compatible with acute cholecystitis. Gangrenous cholecystitis is a possibility given hypoenhancement of the gallbladder wall. This can be further evaluated with ultrasound. Surgical consultation recommended. 2. Large left inguinal hernia containing nondilated bowel. 3. 2.7 cm right Bosniak 1 benign simple cyst. No follow-up imaging is recommended. JACR 2018 Feb; 264-273, Management of the Incidental RenalMass on CT, [employer] 2021; 814-848, Bosniak Classification of Cystic Renal Masses, Version 2019. 4.  Aortic Atherosclerosis (ICD10-I70.0). Electronically Signed   By: Darliss Cheney M.D.   On: 06/02/2022 17:06

## 2022-06-03 NOTE — Progress Notes (Signed)
Referring Physician(s): Vonita Moss   Supervising Physician: Irish Lack  Patient Status:  [facility] - In-pt  Chief Complaint:  Calculus acute cholecystitis s/p perc chole by Dr. Fredia Sorrow on 8/12    Subjective:  Pt sleeping soundly, does not wake up when called by name or shaked gently.  NAD.   Allergies: Patient has no known allergies.  Medications: Prior to Admission medications   Medication Sig Start Date End Date Taking? Authorizing Provider  apixaban (ELIQUIS) 5 MG TABS tablet TAKE 1 TABLET (5 MG TOTAL) BY MOUTH 2 (TWO) TIMES DAILY. Patient taking differently: Take 5 mg by mouth 2 (two) times daily with a meal. 05/16/22 05/16/23  [name], MD  carbidopa-levodopa (SINEMET CR) 50-200 MG tablet TAKE 1 TABLET BY MOUTH AT BEDTIME. Patient taking differently: Take 1 tablet by mouth at bedtime. 10pm 05/16/22   [name], MD  carbidopa-levodopa (SINEMET IR) 25-100 MG tablet TAKE 1 & 1/2 TABLETS BY  MOUTH 5 TIMES A DAY, AT 8 AM, 11 AM, 2 PM, 5 PM AND 8 PM. Patient taking differently: Take 1.5 tablets by mouth See admin instructions. Take 1 1/2 tablet by mouth 5 times daily - 8am, 11am, 2pm, 5pm and 8pm 05/16/22 05/16/23  [name], MD  melatonin 5 MG TABS Take 5 mg by mouth at bedtime.    [provider]  propranolol ER (INDERAL LA) 60 MG 24 hr capsule TAKE 1 CAPSULE (60 MG TOTAL) BY MOUTH DAILY. Patient taking differently: Take 60 mg by mouth at bedtime. 05/16/22   [name], MD     Vital Signs: BP 91/72   Pulse 78   Temp 97.8 F (36.6 C) (Oral)   Resp 15   SpO2 98%   Physical Exam Vitals reviewed.  Constitutional:      General: He is not in acute distress.    Comments: Sleeping soundly  Cardiovascular:     Rate and Rhythm: Normal rate.  Pulmonary:     Effort: Pulmonary effort is normal.  Abdominal:     General: Abdomen is flat.     Palpations: Abdomen is soft.  Skin:    General: Skin is warm and dry.     Coloration:  Skin is not cyanotic or jaundiced.     Comments: Positive RUQ drain to a gravity bag. Site is unremarkable with no erythema, edema, tenderness, bleeding or drainage. Suture and stat lock in place. Dressing is clean, dry, and intact. Trace of  bark brown colored fluid noted in the bag. Drain aspirates and flushes well.       Imaging: IR Perc Cholecystostomy  Result Date: 06/03/2022 CLINICAL DATA:  Acute cholecystitis, sepsis and high surgical risk for immediate cholecystectomy. EXAM: PERCUTANEOUS CHOLECYSTOSTOMY TUBE PLACEMENT COMPARISON:  CT of the abdomen and pelvis on 06/02/2022 ANESTHESIA/SEDATION: Moderate (conscious) sedation was employed during this procedure. A total of Versed 1.0 mg and Fentanyl 50 mcg was administered intravenously by radiology nursing. Moderate Sedation Time: 10 minutes. The patient's level of consciousness and vital signs were monitored continuously by radiology nursing throughout the procedure under my direct supervision. CONTRAST:  10 mL Omnipaque 300 MEDICATIONS: No additional medications. FLUOROSCOPY TIME:  12 seconds.  1.1 mGy. PROCEDURE: The procedure, risks, benefits, and alternatives were explained to the patient's ex-wife as the patient was confused prior to the procedure. Questions regarding the procedure were encouraged and answered. The patient has ex-wife understands and consents to the procedure. The right abdominal wall was prepped with chlorhexidine in  a sterile fashion, and a sterile drape was applied covering the operative field. A sterile gown and sterile gloves were used for the procedure. Local anesthesia was provided with 1% Lidocaine. Ultrasound image documentation was performed. Fluoroscopy during the procedure and fluoro spot radiograph confirms appropriate catheter position. Ultrasound was utilized to localize the gallbladder. Under direct ultrasound guidance, an 18 gauge trocar needle was advanced into the gallbladder lumen. Aspiration was performed and  a bile sample sent for culture studies. A small amount of diluted contrast material was injected. A guide wire was then advanced into the gallbladder. Percutaneous tract dilatation was then performed over a guide wire to 10-French. A 10-French pigtail drainage catheter was then advanced into the gallbladder lumen under fluoroscopy. Catheter was formed and injected with contrast material to confirm position. The catheter was flushed and connected to a gravity drainage bag. It was secured at the skin with a Prolene retention suture and Stat-Lock device. COMPLICATIONS: None FINDINGS: After needle puncture of the gallbladder, a dark bile sample was aspirated and sent for culture. The cholecystostomy tube was advanced into the gallbladder lumen and formed. It is now draining bile. This tube will be left to gravity drainage. IMPRESSION: Percutaneous cholecystostomy with placement of 10-French drainage catheter into the gallbladder lumen. This was left to gravity bag drainage. Electronically Signed   By: Irish Lack M.D.   On: 06/03/2022 09:21   CT Abdomen Pelvis W Contrast  Result Date: 06/02/2022 CLINICAL DATA:  Right lower quadrant abdominal pain. EXAM: CT ABDOMEN AND PELVIS WITH CONTRAST TECHNIQUE: Multidetector CT imaging of the abdomen and pelvis was performed using the standard protocol following bolus administration of intravenous contrast. RADIATION DOSE REDUCTION: This exam was performed according to the departmental dose-optimization program which includes automated exposure control, adjustment of the mA and/or kV according to patient size and/or use of iterative reconstruction technique. CONTRAST:  50mL OMNIPAQUE IOHEXOL 300 MG/ML  SOLN COMPARISON:  CT chest abdomen and pelvis 03/30/2022 FINDINGS: Lower chest: There is atelectasis in the lung bases. Hepatobiliary: Gallstones are again seen. There is marked diffuse gallbladder wall thickening. The gallbladder is dilated. There are only patchy areas of  submucosal enhancement seen in the gallbladder wall. There is mild surrounding inflammatory stranding. No biliary ductal dilatation. Liver is within normal limits. Pancreas: Unremarkable. No pancreatic ductal dilatation or surrounding inflammatory changes. Spleen: Normal in size without focal abnormality. Adrenals/Urinary Tract: There is a 2.7 cm cyst in the right kidney. Otherwise, the kidneys and adrenal glands are within normal limits. There are few punctate layering calculi in the bladder. Stomach/Bowel: Stomach is within normal limits. Appendix appears normal. No evidence of bowel wall thickening, distention, or inflammatory changes. Vascular/Lymphatic: Aortic atherosclerosis. No enlarged abdominal or pelvic lymph nodes. Reproductive: Prostate gland is enlarged, unchanged. Other: There is a large left inguinal hernia containing nondilated bowel similar to prior study. No ascites. Musculoskeletal: There is scoliosis of the thoracolumbar spine with degenerative change. IMPRESSION: 1. Findings compatible with acute cholecystitis. Gangrenous cholecystitis is a possibility given hypoenhancement of the gallbladder wall. This can be further evaluated with ultrasound. Surgical consultation recommended. 2. Large left inguinal hernia containing nondilated bowel. 3. 2.7 cm right Bosniak 1 benign simple cyst. No follow-up imaging is recommended. JACR 2018 Feb; 264-273, Management of the Incidental RenalMass on CT, [employer] 2021; 814-848, Bosniak Classification of Cystic Renal Masses, Version 2019. 4.  Aortic Atherosclerosis (ICD10-I70.0). Electronically Signed   By: Darliss Cheney M.D.   On: 06/02/2022 17:06    Labs:  CBC:  Recent Labs    04/27/22 2045 05/02/22 1920 06/02/22 1426 06/03/22 0155  WBC 7.0 7.6 18.3* 14.7*  HGB 15.8 14.5 15.6 14.2  HCT 47.0 44.3 44.4 41.1  PLT 232 208 268 225    COAGS: Recent Labs    03/30/22 1425 06/02/22 1905  INR 1.4* 2.7*  APTT  --  48*    BMP: Recent Labs     04/27/22 2045 05/02/22 1920 06/02/22 1426 06/03/22 0155  NA 138 138 136 137  K 4.2 5.0 3.8 3.6  CL 107 106 104 105  CO2 22 20* 23 23  GLUCOSE 162* 90 170* 117*  BUN 23 25* 41* 37*  CALCIUM 9.1 8.8* 8.9 8.3*  CREATININE 1.33* 1.21 1.17 1.12  GFRNONAA 54* >60 >60 >60    LIVER FUNCTION TESTS: Recent Labs    03/31/22 0216 04/27/22 2045 06/02/22 1426 06/03/22 0155  BILITOT 2.7* 1.7* 2.3* 1.9*  AST 87* 20 32 21  ALT 13 21 12 6   ALKPHOS 95 104 100 87  PROT 5.0* 6.7 5.9* 5.1*  ALBUMIN 2.4* 3.4* 2.4* 2.0*    Assessment and Plan:  81 y.o. male with calculus  acute cholecystitis, s/p perc chole placement by Dr. 76 on 8/12.   WBC trending down 14.7 (18.3)  T bili trending down 1.9 (2.3) AST/ALT wnl  VSS Cx pending  Overnight OP 150 mL, trace dark brown colored fluid in the bad today.   Drain Location: RUQ Size: Fr size: 10 Fr Date of placement: 8/12  Currently to: Drain collection device: gravity 24 hour output:  Output by Drain (mL) 06/01/22 0701 - 06/01/22 1900 06/01/22 1901 - 06/02/22 0700 06/02/22 0701 - 06/02/22 1900 06/02/22 1901 - 06/03/22 0700 06/03/22 0701 - 06/03/22 1007  Closed System Drain 1 Lateral RUQ Other (Comment) 10.2 Fr.    150     Interval imaging/drain manipulation:  None   Current examination: Flushes/aspirates easily.  Insertion site unremarkable. Suture and stat lock in place. Dressed appropriately.   Plan: Continue TID flushes with 5 cc NS. Record output Q shift. Dressing changes QD or PRN if soiled.  Call IR APP or on call IR MD if difficulty flushing or sudden change in drain output.  Repeat imaging/possible drain injection once output < 10 mL/QD (excluding flush material). Consideration for drain removal if output is < 10 mL/QD (excluding flush material), pending discussion with the providing surgical service.  Discharge planning: Please contact IR APP or on call IR MD prior to patient d/c to ensure appropriate follow up plans  are in place. Patient will follow up with IR clinic 8 weeks post d/c for cholangiogram unless GB removed surgically interim . IR scheduler will contact patient with date/time of appointment. Patient will need to flush drain QD with 5 cc NS, record output QD, dressing changes every 2-3 days or earlier if soiled.   IR will continue to follow - please call with questions or concerns.   Further treatment plan per [facility]/ [facility] Appreciate and agree with the plan.  IR to follow.    Electronically Signed: 06/05/22, PA-C 06/03/2022, 10:03 AM   I spent a total of 15 Minutes at the the patient's bedside AND on the patient's hospital floor or unit, greater than 50% of which was counseling/coordinating care for perc chole drain.   This chart was dictated using voice recognition software.  Despite best efforts to proofread,  errors can occur which can change the documentation meaning.

## 2022-06-03 NOTE — Progress Notes (Signed)
ANTICOAGULATION CONSULT NOTE - Initial Consult  Pharmacy Consult for Heparin  Indication: atrial fibrillation  No Known Allergies  Patient Measurements:   Heparin Dosing Weight: 72.6 (TBW)  Vital Signs: Temp: 97.6 F (36.4 C) (08/13 2003) Temp Source: Axillary (08/13 2003) BP: 98/69 (08/13 2003) Pulse Rate: 70 (08/13 2003)  Labs: Recent Labs    06/02/22 1426 06/02/22 1905 06/03/22 0155 06/03/22 2128  HGB 15.6  --  14.2  --   HCT 44.4  --  41.1  --   PLT 268  --  225  --   APTT  --  48*  --  98*  LABPROT  --  28.2*  --   --   INR  --  2.7*  --   --   HEPARINUNFRC  --   --   --  >1.10*  CREATININE 1.17  --  1.12  --      Estimated Creatinine Clearance: 51.7 mL/min (by C-G formula based on SCr of 1.12 mg/dL).   Assessment: Sean Ward is a 81 y.o. male presenting to the hospital for treatment of gangrenous cholecystitis. PMH is significant for afib on Eliquis 5mg  BID PTA. Eliquis has been on hold for surgical cholecystostomy drain placement on 8/12. Last dose of eliquis 8/12 AM.  Pharmacy has been consulted for heparin dosing for afib in the post-op setting.   PTT therapeutic this PM  Goal of Therapy:  aPTT 66-102 seconds Monitor platelets by anticoagulation protocol: Yes   Plan:  Continue heparin at 1100 units / hr Follow up AM heparin level, PTT  Thank you 10/12, PharmD 06/03/2022 10:32 PM

## 2022-06-03 NOTE — Progress Notes (Addendum)
PT Cancellation Note  Patient Details Name: Sean Ward MRN: [id number] DOB: May 17, 1941   Cancelled Treatment:    Reason Eval/Treat Not Completed: Fatigue/lethargy limiting ability to participate;Other (comment) (Pt in and out of sleep. Will retry later as time allows.) UPDATE: pt declined stating he is not yet ready for physical therapy. Will see tomorrow.  Tana Coast, PT   Tana Coast 06/03/2022, 9:47 AM

## 2022-06-03 NOTE — Progress Notes (Signed)
   06/03/22 0936  Assess: MEWS Score  Temp 97.8 F (36.6 C)  BP 91/72  MAP (mmHg) 80  Pulse Rate 78  ECG Heart Rate 69  Resp 15  Level of Consciousness Responds to Voice  SpO2 98 %  Assess: MEWS Score  MEWS Temp 0  MEWS Systolic 1  MEWS Pulse 0  MEWS RR 0  MEWS LOC 1  MEWS Score 2  MEWS Score Color Yellow  Assess: if the MEWS score is Yellow or Red  Were vital signs taken at a resting state? Yes  Does the patient meet 2 or more of the SIRS criteria? No  MEWS guidelines implemented *See Row Information* Yes  Treat  MEWS Interventions Escalated (See documentation below)  Take Vital Signs  Increase Vital Sign Frequency  Yellow: Q 2hr X 2 then Q 4hr X 2, if remains yellow, continue Q 4hrs  Escalate  MEWS: Escalate Yellow: discuss with charge nurse/RN and consider discussing with provider and RRT  Notify: Charge Nurse/RN  Name of Charge Nurse/RN Notified Nadine, RN  Date Charge Nurse/RN Notified 06/03/22  Time Charge Nurse/RN Notified 0945  Notify: Provider  Provider Name/Title Dr. Thedore Mins  Date Provider Notified 06/03/22  Time Provider Notified 0945  Method of Notification  (secure chat)  Notification Reason Other (Comment) (yellow MEWS notification)  Provider response See new orders  Date of Provider Response 06/03/22  Time of Provider Response [phone]  Assess: SIRS CRITERIA  SIRS Temperature  0  SIRS Pulse 0  SIRS Respirations  0  SIRS WBC 1  SIRS Score Sum  1   Patient arouses to voice and follows commands. Alert to self, place and situation, confused to time. Dr. Thedore Mins ordered 1L LR bolus.

## 2022-06-03 NOTE — Progress Notes (Signed)
ANTICOAGULATION CONSULT NOTE - Initial Consult  Pharmacy Consult for Heparin  Indication: atrial fibrillation  No Known Allergies  Patient Measurements:   Heparin Dosing Weight: 72.6 (TBW)  Vital Signs: Temp: 97.9 F (36.6 C) (08/13 1129) Temp Source: Oral (08/13 0936) BP: 87/64 (08/13 1129) Pulse Rate: 78 (08/13 0936)  Labs: Recent Labs    06/02/22 1426 06/02/22 1905 06/03/22 0155  HGB 15.6  --  14.2  HCT 44.4  --  41.1  PLT 268  --  225  APTT  --  48*  --   LABPROT  --  28.2*  --   INR  --  2.7*  --   CREATININE 1.17  --  1.12    Estimated Creatinine Clearance: 51.7 mL/min (by C-G formula based on SCr of 1.12 mg/dL).   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 (Patient taking differently: Take 5 mg by mouth 2 (two) times daily with a meal.) 180 tablet 1    carbidopa-levodopa (SINEMET CR) 50-200 MG tablet TAKE 1 TABLET BY MOUTH AT BEDTIME. (Patient taking differently: Take 1 tablet by mouth at bedtime. 10pm) 90 tablet 1    carbidopa-levodopa (SINEMET IR) 25-100 MG tablet TAKE 1 & 1/2 TABLETS BY  MOUTH 5 TIMES A DAY, AT 8 AM, 11 AM, 2 PM, 5 PM AND 8 PM. (Patient taking differently: Take 1.5 tablets by mouth See admin instructions. Take 1 1/2 tablet by mouth 5 times daily - 8am, 11am, 2pm, 5pm and 8pm) 675 tablet 1    melatonin 5 MG TABS Take 5 mg by mouth at bedtime.      propranolol ER  (INDERAL LA) 60 MG 24 hr capsule TAKE 1 CAPSULE (60 MG TOTAL) BY MOUTH DAILY. (Patient taking differently: Take 60 mg by mouth at bedtime.) 90 capsule 1     Assessment: Sean Ward is a 81 y.o. male presenting to the hospital for treatment of gangrenous cholecystitis. PMH is significant for afib on Eliquis 5mg  BID PTA. Eliquis has been on hold for surgical cholecystostomy drain placement on 8/12. Last dose of eliquis 8/12 AM.  Pharmacy has been consulted for heparin dosing for afib in the post-op setting.   Goal of Therapy:  aPTT 66-102 seconds Monitor platelets by anticoagulation protocol: Yes   Plan:  No bolus due to recent procedure Initiate heparin 1100 units/hr Collect aPTT and heparin level in 8 hours then daily  Monitor for s/s of bleeding and clotting daily  10/12, PharmD PGY1 Pharmacy Resident 06/03/2022 11:49 AM

## 2022-06-03 NOTE — Progress Notes (Signed)
Subjective/Chief Complaint: Tolerated perc chole tube placement by IR   Objective: Vital signs in last 24 hours: Temp:  [97.6 F (36.4 C)-98.9 F (37.2 C)] 97.6 F (36.4 C) (08/13 0811) Pulse Rate:  [68-88] 74 (08/13 0811) Resp:  [11-29] 15 (08/13 0811) BP: (90-171)/(61-108) 96/64 (08/13 0811) SpO2:  [93 %-100 %] 97 % (08/13 0811)    Intake/Output from previous day: 08/12 0701 - 08/13 0700 In: 1030 [P.O.:30; IV Piggyback:1000] Out: 150 [Drains:150] Intake/Output this shift: No intake/output data recorded.  Exam: Abdomen soft, minimally tender Drain serosang  Lab Results:  Recent Labs    06/02/22 1426 06/03/22 0155  WBC 18.3* 14.7*  HGB 15.6 14.2  HCT 44.4 41.1  PLT 268 225   BMET Recent Labs    06/02/22 1426 06/03/22 0155  NA 136 137  K 3.8 3.6  CL 104 105  CO2 23 23  GLUCOSE 170* 117*  BUN 41* 37*  CREATININE 1.17 1.12  CALCIUM 8.9 8.3*   PT/INR Recent Labs    06/02/22 1905  LABPROT 28.2*  INR 2.7*   ABG No results for input(s): "PHART", "HCO3" in the last 72 hours.  Invalid input(s): "PCO2", "PO2"  Studies/Results: CT Abdomen Pelvis W Contrast  Result Date: 06/02/2022 CLINICAL DATA:  Right lower quadrant abdominal pain. EXAM: CT ABDOMEN AND PELVIS WITH CONTRAST TECHNIQUE: Multidetector CT imaging of the abdomen and pelvis was performed using the standard protocol following bolus administration of intravenous contrast. RADIATION DOSE REDUCTION: This exam was performed according to the departmental dose-optimization program which includes automated exposure control, adjustment of the mA and/or kV according to patient size and/or use of iterative reconstruction technique. CONTRAST:  61mL OMNIPAQUE IOHEXOL 300 MG/ML  SOLN COMPARISON:  CT chest abdomen and pelvis 03/30/2022 FINDINGS: Lower chest: There is atelectasis in the lung bases. Hepatobiliary: Gallstones are again seen. There is marked diffuse gallbladder wall thickening. The gallbladder is  dilated. There are only patchy areas of submucosal enhancement seen in the gallbladder wall. There is mild surrounding inflammatory stranding. No biliary ductal dilatation. Liver is within normal limits. Pancreas: Unremarkable. No pancreatic ductal dilatation or surrounding inflammatory changes. Spleen: Normal in size without focal abnormality. Adrenals/Urinary Tract: There is a 2.7 cm cyst in the right kidney. Otherwise, the kidneys and adrenal glands are within normal limits. There are few punctate layering calculi in the bladder. Stomach/Bowel: Stomach is within normal limits. Appendix appears normal. No evidence of bowel wall thickening, distention, or inflammatory changes. Vascular/Lymphatic: Aortic atherosclerosis. No enlarged abdominal or pelvic lymph nodes. Reproductive: Prostate gland is enlarged, unchanged. Other: There is a large left inguinal hernia containing nondilated bowel similar to prior study. No ascites. Musculoskeletal: There is scoliosis of the thoracolumbar spine with degenerative change. IMPRESSION: 1. Findings compatible with acute cholecystitis. Gangrenous cholecystitis is a possibility given hypoenhancement of the gallbladder wall. This can be further evaluated with ultrasound. Surgical consultation recommended. 2. Large left inguinal hernia containing nondilated bowel. 3. 2.7 cm right Bosniak 1 benign simple cyst. No follow-up imaging is recommended. JACR 2018 Feb; 264-273, Management of the Incidental RenalMass on CT, [employer] 2021; 814-848, Bosniak Classification of Cystic Renal Masses, Version 2019. 4.  Aortic Atherosclerosis (ICD10-I70.0). Electronically Signed   By: Darliss Cheney M.D.   On: 06/02/2022 17:06    Anti-infectives: Anti-infectives (From admission, onward)    Start     Dose/Rate Route Frequency Ordered Stop   06/03/22 0200  piperacillin-tazobactam (ZOSYN) IVPB 3.375 g        3.375 g 12.5  mL/hr over 240 Minutes Intravenous Every 8 hours 06/02/22 2132      06/02/22 1930  piperacillin-tazobactam (ZOSYN) IVPB 3.375 g        3.375 g 100 mL/hr over 30 Minutes Intravenous  Once 06/02/22 1927 06/02/22 2008   06/02/22 1930  vancomycin (VANCOREADY) IVPB 1500 mg/300 mL        1,500 mg 150 mL/hr over 120 Minutes Intravenous  Once 06/02/22 1930 06/02/22 2145   06/02/22 1730  piperacillin-tazobactam (ZOSYN) IVPB 4.5 g  Status:  Discontinued        4.5 g 200 mL/hr over 30 Minutes Intravenous STAT 06/02/22 1722 06/02/22 1929   06/02/22 1730  vancomycin (VANCOCIN) IVPB 1000 mg/200 mL premix  Status:  Discontinued        1,000 mg 200 mL/hr over 60 Minutes Intravenous STAT 06/02/22 1722 06/02/22 1930       Assessment/Plan: 81 year old male with acute cholecystitis. History of Parkinson's History of A-fib on Eliquis Dyslipidemia  WBC and LFT's improved after percutaneous cholecystostomy tube placement by IR  OK from surgery to advance diet as tolerated  [name] MD 06/03/2022

## 2022-06-04 DIAGNOSIS — K810 Acute cholecystitis: Secondary | ICD-10-CM | POA: Diagnosis not present

## 2022-06-04 LAB — COMPREHENSIVE METABOLIC PANEL
ALT: 5 U/L (ref 0–44)
AST: 14 U/L — ABNORMAL LOW (ref 15–41)
Albumin: 1.7 g/dL — ABNORMAL LOW (ref 3.5–5.0)
Alkaline Phosphatase: 62 U/L (ref 38–126)
Anion gap: 6 (ref 5–15)
BUN: 26 mg/dL — ABNORMAL HIGH (ref 8–23)
CO2: 21 mmol/L — ABNORMAL LOW (ref 22–32)
Calcium: 7.8 mg/dL — ABNORMAL LOW (ref 8.9–10.3)
Chloride: 112 mmol/L — ABNORMAL HIGH (ref 98–111)
Creatinine, Ser: 0.96 mg/dL (ref 0.61–1.24)
GFR, Estimated: >60 mL/min (ref >60)
Glucose, Bld: 128 mg/dL — ABNORMAL HIGH (ref 70–99)
Potassium: 3.8 mmol/L (ref 3.5–5.1)
Sodium: 139 mmol/L (ref 135–145)
Total Bilirubin: 1.6 mg/dL — ABNORMAL HIGH (ref 0.3–1.2)
Total Protein: 4.5 g/dL — ABNORMAL LOW (ref 6.5–8.1)

## 2022-06-04 LAB — CBC WITH DIFFERENTIAL/PLATELET
Abs Immature Granulocytes: 0.05 10*3/uL (ref 0.00–0.07)
Basophils Absolute: 0 10*3/uL (ref 0.0–0.1)
Basophils Relative: 0 %
Eosinophils Absolute: 0.1 10*3/uL (ref 0.0–0.5)
Eosinophils Relative: 1 %
HCT: 33 % — ABNORMAL LOW (ref 39.0–52.0)
Hemoglobin: 11.3 g/dL — ABNORMAL LOW (ref 13.0–17.0)
Immature Granulocytes: 1 %
Lymphocytes Relative: 16 %
Lymphs Abs: 0.9 10*3/uL (ref 0.7–4.0)
MCH: 31.5 pg (ref 26.0–34.0)
MCHC: 34.2 g/dL (ref 30.0–36.0)
MCV: 91.9 fL (ref 80.0–100.0)
Monocytes Absolute: 0.7 10*3/uL (ref 0.1–1.0)
Monocytes Relative: 12 %
Neutro Abs: 4.2 10*3/uL (ref 1.7–7.7)
Neutrophils Relative %: 70 %
Platelets: 202 10*3/uL (ref 150–400)
RBC: 3.59 MIL/uL — ABNORMAL LOW (ref 4.22–5.81)
RDW: 15.4 % (ref 11.5–15.5)
WBC: 6 10*3/uL (ref 4.0–10.5)
nRBC: 0 % (ref 0.0–0.2)

## 2022-06-04 LAB — BRAIN NATRIURETIC PEPTIDE: B Natriuretic Peptide: 333.8 pg/mL — ABNORMAL HIGH (ref 0.0–100.0)

## 2022-06-04 LAB — MAGNESIUM: Magnesium: 1.8 mg/dL (ref 1.7–2.4)

## 2022-06-04 LAB — HEPARIN LEVEL (UNFRACTIONATED): Heparin Unfractionated: >1.1 IU/mL — ABNORMAL HIGH (ref 0.30–0.70)

## 2022-06-04 LAB — APTT: aPTT: 93 seconds — ABNORMAL HIGH (ref 24–36)

## 2022-06-04 MED ORDER — MELATONIN 3 MG PO TABS
3.0000 mg | ORAL_TABLET | Freq: Once | ORAL | Status: AC
  Administered 2022-06-05: 3 mg via ORAL
  Filled 2022-06-04: qty 1

## 2022-06-04 MED ORDER — LACTATED RINGERS IV SOLN: INTRAVENOUS | Status: AC

## 2022-06-04 MED ORDER — LACTATED RINGERS IV BOLUS
1000.0000 mL | Freq: Once | INTRAVENOUS | Status: AC
  Administered 2022-06-04: 1000 mL via INTRAVENOUS

## 2022-06-04 MED ORDER — KCL IN DEXTROSE-NACL 20-5-0.9 MEQ/L-%-% IV SOLN
INTRAVENOUS | Status: DC
  Filled 2022-06-04: qty 1000

## 2022-06-04 MED ORDER — MAGNESIUM SULFATE IN D5W 1-5 GM/100ML-% IV SOLN
1.0000 g | Freq: Once | INTRAVENOUS | Status: AC
  Administered 2022-06-04: 1 g via INTRAVENOUS
  Filled 2022-06-04: qty 100

## 2022-06-04 NOTE — Evaluation (Signed)
Physical Therapy Evaluation Patient Details Name: Sean Ward MRN: [id number] DOB: [birth year] Today's Date: 06/04/2022  History of Present Illness  81 yo male admitted 06/02/22 with abdominal pain x1 week, N/V. Found to have sepsis due to acute gangrenous cholecystitis s/p cholecystostomy drain 06/02/22. PMH: AFib, Parkinson's disease, scoliosis  Clinical Impression   Patient is s/p cholecystostomy surgery resulting in functional limitations due to the deficits listed below (see PT problem list). Pt currently requesting to eat lunch in bed so positioned in chair position in the bed. Currently needs Max assist for rolling, and sliding up in bed. Pt able to pull from supported sitting to unsupported sitting in bed (bed in  chair position) by pulling on bilateral bedrails. Pt expressed needing to talk to MD Thedore Mins regarding POC. Patient will benefit from skilled PT acutely to increase independence and safety with mobility and ADLs to allow discharge SNF.       Recommendations for follow up therapy are one component of a multi-disciplinary discharge planning process, led by the attending physician.  Recommendations may be updated based on patient status, additional functional criteria and insurance authorization.  Follow Up Recommendations Skilled nursing-short term rehab (<3 hours/day) Can patient physically be transported by private vehicle: No    Assistance Recommended at Discharge Frequent or constant Supervision/Assistance  Patient can return home with the following  A lot of help with bathing/dressing/bathroom;A lot of help with walking and/or transfers;Assist for transportation;Assistance with cooking/housework;Help with stairs or ramp for entrance    Equipment Recommendations None recommended by PT  Recommendations for Other Services       Functional Status Assessment Patient has had a recent decline in their functional status and demonstrates the ability to make significant  improvements in function in a reasonable and predictable amount of time.     Precautions / Restrictions Precautions Precautions: Fall Precaution Comments: drain R side, [place] 3L      Mobility  Bed Mobility Overal bed mobility: Needs Assistance Bed Mobility: Rolling Rolling: Max assist         General bed mobility comments: pt max (A) to roll R and L with therapist placing pad in correct position. pt scooted up in bed total+2 max (A) and bed placed in chair position. pt requires pillows to help with alignment of trunk in the bed for self feeding. pillow placed under R elbow to help with arm positioning. pt was able to use bil UE in long sitting to pull trunk forward to have pillow placed along spine for upright posture to eat    Transfers                   General transfer comment: declined but states 'tomorrow would be better"    Ambulation/Gait                  Stairs            Wheelchair Mobility    Modified Rankin (Stroke Patients Only)       Balance     Sitting balance-Leahy Scale: Poor                                       Pertinent Vitals/Pain Pain Assessment Pain Assessment: No/denies pain    Home Living Family/patient expects to be discharged to:: Skilled nursing facility Living Arrangements: Alone  Additional Comments: Pt living in motel prior to last hospital admission before discharging to SNF.    Prior Function Prior Level of Function : Independent/Modified Independent;Driving             Mobility Comments: unable to ambulate or transfer per pt ADLs Comments: does IADLs     Hand Dominance   Dominant Hand: Right    Extremity/Trunk Assessment   Upper Extremity Assessment Upper Extremity Assessment: Defer to OT evaluation    Lower Extremity Assessment Lower Extremity Assessment: Generalized weakness (Inferred; Pt with limited participation today; noted limited strength and ROM  from PT eval on 8/1) RLE Sensation: history of peripheral neuropathy RLE Coordination: decreased gross motor;decreased fine motor LLE Sensation: history of peripheral neuropathy LLE Coordination: decreased gross motor;decreased fine motor    Cervical / Trunk Assessment Cervical / Trunk Assessment: Kyphotic  Communication   Communication: No difficulties (speaks itailian, french spanish and english)  Cognition Arousal/Alertness: Awake/alert Behavior During Therapy: WFL for tasks assessed/performed Overall Cognitive Status: No family/caregiver present to determine baseline cognitive functioning                                 General Comments: oriented to day of the week, pt expressing concerns to stpeak with Dr Thedore Mins regarding weekend        General Comments General comments (skin integrity, edema, etc.): VSS on supplemental O2    Exercises     Assessment/Plan    PT Assessment Patient needs continued PT services  PT Problem List Decreased strength;Decreased mobility;Decreased activity tolerance;Decreased balance;Decreased knowledge of use of DME;Decreased range of motion       PT Treatment Interventions DME instruction;Therapeutic exercise;Gait training;Functional mobility training;Therapeutic activities;Patient/family education;Balance training    PT Goals (Current goals can be found in the Care Plan section)  Acute Rehab PT Goals Patient Stated Goal: to get back to living on his own PT Goal Formulation: With patient Time For Goal Achievement: 06/18/22 Potential to Achieve Goals: Fair    Frequency Min 2X/week     Co-evaluation PT/OT/SLP Co-Evaluation/Treatment: Yes Reason for Co-Treatment: Necessary to address cognition/behavior during functional activity;For patient/therapist safety (pt showing limited activity tolerance) PT goals addressed during session: Mobility/safety with mobility         AM-PAC PT "6 Clicks" Mobility  Outcome Measure Help  needed turning from your back to your side while in a flat bed without using bedrails?: A Lot Help needed moving from lying on your back to sitting on the side of a flat bed without using bedrails?: A Lot Help needed moving to and from a bed to a chair (including a wheelchair)?: Total Help needed standing up from a chair using your arms (e.g., wheelchair or bedside chair)?: Total Help needed to walk in hospital room?: Total Help needed climbing 3-5 steps with a railing? : Total 6 Click Score: 8    End of Session Equipment Utilized During Treatment: Other (comment) (bed pads) Activity Tolerance: Other (comment) (Pt focused on eating lunch) Patient left: in bed;with call bell/phone within reach;with bed alarm set (bed in chair position) Nurse Communication: Mobility status PT Visit Diagnosis: Other abnormalities of gait and mobility (R26.89);Muscle weakness (generalized) (M62.81)    Time: 3149-7026 PT Time Calculation (min) (ACUTE ONLY): 21 min   Charges:   PT Evaluation $PT Eval Low Complexity: 1 Low          Van Clines, PT  Acute Rehabilitation Services Office [phone]  Levi Aland 06/04/2022, 4:58 PM

## 2022-06-04 NOTE — Plan of Care (Deleted)
                                      [facility]                            [address] [place], [place] [address]      Arshdeep Bolger was admitted to the Hospital on 06/02/2022 and Discharged  06/04/2022 and should be excused from work/school   for 7  days starting from date -  06/02/2022 , may return to work/school without any restrictions.  Call Susa Raring MD, [facility]  [phone] with questions.  Susa Raring M.D on 06/04/2022,at 7:39 AM  [facility]   Office  [phone]

## 2022-06-04 NOTE — Progress Notes (Signed)
ANTICOAGULATION CONSULT NOTE - Initial Consult  Pharmacy Consult for Heparin  Indication: atrial fibrillation  No Known Allergies  Patient Measurements:   Heparin Dosing Weight: 72.6 (TBW)  Vital Signs: BP: 114/70 (08/14 0609) Pulse Rate: 58 (08/14 0609)  Labs: Recent Labs    06/02/22 1426 06/02/22 1905 06/03/22 0155 06/03/22 2128 06/04/22 0303  HGB 15.6  --  14.2  --  11.3*  HCT 44.4  --  41.1  --  33.0*  PLT 268  --  225  --  202  APTT  --  48*  --  98* 93*  LABPROT  --  28.2*  --   --   --   INR  --  2.7*  --   --   --   HEPARINUNFRC  --   --   --  >1.10* >1.10*  CREATININE 1.17  --  1.12  --  0.96     Estimated Creatinine Clearance: 60.4 mL/min (by C-G formula based on SCr of 0.96 mg/dL).   Assessment: Sean Ward is a 81 y.o. male presenting to the hospital for treatment of gangrenous cholecystitis. PMH is significant for afib on Eliquis 5mg  BID PTA. Eliquis has been on hold for surgical cholecystostomy drain placement on 8/12. Last dose of eliquis 8/12 AM.  Pharmacy has been consulted for heparin dosing for afib in the post-op setting.   aPTT: 93, therapeutic Heparin level: >1.1, supratherapeutic in the setting of recent apixaban administration Current heparin infusion rate: 1100 units/hr No s/sx of bleeding noted by provider  Goal of Therapy:  aPTT 66-102 seconds Monitor platelets by anticoagulation protocol: Yes   Plan:  Continue heparin infusion at 1100 units/hr Monitor daily CBC, aPTT, heparin level, and for s/sx of bleeding.  F/u on plan to transition back to apixaban  10/12, PharmD, BCPS Clinical Pharmacist 06/04/2022 8:37 AM   Please refer to [facility] for pharmacy phone number

## 2022-06-04 NOTE — Plan of Care (Addendum)
A&Ox4, waxes/wanes. Education on heparin & blood lab draws given; pt refuses further blood draws. Expresses emotional and physical concerns over BP and bolus need. Therapeutic presence and active listening provided. Phlebotomy & IV team also at the bedside this shift to discuss with patient. APP on call updated. Charge RN aware.  Report given to AM shift & pt's declination of additional PIV conveyed. Pt gave verbal understanding and teach back about compatibility concerns, etc. Pt also expressed understanding of need for blood labs for monitoring.   Problem: Education: Goal: Knowledge of General Education information will improve Description: Including pain rating scale, medication(s)/side effects and non-pharmacologic comfort measures Outcome: Progressing   Problem: Health Behavior/Discharge Planning: Goal: Ability to manage health-related needs will improve Outcome: Progressing   Problem: Clinical Measurements: Goal: Ability to maintain clinical measurements within normal limits will improve Outcome: Progressing Goal: Will remain free from infection Outcome: Progressing Goal: Diagnostic test results will improve Outcome: Progressing Goal: Respiratory complications will improve Outcome: Progressing Goal: Cardiovascular complication will be avoided Outcome: Progressing   Problem: Activity: Goal: Risk for activity intolerance will decrease Outcome: Progressing   Problem: Nutrition: Goal: Adequate nutrition will be maintained Outcome: Progressing   Problem: Coping: Goal: Level of anxiety will decrease Outcome: Progressing   Problem: Elimination: Goal: Will not experience complications related to bowel motility Outcome: Progressing Goal: Will not experience complications related to urinary retention Outcome: Progressing   Problem: Pain Managment: Goal: General experience of comfort will improve Outcome: Progressing   Problem: Safety: Goal: Ability to remain free from  injury will improve Outcome: Progressing   Problem: Skin Integrity: Goal: Risk for impaired skin integrity will decrease Outcome: Progressing

## 2022-06-04 NOTE — Evaluation (Signed)
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 PMH: AFib, Parkinson's disease, scoliosis. Found to have sepsis due to acute gangrenous cholecystitis s/p cholecystostomy drain 06/02/22.    Assessment / Plan / Recommendation  Clinical Impression  Pt seen at bedside for assessment of swallow function and safety. Pt was seated upright in bed, awake and alert. No family present. Pt has adequate natural dentition, CN exam unremarkable. Pt tolerated trials of thin liquid, puree, and solid textures without overt s/s aspiration or reported difficulty. Pt reports he does not eat red meat or pork. Will begin regular diet with thin liquids and request omission of these items. No further ST intervention recommended at this time. Please reconsult if needs arise.  SLP Visit Diagnosis: Dysphagia, unspecified (R13.10)    Aspiration Risk  Mild aspiration risk    Diet Recommendation Regular;Thin liquid;Other (Comment) (pt does not eat red meat or pork)   Medication Administration: Other (Comment) (as tolerated) Supervision: Patient  able to self feed Compensations: Slow rate;Small sips/bites;Minimize environmental distractions Postural Changes: Seated upright at 90 degrees    Other  Recommendations Oral Care Recommendations: Oral care BID    Recommendations for follow up therapy are one component of a multi-disciplinary discharge planning process, led by the attending physician.  Recommendations may be updated based on patient status, additional functional criteria and insurance authorization.  Assistance Recommended at Discharge    Functional Status Assessment Patient has had a recent decline in their functional status and demonstrates the ability to make significant improvements in function in a reasonable and predictable amount of time.      Prognosis Prognosis for Safe Diet Advancement: Good      Swallow Study   General Date of Onset: 06/02/22 HPI: 81yo male admitted 06/02/22 with abdominal pain x1 week, N/V. PMH: AFib, Parkinson's disease, scoliosis. Found to have sepsis due to acute gangrenous cholecystitis s/p cholecystostomy drain 06/02/22. Type of Study: Bedside Swallow Evaluation Previous Swallow Assessment: none Diet Prior to this Study: Other (Comment) (clear liquid) Temperature Spikes Noted: No Respiratory Status: Nasal cannula History of Recent Intubation: No Behavior/Cognition: Alert;Cooperative;Pleasant mood Oral Cavity Assessment: Within Functional Limits Oral Care Completed by SLP: No Oral Cavity - Dentition: Adequate natural dentition Vision: Functional for self-feeding Self-Feeding Abilities: Able to feed self Patient Positioning: Upright in bed Baseline Vocal Quality: Normal Volitional Cough: Strong Volitional Swallow: Able to elicit    Oral/Motor/Sensory Function Overall Oral Motor/Sensory Function: Within functional limits   Ice Chips Ice chips: Not tested   Thin Liquid Thin Liquid: Within functional limits Presentation: Straw  Nectar Thick Nectar Thick Liquid: Not tested   Honey  Thick Honey Thick Liquid: Not tested   Puree Puree: Within functional limits Presentation: Self Fed;Spoon   Solid     Solid: Within functional limits Presentation: Self Fed     Sean Ward B. Murvin Natal, [facility], CCC-SLP Speech Language Pathologist Office: [phone]  Leigh Aurora 06/04/2022,10:50 AM

## 2022-06-04 NOTE — Progress Notes (Signed)
PROGRESS NOTE                                                                                                                                                                                                             Patient Demographics:    Sean Ward, is a 81 y.o. male, DOB - 1941/10/18, QIW:[id number]  Outpatient Primary MD for the patient is [name], MD    LOS - 2  Admit date - 06/02/2022    Chief Complaint  Patient presents with   Abdominal Pain       Brief Narrative (HPI from H&P)   81 y.o. male with history of A-fib, Parkinson's disease has been experiencing abdominal pain for the last 1 week with associated nausea and vomiting, in the ER he was diagnosed with acute cholecystitis with sepsis, he was seen by general surgery who recommended that patient be referred for cholecystostomy drain placement, he was admitted by hospitalist team for further care.   Subjective:    Patient in bed, appears comfortable, denies any headache, no fever, no chest pain or pressure, no shortness of breath , no abdominal pain. No new focal weakness.   Assessment  & Plan :    Sepsis secondary to acute gangrenous cholecystitis s/p cholecystostomy drain placed by IR on 06/02/2022. He been seen by IR and general surgery, he has undergone cholecystostomy drain placement, sepsis pathophysiology is resolving, continue empiric IV antibiotics, aggressive IV fluid bolus and maintenance, monitor cultures, MRSA nasal PCR negative.  Drain care per IR - flush drain QD with 5 cc NS, record output QD, dressing changes every 2-3 days or earlier if soiled.   History of paroxysmal A-fib presently heart rate is in sinus rhythm and around 60 bpm.  [place] vasc 2 score of greater than 3.  Eliquis on hold, will place him on heparin drip now on bolus for the next 1 to 2 days thereafter resume Eliquis.  Will add as needed IV Lopressor.  History of  Parkinson's disease we will continue Sinemet.  At risk for delirium, minimize narcotics and benzodiazepines.  ? Mild dysphagia - SLP eval      Condition - Extremely Guarded  Family Communication  :    Called wife Dois Davenport [phone] on 06/03/2022 message left at 7:50 AM, 06/04/2022 at 8:14 AM  Code Status :  Full  Consults  :  CCS, IR  PUD Prophylaxis : PPI   Procedures  :     CT - 1. Findings compatible with acute cholecystitis. Gangrenous cholecystitis is a possibility given hypoenhancement of the gallbladder wall. This can be further evaluated with ultrasound. Surgical consultation recommended. 2. Large left inguinal hernia containing nondilated bowel. 3. 2.7 cm right Bosniak 1 benign simple cyst. No follow-up imaging is recommended.  Cholecystostomy drain placed by IR on 06/02/2022.      Disposition Plan  :    Status is: Inpatient  DVT Prophylaxis  :  Heparin added  Place TED hose Start: 06/03/22 1738 SCDs Start: 06/02/22 2130    Lab Results  Component Value Date   PLT 202 06/04/2022    Diet :  Diet Order             Diet clear liquid Room service appropriate? Yes; Fluid consistency: Thin  Diet effective now                    Inpatient Medications  Scheduled Meds:  carbidopa-levodopa  1 tablet Oral QHS   carbidopa-levodopa  1.5 tablet Oral 5 X Daily   midodrine  10 mg Oral TID WC   pantoprazole  40 mg Oral Daily   sodium chloride flush  5 mL Intracatheter Q8H   Continuous Infusions:  heparin 1,100 Units/hr (06/03/22 1800)   lactated ringers     magnesium sulfate bolus IVPB     piperacillin-tazobactam (ZOSYN)  IV 3.375 g (06/04/22 0220)   PRN Meds:.  Time Spent in minutes  30   Susa Raring M.D on 06/04/2022 at 8:13 AM  To page go to [email or web address]   [facility] -  Office  [phone]  See all Orders from today for further details    Objective:   Vitals:   06/04/22 0314 06/04/22 0412 06/04/22 0509 06/04/22 0609  BP: 96/70  97/72 104/64 114/70  Pulse: 64 63 64 (!) 58  Resp: 15 15 15 12   Temp:      TempSrc:      SpO2: 98% 98% 99% 99%    Wt Readings from Last 3 Encounters:  05/21/22 72.6 kg  05/02/22 74.2 kg  04/17/22 74.2 kg     Intake/Output Summary (Last 24 hours) at 06/04/2022 0813 Last data filed at 06/04/2022 06/06/2022 Gross per 24 hour  Intake 2899.42 ml  Output 620 ml  Net 2279.42 ml     Physical Exam  Awake, minimally confused,, No new F.N deficits, right upper quadrant gallbladder drain in place, 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 (Active)  03/30/22 2100  Location: Hip  Location Orientation: Posterior;Proximal;Right  Staging: Stage 2 -  Partial thickness loss of dermis presenting as a shallow open injury with a red, pink wound bed without slough.  Wound Description (Comments):   Present on Admission: Yes     Pressure Injury 03/30/22 Lumbar Lateral;Right Stage 1 -  Intact skin with non-blanchable redness of a localized area usually over a bony prominence. (Active)  03/30/22 2100  Location: Lumbar  Location Orientation: Lateral;Right  Staging: Stage 1 -  Intact skin with non-blanchable redness of a localized area usually over a bony prominence.  Wound Description (Comments):   Present on Admission: Yes     Pressure Injury 03/31/22 Lumbar Right Stage 2 -  Partial thickness loss of dermis presenting as a shallow open injury with a red, pink wound bed without slough. (Active)  03/31/22   Location: Lumbar  Location Orientation: Right  Staging: Stage 2 -  Partial thickness loss  of dermis presenting as a shallow open injury with a red, pink wound bed without slough.  Wound Description (Comments):   Present on Admission:      Data Review:    CBC Recent Labs  Lab 06/02/22 1426 06/03/22 0155 06/04/22 0303  WBC 18.3* 14.7* 6.0  HGB 15.6 14.2 11.3*  HCT 44.4 41.1 33.0*  PLT 268 225 202  MCV 91.4 92.4 91.9  MCH 32.1 31.9 31.5  MCHC 35.1 34.5 34.2  RDW 15.5 15.5 15.4  LYMPHSABS  --  0.6* 0.9  MONOABS  --  1.3* 0.7  EOSABS  --  0.0 0.1  BASOSABS  --  0.0 0.0    Electrolytes Recent Labs  Lab 06/02/22 1426 06/02/22 1643 06/02/22 1905 06/02/22 2042 06/03/22 0155 06/04/22 0303  NA 136  --   --   --  137 139  K 3.8  --   --   --  3.6 3.8  CL 104  --   --   --  105 112*  CO2 23  --   --   --  23 21*  GLUCOSE 170*  --   --   --  117* 128*  BUN 41*  --   --   --  37* 26*  CREATININE 1.17  --   --   --  1.12 0.96  CALCIUM 8.9  --   --   --  8.3* 7.8*  AST 32  --   --   --  21 14*  ALT 12  --   --   --  6 5  ALKPHOS 100  --   --   --  87 62  BILITOT 2.3*  --   --   --  1.9* 1.6*  ALBUMIN 2.4*  --   --   --  2.0* 1.7*  MG  --   --   --   --   --  1.8  LATICACIDVEN 1.8 2.5*  --  1.8  --   --   INR  --   --  2.7*  --   --   --   BNP  --   --   --   --   --  333.8*    ------------------------------------------------------------------------------------------------------------------ No results for input(s): "CHOL", "HDL", "LDLCALC", "TRIG", "CHOLHDL", "LDLDIRECT" in the last 72 hours.  Lab Results  Component Value Date   HGBA1C 6.0 (H) 02/14/2022    No results for input(s): "TSH", "T4TOTAL", "T3FREE", "THYROIDAB" in the last 72 hours.  Invalid input(s): "FREET3" ------------------------------------------------------------------------------------------------------------------ ID Labs Recent Labs  Lab 06/02/22 1426 06/02/22 1643 06/02/22 2042 06/03/22 0155 06/04/22 0303  WBC 18.3*  --   --  14.7* 6.0  PLT 268  --   --  225 202   LATICACIDVEN 1.8 2.5* 1.8  --   --   CREATININE 1.17  --   --  1.12 0.96    Radiology Reports IR Perc Cholecystostomy  Result Date: 06/03/2022 CLINICAL DATA:  Acute cholecystitis, sepsis and high  surgical risk for immediate cholecystectomy. EXAM: PERCUTANEOUS CHOLECYSTOSTOMY TUBE PLACEMENT COMPARISON:  CT of the abdomen and pelvis on 06/02/2022 ANESTHESIA/SEDATION: Moderate (conscious) sedation was employed during this procedure. A total of Versed 1.0 mg and Fentanyl 50 mcg was administered intravenously by radiology nursing. Moderate Sedation Time: 10 minutes. The patient's level of consciousness and vital signs were monitored continuously by radiology nursing throughout the procedure under my direct supervision. CONTRAST:  10 mL Omnipaque 300 MEDICATIONS: No additional medications. FLUOROSCOPY TIME:  12 seconds.  1.1 mGy. PROCEDURE: The procedure, risks, benefits, and alternatives were explained to the patient's ex-wife as the patient was confused prior to the procedure. Questions regarding the procedure were encouraged and answered. The patient has ex-wife understands and consents to the procedure. The right abdominal wall was prepped with chlorhexidine in a sterile fashion, and a sterile drape was applied covering the operative field. A sterile gown and sterile gloves were used for the procedure. Local anesthesia was provided with 1% Lidocaine. Ultrasound image documentation was performed. Fluoroscopy during the procedure and fluoro spot radiograph confirms appropriate catheter position. Ultrasound was utilized to localize the gallbladder. Under direct ultrasound guidance, an 18 gauge trocar needle was advanced into the gallbladder lumen. Aspiration was performed and a bile sample sent for culture studies. A small amount of diluted contrast material was injected. A guide wire was then advanced into the gallbladder. Percutaneous tract dilatation was then performed over a guide wire to 10-French. A  10-French pigtail drainage catheter was then advanced into the gallbladder lumen under fluoroscopy. Catheter was formed and injected with contrast material to confirm position. The catheter was flushed and connected to a gravity drainage bag. It was secured at the skin with a Prolene retention suture and Stat-Lock device. COMPLICATIONS: None FINDINGS: After needle puncture of the gallbladder, a dark bile sample was aspirated and sent for culture. The cholecystostomy tube was advanced into the gallbladder lumen and formed. It is now draining bile. This tube will be left to gravity drainage. IMPRESSION: Percutaneous cholecystostomy with placement of 10-French drainage catheter into the gallbladder lumen. This was left to gravity bag drainage. Electronically Signed   By: Aletta Edouard M.D.   On: 06/03/2022 09:21   CT Abdomen Pelvis W Contrast  Result Date: 06/02/2022 CLINICAL DATA:  Right lower quadrant abdominal pain. EXAM: CT ABDOMEN AND PELVIS WITH CONTRAST TECHNIQUE: Multidetector CT imaging of the abdomen and pelvis was performed using the standard protocol following bolus administration of intravenous contrast. RADIATION DOSE REDUCTION: This exam was performed according to the departmental dose-optimization program which includes automated exposure control, adjustment of the mA and/or kV according to patient size and/or use of iterative reconstruction technique. CONTRAST:  53mL OMNIPAQUE IOHEXOL 300 MG/ML  SOLN COMPARISON:  CT chest abdomen and pelvis 03/30/2022 FINDINGS: Lower chest: There is atelectasis in the lung bases. Hepatobiliary: Gallstones are again seen. There is marked diffuse gallbladder wall thickening. The gallbladder is dilated. There are only patchy areas of submucosal enhancement seen in the gallbladder wall. There is mild surrounding inflammatory stranding. No biliary ductal dilatation. Liver is within normal limits. Pancreas: Unremarkable. No pancreatic ductal dilatation or surrounding  inflammatory changes. Spleen: Normal in size without focal abnormality. Adrenals/Urinary Tract: There is a 2.7 cm cyst in the right kidney. Otherwise, the kidneys and adrenal glands are within normal limits. There are few punctate layering calculi in the bladder. Stomach/Bowel: Stomach is within normal limits. Appendix appears normal. No evidence of bowel wall thickening, distention, or inflammatory changes. Vascular/Lymphatic: Aortic atherosclerosis.  No enlarged abdominal or pelvic lymph nodes. Reproductive: Prostate gland is enlarged, unchanged. Other: There is a large left inguinal hernia containing nondilated bowel similar to prior study. No ascites. Musculoskeletal: There is scoliosis of the thoracolumbar spine with degenerative change. IMPRESSION: 1. Findings compatible with acute cholecystitis. Gangrenous cholecystitis is a possibility given hypoenhancement of the gallbladder wall. This can be further evaluated with ultrasound. Surgical consultation recommended. 2. Large left inguinal hernia containing nondilated bowel. 3. 2.7 cm right Bosniak 1 benign simple cyst. No follow-up imaging is recommended. JACR 2018 Feb; 264-273, Management of the Incidental RenalMass on CT, [employer] 2021; 814-848, Bosniak Classification of Cystic Renal Masses, Version 2019. 4.  Aortic Atherosclerosis (ICD10-I70.0). Electronically Signed   By: Ronney Asters M.D.   On: 06/02/2022 17:06

## 2022-06-04 NOTE — Plan of Care (Signed)

## 2022-06-04 NOTE — Evaluation (Signed)
Occupational Therapy Evaluation Patient Details Name: Sean Ward MRN: [id number] DOB: Apr 06, 1941 Today's Date: 06/04/2022   History of Present Illness 81 yo male admitted 06/02/22 with abdominal pain x1 week, N/V. Found to have sepsis due to acute gangrenous cholecystitis s/p cholecystostomy drain 06/02/22. PMH: AFib, Parkinson's disease, scoliosis   Clinical Impression   Patient is s/p cholecystostomy surgery resulting in functional limitations due to the deficits listed below (see OT problem list). Pt currently requesting to eat lunch in bed so positioned in chair position in the bed. Pt expressed needing to talk to MD Thedore Mins regarding POC. Patient will benefit from skilled OT acutely to increase independence and safety with ADLS to allow discharge SNF.       Recommendations for follow up therapy are one component of a multi-disciplinary discharge planning process, led by the attending physician.  Recommendations may be updated based on patient status, additional functional criteria and insurance authorization.   Follow Up Recommendations  Skilled nursing-short term rehab (<3 hours/day)    Assistance Recommended at Discharge Set up Supervision/Assistance  Patient can return home with the following A lot of help with walking and/or transfers;A lot of help with bathing/dressing/bathroom;Assist for transportation    Functional Status Assessment  Patient has had a recent decline in their functional status and demonstrates the ability to make significant improvements in function in a reasonable and predictable amount of time.  Equipment Recommendations  BSC/3in1    Recommendations for Other Services       Precautions / Restrictions Precautions Precautions: Fall Precaution Comments: drain R side, Rush 3L      Mobility Bed Mobility Overal bed mobility: Needs Assistance Bed Mobility: Rolling Rolling: Max assist         General bed mobility comments: pt max (A) to roll R and L  with therapist placing pad in correct position. pt scooted up in bed total+2 max (A) and bed placed in chair position. pt requires pillows to help with alignment of trunk in the bed for self feeding. pillow placed under R elbow to help with arm positioning. pt was able to use bil UE in long sitting to pull trunk forward to have pillow placed along spine for upright posture to eat    Transfers                   General transfer comment: declined but states 'tomorrow would be better"      Balance Overall balance assessment: Needs assistance Sitting-balance support: Bilateral upper extremity supported Sitting balance-Leahy Scale: Poor                                     ADL either performed or assessed with clinical judgement   ADL Overall ADL's : Needs assistance/impaired Eating/Feeding: Set up;Bed level Eating/Feeding Details (indicate cue type and reason): pt was unable to place straw in soda can but could bring spoon to mouth to eat soup Grooming: Set up;Bed level   Upper Body Bathing: Minimal assistance;Bed level   Lower Body Bathing: Maximal assistance;Bed level                         General ADL Comments: Pt declined oob at this time but agreeable to bed level care. Pt reports "i wish you were here to do that before"     Vision Baseline Vision/History: 1 Wears glasses Ability to See in Adequate  Light: 1 Impaired       Perception     Praxis      Pertinent Vitals/Pain Pain Assessment Pain Assessment: No/denies pain     Hand Dominance Right   Extremity/Trunk Assessment Upper Extremity Assessment Upper Extremity Assessment: Generalized weakness (noted to have tremor, able to hold spoon)   Lower Extremity Assessment Lower Extremity Assessment: Defer to PT evaluation   Cervical / Trunk Assessment Cervical / Trunk Assessment: Kyphotic   Communication Communication Communication: No difficulties (speaks itailian, french spanish  and english)   Cognition Arousal/Alertness: Awake/alert Behavior During Therapy: WFL for tasks assessed/performed Overall Cognitive Status: No family/caregiver present to determine baseline cognitive functioning                                 General Comments: oriented to day of the week, pt expressing concerns to stpeak with Dr Thedore Mins regarding weekend     General Comments       Exercises     Shoulder Instructions      Home Living Family/patient expects to be discharged to:: Skilled nursing facility Living Arrangements: Alone                               Additional Comments: Pt living in motel prior to last hospital admission before discharging to SNF.      Prior Functioning/Environment Prior Level of Function : Independent/Modified Independent;Driving             Mobility Comments: unable to ambulate or transfer per pt ADLs Comments: does IADLs        OT Problem List: Decreased strength;Impaired balance (sitting and/or standing);Decreased activity tolerance;Decreased cognition;Decreased safety awareness;Decreased knowledge of use of DME or AE;Decreased knowledge of precautions      OT Treatment/Interventions: Self-care/ADL training;Therapeutic exercise;Energy conservation;DME and/or AE instruction;Manual therapy;Therapeutic activities;Cognitive remediation/compensation;Patient/family education;Balance training    OT Goals(Current goals can be found in the care plan section) Acute Rehab OT Goals Patient Stated Goal: to be abel to eat lunch OT Goal Formulation: With patient Time For Goal Achievement: 06/18/22 Potential to Achieve Goals: Good  OT Frequency: Min 2X/week    Co-evaluation              AM-PAC OT "6 Clicks" Daily Activity     Outcome Measure Help from another person eating meals?: A Little Help from another person taking care of personal grooming?: A Little Help from another person toileting, which includes using  toliet, bedpan, or urinal?: A Lot Help from another person bathing (including washing, rinsing, drying)?: A Lot Help from another person to put on and taking off regular upper body clothing?: A Lot Help from another person to put on and taking off regular lower body clothing?: A Lot 6 Click Score: 14   End of Session Equipment Utilized During Treatment: Oxygen Nurse Communication: Mobility status;Precautions  Activity Tolerance: Patient tolerated treatment well Patient left: in bed;with call bell/phone within reach;with bed alarm set;with SCD's reapplied  OT Visit Diagnosis: Unsteadiness on feet (R26.81);Muscle weakness (generalized) (M62.81)                Time: 3151-7616 OT Time Calculation (min): 23 min Charges:  OT General Charges $OT Visit: 1 Visit OT Evaluation $OT Eval Moderate Complexity: 1 Mod   Brynn, OTR/L  Acute Rehabilitation Services Office: [phone] .   Mateo Flow 06/04/2022, 2:41 PM

## 2022-06-04 NOTE — Progress Notes (Signed)
Subjective/Chief Complaint: Pain well controlled. No nausea or emesis on clears. He is hungry. Denies BM this admission   Objective: Vital signs in last 24 hours: Temp:  [97.5 F (36.4 C)-98.3 F (36.8 C)] 98 F (36.7 C) (08/14 0708) Pulse Rate:  [58-78] 62 (08/14 0808) Resp:  [12-24] 13 (08/14 0808) BP: (80-146)/(52-74) 113/64 (08/14 0808) SpO2:  [94 %-100 %] 98 % (08/14 0808)    Intake/Output from previous day: 08/13 0701 - 08/14 0700 In: 2899.4 [P.O.:120; I.V.:2192; IV Piggyback:582.4] Out: 620 [Urine:500; Drains:120] Intake/Output this shift: No intake/output data recorded.  Exam: Abdomen soft, non tender Drain bloody bilious  Lab Results:  Recent Labs    06/03/22 0155 06/04/22 0303  WBC 14.7* 6.0  HGB 14.2 11.3*  HCT 41.1 33.0*  PLT 225 202    BMET Recent Labs    06/03/22 0155 06/04/22 0303  NA 137 139  K 3.6 3.8  CL 105 112*  CO2 23 21*  GLUCOSE 117* 128*  BUN 37* 26*  CREATININE 1.12 0.96  CALCIUM 8.3* 7.8*    PT/INR Recent Labs    06/02/22 1905  LABPROT 28.2*  INR 2.7*    ABG No results for input(s): "PHART", "HCO3" in the last 72 hours.  Invalid input(s): "PCO2", "PO2"  Studies/Results: IR Perc Cholecystostomy  Result Date: 06/03/2022 CLINICAL DATA:  Acute cholecystitis, sepsis and high surgical risk for immediate cholecystectomy. EXAM: PERCUTANEOUS CHOLECYSTOSTOMY TUBE PLACEMENT COMPARISON:  CT of the abdomen and pelvis on 06/02/2022 ANESTHESIA/SEDATION: Moderate (conscious) sedation was employed during this procedure. A total of Versed 1.0 mg and Fentanyl 50 mcg was administered intravenously by radiology nursing. Moderate Sedation Time: 10 minutes. The patient's level of consciousness and vital signs were monitored continuously by radiology nursing throughout the procedure under my direct supervision. CONTRAST:  10 mL Omnipaque 300 MEDICATIONS: No additional medications. FLUOROSCOPY TIME:  12 seconds.  1.1 mGy. PROCEDURE: The  procedure, risks, benefits, and alternatives were explained to the patient's ex-wife as the patient was confused prior to the procedure. Questions regarding the procedure were encouraged and answered. The patient has ex-wife understands and consents to the procedure. The right abdominal wall was prepped with chlorhexidine in a sterile fashion, and a sterile drape was applied covering the operative field. A sterile gown and sterile gloves were used for the procedure. Local anesthesia was provided with 1% Lidocaine. Ultrasound image documentation was performed. Fluoroscopy during the procedure and fluoro spot radiograph confirms appropriate catheter position. Ultrasound was utilized to localize the gallbladder. Under direct ultrasound guidance, an 18 gauge trocar needle was advanced into the gallbladder lumen. Aspiration was performed and a bile sample sent for culture studies. A small amount of diluted contrast material was injected. A guide wire was then advanced into the gallbladder. Percutaneous tract dilatation was then performed over a guide wire to 10-French. A 10-French pigtail drainage catheter was then advanced into the gallbladder lumen under fluoroscopy. Catheter was formed and injected with contrast material to confirm position. The catheter was flushed and connected to a gravity drainage bag. It was secured at the skin with a Prolene retention suture and Stat-Lock device. COMPLICATIONS: None FINDINGS: After needle puncture of the gallbladder, a dark bile sample was aspirated and sent for culture. The cholecystostomy tube was advanced into the gallbladder lumen and formed. It is now draining bile. This tube will be left to gravity drainage. IMPRESSION: Percutaneous cholecystostomy with placement of 10-French drainage catheter into the gallbladder lumen. This was left to gravity bag drainage. Electronically  Signed   By: Irish Lack M.D.   On: 06/03/2022 09:21   CT Abdomen Pelvis W Contrast  Result  Date: 06/02/2022 CLINICAL DATA:  Right lower quadrant abdominal pain. EXAM: CT ABDOMEN AND PELVIS WITH CONTRAST TECHNIQUE: Multidetector CT imaging of the abdomen and pelvis was performed using the standard protocol following bolus administration of intravenous contrast. RADIATION DOSE REDUCTION: This exam was performed according to the departmental dose-optimization program which includes automated exposure control, adjustment of the mA and/or kV according to patient size and/or use of iterative reconstruction technique. CONTRAST:  62mL OMNIPAQUE IOHEXOL 300 MG/ML  SOLN COMPARISON:  CT chest abdomen and pelvis 03/30/2022 FINDINGS: Lower chest: There is atelectasis in the lung bases. Hepatobiliary: Gallstones are again seen. There is marked diffuse gallbladder wall thickening. The gallbladder is dilated. There are only patchy areas of submucosal enhancement seen in the gallbladder wall. There is mild surrounding inflammatory stranding. No biliary ductal dilatation. Liver is within normal limits. Pancreas: Unremarkable. No pancreatic ductal dilatation or surrounding inflammatory changes. Spleen: Normal in size without focal abnormality. Adrenals/Urinary Tract: There is a 2.7 cm cyst in the right kidney. Otherwise, the kidneys and adrenal glands are within normal limits. There are few punctate layering calculi in the bladder. Stomach/Bowel: Stomach is within normal limits. Appendix appears normal. No evidence of bowel wall thickening, distention, or inflammatory changes. Vascular/Lymphatic: Aortic atherosclerosis. No enlarged abdominal or pelvic lymph nodes. Reproductive: Prostate gland is enlarged, unchanged. Other: There is a large left inguinal hernia containing nondilated bowel similar to prior study. No ascites. Musculoskeletal: There is scoliosis of the thoracolumbar spine with degenerative change. IMPRESSION: 1. Findings compatible with acute cholecystitis. Gangrenous cholecystitis is a possibility given  hypoenhancement of the gallbladder wall. This can be further evaluated with ultrasound. Surgical consultation recommended. 2. Large left inguinal hernia containing nondilated bowel. 3. 2.7 cm right Bosniak 1 benign simple cyst. No follow-up imaging is recommended. JACR 2018 Feb; 264-273, Management of the Incidental RenalMass on CT, [employer] 2021; 814-848, Bosniak Classification of Cystic Renal Masses, Version 2019. 4.  Aortic Atherosclerosis (ICD10-I70.0). Electronically Signed   By: Darliss Cheney M.D.   On: 06/02/2022 17:06    Anti-infectives: Anti-infectives (From admission, onward)    Start     Dose/Rate Route Frequency Ordered Stop   06/03/22 0200  piperacillin-tazobactam (ZOSYN) IVPB 3.375 g        3.375 g 12.5 mL/hr over 240 Minutes Intravenous Every 8 hours 06/02/22 2132     06/02/22 1930  piperacillin-tazobactam (ZOSYN) IVPB 3.375 g        3.375 g 100 mL/hr over 30 Minutes Intravenous  Once 06/02/22 1927 06/02/22 2008   06/02/22 1930  vancomycin (VANCOREADY) IVPB 1500 mg/300 mL        1,500 mg 150 mL/hr over 120 Minutes Intravenous  Once 06/02/22 1930 06/02/22 2145   06/02/22 1730  piperacillin-tazobactam (ZOSYN) IVPB 4.5 g  Status:  Discontinued        4.5 g 200 mL/hr over 30 Minutes Intravenous STAT 06/02/22 1722 06/02/22 1929   06/02/22 1730  vancomycin (VANCOCIN) IVPB 1000 mg/200 mL premix  Status:  Discontinued        1,000 mg 200 mL/hr over 60 Minutes Intravenous STAT 06/02/22 1722 06/02/22 1930       Assessment/Plan: 81 year old male with acute cholecystitis. S/p IR perc chole 8/12  WBC and LFT's improved after percutaneous cholecystostomy tube placement by IR Can advance diet as tolerated from surgical perspective - has SLP eval pending so will not  order diet advancement at this time but discussed with patient and RN  Can transition to oral abx (augmentin) pending SLP eval to complete 7 day course total  We will sign off but please do not hesitate to contact us  with any questions or concerns or reconsult if needed. We will arrange outpatient follow up with Dr. Derrell Lolling.   Per [facility] History of Parkinson's History of A-fib on Eliquis Dyslipidemia  Eric Form, [facility] 06/04/2022, 9:25 AM Please see Amion for pager number during day hours 7:00am-4:30pm

## 2022-06-04 NOTE — Progress Notes (Signed)
Pt has requested to not stick him for additional PIV or Midline until he talked with his attending physician later today.  Pt's primary RN at bedside and was aware of pt's request.

## 2022-06-05 DIAGNOSIS — K810 Acute cholecystitis: Secondary | ICD-10-CM | POA: Diagnosis not present

## 2022-06-05 LAB — COMPREHENSIVE METABOLIC PANEL
ALT: 6 U/L (ref 0–44)
AST: 13 U/L — ABNORMAL LOW (ref 15–41)
Albumin: 2 g/dL — ABNORMAL LOW (ref 3.5–5.0)
Alkaline Phosphatase: 73 U/L (ref 38–126)
Anion gap: 6 (ref 5–15)
BUN: 16 mg/dL (ref 8–23)
CO2: 22 mmol/L (ref 22–32)
Calcium: 8 mg/dL — ABNORMAL LOW (ref 8.9–10.3)
Chloride: 110 mmol/L (ref 98–111)
Creatinine, Ser: 0.86 mg/dL (ref 0.61–1.24)
GFR, Estimated: >60 mL/min (ref >60)
Glucose, Bld: 84 mg/dL (ref 70–99)
Potassium: 3.5 mmol/L (ref 3.5–5.1)
Sodium: 138 mmol/L (ref 135–145)
Total Bilirubin: 1.5 mg/dL — ABNORMAL HIGH (ref 0.3–1.2)
Total Protein: 5 g/dL — ABNORMAL LOW (ref 6.5–8.1)

## 2022-06-05 LAB — CBC WITH DIFFERENTIAL/PLATELET
Abs Immature Granulocytes: 0.14 10*3/uL — ABNORMAL HIGH (ref 0.00–0.07)
Basophils Absolute: 0 10*3/uL (ref 0.0–0.1)
Basophils Relative: 1 %
Eosinophils Absolute: 0.2 10*3/uL (ref 0.0–0.5)
Eosinophils Relative: 3 %
HCT: 38.6 % — ABNORMAL LOW (ref 39.0–52.0)
Hemoglobin: 13.2 g/dL (ref 13.0–17.0)
Immature Granulocytes: 3 %
Lymphocytes Relative: 16 %
Lymphs Abs: 0.9 10*3/uL (ref 0.7–4.0)
MCH: 31.4 pg (ref 26.0–34.0)
MCHC: 34.2 g/dL (ref 30.0–36.0)
MCV: 91.7 fL (ref 80.0–100.0)
Monocytes Absolute: 0.6 10*3/uL (ref 0.1–1.0)
Monocytes Relative: 12 %
Neutro Abs: 3.6 10*3/uL (ref 1.7–7.7)
Neutrophils Relative %: 65 %
Platelets: 248 10*3/uL (ref 150–400)
RBC: 4.21 MIL/uL — ABNORMAL LOW (ref 4.22–5.81)
RDW: 15 % (ref 11.5–15.5)
WBC: 5.5 10*3/uL (ref 4.0–10.5)
nRBC: 0 % (ref 0.0–0.2)

## 2022-06-05 LAB — APTT: aPTT: 138 seconds — ABNORMAL HIGH (ref 24–36)

## 2022-06-05 LAB — BRAIN NATRIURETIC PEPTIDE: B Natriuretic Peptide: 498.6 pg/mL — ABNORMAL HIGH (ref 0.0–100.0)

## 2022-06-05 LAB — HEPARIN LEVEL (UNFRACTIONATED): Heparin Unfractionated: >1.1 IU/mL — ABNORMAL HIGH (ref 0.30–0.70)

## 2022-06-05 LAB — MAGNESIUM: Magnesium: 1.6 mg/dL — ABNORMAL LOW (ref 1.7–2.4)

## 2022-06-05 MED ORDER — APIXABAN 5 MG PO TABS
5.0000 mg | ORAL_TABLET | Freq: Two times a day (BID) | ORAL | Status: DC
  Administered 2022-06-05 – 2022-06-06 (×3): 5 mg via ORAL
  Filled 2022-06-05 (×3): qty 1

## 2022-06-05 MED ORDER — MAGNESIUM SULFATE 4 GM/100ML IV SOLN
4.0000 g | Freq: Once | INTRAVENOUS | Status: AC
  Administered 2022-06-05: 4 g via INTRAVENOUS
  Filled 2022-06-05: qty 100

## 2022-06-05 MED ORDER — PROPRANOLOL HCL ER 60 MG PO CP24
60.0000 mg | ORAL_CAPSULE | Freq: Every day | ORAL | Status: DC
  Administered 2022-06-05: 60 mg via ORAL
  Filled 2022-06-05 (×2): qty 1

## 2022-06-05 MED ORDER — MIDODRINE HCL 5 MG PO TABS
5.0000 mg | ORAL_TABLET | Freq: Three times a day (TID) | ORAL | Status: DC
  Administered 2022-06-05 – 2022-06-06 (×3): 5 mg via ORAL
  Filled 2022-06-05 (×4): qty 1

## 2022-06-05 MED ORDER — DOCUSATE SODIUM 50 MG PO CAPS
50.0000 mg | ORAL_CAPSULE | Freq: Once | ORAL | Status: AC
  Administered 2022-06-05: 50 mg via ORAL
  Filled 2022-06-05: qty 1

## 2022-06-05 MED ORDER — POTASSIUM CHLORIDE 2 MEQ/ML IV SOLN
INTRAVENOUS | Status: AC
  Filled 2022-06-05 (×2): qty 1000

## 2022-06-05 MED ORDER — MELATONIN 3 MG PO TABS
3.0000 mg | ORAL_TABLET | Freq: Once | ORAL | Status: AC
  Administered 2022-06-05: 3 mg via ORAL
  Filled 2022-06-05: qty 1

## 2022-06-05 NOTE — Progress Notes (Signed)
ANTICOAGULATION CONSULT NOTE - Initial Consult  Pharmacy Consult for Heparin transition to Apixaban Indication: atrial fibrillation  No Known Allergies  Patient Measurements:   Heparin Dosing Weight: 72.6 (TBW)  Vital Signs: Temp: 98.1 F (36.7 C) (08/15 0902) Temp Source: Oral (08/15 0739) BP: 130/73 (08/15 0902) Pulse Rate: 74 (08/15 0902)  Labs: Recent Labs    06/02/22 1905 06/03/22 0155 06/03/22 2128 06/04/22 0303 06/05/22 0750  HGB  --  14.2  --  11.3* 13.2  HCT  --  41.1  --  33.0* 38.6*  PLT  --  225  --  202 248  APTT 48*  --  98* 93* 138*  LABPROT 28.2*  --   --   --   --   INR 2.7*  --   --   --   --   HEPARINUNFRC  --   --  >1.10* >1.10* >1.10*  CREATININE  --  1.12  --  0.96 0.86    CrCl cannot be calculated (Unknown ideal weight.).   Assessment: Sean Ward is a 81 y.o. male presenting to the hospital for treatment of gangrenous cholecystitis. PMH is significant for afib on Eliquis 5mg  BID PTA. Eliquis has been on hold for surgical cholecystostomy drain placement on 8/12. Last dose of eliquis 8/12 AM.  Pharmacy has been dosing heparin dosing for afib in the post-op setting. Pharmacy now consulted to transition heparin to apixaban for AFib.  aPTT: 138, supratherapeutic Heparin level: >1.1, supratherapeutic in the setting of recent apixaban administration Current heparin infusion rate: 1100 units/hr No s/sx of bleeding noted by provider  Goal of Therapy:  Monitor platelets by anticoagulation protocol: Yes   Plan:  Discontinue heparin infusion at 11:00 Start apixaban 5mg  po q12h at 12:00 (1 hr after discontinuing heparin infusion in setting of elevated aPTT) Monitor daily CBC and for s/sx of bleeding.   10/12, PharmD, BCPS Clinical Pharmacist 06/05/2022 11:16 AM   Please refer to [facility] for pharmacy phone number

## 2022-06-05 NOTE — Progress Notes (Signed)
PROGRESS NOTE                                                                                                                                                                                                             Patient Demographics:    Sean Ward, is a 81 y.o. male, DOB - 02/19/1941, ZOX:[id number]  Outpatient Primary MD for the patient is [name], MD    LOS - 3  Admit date - 06/02/2022    Chief Complaint  Patient presents with   Abdominal Pain       Brief Narrative (HPI from H&P)   81 y.o. male with history of A-fib, Parkinson's disease has been experiencing abdominal pain for the last 1 week with associated nausea and vomiting, in the ER he was diagnosed with acute cholecystitis with sepsis, he was seen by general surgery who recommended that patient be referred for cholecystostomy drain placement, he was admitted by hospitalist team for further care.   Subjective:   Patient in bed, appears comfortable, denies any headache, no fever, no chest pain or pressure, no shortness of breath , no abdominal pain. No new focal weakness.   Assessment  & Plan :    Sepsis secondary to acute gangrenous cholecystitis s/p cholecystostomy drain placed by IR on 06/02/2022. He been seen by IR and general surgery, he has undergone cholecystostomy drain placement, sepsis pathophysiology has resolved, started cutting down midodrine, continue empiric IV antibiotics, gentle IV fluids, monitor cultures, MRSA nasal PCR negative.  Drain care per IR - flush drain QD with 5 cc NS, record output QD, dressing changes every 2-3 days or earlier if soiled.   History of paroxysmal A-fib presently heart rate is in sinus rhythm and around 60 bpm.  [place] vasc 2 score of greater than 3.  Perioperative was kept on heparin drip, resume back on Eliquis on 06/05/2022.  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  At risk for  delirium, minimize narcotics and benzodiazepines.  ? Mild dysphagia - SLP eval, stable on Regular, resolved.  Hypomagnesemia.  Replaced.      Condition - Extremely Guarded  Family Communication  :    Called wife Dois Davenport [phone] on 06/03/2022 message left at 7:50 AM, 06/04/2022 at 8:14 AM  Code Status :  Full  Consults  :  CCS, IR  PUD Prophylaxis : PPI   Procedures  :     CT - 1. Findings compatible with acute cholecystitis. Gangrenous cholecystitis is a possibility given hypoenhancement of the gallbladder wall. This can be further evaluated with ultrasound. Surgical consultation recommended. 2. Large left inguinal hernia containing nondilated bowel. 3. 2.7 cm right Bosniak 1 benign simple cyst. No follow-up imaging is recommended.  Cholecystostomy drain placed by IR on 06/02/2022.      Disposition Plan  :    Status is: Inpatient  DVT Prophylaxis  :  Heparin added  Place TED hose Start: 06/03/22 1738 SCDs Start: 06/02/22 2130    Lab Results  Component Value Date   PLT 248 06/05/2022    Diet :  Diet Order             Diet regular Room service appropriate? Yes; Fluid consistency: Thin  Diet effective now                    Inpatient Medications  Scheduled Meds:  carbidopa-levodopa  1 tablet Oral QHS   carbidopa-levodopa  1.5 tablet Oral 5 X Daily   midodrine  10 mg Oral TID WC   pantoprazole  40 mg Oral Daily   sodium chloride flush  5 mL Intracatheter Q8H   Continuous Infusions:  heparin 1,100 Units/hr (06/04/22 1211)   piperacillin-tazobactam (ZOSYN)  IV 3.375 g (06/05/22 0312)   PRN Meds:.  Time Spent in minutes  30   Susa Raring M.D on 06/05/2022 at 9:59 AM  To page go to [email or web address]   [facility] -  Office  [phone]  See all Orders from today for further details    Objective:   Vitals:   06/05/22 0600 06/05/22 0739 06/05/22 0839 06/05/22 0902  BP:  129/82 129/81 130/73  Pulse: 86 78 75 74  Resp: (!) 22 16 13  18   Temp:  98 F (36.7 C)  98.1 F (36.7 C)  TempSrc:  Oral    SpO2: 95% 97% 100%     Wt Readings from Last 3 Encounters:  05/21/22 72.6 kg  05/02/22 74.2 kg  04/17/22 74.2 kg     Intake/Output Summary (Last 24 hours) at 06/05/2022 0959 Last data filed at 06/05/2022 06/07/2022 Gross per 24 hour  Intake 366.16 ml  Output 350 ml  Net 16.16 ml     Physical Exam  Awake, minimally confused,, No new F.N deficits, right upper quadrant gallbladder drain in place, chronic hydrocele .AT,PERRAL Supple Neck, No JVD,   Symmetrical Chest wall movement, Good air movement bilaterally, CTAB RRR,No Gallops, Rubs or new Murmurs,  +ve B.Sounds, Abd Soft, No tenderness,   No Cyanosis, Clubbing or edema       RN pressure injury documentation: Pressure Injury 03/30/22 Hip Posterior;Proximal;Right Stage 2 -  Partial thickness loss of dermis presenting as a shallow open injury with a red, pink wound bed without slough. (Active)  03/30/22 2100  Location: Hip  Location Orientation: Posterior;Proximal;Right  Staging: Stage 2 -  Partial thickness loss of dermis presenting as a shallow open injury with a red, pink wound bed without slough.  Wound Description (Comments):   Present on Admission: Yes     Pressure Injury 03/30/22 Lumbar Lateral;Right Stage 1 -  Intact skin with non-blanchable redness of a localized area usually over a bony prominence. (Active)  03/30/22 2100  Location: Lumbar  Location Orientation: Lateral;Right  Staging: Stage 1 -  Intact skin with non-blanchable redness of a localized area usually over  a bony prominence.  Wound Description (Comments):   Present on Admission: Yes     Pressure Injury 03/31/22 Lumbar Right Stage 2 -  Partial thickness loss of dermis presenting as a shallow open injury with a red, pink wound bed without slough. (Active)  03/31/22   Location: Lumbar  Location Orientation: Right  Staging: Stage 2 -  Partial thickness loss of dermis presenting as a  shallow open injury with a red, pink wound bed without slough.  Wound Description (Comments):   Present on Admission:      Data Review:    CBC Recent Labs  Lab 06/02/22 1426 06/03/22 0155 06/04/22 0303 06/05/22 0750  WBC 18.3* 14.7* 6.0 5.5  HGB 15.6 14.2 11.3* 13.2  HCT 44.4 41.1 33.0* 38.6*  PLT 268 225 202 248  MCV 91.4 92.4 91.9 91.7  MCH 32.1 31.9 31.5 31.4  MCHC 35.1 34.5 34.2 34.2  RDW 15.5 15.5 15.4 15.0  LYMPHSABS  --  0.6* 0.9 0.9  MONOABS  --  1.3* 0.7 0.6  EOSABS  --  0.0 0.1 0.2  BASOSABS  --  0.0 0.0 0.0    Electrolytes Recent Labs  Lab 06/02/22 1426 06/02/22 1643 06/02/22 1905 06/02/22 2042 06/03/22 0155 06/04/22 0303 06/05/22 0750  NA 136  --   --   --  137 139 138  K 3.8  --   --   --  3.6 3.8 3.5  CL 104  --   --   --  105 112* 110  CO2 23  --   --   --  23 21* 22  GLUCOSE 170*  --   --   --  117* 128* 84  BUN 41*  --   --   --  37* 26* 16  CREATININE 1.17  --   --   --  1.12 0.96 0.86  CALCIUM 8.9  --   --   --  8.3* 7.8* 8.0*  AST 32  --   --   --  21 14* 13*  ALT 12  --   --   --  6 5 6   ALKPHOS 100  --   --   --  87 62 73  BILITOT 2.3*  --   --   --  1.9* 1.6* 1.5*  ALBUMIN 2.4*  --   --   --  2.0* 1.7* 2.0*  MG  --   --   --   --   --  1.8 1.6*  LATICACIDVEN 1.8 2.5*  --  1.8  --   --   --   INR  --   --  2.7*  --   --   --   --   BNP  --   --   --   --   --  333.8* 498.6*    Radiology Reports IR Perc Cholecystostomy  Result Date: 06/03/2022 CLINICAL DATA:  Acute cholecystitis, sepsis and high surgical risk for immediate cholecystectomy. EXAM: PERCUTANEOUS CHOLECYSTOSTOMY TUBE PLACEMENT COMPARISON:  CT of the abdomen and pelvis on 06/02/2022 ANESTHESIA/SEDATION: Moderate (conscious) sedation was employed during this procedure. A total of Versed 1.0 mg and Fentanyl 50 mcg was administered intravenously by radiology nursing. Moderate Sedation Time: 10 minutes. The patient's level of consciousness and vital signs were monitored  continuously by radiology nursing throughout the procedure under my direct supervision. CONTRAST:  10 mL Omnipaque 300 MEDICATIONS: No additional medications. FLUOROSCOPY TIME:  12 seconds.  1.1 mGy. PROCEDURE: The procedure,  risks, benefits, and alternatives were explained to the patient's ex-wife as the patient was confused prior to the procedure. Questions regarding the procedure were encouraged and answered. The patient has ex-wife understands and consents to the procedure. The right abdominal wall was prepped with chlorhexidine in a sterile fashion, and a sterile drape was applied covering the operative field. A sterile gown and sterile gloves were used for the procedure. Local anesthesia was provided with 1% Lidocaine. Ultrasound image documentation was performed. Fluoroscopy during the procedure and fluoro spot radiograph confirms appropriate catheter position. Ultrasound was utilized to localize the gallbladder. Under direct ultrasound guidance, an 18 gauge trocar needle was advanced into the gallbladder lumen. Aspiration was performed and a bile sample sent for culture studies. A small amount of diluted contrast material was injected. A guide wire was then advanced into the gallbladder. Percutaneous tract dilatation was then performed over a guide wire to 10-French. A 10-French pigtail drainage catheter was then advanced into the gallbladder lumen under fluoroscopy. Catheter was formed and injected with contrast material to confirm position. The catheter was flushed and connected to a gravity drainage bag. It was secured at the skin with a Prolene retention suture and Stat-Lock device. COMPLICATIONS: None FINDINGS: After needle puncture of the gallbladder, a dark bile sample was aspirated and sent for culture. The cholecystostomy tube was advanced into the gallbladder lumen and formed. It is now draining bile. This tube will be left to gravity drainage. IMPRESSION: Percutaneous cholecystostomy with  placement of 10-French drainage catheter into the gallbladder lumen. This was left to gravity bag drainage. Electronically Signed   By: Aletta Edouard M.D.   On: 06/03/2022 09:21   CT Abdomen Pelvis W Contrast  Result Date: 06/02/2022 CLINICAL DATA:  Right lower quadrant abdominal pain. EXAM: CT ABDOMEN AND PELVIS WITH CONTRAST TECHNIQUE: Multidetector CT imaging of the abdomen and pelvis was performed using the standard protocol following bolus administration of intravenous contrast. RADIATION DOSE REDUCTION: This exam was performed according to the departmental dose-optimization program which includes automated exposure control, adjustment of the mA and/or kV according to patient size and/or use of iterative reconstruction technique. CONTRAST:  31mL OMNIPAQUE IOHEXOL 300 MG/ML  SOLN COMPARISON:  CT chest abdomen and pelvis 03/30/2022 FINDINGS: Lower chest: There is atelectasis in the lung bases. Hepatobiliary: Gallstones are again seen. There is marked diffuse gallbladder wall thickening. The gallbladder is dilated. There are only patchy areas of submucosal enhancement seen in the gallbladder wall. There is mild surrounding inflammatory stranding. No biliary ductal dilatation. Liver is within normal limits. Pancreas: Unremarkable. No pancreatic ductal dilatation or surrounding inflammatory changes. Spleen: Normal in size without focal abnormality. Adrenals/Urinary Tract: There is a 2.7 cm cyst in the right kidney. Otherwise, the kidneys and adrenal glands are within normal limits. There are few punctate layering calculi in the bladder. Stomach/Bowel: Stomach is within normal limits. Appendix appears normal. No evidence of bowel wall thickening, distention, or inflammatory changes. Vascular/Lymphatic: Aortic atherosclerosis. No enlarged abdominal or pelvic lymph nodes. Reproductive: Prostate gland is enlarged, unchanged. Other: There is a large left inguinal hernia containing nondilated bowel similar to prior  study. No ascites. Musculoskeletal: There is scoliosis of the thoracolumbar spine with degenerative change. IMPRESSION: 1. Findings compatible with acute cholecystitis. Gangrenous cholecystitis is a possibility given hypoenhancement of the gallbladder wall. This can be further evaluated with ultrasound. Surgical consultation recommended. 2. Large left inguinal hernia containing nondilated bowel. 3. 2.7 cm right Bosniak 1 benign simple cyst. No follow-up imaging is recommended. JACR  2018 Feb; 264-273, Management of the Incidental RenalMass on CT, [employer] 2021; 814-848, Bosniak Classification of Cystic Renal Masses, Version 2019. 4.  Aortic Atherosclerosis (ICD10-I70.0). Electronically Signed   By: Ronney Asters M.D.   On: 06/02/2022 17:06

## 2022-06-05 NOTE — Care Management Important Message (Signed)
Important Message  Patient Details  Name: Sean Ward MRN: [id number] Date of Birth: October 11, 1941   Medicare Important Message Given:  Yes     Sherilyn Banker 06/05/2022, 12:05 PM

## 2022-06-05 NOTE — Progress Notes (Signed)
Physical Therapy Treatment Patient Details Name: Sean Ward MRN: [id number] DOB: December 17, 1940 Today's Date: 06/05/2022   History of Present Illness 81 yo male admitted 06/02/22 with abdominal pain x1 week, N/V. Found to have sepsis due to acute gangrenous cholecystitis s/p cholecystostomy drain 06/02/22. PMH: AFib, Parkinson's disease, scoliosis    PT Comments    Continuing work on functional mobility and activity tolerance;  Session focused on more upright activity including sitting EOB, standing trials to stedy, and transferring OOB to recliner using the stedy; Pt has a posterior bias in sitting and standing, and will drift into posterior lean; Used stedy to help with inviting pt to flex at hips and lean forward by reaching for and pulling up on the bar in the front of the stedy; Able to use the stedy for safe transfer OOB to recliner, but noteworthy that pt reported dizziness; BPs as follows:  Before session: 129/81  Siting in recliner, Immediately after standing, dizzy, feet down: 104/70  After about 3 minutes sitting in recliner, feet down: 95/65  After 3 more minutes, feet up in recliner: 109/74  Pt's RN notified of BP observations; ended session with pt in recliner, chair alarm on, needs met.   Recommendations for follow up therapy are one component of a multi-disciplinary discharge planning process, led by the attending physician.  Recommendations may be updated based on patient status, additional functional criteria and insurance authorization.  Follow Up Recommendations  Skilled nursing-short term rehab (<3 hours/day) Can patient physically be transported by private vehicle: No   Assistance Recommended at Discharge Frequent or constant Supervision/Assistance  Patient can return home with the following A lot of help with bathing/dressing/bathroom;A lot of help with walking and/or transfers;Assist for transportation;Assistance with cooking/housework;Help with stairs or ramp  for entrance   Equipment Recommendations  None recommended by PT    Recommendations for Other Services       Precautions / Restrictions Precautions Precautions: Fall Precaution Comments: drain R side, [place] 3L     Mobility  Bed Mobility Overal bed mobility: Needs Assistance Bed Mobility: Rolling, Sidelying to Sit Rolling: Max assist Sidelying to sit: Max assist       General bed mobility comments: Rolled R to come off of bedpan adn for better bed pad placement; tried to perform bridging to unroll the rest of teh bed pad -- incr time to explain and cue for pushing feet into bed and extending hips for rise, but eventuall able to bridge with heavy assist to stabilize feet on bed; @ person max assist to elevate trunk to sit after helping feet clear EOB    Transfers Overall transfer level: Needs assistance Equipment used: Ambulation equipment used Charlaine Dalton) Transfers: Sit to/from Stand Sit to Stand: Max assist, +2 physical assistance, +2 safety/equipment           General transfer comment: tends to drift into posterior lean and pattern of trunk extension; Multimodal cues to flex at hips, and initiate sit to stand with forward weight shift; incr time to prep to stand as pt's feet tended to push forward on stedy platform, requiring cues and assist for correction; Max assist to rise; noted audible crepitus R knee; difficulty organizing trunk and weight on LEs and bottom in the high seat of the stedy, but able to stabilize enough to roll to the recliner    Ambulation/Gait                   Stairs  Wheelchair Mobility    Modified Rankin (Stroke Patients Only)       Balance     Sitting balance-Leahy Scale: Poor                                      Cognition Arousal/Alertness: Awake/alert Behavior During Therapy: WFL for tasks assessed/performed Overall Cognitive Status: No family/caregiver present to determine baseline cognitive  functioning                                          Exercises      General Comments General comments (skin integrity, edema, etc.): Reported dizziness with incr time in upright postures in semi-standing in stedy; see above for serial BPs; reported to RN      Pertinent Vitals/Pain Pain Assessment Pain Assessment: Faces Faces Pain Scale: Hurts little more Pain Location: R knee with OA, noted audible crepitus Pain Descriptors / Indicators: Discomfort, Grimacing Pain Intervention(s): Repositioned    Home Living                          Prior Function            PT Goals (current goals can now be found in the care plan section) Acute Rehab PT Goals Patient Stated Goal: to get back to living on his own PT Goal Formulation: With patient Time For Goal Achievement: 06/18/22 Potential to Achieve Goals: Fair Progress towards PT goals: Progressing toward goals    Frequency    Min 2X/week      PT Plan Current plan remains appropriate    Co-evaluation PT/OT/SLP Co-Evaluation/Treatment: Yes Reason for Co-Treatment: Necessary to address cognition/behavior during functional activity;For patient/therapist safety;To address functional/ADL transfers (pt showing limited activity tolerance) PT goals addressed during session: Mobility/safety with mobility        AM-PAC PT "6 Clicks" Mobility   Outcome Measure  Help needed turning from your back to your side while in a flat bed without using bedrails?: A Lot Help needed moving from lying on your back to sitting on the side of a flat bed without using bedrails?: A Lot Help needed moving to and from a bed to a chair (including a wheelchair)?: Total Help needed standing up from a chair using your arms (e.g., wheelchair or bedside chair)?: Total Help needed to walk in hospital room?: Total Help needed climbing 3-5 steps with a railing? : Total 6 Click Score: 8    End of Session Equipment Utilized During  Treatment: Gait belt;Oxygen (stedy) Activity Tolerance: Patient tolerated treatment well Patient left: in chair;with call bell/phone within reach;with chair alarm set Nurse Communication: Mobility status PT Visit Diagnosis: Other abnormalities of gait and mobility (R26.89);Muscle weakness (generalized) (M62.81)     Time: 7416-3845 PT Time Calculation (min) (ACUTE ONLY): 44 min  Charges:  $Therapeutic Activity: 8-22 mins                     Roney Marion, PT  Acute Rehabilitation Services Office [phone]    Colletta [place] 06/05/2022, 1:48 PM

## 2022-06-05 NOTE — Plan of Care (Signed)

## 2022-06-05 NOTE — Progress Notes (Signed)
Pharmacy Antibiotic Note  Sean Ward is a 81 y.o. male for which pharmacy has been consulted for zosyn dosing for acute cholecystitis and sepsis  Patient with a history of  parkinson's and AF on eliquis . Patient presented with abdominal pain, nausea, vomiting, leukocytosis and CT evidence of acute cholecystitis.  Pt is not a candidate for operative cholecystecomy and had percutaneous cholecystostomy tube placed by IR on 8/12. Vancomycin given x 1 in ED.  SCr down to 0.86 WBC down to 5.5, afebrile Perc Chole drain output: 29ml in past 24h (trending down)  Plan: Continue Zosyn 3.375g IV q8h (4 hour infusion) Trend WBC, temp, Renal function, & Clinical course F/u cultures and de-escalate when able    Temp (24hrs), Avg:97.9 F (36.6 C), Min:97.7 F (36.5 C), Max:98.1 F (36.7 C)  Recent Labs  Lab 06/02/22 1426 06/02/22 1643 06/02/22 2042 06/03/22 0155 06/04/22 0303 06/05/22 0750  WBC 18.3*  --   --  14.7* 6.0 5.5  CREATININE 1.17  --   --  1.12 0.96 0.86  LATICACIDVEN 1.8 2.5* 1.8  --   --   --      CrCl cannot be calculated (Unknown ideal weight.).    No Known Allergies  Antimicrobials this admission: Zosyn 8/12 >>  Vancomycin x 1 in ED 8/12 >>  Microbiology results: 8/12 Blood cx > NGTD x3d 8/12 Wound Cx > NGTD x3d 8/13 MRSA PCR: not detected  Thank you for allowing pharmacy to be a part of this patient's care.  Wilburn Cornelia, PharmD, BCPS Clinical Pharmacist 06/05/2022 1:47 PM   Please refer to [facility] for pharmacy phone number

## 2022-06-05 NOTE — Progress Notes (Signed)
Referring Physician(s): Vonita Moss   Supervising Physician: Marliss Coots  Patient Status:  [name] - In-pt  Chief Complaint:  F/u Perc Chole   Brief History:  Sean Ward is a 81 y.o. male who presented to the ED on 06/02/22 with abdominal pain, nausea, vomiting, leukocytosis.   CT showed of acute cholecystitis.   He was evaluated by General Surgery and felt to be high risk for emergent cholecystectomy given anticoagulation with Eliquis and comorbidities.   Worsening clinical condition in ED over several hours, becoming more obtunded with worsening VS and rising lactate.  He underwent perc chole by Dr.Yamagata on 06/02/22  Subjective:  No complaints  Allergies: Patient has no known allergies.  Medications: Prior to Admission medications   Medication Sig Start Date End Date Taking? Authorizing Provider  apixaban (ELIQUIS) 5 MG TABS tablet TAKE 1 TABLET (5 MG TOTAL) BY MOUTH 2 (TWO) TIMES DAILY. Patient taking differently: Take 5 mg by mouth 2 (two) times daily with a meal. 05/16/22 05/16/23 Yes [name], MD  carbidopa-levodopa (SINEMET CR) 50-200 MG tablet TAKE 1 TABLET BY MOUTH AT BEDTIME. 05/16/22  Yes [name], MD  carbidopa-levodopa (SINEMET IR) 25-100 MG tablet TAKE 1 & 1/2 TABLETS BY  MOUTH 5 TIMES A DAY, AT 8 AM, 11 AM, 2 PM, 5 PM AND 8 PM. Patient taking differently: Take 1.5 tablets by mouth See admin instructions. Take 1 1/2 tablet by mouth 5 times daily - 8am, 11am, 2pm, 5pm and 8pm 05/16/22 05/16/23 Yes [name], MD  melatonin 5 MG TABS Take 5 mg by mouth at bedtime.   Yes [provider]  propranolol ER (INDERAL LA) 60 MG 24 hr capsule TAKE 1 CAPSULE (60 MG TOTAL) BY MOUTH DAILY. Patient taking differently: Take 60 mg by mouth at bedtime. 05/16/22  Yes [name], MD     Vital Signs: BP 106/74 (BP Location: Left Arm)   Pulse 75   Temp 97.7 F (36.5 C)   Resp 18   SpO2 97%   Physical Exam Constitutional:       Appearance: Normal appearance.  HENT:     Head: Normocephalic and atraumatic.  Cardiovascular:     Rate and Rhythm: Normal rate.  Pulmonary:     Effort: Pulmonary effort is normal. No respiratory distress.  Abdominal:     Palpations: Abdomen is soft.     Tenderness: There is no abdominal tenderness.  Skin:    General: Skin is warm and dry.  Neurological:     General: No focal deficit present.     Mental Status: He is alert and oriented to person, place, and time.  Psychiatric:        Mood and Affect: Mood normal.        Behavior: Behavior normal.        Thought Content: Thought content normal.        Judgment: Judgment normal.   Drain Location: RUQ Size: Fr size: 10 Fr Date of placement: 06/02/22  Currently to: Drain collection device: gravity 24 hour output:  Output by Drain (mL) 06/03/22 0701 - 06/03/22 1900 06/03/22 1901 - 06/04/22 0700 06/04/22 0701 - 06/04/22 1900 06/04/22 1901 - 06/05/22 0700 06/05/22 0701 - 06/05/22 1557  Closed System Drain 1 Lateral RUQ Other (Comment) 10.2 Fr. 50 70  50    Current examination: Flushes/aspirates easily.  Insertion site unremarkable. Suture and stat lock in place. Dressed appropriately.    Imaging: IR Perc Cholecystostomy  Result  Date: 06/03/2022 CLINICAL DATA:  Acute cholecystitis, sepsis and high surgical risk for immediate cholecystectomy. EXAM: PERCUTANEOUS CHOLECYSTOSTOMY TUBE PLACEMENT COMPARISON:  CT of the abdomen and pelvis on 06/02/2022 ANESTHESIA/SEDATION: Moderate (conscious) sedation was employed during this procedure. A total of Versed 1.0 mg and Fentanyl 50 mcg was administered intravenously by radiology nursing. Moderate Sedation Time: 10 minutes. The patient's level of consciousness and vital signs were monitored continuously by radiology nursing throughout the procedure under my direct supervision. CONTRAST:  10 mL Omnipaque 300 MEDICATIONS: No additional medications. FLUOROSCOPY TIME:  12 seconds.  1.1 mGy.  PROCEDURE: The procedure, risks, benefits, and alternatives were explained to the patient's ex-wife as the patient was confused prior to the procedure. Questions regarding the procedure were encouraged and answered. The patient has ex-wife understands and consents to the procedure. The right abdominal wall was prepped with chlorhexidine in a sterile fashion, and a sterile drape was applied covering the operative field. A sterile gown and sterile gloves were used for the procedure. Local anesthesia was provided with 1% Lidocaine. Ultrasound image documentation was performed. Fluoroscopy during the procedure and fluoro spot radiograph confirms appropriate catheter position. Ultrasound was utilized to localize the gallbladder. Under direct ultrasound guidance, an 18 gauge trocar needle was advanced into the gallbladder lumen. Aspiration was performed and a bile sample sent for culture studies. A small amount of diluted contrast material was injected. A guide wire was then advanced into the gallbladder. Percutaneous tract dilatation was then performed over a guide wire to 10-French. A 10-French pigtail drainage catheter was then advanced into the gallbladder lumen under fluoroscopy. Catheter was formed and injected with contrast material to confirm position. The catheter was flushed and connected to a gravity drainage bag. It was secured at the skin with a Prolene retention suture and Stat-Lock device. COMPLICATIONS: None FINDINGS: After needle puncture of the gallbladder, a dark bile sample was aspirated and sent for culture. The cholecystostomy tube was advanced into the gallbladder lumen and formed. It is now draining bile. This tube will be left to gravity drainage. IMPRESSION: Percutaneous cholecystostomy with placement of 10-French drainage catheter into the gallbladder lumen. This was left to gravity bag drainage. Electronically Signed   By: Irish Lack M.D.   On: 06/03/2022 09:21   CT Abdomen Pelvis W  Contrast  Result Date: 06/02/2022 CLINICAL DATA:  Right lower quadrant abdominal pain. EXAM: CT ABDOMEN AND PELVIS WITH CONTRAST TECHNIQUE: Multidetector CT imaging of the abdomen and pelvis was performed using the standard protocol following bolus administration of intravenous contrast. RADIATION DOSE REDUCTION: This exam was performed according to the departmental dose-optimization program which includes automated exposure control, adjustment of the mA and/or kV according to patient size and/or use of iterative reconstruction technique. CONTRAST:  5mL OMNIPAQUE IOHEXOL 300 MG/ML  SOLN COMPARISON:  CT chest abdomen and pelvis 03/30/2022 FINDINGS: Lower chest: There is atelectasis in the lung bases. Hepatobiliary: Gallstones are again seen. There is marked diffuse gallbladder wall thickening. The gallbladder is dilated. There are only patchy areas of submucosal enhancement seen in the gallbladder wall. There is mild surrounding inflammatory stranding. No biliary ductal dilatation. Liver is within normal limits. Pancreas: Unremarkable. No pancreatic ductal dilatation or surrounding inflammatory changes. Spleen: Normal in size without focal abnormality. Adrenals/Urinary Tract: There is a 2.7 cm cyst in the right kidney. Otherwise, the kidneys and adrenal glands are within normal limits. There are few punctate layering calculi in the bladder. Stomach/Bowel: Stomach is within normal limits. Appendix appears normal. No evidence of  bowel wall thickening, distention, or inflammatory changes. Vascular/Lymphatic: Aortic atherosclerosis. No enlarged abdominal or pelvic lymph nodes. Reproductive: Prostate gland is enlarged, unchanged. Other: There is a large left inguinal hernia containing nondilated bowel similar to prior study. No ascites. Musculoskeletal: There is scoliosis of the thoracolumbar spine with degenerative change. IMPRESSION: 1. Findings compatible with acute cholecystitis. Gangrenous cholecystitis is a  possibility given hypoenhancement of the gallbladder wall. This can be further evaluated with ultrasound. Surgical consultation recommended. 2. Large left inguinal hernia containing nondilated bowel. 3. 2.7 cm right Bosniak 1 benign simple cyst. No follow-up imaging is recommended. JACR 2018 Feb; 264-273, Management of the Incidental RenalMass on CT, [employer] 2021; 814-848, Bosniak Classification of Cystic Renal Masses, Version 2019. 4.  Aortic Atherosclerosis (ICD10-I70.0). Electronically Signed   By: Darliss Cheney M.D.   On: 06/02/2022 17:06    Labs:  CBC: Recent Labs    06/02/22 1426 06/03/22 0155 06/04/22 0303 06/05/22 0750  WBC 18.3* 14.7* 6.0 5.5  HGB 15.6 14.2 11.3* 13.2  HCT 44.4 41.1 33.0* 38.6*  PLT 268 225 202 248    COAGS: Recent Labs    03/30/22 1425 06/02/22 1905 06/03/22 2128 06/04/22 0303 06/05/22 0750  INR 1.4* 2.7*  --   --   --   APTT  --  48* 98* 93* 138*    BMP: Recent Labs    06/02/22 1426 06/03/22 0155 06/04/22 0303 06/05/22 0750  NA 136 137 139 138  K 3.8 3.6 3.8 3.5  CL 104 105 112* 110  CO2 23 23 21* 22  GLUCOSE 170* 117* 128* 84  BUN 41* 37* 26* 16  CALCIUM 8.9 8.3* 7.8* 8.0*  CREATININE 1.17 1.12 0.96 0.86  GFRNONAA >60 >60 >60 >60    LIVER FUNCTION TESTS: Recent Labs    06/02/22 1426 06/03/22 0155 06/04/22 0303 06/05/22 0750  BILITOT 2.3* 1.9* 1.6* 1.5*  AST 32 21 14* 13*  ALT 12 6 5 6   ALKPHOS 100 87 62 73  PROT 5.9* 5.1* 4.5* 5.0*  ALBUMIN 2.4* 2.0* 1.7* 2.0*    Assessment and Plan:  Continue flushes as ordered with 5 cc NS. Record output Q shift. Dressing changes QD or PRN if soiled.  Call IR APP or on call IR MD if difficulty flushing or sudden change in drain output.  Repeat imaging/possible drain injection once output < 10 mL/QD (excluding flush material). Consideration for drain removal if output is < 10 mL/QD (excluding flush material), pending discussion with the providing surgical service.  Discharge  planning: Percutaneous cholecystostomy drain to remain in place at least 6 weeks.   Recommend fluoroscopy with injection of the drain in IR to evaluate for patency of the cystic duct.  If the duct is patent and general surgery feels patient is stable for cholecystectomy, the drain would be removed at time of surgery.  If the duct is patent and general surgery feels patient is NEVER a candidate for cholecystectomy, drain can be capped for a trial.  If symptoms recur, then place to gravity bag again.  If trial is successful, discuss possible removal of the drain.  Please call the IR PA at [phone] when patient is about to be discharged and we will arrange the follow up drain injection (ok to leave message).     Electronically Signed: [phone], PA-C 06/05/2022, 3:57 PM    I spent a total of 15 Minutes at the the patient's bedside AND on the patient's hospital floor or unit, greater than 50%  of which was counseling/coordinating care for f/u perc chole.

## 2022-06-05 NOTE — Progress Notes (Signed)
Occupational Therapy Treatment Patient Details Name: Sean Ward MRN: [id number] DOB: 12/31/1940 Today's Date: 06/05/2022   History of present illness 81 yo male admitted 06/02/22 with abdominal pain x1 week, N/V. Found to have sepsis due to acute gangrenous cholecystitis s/p cholecystostomy drain 06/02/22. PMH: AFib, Parkinson's disease, scoliosis   OT comments  Pt oob to chair this session with extensive (A) using the stedy total+2. Recommend transfers with hoyer at this time. Pt tangential at times during session demonstrating some executive cognitive deficits. Pt uses humor at times to mask his cognitive deficits with challenges from therapist. Recommendation for [facility]   Recommendations for follow up therapy are one component of a multi-disciplinary discharge planning process, led by the attending physician.  Recommendations may be updated based on patient status, additional functional criteria and insurance authorization.    Follow Up Recommendations  Skilled nursing-short term rehab (<3 hours/day)    Assistance Recommended at Discharge Set up Supervision/Assistance  Patient can return home with the following  A lot of help with walking and/or transfers;A lot of help with bathing/dressing/bathroom;Assist for transportation   Equipment Recommendations  BSC/3in1    Recommendations for Other Services      Precautions / Restrictions Precautions Precautions: Fall Precaution Comments: drain R side, [place] 3L       Mobility Bed Mobility Overal bed mobility: Needs Assistance Bed Mobility: Rolling, Sidelying to Sit Rolling: Max assist Sidelying to sit: Max assist       General bed mobility comments: Rolled R to come off of bedpan adn for better bed pad placement; tried to perform bridging to unroll the rest of the bed pad -- incr time to explain and cue for pushing feet into bed and extending hips for rise, but eventually able to bridge with heavy assist to stabilize feet on bed; max  assist to elevate trunk to sit after helping feet clear EOB    Transfers Overall transfer level: Needs assistance Equipment used: Ambulation equipment used [employer]) Transfers: Sit to/from Stand Sit to Stand: Max assist, +2 physical assistance, +2 safety/equipment           General transfer comment: tends to drift into posterior lean and pattern of trunk extension; Multimodal cues to flex at hips, and initiate sit to stand with forward weight shift; incr time to prep to stand as pt's feet tended to push forward on stedy platform, requiring cues and assist for correction; Max assist to rise; noted audible crepitus R knee; difficulty organizing trunk and weight on LEs and bottom in the high seat of the stedy, but able to stabilize enough to roll to the recliner     Balance Overall balance assessment: Needs assistance Sitting-balance support: Bilateral upper extremity supported, Feet supported Sitting balance-Leahy Scale: Poor     Standing balance support: Bilateral upper extremity supported, During functional activity, Reliant on assistive device for balance Standing balance-Leahy Scale: Zero                             ADL either performed or assessed with clinical judgement   ADL Overall ADL's : Needs assistance/impaired                           Toilet Transfer Details (indicate cue type and reason): progressed oob to chair to simulated           General ADL Comments: pt on bed pan on arrival and not voiding  at this time.    Extremity/Trunk Assessment Upper Extremity Assessment Upper Extremity Assessment: Generalized weakness RUE Deficits / Details: does not sustain shoulder flexion this session with cues. question cognition a factor   Lower Extremity Assessment Lower Extremity Assessment: Defer to PT evaluation        Vision       Perception     Praxis      Cognition Arousal/Alertness: Awake/alert Behavior During Therapy: WFL for tasks  assessed/performed Overall Cognitive Status: No family/caregiver present to determine baseline cognitive functioning                                          Exercises      Shoulder Instructions       General Comments 2L [place] pt reports dizziness with BP supine 129/81 (97) , sitting 104/70(81) sitting 95/65 and end of session 109/76 (87) with SCD applied and legs elevated.     Pertinent Vitals/ Pain       Pain Assessment Pain Assessment: Faces Faces Pain Scale: Hurts little more Pain Location: R knee with OA, noted audible crepitus Pain Descriptors / Indicators: Discomfort, Grimacing Pain Intervention(s): Repositioned, Monitored during session  Home Living                                          Prior Functioning/Environment              Frequency  Min 2X/week        Progress Toward Goals  OT Goals(current goals can now be found in the care plan section)  Progress towards OT goals: Progressing toward goals  Acute Rehab OT Goals Patient Stated Goal: to go to rehab tomorrow from hospital OT Goal Formulation: With patient Time For Goal Achievement: 06/18/22 Potential to Achieve Goals: Good ADL Goals Pt Will Perform Grooming: with set-up;with adaptive equipment;sitting Additional ADL Goal #1: pt will complete bed mobility mod (A) as precursor to adls. Additional ADL Goal #2: pt will complete basic transfer total +2 mod (A) as precursor to adls  Plan Discharge plan remains appropriate    Co-evaluation    PT/OT/SLP Co-Evaluation/Treatment: Yes Reason for Co-Treatment: Complexity of the patient's impairments (multi-system involvement);Necessary to address cognition/behavior during functional activity;For patient/therapist safety;To address functional/ADL transfers PT goals addressed during session: Mobility/safety with mobility OT goals addressed during session: ADL's and self-care;Proper use of Adaptive equipment and  DME;Strengthening/ROM      AM-PAC OT "6 Clicks" Daily Activity     Outcome Measure   Help from another person eating meals?: A Little Help from another person taking care of personal grooming?: A Little Help from another person toileting, which includes using toliet, bedpan, or urinal?: A Lot Help from another person bathing (including washing, rinsing, drying)?: A Lot Help from another person to put on and taking off regular upper body clothing?: A Lot Help from another person to put on and taking off regular lower body clothing?: A Lot 6 Click Score: 14    End of Session Equipment Utilized During Treatment: Oxygen  OT Visit Diagnosis: Unsteadiness on feet (R26.81);Muscle weakness (generalized) (M62.81)   Activity Tolerance Patient tolerated treatment well   Patient Left in chair;with call bell/phone within reach;with chair alarm set;Other (comment) (lift padplaced)   Nurse Communication Mobility status;Need for lift  equipment;Precautions        Time: 8003-4917 OT Time Calculation (min): 35 min  Charges: OT General Charges $OT Visit: 1 Visit OT Treatments $Self Care/Home Management : 8-22 mins   Brynn, OTR/L  Acute Rehabilitation Services Office: [phone] .   Mateo Flow 06/05/2022, 2:04 PM

## 2022-06-05 NOTE — TOC Initial Note (Signed)
Transition of Care [facility]) - Initial/Assessment Note    Patient Details  Name: Sean Ward MRN: [id number] Date of Birth: 1941/10/13  Transition of [facility]) CM/SW Contact:    Coralee Pesa, Ignacio Phone Number: 06/05/2022, 5:04 PM  Clinical Narrative:                 CSW met with pt at bedside to discuss DC plans. Pt states he is from LTC at [place] and is agreeable to going back. He declines CSW updating any of his emergency contacts. CSW spoke with [name] that confirmed they can take pt back when ready. Pt will need a new authorization prior to discharge. TOC will continue to follow for DC needs.  Expected Discharge Plan: [place] Barriers to Discharge: Continued Medical Work up, Ship broker   Patient Goals and CMS Choice Patient states their goals for this hospitalization and ongoing recovery are:: Pt states he would like to be able to return to [place]. CMS Medicare.gov Compare Post Acute Care list provided to:: Patient Choice offered to / list presented to : Patient  Expected Discharge Plan and Services Expected Discharge Plan: [place] Choice: [place] Living arrangements for the past 2 months: [place]                                      Prior Living Arrangements/Services Living arrangements for the past 2 months: [place] Lives with:: Facility Resident Patient language and need for interpreter reviewed:: Yes Do you feel safe going back to the place where you live?: Yes      Need for Family Participation in Patient Care: No (Comment) Care giver support system in place?: Yes (comment)   Criminal Activity/Legal Involvement Pertinent to Current Situation/Hospitalization: No - Comment as needed  Activities of Daily Living      Permission Sought/Granted Permission sought to share information with : Family Supports Permission granted to  share information with : No              Emotional Assessment Appearance:: Appears stated age Attitude/Demeanor/Rapport: Engaged Affect (typically observed): Appropriate Orientation: : Oriented to Self, Oriented to Place, Oriented to  Time, Oriented to Situation Alcohol / Substance Use: Not Applicable Psych Involvement: No (comment)  Admission diagnosis:  Acute cholecystitis [K81.0] Cholecystitis [K81.9] Sepsis ([place]) [A41.9] Patient Active Problem List   Diagnosis Date Noted   Acute cholecystitis 06/02/2022   Sepsis ([place]) 06/02/2022   Pressure injury of skin 04/01/2022   Rhabdomyolysis 03/30/2022   Dehydration 03/30/2022   Psoriasis 11/13/2021   Dermatochalasis of both upper eyelids 08/04/2021   Neck pain 05/10/2021   Other idiopathic scoliosis, 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, [place] [address] Phone: [phone] Fax: [phone]     Social Determinants of Health (SDOH) Interventions    Readmission Risk Interventions     No data to display

## 2022-06-06 DIAGNOSIS — K810 Acute cholecystitis: Secondary | ICD-10-CM | POA: Diagnosis not present

## 2022-06-06 LAB — COMPREHENSIVE METABOLIC PANEL
ALT: 5 U/L (ref 0–44)
AST: 15 U/L (ref 15–41)
Albumin: 2 g/dL — ABNORMAL LOW (ref 3.5–5.0)
Alkaline Phosphatase: 73 U/L (ref 38–126)
Anion gap: 7 (ref 5–15)
BUN: 12 mg/dL (ref 8–23)
CO2: 21 mmol/L — ABNORMAL LOW (ref 22–32)
Calcium: 7.9 mg/dL — ABNORMAL LOW (ref 8.9–10.3)
Chloride: 108 mmol/L (ref 98–111)
Creatinine, Ser: 0.92 mg/dL (ref 0.61–1.24)
GFR, Estimated: >60 mL/min (ref >60)
Glucose, Bld: 85 mg/dL (ref 70–99)
Potassium: 3.9 mmol/L (ref 3.5–5.1)
Sodium: 136 mmol/L (ref 135–145)
Total Bilirubin: 1.4 mg/dL — ABNORMAL HIGH (ref 0.3–1.2)
Total Protein: 4.9 g/dL — ABNORMAL LOW (ref 6.5–8.1)

## 2022-06-06 LAB — CBC WITH DIFFERENTIAL/PLATELET
Abs Immature Granulocytes: 0.16 10*3/uL — ABNORMAL HIGH (ref 0.00–0.07)
Basophils Absolute: 0 10*3/uL (ref 0.0–0.1)
Basophils Relative: 1 %
Eosinophils Absolute: 0.1 10*3/uL (ref 0.0–0.5)
Eosinophils Relative: 3 %
HCT: 36.6 % — ABNORMAL LOW (ref 39.0–52.0)
Hemoglobin: 12.7 g/dL — ABNORMAL LOW (ref 13.0–17.0)
Immature Granulocytes: 3 %
Lymphocytes Relative: 16 %
Lymphs Abs: 0.9 10*3/uL (ref 0.7–4.0)
MCH: 31.7 pg (ref 26.0–34.0)
MCHC: 34.7 g/dL (ref 30.0–36.0)
MCV: 91.3 fL (ref 80.0–100.0)
Monocytes Absolute: 0.6 10*3/uL (ref 0.1–1.0)
Monocytes Relative: 12 %
Neutro Abs: 3.6 10*3/uL (ref 1.7–7.7)
Neutrophils Relative %: 65 %
Platelets: 236 10*3/uL (ref 150–400)
RBC: 4.01 MIL/uL — ABNORMAL LOW (ref 4.22–5.81)
RDW: 14.7 % (ref 11.5–15.5)
WBC: 5.4 10*3/uL (ref 4.0–10.5)
nRBC: 0 % (ref 0.0–0.2)

## 2022-06-06 LAB — MAGNESIUM: Magnesium: 2.2 mg/dL (ref 1.7–2.4)

## 2022-06-06 LAB — BRAIN NATRIURETIC PEPTIDE: B Natriuretic Peptide: 631.5 pg/mL — ABNORMAL HIGH (ref 0.0–100.0)

## 2022-06-06 MED ORDER — BISACODYL 5 MG PO TBEC: 10.0000 mg | DELAYED_RELEASE_TABLET | Freq: Every day | ORAL | Status: DC | PRN

## 2022-06-06 MED ORDER — DOCUSATE SODIUM 100 MG PO CAPS: 200.0000 mg | ORAL_CAPSULE | Freq: Two times a day (BID) | ORAL | 0 refills | Status: AC | PRN

## 2022-06-06 MED ORDER — ORAL CARE MOUTH RINSE: 15.0000 mL | OROMUCOSAL | Status: DC | PRN

## 2022-06-06 MED ORDER — POTASSIUM CHLORIDE CRYS ER 20 MEQ PO TBCR
20.0000 meq | EXTENDED_RELEASE_TABLET | Freq: Once | ORAL | Status: AC
  Administered 2022-06-06: 20 meq via ORAL
  Filled 2022-06-06: qty 1

## 2022-06-06 MED ORDER — AMOXICILLIN-POT CLAVULANATE 500-125 MG PO TABS: 1.0000 | ORAL_TABLET | Freq: Three times a day (TID) | ORAL | Status: DC

## 2022-06-06 MED ORDER — BISACODYL 5 MG PO TBEC: 10.0000 mg | DELAYED_RELEASE_TABLET | Freq: Every day | ORAL | 0 refills | Status: AC | PRN

## 2022-06-06 MED ORDER — FUROSEMIDE 10 MG/ML IJ SOLN
20.0000 mg | Freq: Once | INTRAMUSCULAR | Status: AC
  Administered 2022-06-06: 20 mg via INTRAVENOUS
  Filled 2022-06-06: qty 2

## 2022-06-06 MED ORDER — DOCUSATE SODIUM 100 MG PO CAPS
200.0000 mg | ORAL_CAPSULE | Freq: Two times a day (BID) | ORAL | Status: DC
  Administered 2022-06-06: 200 mg via ORAL
  Filled 2022-06-06: qty 2

## 2022-06-06 MED ORDER — PANTOPRAZOLE SODIUM 40 MG PO TBEC: 40.0000 mg | DELAYED_RELEASE_TABLET | Freq: Every day | ORAL | 0 refills | Status: AC
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
## 2022-06-06 NOTE — Discharge Instructions (Signed)
Drain care per IR - flush drain QD with 5 cc NS, record output QD, dressing changes every 2-3 days or earlier if soiled.   Follow with Primary MD [name], MD in 7 days   Get CBC, CMP, 2 view Chest X ray -  checked next visit within 1 week by Primary MD    Activity: As tolerated with Full fall precautions use walker/cane & assistance as needed  Disposition SNF  Diet: Heart Healthy with feeding assistance and aspiration precautions.  Special Instructions: If you have smoked or chewed Tobacco  in the last 2 yrs please stop smoking, stop any regular Alcohol  and or any Recreational drug use.  On your next visit with your primary care physician please Get Medicines reviewed and adjusted.  Please request your Prim.MD to go over all Hospital Tests and Procedure/Radiological results at the follow up, please get all Hospital records sent to your Prim MD by signing hospital release before you go home.  If you experience worsening of your admission symptoms, develop shortness of breath, life threatening emergency, suicidal or homicidal thoughts you must seek medical attention immediately by calling 911 or calling your MD immediately  if symptoms less severe.  You Must read complete instructions/literature along with all the possible adverse reactions/side effects for all the Medicines you take and that have been prescribed to you. Take any new Medicines after you have completely understood and accpet all the possible adverse reactions/side effects.

## 2022-06-06 NOTE — NC FL2 (Signed)
[place] MEDICAID FL2 LEVEL OF CARE SCREENING TOOL     IDENTIFICATION  Patient Name: Sean Ward Birthdate: 18-Sep-1941 Sex: male Admission Date (Current Location): 06/02/2022  [facility] and [place] Number:  Producer, television/film/video and Address:  [facility] [place], [address], [place], [place] [address]      Provider Number: [id number]  Attending Physician Name and Address:  Leroy Sea, MD  Relative Name and Phone Number:  Zadiel, Leyh   [phone]    Current Level of Care: Hospital Recommended Level of Care: Skilled Nursing Facility Prior Approval Number:    Date Approved/Denied:   PASRR Number: [id number]  Discharge Plan: SNF    Current Diagnoses: Patient Active Problem List   Diagnosis Date Noted   Acute cholecystitis 06/02/2022   Sepsis (HCC) 06/02/2022   Pressure injury of skin 04/01/2022   Rhabdomyolysis 03/30/2022   Dehydration 03/30/2022   Psoriasis 11/13/2021   Dermatochalasis of both upper eyelids 08/04/2021   Neck pain 05/10/2021   Other idiopathic scoliosis, 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   Lumbar back pain 05/10/2021   Parkinson disease (HCC) 02/28/2018   Primary osteoarthritis of right knee 08/16/2016   Dyslipidemia 08/16/2016   Unsteady gait 05/24/2016   Atrial fibrillation (HCC) 10/27/2014   Onychomycosis 05/03/2014   Tremor 04/08/2014   Venous stasis 03/25/2014    Orientation RESPIRATION BLADDER Height & Weight     Self, Time, Situation, Place  O2 Incontinent, External catheter Weight:   Height:     BEHAVIORAL SYMPTOMS/MOOD NEUROLOGICAL BOWEL NUTRITION STATUS      Continent Diet (see discharge summary)  AMBULATORY STATUS COMMUNICATION OF NEEDS Skin   Total Care Verbally Other (Comment) (ecchymosis)                       Personal Care Assistance Level of Assistance  Bathing, Feeding, Dressing Bathing Assistance: Maximum assistance Feeding assistance: Limited assistance Dressing Assistance: Maximum  assistance     Functional Limitations Info  Sight, Hearing, Speech Sight Info: Adequate Hearing Info: Adequate Speech Info: Adequate    SPECIAL CARE FACTORS FREQUENCY  PT (By licensed PT), OT (By licensed OT)     PT Frequency: 5x week OT Frequency: 5x week            Contractures Contractures Info: Not present    Additional Factors Info  Code Status, Allergies Code Status Info: full Allergies Info: NKA           Current Medications (06/06/2022):  This is the current hospital active medication list Current Facility-Administered Medications  Medication Dose Route Frequency Provider Last Rate Last Admin   apixaban (ELIQUIS) tablet 5 mg  5 mg Oral BID Jardin, Carla G, RPH   5 mg at 06/05/22 2221   bisacodyl (DULCOLAX) EC tablet 10 mg  10 mg Oral Daily PRN Leroy Sea, MD       carbidopa-levodopa (SINEMET CR) 50-200 MG per tablet controlled release 1 tablet  1 tablet Oral QHS Eduard Clos, MD   1 tablet at 06/05/22 2221   carbidopa-levodopa (SINEMET IR) 25-100 MG per tablet immediate release 1.5 tablet  1.5 tablet Oral 5 X Daily Eduard Clos, MD   1.5 tablet at 06/05/22 2218   docusate sodium (COLACE) capsule 200 mg  200 mg Oral BID Leroy Sea, MD       furosemide (LASIX) injection 20 mg  20 mg Intravenous Once Leroy Sea, MD       midodrine (  PROAMATINE) tablet 5 mg  5 mg Oral TID WC Leroy Sea, MD   5 mg at 06/05/22 1710   Oral care mouth rinse  15 mL Mouth Rinse PRN Leroy Sea, MD       pantoprazole (PROTONIX) EC tablet 40 mg  40 mg Oral Daily Leroy Sea, MD   40 mg at 06/05/22 0832   piperacillin-tazobactam (ZOSYN) IVPB 3.375 g  3.375 g Intravenous Q8H Eduard Clos, MD 12.5 mL/hr at 06/06/22 0224 3.375 g at 06/06/22 0224   potassium chloride SA (KLOR-CON M) CR tablet 20 mEq  20 mEq Oral Once Leroy Sea, MD       propranolol ER (INDERAL LA) 24 hr capsule 60 mg  60 mg Oral QHS Leroy Sea, MD   60 mg  at 06/05/22 2220   sodium chloride flush (NS) 0.9 % injection 5 mL  5 mL Intracatheter Q8H Irish Lack, MD   5 mL at 06/06/22 0532     Discharge Medications: Please see discharge summary for a list of discharge medications.  Relevant Imaging Results:  Relevant Lab Results:   Additional Information SSN# [id number]  Lorri Frederick, LCSW

## 2022-06-06 NOTE — TOC Progression Note (Addendum)
Transition of Care [facility]) - Progression Note    Patient Details  Name: Sean Ward MRN: [id number] Date of Birth: 1940/11/12  Transition of Care [facility]) CM/SW Contact  Lorri Frederick, LCSW Phone Number: 06/06/2022, 8:34 AM  Clinical Narrative:   [place] Cao completed, sent to [place].  Auth request submitted in [place].  1140: auth approved in [place]: [id number], [id number], 3 days: 8/16-8/18.    Expected Discharge Plan: Skilled Nursing Facility Barriers to Discharge: Continued Medical Work up, English as a second language teacher  Expected Discharge Plan and Services Expected Discharge Plan: Skilled Nursing Facility     Post Acute Care Choice: Skilled Nursing Facility Living arrangements for the past 2 months: Skilled Nursing Facility                                       Social Determinants of Health (SDOH) Interventions    Readmission Risk Interventions     No data to display

## 2022-06-06 NOTE — Plan of Care (Signed)

## 2022-06-06 NOTE — Progress Notes (Signed)
Pt discharged with all patient belongings and AVS summary. Patient not in acute distress. Called [facility] twice and was sent to voicemail.

## 2022-06-06 NOTE — TOC Transition Note (Signed)
Transition of Care [facility] - CM/SW Discharge Note   Patient Details  Name: Sean Ward MRN: [id number] Date of Birth: July 30, 1941  [facility] CM/SW Contact:  Lorri Frederick, LCSW Phone Number: 06/06/2022, 11:56 AM   Clinical Narrative:   Pt discharging to [facility].  RN call [phone] for report.     Final next level of care: Skilled Nursing Facility Barriers to Discharge: Barriers Resolved   Patient Goals and CMS Choice Patient states their goals for this hospitalization and ongoing recovery are:: Pt states he would like to be able to return to [place]. CMS Medicare.gov Compare Post Acute Care list provided to:: Patient Choice offered to / list presented to : Patient  Discharge Placement              Patient [facility] at:  [facility]) Patient to be transferred to facility by: [facility] Name of family member notified: Francis Gaines Patient and family notified of of transfer: 06/06/22  Discharge Plan and Services     Post Acute Care Choice: Skilled Nursing Facility                               Social Determinants of Health (SDOH) Interventions     Readmission Risk Interventions     No data to display

## 2022-06-07 ENCOUNTER — Encounter: Payer: Self-pay | Admitting: Neurology

## 2022-06-07 ENCOUNTER — Ambulatory Visit: Payer: Medicare Other | Admitting: Neurology

## 2022-06-07 LAB — CULTURE, BLOOD (SINGLE)
Culture: NO GROWTH
Special Requests: ADEQUATE

## 2022-06-07 LAB — AEROBIC/ANAEROBIC CULTURE W GRAM STAIN (SURGICAL/DEEP WOUND)
Culture: NO GROWTH
Gram Stain: NONE SEEN
Special Requests: NORMAL

## 2022-06-14 ENCOUNTER — Telehealth: Payer: Self-pay | Admitting: Neurology

## 2022-06-14 NOTE — Telephone Encounter (Signed)
Pt said Parkinson's getting worse. Would like a call from the nurse to discuss a sooner appt.  Pt was not speaking clearly. Pt said he was in a rehab facility.

## 2022-06-14 NOTE — Telephone Encounter (Signed)
I called pt and he wanted appt sooner for worsening PD.  Was seen in cholecystisis 06-02-22 was seen in ED.  I made earlier appt [phone] at 0845.  He resides at [facility].

## 2022-06-14 NOTE — Telephone Encounter (Signed)
Placed on waitlist

## 2022-06-15 ENCOUNTER — Other Ambulatory Visit: Payer: Self-pay | Admitting: Orthopaedic Surgery

## 2022-06-15 DIAGNOSIS — K819 Cholecystitis, unspecified: Secondary | ICD-10-CM

## 2022-06-19 ENCOUNTER — Ambulatory Visit: Payer: Medicare Other | Admitting: Orthopaedic Surgery

## 2022-06-19 NOTE — Progress Notes (Signed)
Office Visit    Patient Name: Sean Ward Date of Encounter: 06/19/2022  PCP:  [name], MD   [facility]  Cardiologist:  Lance Muss, MD  Advanced Practice Provider:  No care team member to display Electrophysiologist:  None   HPI    Sean Ward is a 81 y.o. male with a past medical history significant for atrial fibrillation (diagnosed January 2016), Parkinson's disease, back pain with radiation, long-term anticoagulant use presents today for hospital follow-up.  He was last seen in the office 02/14/2022.  When he was initially diagnosed with atrial fibrillation heart rate was 115 to 120 bpm.  CHA2DS2-VASc score was only 1 therefore he does not started on any anticoagulation.  He was started on a full dose aspirin.  He was treated with Lasix for edema but this did not seem to help.  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  Compression stockings have helped.  Patient has Parkinson's disease and is benefiting from medication.  Aspirin was eventually stopped and Eliquis was started.  He does have mild left leg edema chronically.  Knee problems had limited his walking in the past.  He was recently seen in the ER for 1 week of nausea and vomiting.  He was diagnosed with acute cholecystitis with sepsis.  He underwent a cholecystostomy drain.  Discharged 06/02/2022.     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 No  evidence of significant right lower extremity arterial disease.   Left: Resting left ankle-brachial index is within normal range. No  evidence of significant left lower extremity arterial disease.    *See table(s) above for measurements and observations.   EKG:  EKG is  ordered today.  The ekg ordered today demonstrates   Recent Labs: 02/14/2022: TSH 3.280 06/06/2022: ALT 5; B Natriuretic Peptide 631.5; BUN 12; Creatinine, Ser 0.92; Hemoglobin 12.7; Magnesium 2.2; Platelets 236; Potassium 3.9; Sodium 136  Recent Lipid Panel    Component Value Date/Time   CHOL 165 06/01/2020 1529   TRIG 91 06/01/2020 1529   HDL 51 06/01/2020 1529   CHOLHDL 3.2 06/01/2020 1529   CHOLHDL 3.5 08/16/2016 1038   VLDL 24 08/16/2016 1038   LDLCALC 97 06/01/2020 1529   Home Medications   No outpatient medications have been marked as taking for the 06/20/22 encounter (Appointment) with Sharlene Dory, PA-C.     Review of Systems      All other systems reviewed and are otherwise negative except as noted above.  Physical Exam    VS:  There were no vitals taken for this visit. , BMI There is no height or weight on file to  calculate BMI.  Wt Readings from Last 3 Encounters:  05/21/22 160 lb (72.6 kg)  05/02/22 163 lb 9.3 oz (74.2 kg)  04/17/22 163 lb 9.3 oz (74.2 kg)      Assessment & Plan    PAF Lower extremity edema Fall risk/Parkinson's/knee problems Elevated blood glucose      Disposition: Follow up  with Lance Muss, MD or APP.  Signed, Sharlene Dory, PA-C 06/19/2022, 9:30 PM [facility] This encounter was created in error - please disregard.

## 2022-06-20 ENCOUNTER — Encounter: Payer: Medicare Other | Admitting: Physician Assistant

## 2022-06-20 ENCOUNTER — Ambulatory Visit: Payer: Medicare Other | Admitting: Orthopaedic Surgery

## 2022-06-20 DIAGNOSIS — R600 Localized edema: Secondary | ICD-10-CM

## 2022-06-20 DIAGNOSIS — I4819 Other persistent atrial fibrillation: Secondary | ICD-10-CM

## 2022-06-20 DIAGNOSIS — Z9181 History of falling: Secondary | ICD-10-CM

## 2022-06-20 DIAGNOSIS — R7309 Other abnormal glucose: Secondary | ICD-10-CM

## 2022-06-21 ENCOUNTER — Ambulatory Visit: Payer: Medicare Other | Admitting: Neurology

## 2022-06-26 ENCOUNTER — Ambulatory Visit
Admission: RE | Admit: 2022-06-26 | Discharge: 2022-06-26 | Disposition: A | Discharge status: Home / Self Care | Payer: Medicare Other | Source: Ambulatory Visit | Attending: Orthopaedic Surgery | Admitting: Orthopaedic Surgery

## 2022-06-26 ENCOUNTER — Encounter: Payer: Self-pay | Admitting: Radiology

## 2022-06-26 DIAGNOSIS — K819 Cholecystitis, unspecified: Secondary | ICD-10-CM

## 2022-06-26 HISTORY — PX: IR RADIOLOGIST EVAL & MGMT: IMG5224

## 2022-06-26 NOTE — Progress Notes (Signed)
Referring Physician(s): Blackman,Christopher Y  Chief Complaint: The patient is seen in follow up today s/p cholecystomy tube placement on 8.12.23 by Dr. Fredia Sorrow  History of present illness:  outpatient. History of Parkinson's disease, a fib (on eliquisi) scholiosis. Presented to the ED at [facility] with abdominal pain, nausea, vomiting, leukocytosis .CT Abd pelvis shows evidence of acute cholecystitis.  Patient was deemed not be a surgical candidate given anticoagulation with Eliquis and comorbidities. Patient condition worsened in the ED and the Patient found to be obtunded with rising lactate. IR Placed a cholecystomy tube on 8.12.23.  Cultures showed no growth.  Patient is being followed by [facility] and scheduled to follow up with them on 10.18.23.   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  Medications: Prior to Admission medications   Medication Sig Start Date End Date Taking? Authorizing Provider  amoxicillin-clavulanate (AUGMENTIN) 500-125 MG tablet Take 1 tablet (500 mg total) by mouth 3 (three) times daily. 06/06/22   Leroy Sea, MD  apixaban (ELIQUIS) 5 MG TABS tablet TAKE 1 TABLET (5 MG TOTAL) BY MOUTH 2 (TWO) TIMES DAILY. Patient taking differently: Take 5 mg by mouth 2 (two) times daily with a meal. Sean/26/23 Sean/25/24  [name], MD  bisacodyl (DULCOLAX) 5 MG EC tablet Take 2 tablets (10 mg total) by mouth daily as  needed for moderate constipation. 06/06/22   Leroy Sea, MD  carbidopa-levodopa (SINEMET CR) 50-200 MG tablet TAKE 1 TABLET BY MOUTH AT BEDTIME. Sean/26/23   [name], MD  carbidopa-levodopa (SINEMET IR) 25-100 MG tablet TAKE 1 & 1/2 TABLETS BY  MOUTH 5 TIMES A DAY, AT 8 AM, 11 AM, 2 PM, 5 PM AND 8 PM. Patient taking differently: Take 1.5 tablets by mouth See admin instructions. Take 1 1/2 tablet by mouth 5 times daily - 8am, 11am, 2pm, 5pm and 8pm Sean/26/23 Sean/25/24  [name], MD  docusate sodium (COLACE) 100 MG capsule Take 2 capsules (200 mg total) by mouth 2 (two) times daily as needed for mild constipation. 06/06/22   Leroy Sea, MD  melatonin 5 MG TABS Take 5 mg by mouth at bedtime.    [provider]  pantoprazole (PROTONIX) 40 MG tablet Take 1 tablet (40 mg total) by mouth daily. 06/07/22   Leroy Sea, MD  propranolol ER (INDERAL LA) 60 MG 24 hr capsule TAKE 1 CAPSULE (60 MG TOTAL) BY MOUTH DAILY. Patient taking differently: Take 60 mg by mouth at bedtime. Sean/26/23   [name], MD     Family History  Problem Relation Age of Onset   Deafness Mother    Deafness Sister    Heart disease Neg Hx     Social History   Socioeconomic History   Marital status: Legally Separated    Spouse name: Not on file   Number of  children: 2   Years of education: BA   Highest education level: Not on file  Occupational History   Not on file  Tobacco Use   Smoking status: Never   Smokeless tobacco: Never  Vaping Use   Vaping Use: Never used  Substance and Sexual Activity   Alcohol use: Yes    Alcohol/week: 3.0 standard drinks of alcohol    Types: 3 Glasses of wine per week    Comment: 1 glass of red wine with dinner some nights   Drug use: No   Sexual activity: Not Currently  Other Topics Concern   Not on file  Social History Narrative   Drinks 2 cups of coffee a day plus tea, 3 cup/day   Social Determinants of Health   Financial Resource  Strain: Not on file  Food Insecurity: Not on file  Transportation Needs: Unmet Transportation Needs (Sean/17/2023)   PRAPARE - Administrator, Civil Service (Medical): Yes    Lack of Transportation (Non-Medical): Yes  Physical Activity: Not on file  Stress: Not on file  Social Connections: Not on file     Vital Signs: There were no vitals taken for this visit.  Physical Exam Vitals and nursing note reviewed.  Constitutional:      Appearance: He is well-developed.  HENT:     Head: Normocephalic.  Pulmonary:     Effort: Pulmonary effort is normal.  Abdominal:     Comments: Positive RUQ drain to gravity bag. Site is unremarkable with no erythema, edema, tenderness, bleeding or drainage noted at exit site. Suture in place. Dressing is clean dry and intact. 100 ml of  dark brown colored fluid noted in the gravity bag. Drain is able to be flushed easily.    Musculoskeletal:        General: Normal range of motion.     Cervical back: Normal range of motion.  Skin:    General: Skin is dry.  Neurological:     Mental Status: He is alert and oriented to person, place, and time.     Imaging: No results found.  Labs:  CBC: Recent Labs    06/03/22 0155 06/04/22 0303 06/05/22 0750 06/06/22 0322  WBC 14.Sean* 6.0 5.5 5.4  HGB 14.2 11.3* 13.2 12.Sean*  HCT 41.1 33.0* 38.6* 36.6*  PLT 225 202 248 236    COAGS: Recent Labs    03/30/22 1425 06/02/22 1905 06/03/22 2128 06/04/22 0303 06/05/22 0750  INR 1.4* 2.Sean*  --   --   --   APTT  --  48* 98* 93* 138*    BMP: Recent Labs    06/03/22 0155 06/04/22 0303 06/05/22 0750 06/06/22 0322  NA 137 139 138 136  K 3.6 3.8 3.5 3.9  CL 105 112* 110 108  CO2 23 21* 22 21*  GLUCOSE 117* 128* 84 85  BUN 37* 26* 16 12  CALCIUM 8.3* Sean.8* 8.0* Sean.9*  CREATININE 1.12 0.96 0.86 0.92  GFRNONAA >60 >60 >60 >60    LIVER FUNCTION TESTS: Recent Labs    06/03/22 0155 06/04/22 0303 06/05/22 0750 06/06/22 0322  BILITOT 1.9*  1.6* 1.5* 1.4*  AST 21 14* 13* 15  ALT 6 5 6 5   ALKPHOS 87 62 73 73  PROT 5.1* 4.5* 5.0* 4.9*  ALBUMIN 2.0* 1.Sean* 2.0* 2.0*    Assessment:  81 y.o. Ward outpatient. History of Parkinson's disease, a fib (on eliquisi) scholiosis. Presented to the ED at [facility] with abdominal pain, nausea, vomiting, leukocytosis .  CT Abd pelvis shows evidence of acute cholecystitis.  Patient was deemed not be a surgical candidate given anticoagulation with Eliquis and comorbidities. Patient condition worsened in the ED and the Patient found to be obtunded with rising lactate. IR Placed a cholecystomy tube on 8.12.23.  Cultures showed no growth. Patient is being followed by [facility] and scheduled to follow up with them on 10.18.23.   Patient presents to IR drain clinic for cholangiogram. He is currently resides at [facility]. He has completed his 14-day course of Augmentin. He endorses worsening Parkinson's symptoms. He denies any fevers, headache, SOB, cough, chest pain, nausea, vomiting or bleeding. Patient states that the facility is flushing the drain daily. 100  l's of output that is dark brown in nature noted to be in the gravity bag.  Site is unremarkable with no erythema, edema, tenderness, bleeding or drainage noted at exit site. Dressing is clean dry and intact.  Subsequent imaging including cholangiogram performed on 9.5.23 shows an open common bile duct and contrast noted in the duodenum. Case discussed with IR Attending Dr. Mosie Epstein. Due to the Patient's co-morbities and the amount of bile noted to be in the gravity bag the gallbladder's functionality appears to be transient at best. The decision was made not to cap the cholecystomy tube at this time.     PLAN: Schedule for 3 month cholangiogram with drain exchange in the hospital. Due patient's decrease mobiity recommend subsequent IR visits be performed in the hospital setting.    Please see attestation from Dr. Loreta Ave regarding findings and care plan.     Signed: Alene Mires, NP 06/26/2022, 1:28 PM   Please refer to Dr. Loreta Ave attestation of this note for management and plan.

## 2022-07-05 ENCOUNTER — Emergency Department (HOSPITAL_COMMUNITY): Payer: Medicare Other

## 2022-07-05 ENCOUNTER — Observation Stay (HOSPITAL_COMMUNITY)
Admission: EM | Admit: 2022-07-05 | Discharge: 2022-07-07 | Disposition: A | Discharge status: Skilled Nursing Facility | Payer: Medicare Other | Attending: Family Medicine | Admitting: Family Medicine

## 2022-07-05 DIAGNOSIS — I700 Atherosclerosis of aorta: Secondary | ICD-10-CM | POA: Insufficient documentation

## 2022-07-05 DIAGNOSIS — K8020 Calculus of gallbladder without cholecystitis without obstruction: Secondary | ICD-10-CM | POA: Insufficient documentation

## 2022-07-05 DIAGNOSIS — T85598A Other mechanical complication of other gastrointestinal prosthetic devices, implants and grafts, initial encounter: Principal | ICD-10-CM | POA: Insufficient documentation

## 2022-07-05 DIAGNOSIS — Y732 Prosthetic and other implants, materials and accessory gastroenterology and urology devices associated with adverse incidents: Secondary | ICD-10-CM | POA: Insufficient documentation

## 2022-07-05 DIAGNOSIS — Z7901 Long term (current) use of anticoagulants: Secondary | ICD-10-CM | POA: Diagnosis not present

## 2022-07-05 DIAGNOSIS — N179 Acute kidney failure, unspecified: Secondary | ICD-10-CM | POA: Insufficient documentation

## 2022-07-05 DIAGNOSIS — N134 Hydroureter: Secondary | ICD-10-CM | POA: Insufficient documentation

## 2022-07-05 DIAGNOSIS — E440 Moderate protein-calorie malnutrition: Secondary | ICD-10-CM | POA: Insufficient documentation

## 2022-07-05 DIAGNOSIS — E872 Acidosis, unspecified: Secondary | ICD-10-CM | POA: Insufficient documentation

## 2022-07-05 DIAGNOSIS — K4090 Unilateral inguinal hernia, without obstruction or gangrene, not specified as recurrent: Secondary | ICD-10-CM | POA: Insufficient documentation

## 2022-07-05 DIAGNOSIS — Z20822 Contact with and (suspected) exposure to covid-19: Secondary | ICD-10-CM | POA: Insufficient documentation

## 2022-07-05 DIAGNOSIS — T85518A Breakdown (mechanical) of other gastrointestinal prosthetic devices, implants and grafts, initial encounter: Secondary | ICD-10-CM | POA: Diagnosis not present

## 2022-07-05 DIAGNOSIS — R748 Abnormal levels of other serum enzymes: Secondary | ICD-10-CM | POA: Diagnosis not present

## 2022-07-05 DIAGNOSIS — K810 Acute cholecystitis: Secondary | ICD-10-CM

## 2022-07-05 DIAGNOSIS — G20 Parkinson's disease: Secondary | ICD-10-CM | POA: Insufficient documentation

## 2022-07-05 DIAGNOSIS — I480 Paroxysmal atrial fibrillation: Secondary | ICD-10-CM | POA: Insufficient documentation

## 2022-07-05 DIAGNOSIS — N400 Enlarged prostate without lower urinary tract symptoms: Secondary | ICD-10-CM | POA: Diagnosis not present

## 2022-07-05 DIAGNOSIS — I4891 Unspecified atrial fibrillation: Secondary | ICD-10-CM | POA: Diagnosis present

## 2022-07-05 DIAGNOSIS — E860 Dehydration: Secondary | ICD-10-CM | POA: Insufficient documentation

## 2022-07-05 DIAGNOSIS — Z79899 Other long term (current) drug therapy: Secondary | ICD-10-CM | POA: Insufficient documentation

## 2022-07-05 LAB — RESP PANEL BY RT-PCR (FLU A&B, COVID) ARPGX2
Influenza A by PCR: NEGATIVE
Influenza B by PCR: NEGATIVE
SARS Coronavirus 2 by RT PCR: NEGATIVE

## 2022-07-05 LAB — COMPREHENSIVE METABOLIC PANEL
ALT: <5 U/L (ref 0–44)
AST: 11 U/L — ABNORMAL LOW (ref 15–41)
Albumin: 2.8 g/dL — ABNORMAL LOW (ref 3.5–5.0)
Alkaline Phosphatase: 108 U/L (ref 38–126)
Anion gap: 14 (ref 5–15)
BUN: 22 mg/dL (ref 8–23)
CO2: 23 mmol/L (ref 22–32)
Calcium: 8.9 mg/dL (ref 8.9–10.3)
Chloride: 101 mmol/L (ref 98–111)
Creatinine, Ser: 1.71 mg/dL — ABNORMAL HIGH (ref 0.61–1.24)
GFR, Estimated: 40 mL/min — ABNORMAL LOW (ref >60)
Glucose, Bld: 92 mg/dL (ref 70–99)
Potassium: 5.5 mmol/L — ABNORMAL HIGH (ref 3.5–5.1)
Sodium: 138 mmol/L (ref 135–145)
Total Bilirubin: 1.3 mg/dL — ABNORMAL HIGH (ref 0.3–1.2)
Total Protein: 6.4 g/dL — ABNORMAL LOW (ref 6.5–8.1)

## 2022-07-05 LAB — URINALYSIS, ROUTINE W REFLEX MICROSCOPIC
Bilirubin Urine: NEGATIVE
Glucose, UA: NEGATIVE mg/dL
Hgb urine dipstick: NEGATIVE
Ketones, ur: 5 mg/dL — AB
Nitrite: NEGATIVE
Protein, ur: NEGATIVE mg/dL
Specific Gravity, Urine: 1.019 (ref 1.005–1.030)
pH: 5 (ref 5.0–8.0)

## 2022-07-05 LAB — CBC WITH DIFFERENTIAL/PLATELET
Abs Immature Granulocytes: 0.04 10*3/uL (ref 0.00–0.07)
Basophils Absolute: 0.1 10*3/uL (ref 0.0–0.1)
Basophils Relative: 1 %
Eosinophils Absolute: 0.1 10*3/uL (ref 0.0–0.5)
Eosinophils Relative: 2 %
HCT: 41.6 % (ref 39.0–52.0)
Hemoglobin: 14 g/dL (ref 13.0–17.0)
Immature Granulocytes: 1 %
Lymphocytes Relative: 12 %
Lymphs Abs: 1 10*3/uL (ref 0.7–4.0)
MCH: 31.8 pg (ref 26.0–34.0)
MCHC: 33.7 g/dL (ref 30.0–36.0)
MCV: 94.5 fL (ref 80.0–100.0)
Monocytes Absolute: 0.6 10*3/uL (ref 0.1–1.0)
Monocytes Relative: 7 %
Neutro Abs: 6.2 10*3/uL (ref 1.7–7.7)
Neutrophils Relative %: 77 %
Platelets: 314 10*3/uL (ref 150–400)
RBC: 4.4 MIL/uL (ref 4.22–5.81)
RDW: 14.1 % (ref 11.5–15.5)
WBC: 8 10*3/uL (ref 4.0–10.5)
nRBC: 0 % (ref 0.0–0.2)

## 2022-07-05 LAB — POC OCCULT BLOOD, ED: Fecal Occult Bld: NEGATIVE

## 2022-07-05 LAB — LACTIC ACID, PLASMA
Lactic Acid, Venous: 1.9 mmol/L (ref 0.5–1.9)
Lactic Acid, Venous: 4.3 mmol/L (ref 0.5–1.9)
Lactic Acid, Venous: 4.1 mmol/L (ref 0.5–1.9)

## 2022-07-05 LAB — PROTIME-INR
INR: 2 — ABNORMAL HIGH (ref 0.8–1.2)
Prothrombin Time: 22.1 seconds — ABNORMAL HIGH (ref 11.4–15.2)

## 2022-07-05 LAB — APTT: aPTT: 48 seconds — ABNORMAL HIGH (ref 24–36)

## 2022-07-05 LAB — POTASSIUM: Potassium: 5.1 mmol/L (ref 3.5–5.1)

## 2022-07-05 LAB — LIPASE, BLOOD: Lipase: 56 U/L — ABNORMAL HIGH (ref 11–51)

## 2022-07-05 MED ORDER — PIPERACILLIN-TAZOBACTAM 3.375 G IVPB: 3.3750 g | Freq: Once | INTRAVENOUS | Status: DC

## 2022-07-05 MED ORDER — LACTATED RINGERS IV BOLUS (SEPSIS)
1000.0000 mL | Freq: Once | INTRAVENOUS | Status: AC
  Administered 2022-07-05: 1000 mL via INTRAVENOUS

## 2022-07-05 MED ORDER — PIPERACILLIN-TAZOBACTAM 3.375 G IVPB 30 MIN
3.3750 g | Freq: Once | INTRAVENOUS | Status: AC
  Administered 2022-07-05: 3.375 g via INTRAVENOUS
  Filled 2022-07-05: qty 50

## 2022-07-05 MED ORDER — IOHEXOL 350 MG/ML SOLN
70.0000 mL | Freq: Once | INTRAVENOUS | Status: AC | PRN
  Administered 2022-07-05: 70 mL via INTRAVENOUS

## 2022-07-05 MED ORDER — LACTATED RINGERS IV SOLN: INTRAVENOUS | Status: DC

## 2022-07-05 MED ORDER — ACETAMINOPHEN 650 MG RE SUPP: 650.0000 mg | Freq: Four times a day (QID) | RECTAL | Status: DC | PRN

## 2022-07-05 MED ORDER — ENOXAPARIN SODIUM 40 MG/0.4ML IJ SOSY: 40.0000 mg | PREFILLED_SYRINGE | INTRAMUSCULAR | Status: DC

## 2022-07-05 MED ORDER — LACTATED RINGERS IV BOLUS
1000.0000 mL | Freq: Once | INTRAVENOUS | Status: AC
  Administered 2022-07-05: 1000 mL via INTRAVENOUS

## 2022-07-05 MED ORDER — ACETAMINOPHEN 325 MG PO TABS: 650.0000 mg | ORAL_TABLET | Freq: Four times a day (QID) | ORAL | Status: DC | PRN

## 2022-07-05 NOTE — Sepsis Progress Note (Signed)
eLink monitoring code sepsis.  

## 2022-07-05 NOTE — ED Triage Notes (Signed)
Came from [facility] . Yanked out the Drainage Tube for R knee placement accidentally w wheelchair, EMS said it seems like there is another piece inside of him. Pt is asymptomatic a hypotension. Cbg 120 Bp 88/64  Spo2 96%

## 2022-07-05 NOTE — H&P (Signed)
History and Physical    Patient: Sean Ward IDP:[id number] DOB: June 16, 1941 DOA: 07/05/2022 DOS: the patient was seen and examined on 07/06/2022 PCP: [name], MD  Patient coming from: SNF  Chief Complaint: No chief complaint on file.  HPI: Burley Kopka is a 81 y.o. male with medical history significant of atrial fibrillation on Eliquis, Parkinson's disease who presents emergency department via EMS from [facility] due to cholecystotomy tube dysfunction.  Patient states that while he was being transferred from the wheelchair to the bed at the facility, his cholecystotomy tube got stuck between right knee and wheelchair yanking of the drainage tube and caused a transitory pain.  EMS was activated, he was noted to be hypotensive and was taken to the ED for further evaluation and management.  He denies chest pain, fever, chills, shortness of breath, nausea or vomiting.   ED Course:  In the emergency department, he was hypotensive on arrival to the ED with a BP of 85/66, but other vital signs were within normal range.  Work-up in the ED showed normal CBC, and normal BMP except for K+ 5.5 > 5.1 and creatinine of 1.71 (baseline creatinine at 0.9-1.2).  Lactic acid 4.3 . 4.1 > 2.0.  Urinalysis was unimpressive for UTI.  Lipase 56.  Influenza A, B, SARS coronavirus 2 was negative. CT abdomen and pelvis with contrast showed: 1. Interval placement of percutaneous cholecystostomy tube within the decompressed bladder. Cholelithiasis. 2. Marked distention of the bladder with bilateral hydroureter. Query obstructive uropathy given enlarged prostate. 3. Large left inguinal hernia containing a loop of nondilated ileum 4.  Aortic Atherosclerosis  Patient was treated with IV Zosyn and IV hydration was provided.  Hospitalist was asked to admit patient for further evaluation and management.  Review of Systems: Review of systems as noted in the HPI. All other systems reviewed and are  negative.   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 He has never used smokeless tobacco. He reports current alcohol use of about 3.0 standard drinks of alcohol per week. He reports that he does not use drugs.   No Known Allergies  Family History  Problem Relation Age of Onset   Deafness Mother    Deafness Sister    Heart disease Neg Hx      Prior to Admission medications   Medication Sig Start Date End Date Taking? Authorizing Provider  amoxicillin-clavulanate (AUGMENTIN) 500-125 MG tablet Take 1 tablet (500 mg total) by mouth 3 (three) times daily. 06/06/22   Leroy Sea, MD  apixaban (ELIQUIS) 5 MG TABS tablet TAKE 1 TABLET (5 MG TOTAL) BY MOUTH 2 (TWO) TIMES DAILY. Patient taking differently: Take 5 mg by mouth 2 (two) times daily with a meal. 05/16/22 05/16/23  [name], MD  bisacodyl (DULCOLAX) 5 MG  EC tablet Take 2 tablets (10 mg total) by mouth daily as needed for moderate constipation. 06/06/22   Leroy Sea, MD  carbidopa-levodopa (SINEMET CR) 50-200 MG tablet TAKE 1 TABLET BY MOUTH AT BEDTIME. 05/16/22   [name], MD  carbidopa-levodopa (SINEMET IR) 25-100 MG tablet TAKE 1 & 1/2 TABLETS BY  MOUTH 5 TIMES A DAY, AT 8 AM, 11 AM, 2 PM, 5 PM AND 8 PM. Patient taking differently: Take 1.5 tablets by mouth See admin instructions. Take 1 1/2 tablet by mouth  5 times daily - 8am, 11am, 2pm, 5pm and 8pm 05/16/22 05/16/23  [name], MD  docusate sodium (COLACE) 100 MG capsule Take 2 capsules (200 mg total) by mouth 2 (two) times daily as needed for mild constipation. 06/06/22   Leroy Sea, MD  melatonin 5 MG TABS Take 5 mg by mouth at bedtime.    [provider]  pantoprazole (PROTONIX) 40 MG tablet Take 1 tablet (40 mg total) by mouth daily. 06/07/22   Leroy Sea, MD  propranolol ER (INDERAL LA) 60 MG 24 hr capsule TAKE 1 CAPSULE (60 MG TOTAL) BY MOUTH DAILY. Patient taking differently: Take 60 mg by mouth at bedtime. 05/16/22   [name], MD    Physical Exam: BP (!) 115/92   Pulse 86   Temp 97.9 F (36.6 C) (Oral)   Resp 11   Ht 5\' 9"  (1.753 m)   Wt 77.1 kg   SpO2 99%   BMI 25.10 kg/m   General: 81 y.o. year-old male ill appearing, but in no acute distress.  Alert and oriented x3. HEENT: NCAT, EOMI, Dry mucous membrane Neck: Supple, trachea medial Cardiovascular: Irregular rate and rhythm with no rubs or gallops.  No thyromegaly or JVD noted.  No lower extremity edema. 2/4 pulses in all 4 extremities. Respiratory: Clear to auscultation with no wheezes or rales.  Abdomen: Soft, nontender nondistended with normal bowel sounds x4 quadrants. Muskuloskeletal: Hand tremors noted. No cyanosis or clubbing noted bilaterally Neuro: CN II-XII intact, sensation, reflexes intact Skin: No ulcerative lesions noted or rashes Psychiatry: Judgement and insight appear normal. Mood is appropriate for condition and setting          Labs on Admission:  Basic Metabolic Panel: Recent Labs  Lab 07/05/22 1633 07/05/22 1855  NA 138  --   K 5.5* 5.1  CL 101  --   CO2 23  --   GLUCOSE 92  --   BUN 22  --   CREATININE 1.71*  --   CALCIUM 8.9  --    Liver Function Tests: Recent Labs  Lab 07/05/22 1633  AST 11*  ALT <5  ALKPHOS 108  BILITOT 1.3*  PROT 6.4*  ALBUMIN 2.8*   Recent Labs  Lab 07/05/22 1633   LIPASE 56*   No results for input(s): "AMMONIA" in the last 168 hours. CBC: Recent Labs  Lab 07/05/22 1633  WBC 8.0  NEUTROABS 6.2  HGB 14.0  HCT 41.6  MCV 94.5  PLT 314   Cardiac Enzymes: No results for input(s): "CKTOTAL", "CKMB", "CKMBINDEX", "TROPONINI" in the last 168 hours.  BNP (last 3 results) Recent Labs    06/04/22 0303 06/05/22 0750 06/06/22 0322  BNP 333.8* 498.6* 631.5*    ProBNP (last 3 results) No results for input(s): "PROBNP" in the last 8760 hours.  CBG: No results for input(s): "GLUCAP" in the last 168 hours.  Radiological Exams on Admission: CT ABDOMEN PELVIS W CONTRAST  Result Date: 07/05/2022 CLINICAL DATA:  Left lower quadrant abdominal pain EXAM: CT ABDOMEN AND PELVIS WITH CONTRAST TECHNIQUE: Multidetector CT imaging of the abdomen and pelvis was performed using the standard protocol following bolus administration of intravenous contrast. RADIATION DOSE REDUCTION: This exam was performed according to the departmental dose-optimization program which includes automated exposure control, adjustment of the mA and/or kV according to patient size and/or use of iterative reconstruction technique. CONTRAST:  8mL OMNIPAQUE IOHEXOL 350 MG/ML SOLN COMPARISON:  CT abdomen and pelvis 06/02/2022 FINDINGS: Lower chest: Biatrial enlargement.  No acute abnormality. Hepatobiliary: Cholecystostomy tube in the decompressed gallbladder. Cholelithiasis. No suspicious focal hepatic lesion. No biliary dilation. Pancreas: Unremarkable. Spleen: Unremarkable. Adrenals/Urinary Tract: Adrenal glands are unremarkable. Low-attenuation lesions in the kidneys are statistically likely to represent cysts. No follow-up is required. Distended bladder. Mild bilateral hydro ureter. No urinary calculi. Stomach/Bowel: Normal caliber large and small bowel. Normal stomach. Normal appendix. No bowel wall thickening. Moderate colonic stool load. Vascular/Lymphatic: Aortic atherosclerosis. No  enlarged abdominal or pelvic lymph nodes. enlargement of the prostate. Reproductive: Mild enlargement of the prostate. Other: Large left inguinal hernia containing a loop of nonobstructed ileum. No free intraperitoneal fluid or air. Musculoskeletal: No acute osseous abnormality. Right convex thoracolumbar curve. IMPRESSION: 1. Interval placement of percutaneous cholecystostomy tube within the decompressed bladder. Cholelithiasis. 2. Marked distention of the bladder with bilateral hydroureter. Query obstructive uropathy given enlarged prostate. 3. Large left inguinal hernia containing a loop of nondilated ileum 4.  Aortic Atherosclerosis (ICD10-I70.0). Electronically Signed   By: Minerva Fester M.D.   On: 07/05/2022 20:17   DG Chest Port 1 View  Result Date: 07/05/2022 CLINICAL DATA:  Possible sepsis EXAM: PORTABLE CHEST 1 VIEW COMPARISON:  03/30/2022 FINDINGS: Transverse diameter of heart is slightly increased. There are no signs of pulmonary edema or focal pulmonary consolidation. There is no pleural effusion or pneumothorax. A skin fold is noted overlying the right upper lung field and the superior mediastinum. IMPRESSION: No active disease. Electronically Signed   By: Ernie Avena M.D.   On: 07/05/2022 16:25    EKG: I independently viewed the EKG done and my findings are as followed: A-fib with rate controlled  Assessment/Plan Present on Admission:  Cholecystostomy tube dysfunction  Atrial fibrillation (HCC)  Dehydration  Parkinson disease (HCC)  Principal Problem:   Cholecystostomy tube dysfunction Active Problems:   Atrial fibrillation (HCC)   Parkinson disease (HCC)   Dehydration   Lactic acidosis   AKI (acute kidney injury) (HCC)   Elevated lipase  Cholecystotomy tube dysfunction Patient's cholecystectomy tube became dysfunctional after being accidentally yanked out when being transferred from wheelchair to bed IR was consulted for replacement/repair  Dehydration Acute  kidney injury creatinine of 1.71 (baseline creatinine at 0.9-1.2). Continue IV hydration Renally adjust medications, avoid nephrotoxic agents/dehydration/hypotension  Lactic acidosis Lactic acid 4.3 . 4.1 > 2.0 Continue IV hydration and continue to trend lactic acid  Hypoalbuminemia secondary to moderate protein calorie malnutrition Albumin 2.8, protein supplement to be provided  Hyperkalemia-resolved  K+ 5.5 > 5.1, continue to monitor potassium level with milligrams  Elevated lipase Lipase 56, patient denies abdominal pain at this time Continue to monitor and treat accordingly  Atrial fibrillation on Eliquis Continue Eliquis, propranolol  Parkinson disease Continue Sinemet  GERD Continue Protonix  DVT prophylaxis: Eliquis   Advance Care Planning:   Code Status: Full Code   Consults: IR  Family Communication: None at bedside  Severity of Illness: The appropriate patient status for this patient is OBSERVATION.  Observation status is judged to be reasonable and necessary in order to provide the required intensity of service to ensure the patient's safety. The patient's presenting symptoms, physical exam findings, and initial radiographic and laboratory data in the context of their medical condition is felt to place them at decreased risk for further clinical deterioration. Furthermore, it is anticipated that the patient will be medically stable for discharge from the hospital within 2 midnights of admission.   Author: Frankey Shown, DO 07/06/2022 1:51 AM  For on call review [email or web address].

## 2022-07-05 NOTE — ED Provider Notes (Signed)
[facility] EMERGENCY DEPARTMENT Provider Note   CSN: [id number] Arrival date & time: 07/05/22  1511     History {Add pertinent medical, surgical, social history, OB history to HPI:1} No chief complaint on file.   Sean Ward is a 81 y.o. male. Presenting from nursing facility due to cholecystostomy tube malfunction today during a transfer at facility.  Additionally patient reports constipation over the past week, for which she has been given laxatives with minimal outcome.  He also reports decreased p.o. intake over the past week.  Denies fever, dysuria, nausea, or vomiting. HPI     Home Medications Prior to Admission medications   Medication Sig Start Date End Date Taking? Authorizing Provider  amoxicillin-clavulanate (AUGMENTIN) 500-125 MG tablet Take 1 tablet (500 mg total) by mouth 3 (three) times daily. 06/06/22   Leroy Sea, MD  apixaban (ELIQUIS) 5 MG TABS tablet TAKE 1 TABLET (5 MG TOTAL) BY MOUTH 2 (TWO) TIMES DAILY. Patient taking differently: Take 5 mg by mouth 2 (two) times daily with a meal. 05/16/22 05/16/23  [name], MD  bisacodyl (DULCOLAX) 5 MG EC tablet Take 2 tablets (10 mg total) by mouth daily as needed for moderate constipation. 06/06/22   Leroy Sea, MD  carbidopa-levodopa (SINEMET CR) 50-200 MG tablet TAKE 1 TABLET BY MOUTH AT BEDTIME. 05/16/22   [name], MD  carbidopa-levodopa (SINEMET IR) 25-100 MG tablet TAKE 1 & 1/2 TABLETS BY  MOUTH 5 TIMES A DAY, AT 8 AM, 11 AM, 2 PM, 5 PM AND 8 PM. Patient taking differently: Take 1.5 tablets by mouth See admin instructions. Take 1 1/2 tablet by mouth 5 times daily - 8am, 11am, 2pm, 5pm and 8pm 05/16/22 05/16/23  [name], MD  docusate sodium (COLACE) 100 MG capsule Take 2 capsules (200 mg total) by mouth 2 (two) times daily as needed for mild constipation. 06/06/22   Leroy Sea, MD  melatonin 5 MG TABS Take 5 mg by mouth at bedtime.    [provider]  pantoprazole (PROTONIX) 40 MG tablet Take 1 tablet (40 mg total) by mouth daily. 06/07/22   Leroy Sea, MD  propranolol ER (INDERAL LA) 60 MG 24 hr capsule TAKE 1 CAPSULE (60 MG TOTAL) BY MOUTH DAILY. Patient taking differently: Take 60 mg by mouth at bedtime. 05/16/22   [name], MD      Allergies    Patient has no known allergies.    Review of Systems   Review of Systems  Constitutional:  Negative for chills and fever.  HENT:  Negative for ear pain and sore throat.   Eyes:  Negative for pain and visual disturbance.  Respiratory:  Negative for cough and shortness of breath.   Cardiovascular:  Negative for chest pain and palpitations.  Gastrointestinal:  Positive for abdominal pain and constipation. Negative for vomiting.  Genitourinary:  Negative for dysuria and hematuria.  Musculoskeletal:  Negative for arthralgias and back pain.  Skin:  Negative for color change and rash.  Neurological:  Negative for seizures and syncope.  All other systems reviewed and are negative.   Physical Exam Updated Vital Signs There were no vitals taken for this visit. Physical Exam Vitals and nursing note reviewed.  Constitutional:      General: He is not in acute distress.    Appearance: He is well-developed.  HENT:     Head: Normocephalic and atraumatic.  Eyes:     Conjunctiva/sclera: Conjunctivae normal.  Cardiovascular:     Rate and Rhythm: Normal rate and regular rhythm.     Heart sounds: No murmur heard. Pulmonary:     Effort: Pulmonary effort is normal. No respiratory distress.     Breath sounds: Normal breath sounds.  Abdominal:     Palpations: Abdomen is soft.     Tenderness: There is abdominal tenderness (bilateral lower abdomen and suprapubic).     Hernia: A hernia (left inguinal, reducible, no overlying skin changes) is present.     Comments: Drain in place to RUQ with sutures in place, mild surrounding erythema, tubing approx 10cm long with bright green  leakage, not attached to gravity drain  Genitourinary:    Rectum: Normal.     Comments: No gross blood on rectal exam, nontender  Musculoskeletal:        General: No swelling.     Cervical back: Neck supple.  Skin:    General: Skin is warm and dry.     Capillary Refill: Capillary refill takes less than 2 seconds.  Neurological:     Mental Status: He is alert.  Psychiatric:        Mood and Affect: Mood normal.     ED Results / Procedures / Treatments   Labs (all labs ordered are listed, but only abnormal results are displayed) Labs Reviewed - No data to display  EKG None  Radiology No results found.  Procedures Procedures  {Document cardiac monitor, telemetry assessment procedure when appropriate:1}  Medications Ordered in ED Medications - No data to display  ED Course/ Medical Decision Making/ A&P                           Medical Decision Making Amount and/or Complexity of Data Reviewed Labs: ordered. Radiology: ordered. ECG/medicine tests: ordered.  Risk Prescription drug management. Decision regarding hospitalization.    81 year old male with past medical history of A-fib, Parkinson disease, and admission 1 month ago for sepsis secondary to acute gangrenous cholecystitis s/p cholecystostomy drain placed 06/02/2022 presenting with concern for malfunction of the cholecystostomy tube x1 day.  Patient has also had 7 days of constipation and decreased p.o. intake. On arrival, patient was noted to be hypotensive to 85/66.  He is afebrile.  However with abdominal pain and recent admission for sepsis, concern for infection, blood cultures sent and Zosyn given.  Diagnosis includes sepsis biliary disease, intra-abdominal infection, drain malfunction, UTI, constipation, bowel obstruction, AKI, electrolyte abnormalities.  Bag placed around the end of the cholecystostomy tube, and has continued to drain.  IR was consulted and plans to replace tube tomorrow.  Labs  reviewed and notable for AKI with creatinine 1.7, up from baseline of 0.9. Potassium 5.5, repeat potassium labs sent. No leukocytosis or lactic acidosis.  Blood cultures pending. Patient given initially 1 L IV fluids and then placed on maintenance fluids.    CT showed appropriate placement of the percutaneous cholecystostomy tube.  Did note a markedly distended bladder with bilateral hydroureter with an enlarged prostate concerning for obstructive uropathy.  Patient does report he has been able to continue urinating.  He initially denied any urinary symptoms.  Postvoid residual bladder scan was ordered to evaluate need for Foley catheter placement.  This could also be contributing to his AKI. CT also shows a large left inguinal hernia containing a loop of nondilated ileum, on exam he does have swelling to the left groin, but without any overlying skin changes and this does appear  to be reducible.  He does have mild tenderness associated with palpation.  Due to AKI, patient will require admission, which will also allow for cholecystostomy tube placement tomorrow by IR.  Initial lactic acid within normal limits, however repeat was elevated to 4.3.  Additional IV fluid bolus ordered.  Patient's vitals remained stable and he is afebrile.  He has received Zosyn.  Patient admitted to the hospitalist service.   {Document critical care time when appropriate:1} {Document review of labs and clinical decision tools ie heart score, Chads2Vasc2 etc:1}  {Document your independent review of radiology images, and any outside records:1} {Document your discussion with family members, caretakers, and with consultants:1} {Document social determinants of health affecting pt's care:1} {Document your decision making why or why not admission, treatments were needed:1} Final Clinical Impression(s) / ED Diagnoses Final diagnoses:  None    Rx / DC Orders ED Discharge Orders     None

## 2022-07-05 NOTE — ED Notes (Signed)
This RN collected lactic acid from straight stick and just sent to lab, results pending.

## 2022-07-06 ENCOUNTER — Other Ambulatory Visit: Payer: Self-pay

## 2022-07-06 ENCOUNTER — Observation Stay (HOSPITAL_COMMUNITY): Payer: Medicare Other

## 2022-07-06 ENCOUNTER — Encounter (HOSPITAL_COMMUNITY): Payer: Self-pay | Admitting: Internal Medicine

## 2022-07-06 DIAGNOSIS — E872 Acidosis, unspecified: Secondary | ICD-10-CM

## 2022-07-06 DIAGNOSIS — T85518A Breakdown (mechanical) of other gastrointestinal prosthetic devices, implants and grafts, initial encounter: Secondary | ICD-10-CM | POA: Diagnosis not present

## 2022-07-06 DIAGNOSIS — R748 Abnormal levels of other serum enzymes: Secondary | ICD-10-CM

## 2022-07-06 DIAGNOSIS — N179 Acute kidney failure, unspecified: Secondary | ICD-10-CM

## 2022-07-06 HISTORY — PX: IR EXCHANGE BILIARY DRAIN: IMG6046

## 2022-07-06 LAB — COMPREHENSIVE METABOLIC PANEL
ALT: <5 U/L (ref 0–44)
AST: 8 U/L — ABNORMAL LOW (ref 15–41)
Albumin: 2.5 g/dL — ABNORMAL LOW (ref 3.5–5.0)
Alkaline Phosphatase: 100 U/L (ref 38–126)
Anion gap: 11 (ref 5–15)
BUN: 23 mg/dL (ref 8–23)
CO2: 21 mmol/L — ABNORMAL LOW (ref 22–32)
Calcium: 8.4 mg/dL — ABNORMAL LOW (ref 8.9–10.3)
Chloride: 104 mmol/L (ref 98–111)
Creatinine, Ser: 1.61 mg/dL — ABNORMAL HIGH (ref 0.61–1.24)
GFR, Estimated: 43 mL/min — ABNORMAL LOW (ref >60)
Glucose, Bld: 74 mg/dL (ref 70–99)
Potassium: 4.8 mmol/L (ref 3.5–5.1)
Sodium: 136 mmol/L (ref 135–145)
Total Bilirubin: 1.1 mg/dL (ref 0.3–1.2)
Total Protein: 5.6 g/dL — ABNORMAL LOW (ref 6.5–8.1)

## 2022-07-06 LAB — BLOOD CULTURE ID PANEL (REFLEXED) - BCID2

## 2022-07-06 LAB — CBC
HCT: 38.1 % — ABNORMAL LOW (ref 39.0–52.0)
Hemoglobin: 13.2 g/dL (ref 13.0–17.0)
MCH: 32.2 pg (ref 26.0–34.0)
MCHC: 34.6 g/dL (ref 30.0–36.0)
MCV: 92.9 fL (ref 80.0–100.0)
Platelets: 285 10*3/uL (ref 150–400)
RBC: 4.1 MIL/uL — ABNORMAL LOW (ref 4.22–5.81)
RDW: 14.2 % (ref 11.5–15.5)
WBC: 7.5 10*3/uL (ref 4.0–10.5)
nRBC: 0 % (ref 0.0–0.2)

## 2022-07-06 LAB — LACTIC ACID, PLASMA: Lactic Acid, Venous: 2 mmol/L (ref 0.5–1.9)

## 2022-07-06 MED ORDER — PANTOPRAZOLE SODIUM 40 MG PO TBEC
40.0000 mg | DELAYED_RELEASE_TABLET | Freq: Every day | ORAL | Status: DC
  Administered 2022-07-07: 40 mg via ORAL
  Filled 2022-07-06 (×2): qty 1

## 2022-07-06 MED ORDER — APIXABAN 2.5 MG PO TABS: 2.5000 mg | ORAL_TABLET | Freq: Two times a day (BID) | ORAL | Status: DC

## 2022-07-06 MED ORDER — CARBIDOPA-LEVODOPA ER 50-200 MG PO TBCR
1.0000 | EXTENDED_RELEASE_TABLET | Freq: Every day | ORAL | Status: DC
  Administered 2022-07-06: 1 via ORAL
  Filled 2022-07-06 (×2): qty 1

## 2022-07-06 MED ORDER — IOHEXOL 300 MG/ML  SOLN
100.0000 mL | Freq: Once | INTRAMUSCULAR | Status: AC | PRN
  Administered 2022-07-06: 10 mL

## 2022-07-06 MED ORDER — APIXABAN 2.5 MG PO TABS
2.5000 mg | ORAL_TABLET | Freq: Two times a day (BID) | ORAL | Status: DC
  Administered 2022-07-06 – 2022-07-07 (×2): 2.5 mg via ORAL
  Filled 2022-07-06 (×2): qty 1

## 2022-07-06 MED ORDER — PROPRANOLOL HCL ER 60 MG PO CP24
60.0000 mg | ORAL_CAPSULE | Freq: Every day | ORAL | Status: DC
  Filled 2022-07-06: qty 1

## 2022-07-06 MED ORDER — CARBIDOPA-LEVODOPA 25-100 MG PO TABS
1.5000 | ORAL_TABLET | Freq: Every day | ORAL | Status: DC
  Administered 2022-07-06 – 2022-07-07 (×6): 1.5 via ORAL
  Filled 2022-07-06 (×6): qty 2

## 2022-07-06 MED ORDER — LIDOCAINE HCL 1 % IJ SOLN
INTRAMUSCULAR | Status: AC
  Filled 2022-07-06: qty 20

## 2022-07-06 MED ORDER — SODIUM CHLORIDE 0.9% FLUSH
5.0000 mL | Freq: Three times a day (TID) | INTRAVENOUS | Status: DC
  Administered 2022-07-06 – 2022-07-07 (×3): 5 mL

## 2022-07-06 MED ORDER — APIXABAN 5 MG PO TABS: 5.0000 mg | ORAL_TABLET | Freq: Two times a day (BID) | ORAL | Status: DC

## 2022-07-06 MED ORDER — SODIUM CHLORIDE 0.9% FLUSH
5.0000 mL | Freq: Every day | INTRAVENOUS | Status: DC
  Administered 2022-07-07: 5 mL

## 2022-07-06 MED ORDER — LIDOCAINE HCL (PF) 1 % IJ SOLN
INTRAMUSCULAR | Status: AC | PRN
  Administered 2022-07-06: 5 mL

## 2022-07-06 MED ORDER — CARBIDOPA-LEVODOPA 25-100 MG PO TABS: 1.5000 | ORAL_TABLET | ORAL | Status: DC

## 2022-07-06 MED ORDER — ENSURE ENLIVE PO LIQD
237.0000 mL | Freq: Two times a day (BID) | ORAL | Status: DC
  Administered 2022-07-06 – 2022-07-07 (×3): 237 mL via ORAL
  Filled 2022-07-06 (×2): qty 237

## 2022-07-06 NOTE — Progress Notes (Signed)
IR was contacted to eval the perc chole tube.    CT reviewed by Dr. Gordy Levan, the tube is in appropriate position.  Patient seen in ED, the perc chole tube is broke, needs exchange.    IR team notified, will exchange the tube today. No sedation needed.   IR will follow the patient during this hospitalization.   Drain care plan: - flush the drain with 5 mL NS qd  - measure output q shift - dressing change as needed   Please call IR for questions and concerns.   Lynann Bologna Chas Axel PA-C 07/06/2022 3:31 PM

## 2022-07-06 NOTE — Progress Notes (Signed)
PHARMACY - PHYSICIAN COMMUNICATION CRITICAL VALUE ALERT - BLOOD CULTURE IDENTIFICATION (BCID)  Sean Ward is an 81 y.o. male who presented to [facility] on 07/05/2022 with a chief complaint of cholecystostomy tube dysfunction.   Assessment:  7 yoM with PMH of afib (eliquis), parkinson's who presented for cholecystotomy tube dysfunction.  Afebrile WBC WNL. BCID showed coag negative staph 1/4 likely contaminate.   Name of physician Contacted: Dr. Margo Aye  Current antibiotics: N/A  Changes to prescribed antibiotics recommended:  none.  Results for orders placed or performed during the hospital encounter of 07/05/22  Blood Culture ID Panel (Reflexed) (Collected: 07/05/2022  4:20 PM)  Result Value Ref Range   Enterococcus faecalis NOT DETECTED NOT DETECTED   Enterococcus Faecium NOT DETECTED NOT DETECTED   Listeria monocytogenes NOT DETECTED NOT DETECTED   Staphylococcus species DETECTED (A) NOT DETECTED   Staphylococcus aureus (BCID) NOT DETECTED NOT DETECTED   Staphylococcus epidermidis NOT DETECTED NOT DETECTED   Staphylococcus lugdunensis DETECTED (A) NOT DETECTED   Streptococcus species NOT DETECTED NOT DETECTED   Streptococcus agalactiae NOT DETECTED NOT DETECTED   Streptococcus pneumoniae NOT DETECTED NOT DETECTED   Streptococcus pyogenes NOT DETECTED NOT DETECTED   A.calcoaceticus-baumannii NOT DETECTED NOT DETECTED   Bacteroides fragilis NOT DETECTED NOT DETECTED   Enterobacterales NOT DETECTED NOT DETECTED   Enterobacter cloacae complex NOT DETECTED NOT DETECTED   Escherichia coli NOT DETECTED NOT DETECTED   Klebsiella aerogenes NOT DETECTED NOT DETECTED   Klebsiella oxytoca NOT DETECTED NOT DETECTED   Klebsiella pneumoniae NOT DETECTED NOT DETECTED   Proteus species NOT DETECTED NOT DETECTED   Salmonella species NOT DETECTED NOT DETECTED   Serratia marcescens NOT DETECTED NOT DETECTED   Haemophilus influenzae NOT DETECTED NOT DETECTED   Neisseria meningitidis NOT  DETECTED NOT DETECTED   Pseudomonas aeruginosa NOT DETECTED NOT DETECTED   Stenotrophomonas maltophilia NOT DETECTED NOT DETECTED   Candida albicans NOT DETECTED NOT DETECTED   Candida auris NOT DETECTED NOT DETECTED   Candida glabrata NOT DETECTED NOT DETECTED   Candida krusei NOT DETECTED NOT DETECTED   Candida parapsilosis NOT DETECTED NOT DETECTED   Candida tropicalis NOT DETECTED NOT DETECTED   Cryptococcus neoformans/gattii NOT DETECTED NOT DETECTED   Methicillin resistance mecA/C NOT DETECTED NOT DETECTED    Arabella Merles, PharmD. [facility] PGY-1  07/06/2022 5:53 PM

## 2022-07-06 NOTE — ED Notes (Signed)
Pt provided pericare and brief changed.

## 2022-07-06 NOTE — Progress Notes (Addendum)
PROGRESS NOTE  Sean Ward NFA:[id number] DOB: 09-14-1941 DOA: 07/05/2022 PCP: [name], MD  HPI/Recap of past 24 hours: Sean Ward is a 81 y.o. male with medical history significant of permanent atrial fibrillation on Eliquis, Parkinson's disease who presents to [facility] via EMS from [facility] due to cholecystotomy tube dysfunction.  While he was being transferred from the wheelchair to the bed at the facility.  EMS was activated, he was noted to be hypotensive and was taken to the ED for further evaluation and management.  IR was consulted.   07/06/22:  The patient was seen and examined at his bedside in the ED.  Denies having any pain at the site of the cholecystotomy tube.  Assessment/Plan: Principal Problem:   Cholecystostomy tube dysfunction Active Problems:   Atrial fibrillation (HCC)   Parkinson disease (HCC)   Dehydration   Lactic acidosis   AKI (acute kidney injury) (HCC)   Elevated lipase  Cholecystotomy tube dysfunction, POA Patient's cholecystectomy tube became dysfunctional after being accidentally yanked out when being transferred from wheelchair to bed IR consulted for replacement/repair, appreciate assistance Analgesics as needed   AKI, likely prerenal in the setting of dehydration. Baseline creatinine appears to be 0.9 with GFR greater than 60 Presented with creatinine of 1.71 with GFR of 40. Creatinine is downtrending with IV fluid hydration. Avoid nephrotoxic agents, dehydration and hypotension.   Monitor urine output.   Repeat renal panel in the morning.     Lactic acidosis likely secondary to severe dehydration. Lactic acid 4.3 . 4.1 > 2.0 Continue IV hydration and continue to trend lactic acid Nonseptic appearing. Afebrile with no leukocytosis. Repeat lactic acid in the morning.   Hypoalbuminemia secondary to moderate protein calorie malnutrition Albumin 2.8, protein supplement to be provided   Hyperkalemia-resolved  K+ 5.5 > 5.1,  continue to monitor potassium level with milligrams   Elevated lipase Lipase 56, patient denies abdominal pain at this time Continue to monitor and treat accordingly   Permanent atrial fibrillation on Eliquis. Rate controlled  Prior to admission on Eliquis and propanolol Home Eliquis on hold until okayed to restart by interventional radiology.   Parkinson disease Continue Sinemet   GERD Continue Protonix   DVT prophylaxis: Eliquis, on hold until okayed to restart by interventional radiology.  SCDs added.    Advance Care Planning:   Code Status: Full Code    Consults: IR   Family Communication: None at bedside      Status is: Observation     Objective: Vitals:   07/06/22 1100 07/06/22 1130 07/06/22 1200 07/06/22 1230  BP: (!) 89/69 90/64 104/78   Pulse: 69 79 73   Resp: 13 15 15    Temp:    98.4 F (36.9 C)  TempSrc:    Axillary  SpO2: 97% 98% 91%   Weight:      Height:        Intake/Output Summary (Last 24 hours) at 07/06/2022 1430 Last data filed at 07/05/2022 1835 Gross per 24 hour  Intake 1050 ml  Output --  Net 1050 ml   Filed Weights   07/05/22 1544  Weight: 77.1 kg    Exam:  General: 81 y.o. year-old male well developed well nourished in no acute distress.  Alert and oriented x3. Cardiovascular: Regular rate and rhythm with no rubs or gallops.  No thyromegaly or JVD noted.   Respiratory: Clear to auscultation with no wheezes or rales. Good inspiratory effort. Abdomen: Soft nontender nondistended with normal bowel  sounds x4 quadrants.  Right upper quadrant cholecystostomy tube. Musculoskeletal: No lower extremity edema. 2/4 pulses in all 4 extremities. Skin: No ulcerative lesions noted or rashes, Psychiatry: Mood is appropriate for condition and setting   Data Reviewed: CBC: Recent Labs  Lab 07/05/22 1633 07/06/22 0440  WBC 8.0 7.5  NEUTROABS 6.2  --   HGB 14.0 13.2  HCT 41.6 38.1*  MCV 94.5 92.9  PLT 314 285   Basic Metabolic  Panel: Recent Labs  Lab 07/05/22 1633 07/05/22 1855 07/06/22 0440  NA 138  --  136  K 5.5* 5.1 4.8  CL 101  --  104  CO2 23  --  21*  GLUCOSE 92  --  74  BUN 22  --  23  CREATININE 1.71*  --  1.61*  CALCIUM 8.9  --  8.4*   GFR: Estimated Creatinine Clearance: 36.6 mL/min (A) (by C-G formula based on SCr of 1.61 mg/dL (H)). Liver Function Tests: Recent Labs  Lab 07/05/22 1633 07/06/22 0440  AST 11* 8*  ALT <5 <5  ALKPHOS 108 100  BILITOT 1.3* 1.1  PROT 6.4* 5.6*  ALBUMIN 2.8* 2.5*   Recent Labs  Lab 07/05/22 1633  LIPASE 56*   No results for input(s): "AMMONIA" in the last 168 hours. Coagulation Profile: Recent Labs  Lab 07/05/22 1633  INR 2.0*   Cardiac Enzymes: No results for input(s): "CKTOTAL", "CKMB", "CKMBINDEX", "TROPONINI" in the last 168 hours. BNP (last 3 results) No results for input(s): "PROBNP" in the last 8760 hours. HbA1C: No results for input(s): "HGBA1C" in the last 72 hours. CBG: No results for input(s): "GLUCAP" in the last 168 hours. Lipid Profile: No results for input(s): "CHOL", "HDL", "LDLCALC", "TRIG", "CHOLHDL", "LDLDIRECT" in the last 72 hours. Thyroid Function Tests: No results for input(s): "TSH", "T4TOTAL", "FREET4", "T3FREE", "THYROIDAB" in the last 72 hours. Anemia Panel: No results for input(s): "VITAMINB12", "FOLATE", "FERRITIN", "TIBC", "IRON", "RETICCTPCT" in the last 72 hours. Urine analysis:    Component Value Date/Time   COLORURINE YELLOW 07/05/2022 2200   APPEARANCEUR HAZY (A) 07/05/2022 2200   APPEARANCEUR Clear 08/21/2017 1601   LABSPEC 1.019 07/05/2022 2200   PHURINE 5.0 07/05/2022 2200   GLUCOSEU NEGATIVE 07/05/2022 2200   HGBUR NEGATIVE 07/05/2022 2200   BILIRUBINUR NEGATIVE 07/05/2022 2200   BILIRUBINUR Negative 08/21/2017 1601   KETONESUR 5 (A) 07/05/2022 2200   PROTEINUR NEGATIVE 07/05/2022 2200   NITRITE NEGATIVE 07/05/2022 2200   LEUKOCYTESUR MODERATE (A) 07/05/2022 2200   Sepsis  Labs: @LABRCNTIP (procalcitonin:4,lacticidven:4)  ) Recent Results (from the past 240 hour(s))  Resp Panel by RT-PCR (Flu A&B, Covid) Anterior Nasal Swab     Status: None   Collection Time: 07/05/22  3:49 PM   Specimen: Anterior Nasal Swab  Result Value Ref Range Status   SARS Coronavirus 2 by RT PCR NEGATIVE NEGATIVE Final    Comment: (NOTE) SARS-CoV-2 target nucleic acids are NOT DETECTED.  The SARS-CoV-2 RNA is generally detectable in upper respiratory specimens during the acute phase of infection. The lowest concentration of SARS-CoV-2 viral copies this assay can detect is 138 copies/mL. A negative result does not preclude SARS-Cov-2 infection and should not be used as the sole basis for treatment or other patient management decisions. A negative result may occur with  improper specimen collection/handling, submission of specimen other than nasopharyngeal swab, presence of viral mutation(s) within the areas targeted by this assay, and inadequate number of viral copies(<138 copies/mL). A negative result must be combined with clinical observations, patient  history, and epidemiological information. The expected result is Negative.  Fact Sheet for Patients:  BloggerCourse.com  Fact Sheet for Healthcare Providers:  SeriousBroker.it  This test is no t yet approved or cleared by the [place] FDA and  has been authorized for detection and/or diagnosis of SARS-CoV-2 by FDA under an Emergency Use Authorization (EUA). This EUA will remain  in effect (meaning this test can be used) for the duration of the COVID-19 declaration under Section 564(b)(1) of the Act, 21 U.S.C.section 360bbb-3(b)(1), unless the authorization is terminated  or revoked sooner.       Influenza A by PCR NEGATIVE NEGATIVE Final   Influenza B by PCR NEGATIVE NEGATIVE Final    Comment: (NOTE) The Xpert Xpress SARS-CoV-2/FLU/RSV plus assay is intended as an  aid in the diagnosis of influenza from Nasopharyngeal swab specimens and should not be used as a sole basis for treatment. Nasal washings and aspirates are unacceptable for Xpert Xpress SARS-CoV-2/FLU/RSV testing.  Fact Sheet for Patients: BloggerCourse.com  Fact Sheet for Healthcare Providers: SeriousBroker.it  This test is not yet approved or cleared by the [place] FDA and has been authorized for detection and/or diagnosis of SARS-CoV-2 by FDA under an Emergency Use Authorization (EUA). This EUA will remain in effect (meaning this test can be used) for the duration of the COVID-19 declaration under Section 564(b)(1) of the Act, 21 U.S.C. section 360bbb-3(b)(1), unless the authorization is terminated or revoked.  Performed at [facility], [address] [place], [place] [address]   Blood Culture (routine x 2)     Status: None (Preliminary result)   Collection Time: 07/05/22  4:20 PM   Specimen: BLOOD  Result Value Ref Range Status   Specimen Description BLOOD SITE NOT SPECIFIED  Final   Special Requests   Final    BOTTLES DRAWN AEROBIC AND ANAEROBIC Blood Culture adequate volume   Culture   Final    NO GROWTH < 12 HOURS Performed at [facility], [address], [place], [place] [address]    Report Status PENDING  Incomplete  Blood Culture (routine x 2)     Status: None (Preliminary result)   Collection Time: 07/05/22  4:30 PM   Specimen: BLOOD  Result Value Ref Range Status   Specimen Description BLOOD SITE NOT SPECIFIED  Final   Special Requests   Final    BOTTLES DRAWN AEROBIC AND ANAEROBIC Blood Culture results may not be optimal due to an excessive volume of blood received in culture bottles   Culture   Final    NO GROWTH < 12 HOURS Performed at [facility], [address] [place], [place] [address]    Report Status PENDING  Incomplete      Studies: CT ABDOMEN PELVIS W  CONTRAST  Result Date: 07/05/2022 CLINICAL DATA:  Left lower quadrant abdominal pain EXAM: CT ABDOMEN AND PELVIS WITH CONTRAST TECHNIQUE: Multidetector CT imaging of the abdomen and pelvis was performed using the standard protocol following bolus administration of intravenous contrast. RADIATION DOSE REDUCTION: This exam was performed according to the departmental dose-optimization program which includes automated exposure control, adjustment of the mA and/or kV according to patient size and/or use of iterative reconstruction technique. CONTRAST:  4mL OMNIPAQUE IOHEXOL 350 MG/ML SOLN COMPARISON:  CT abdomen and pelvis 06/02/2022 FINDINGS: Lower chest: Biatrial enlargement.  No acute abnormality. Hepatobiliary: Cholecystostomy tube in the decompressed gallbladder. Cholelithiasis. No suspicious focal hepatic lesion. No biliary dilation. Pancreas: Unremarkable. Spleen: Unremarkable. Adrenals/Urinary Tract:  Adrenal glands are unremarkable. Low-attenuation lesions in the kidneys are statistically likely to represent cysts. No follow-up is required. Distended bladder. Mild bilateral hydro ureter. No urinary calculi. Stomach/Bowel: Normal caliber large and small bowel. Normal stomach. Normal appendix. No bowel wall thickening. Moderate colonic stool load. Vascular/Lymphatic: Aortic atherosclerosis. No enlarged abdominal or pelvic lymph nodes. enlargement of the prostate. Reproductive: Mild enlargement of the prostate. Other: Large left inguinal hernia containing a loop of nonobstructed ileum. No free intraperitoneal fluid or air. Musculoskeletal: No acute osseous abnormality. Right convex thoracolumbar curve. IMPRESSION: 1. Interval placement of percutaneous cholecystostomy tube within the decompressed bladder. Cholelithiasis. 2. Marked distention of the bladder with bilateral hydroureter. Query obstructive uropathy given enlarged prostate. 3. Large left inguinal hernia containing a loop of nondilated ileum 4.  Aortic  Atherosclerosis (ICD10-I70.0). Electronically Signed   By: Minerva Fester M.D.   On: 07/05/2022 20:17   DG Chest Port 1 View  Result Date: 07/05/2022 CLINICAL DATA:  Possible sepsis EXAM: PORTABLE CHEST 1 VIEW COMPARISON:  03/30/2022 FINDINGS: Transverse diameter of heart is slightly increased. There are no signs of pulmonary edema or focal pulmonary consolidation. There is no pleural effusion or pneumothorax. A skin fold is noted overlying the right upper lung field and the superior mediastinum. IMPRESSION: No active disease. Electronically Signed   By: Ernie Avena M.D.   On: 07/05/2022 16:25    Scheduled Meds:  apixaban  2.5 mg Oral BID   carbidopa-levodopa  1 tablet Oral QHS   carbidopa-levodopa  1.5 tablet Oral 5 X Daily   feeding supplement  237 mL Oral BID BM   pantoprazole  40 mg Oral Daily   [START ON 07/07/2022] propranolol ER  60 mg Oral QHS    Continuous Infusions:  lactated ringers 100 mL/hr at 07/06/22 0856     LOS: 0 days     Darlin Drop, MD [facility] Pager [phone]  If 7PM-7AM, please contact night-coverage [email or web address] Password [email or web address] 07/06/2022, 2:30 PM

## 2022-07-06 NOTE — ED Notes (Signed)
Pt BP has trended down check flowsheet. I have checked twice and readjusted cuff. 86/70 (77) He is asymptomatic HR 75, O2 99, and RR 16. Aox4  Dow Adolph MD notified.

## 2022-07-06 NOTE — ED Notes (Signed)
Pt stated he normally bed bound at this facility and the staff assists with his ADL's. Pt has hx of parkinson's. Pt takes medication with apple sauce.

## 2022-07-06 NOTE — ED Notes (Signed)
Pt transported to IR 

## 2022-07-06 NOTE — Procedures (Signed)
Interventional Radiology Procedure Note  Procedure: Image guided drain exchange, new 86F perc chole  Findings: Old drain damaged, cracked at the connection.  Complications: None  Recommendations: - Routine drain care, record output - routine wound care  Signed,  Yvone Neu. Loreta Ave, DO

## 2022-07-06 NOTE — ED Notes (Signed)
Pt bag emptied and linen changed

## 2022-07-07 DIAGNOSIS — T85518A Breakdown (mechanical) of other gastrointestinal prosthetic devices, implants and grafts, initial encounter: Secondary | ICD-10-CM | POA: Diagnosis not present

## 2022-07-07 LAB — PHOSPHORUS: Phosphorus: 2.9 mg/dL (ref 2.5–4.6)

## 2022-07-07 LAB — CBC
HCT: 36.5 % — ABNORMAL LOW (ref 39.0–52.0)
Hemoglobin: 12.5 g/dL — ABNORMAL LOW (ref 13.0–17.0)
MCH: 31.4 pg (ref 26.0–34.0)
MCHC: 34.2 g/dL (ref 30.0–36.0)
MCV: 91.7 fL (ref 80.0–100.0)
Platelets: 238 10*3/uL (ref 150–400)
RBC: 3.98 MIL/uL — ABNORMAL LOW (ref 4.22–5.81)
RDW: 14.1 % (ref 11.5–15.5)
WBC: 5 10*3/uL (ref 4.0–10.5)
nRBC: 0 % (ref 0.0–0.2)

## 2022-07-07 LAB — COMPREHENSIVE METABOLIC PANEL
ALT: <5 U/L (ref 0–44)
AST: 10 U/L — ABNORMAL LOW (ref 15–41)
Albumin: 2.2 g/dL — ABNORMAL LOW (ref 3.5–5.0)
Alkaline Phosphatase: 93 U/L (ref 38–126)
Anion gap: 9 (ref 5–15)
BUN: 21 mg/dL (ref 8–23)
CO2: 21 mmol/L — ABNORMAL LOW (ref 22–32)
Calcium: 8.1 mg/dL — ABNORMAL LOW (ref 8.9–10.3)
Chloride: 106 mmol/L (ref 98–111)
Creatinine, Ser: 1.38 mg/dL — ABNORMAL HIGH (ref 0.61–1.24)
GFR, Estimated: 52 mL/min — ABNORMAL LOW (ref >60)
Glucose, Bld: 102 mg/dL — ABNORMAL HIGH (ref 70–99)
Potassium: 4.4 mmol/L (ref 3.5–5.1)
Sodium: 136 mmol/L (ref 135–145)
Total Bilirubin: 0.9 mg/dL (ref 0.3–1.2)
Total Protein: 4.9 g/dL — ABNORMAL LOW (ref 6.5–8.1)

## 2022-07-07 LAB — LACTIC ACID, PLASMA: Lactic Acid, Venous: 1.5 mmol/L (ref 0.5–1.9)

## 2022-07-07 LAB — MAGNESIUM: Magnesium: 1.9 mg/dL (ref 1.7–2.4)

## 2022-07-07 MED ORDER — LACTATED RINGERS IV BOLUS
500.0000 mL | Freq: Once | INTRAVENOUS | Status: AC
  Administered 2022-07-07: 500 mL via INTRAVENOUS

## 2022-07-07 NOTE — Discharge Summary (Signed)
PatientPhysician Discharge Summary  Sean Ward [id number] DOB: 1941-08-19 DOA: 07/05/2022  PCP: Elsie Stain, MD  Admit date: 07/05/2022 Discharge date: 07/07/2022 30 Day Unplanned Readmission Risk Score    Flowsheet Row ED to Hosp-Admission (Discharged) from 06/02/2022 in [facility]  30 Day Unplanned Readmission Risk Score (%) 19.15 Filed at 06/06/2022 1200       This score is the patient's risk of an unplanned readmission within 30 days of being discharged (0 -100%). The score is based on dignosis, age, lab data, medications, orders, and past utilization.   Low:  0-14.9   Medium: 15-21.9   High: 22-29.9   Extreme: 30 and above          Admitted From: SNF Disposition:  SNF  Recommendations for Outpatient Follow-up:  Follow up with PCP in 1-2 weeks Please obtain BMP/CBC in one week Please follow up with your PCP on the following pending results: Unresulted Labs (From admission, onward)    None         Home Health: None Equipment/Devices: None  Discharge Condition: Stable CODE STATUS: Full code Diet recommendation:   Subjective: Seen and examined.  No complaints.  Brief/Interim Summary: Sean Ward is a 81 y.o. male with medical history significant of permanent atrial fibrillation on Eliquis, Parkinson's disease who presented to [facility] via EMS from [facility] due to cholecystotomy tube dysfunction.  While he was being transferred from the wheelchair to the bed at the facility.  EMS was activated, he was noted to be hypotensive and was taken to the ED for further evaluation and management.  IR was consulted.  Drain was exchanged on 07/06/2022 which is working now and has been cleared by IR for discharge.  Patient has no complaints.  He will be discharged in stable condition today.   AKI, likely prerenal in the setting of dehydration. Baseline creatinine appears to be 0.9 with GFR greater than 60 Presented with creatinine  of 1.71 with GFR of 40.  Creatinine improved 1.38 today.  He is still getting fluids until he gets discharged hopefully his creatinine will be normalized in 1 to 2 days.   Lactic acidosis likely secondary to severe dehydration. Resolved.   Hypoalbuminemia secondary to moderate protein calorie malnutrition Albumin 2.8, protein supplement to be provided   Hyperkalemia-resolved   Permanent atrial fibrillation on Eliquis. Rate controlled  Prior to admission on Eliquis and propanolol Home Eliquis on hold until okayed to restart by interventional radiology.   Parkinson disease Continue Sinemet   GERD Continue Protonix  Discharge plan was discussed with patient and/or family member and they verbalized understanding and agreed with it.  Discharge Diagnoses:  Principal Problem:   Cholecystostomy tube dysfunction Active Problems:   Atrial fibrillation (HCC)   Parkinson disease (HCC)   Dehydration   Lactic acidosis   AKI (acute kidney injury) ([place])   Elevated lipase    Discharge Instructions   Allergies as of 07/07/2022   No Known Allergies      Medication List     TAKE these medications    acetaminophen 650 MG CR tablet Commonly known as: TYLENOL Take 650 mg by mouth 2 (two) times daily.   bisacodyl 5 MG EC tablet Commonly known as: DULCOLAX Take 2 tablets (10 mg total) by mouth daily as needed for moderate constipation.   carbidopa-levodopa 25-100 MG tablet Commonly known as: SINEMET IR TAKE 1 & 1/2 TABLETS BY  MOUTH 5 TIMES A DAY, AT 8 AM,  11 AM, 2 PM, 5 PM AND 8 PM. What changed:  how much to take how to take this when to take this additional instructions   carbidopa-levodopa 50-200 MG tablet Commonly known as: SINEMET CR TAKE 1 TABLET BY MOUTH AT BEDTIME. What changed: Another medication with the same name was changed. Make sure you understand how and when to take each.   docusate sodium 100 MG capsule Commonly known as: COLACE Take 2 capsules (200 mg  total) by mouth 2 (two) times daily as needed for mild constipation. What changed: when to take this   Eliquis 5 MG Tabs tablet Generic drug: apixaban TAKE 1 TABLET (5 MG TOTAL) BY MOUTH 2 (TWO) TIMES DAILY. What changed:  how much to take when to take this   gabapentin 100 MG capsule Commonly known as: NEURONTIN Take 100 mg by mouth 3 (three) times daily.   lidocaine 4 % Place 1 patch onto the skin daily. Apply to left side for 2 weeks   melatonin 5 MG Tabs Take 10 mg by mouth at bedtime.   ondansetron 4 MG tablet Commonly known as: ZOFRAN Take 4 mg by mouth every 8 (eight) hours as needed for nausea or vomiting.   pantoprazole 40 MG tablet Commonly known as: PROTONIX Take 1 tablet (40 mg total) by mouth daily.   potassium chloride 10 MEQ tablet Commonly known as: KLOR-CON M Take 10 mEq by mouth daily.   propranolol ER 60 MG 24 hr capsule Commonly known as: INDERAL LA TAKE 1 CAPSULE (60 MG TOTAL) BY MOUTH DAILY. What changed: how much to take        Follow-up Information     Elsie Stain, MD Follow up in 1 week(s).   Specialty: Pulmonary Disease Contact information: [address] [address] [phone]         Jettie Booze, MD .   Specialties: Cardiology, Radiology, Interventional Cardiology Contact information: [id number] [place] [address] [phone]                No Known Allergies  Consultations: IR   Procedures/Studies: IR EXCHANGE BILIARY DRAIN  Result Date: 07/06/2022 INDICATION: 81 year old male with a history of percutaneous cholecystostomy 06/02/2022, presents for exchange given the damaged drain EXAM: IMAGE GUIDED DRAIN EXCHANGE MEDICATIONS: None ANESTHESIA/SEDATION: None FLUOROSCOPY: Radiation Exposure Index (as provided by the fluoroscopic device): 6 mGy Kerma COMPLICATIONS: None PROCEDURE: Informed written consent was obtained from the patient after a thorough  discussion of the procedural risks, benefits and alternatives. All questions were addressed. Maximal Sterile Barrier Technique was utilized including caps, mask, sterile gowns, sterile gloves, sterile drape, hand hygiene and skin antiseptic. A timeout was performed prior to the initiation of the procedure. Patient positioned supine position under the image intensifier the right upper quadrant including the indwelling drain were prepped and draped in the usual sterile fashion. Scout images were acquired. Contrast was injected in a make shift manner through the damage drain confirming location within the gallbladder. 1% lidocaine was used for local anesthesia. The retention suture was cut, drain was amputated, and modified Seldinger technique was used to place a new 10 [place] drain into the gallbladder lumen. Pigtail catheter was formed, contrast was injected confirming location, and the drain was sutured in position. Attached to gravity drain. Final images were stored. Patient tolerated the procedure well and remained hemodynamically stable throughout. No complications were encountered and no significant blood loss. IMPRESSION: Status post routine exchange  of a fracture percutaneous cholecystostomy with a new 10 French drain placed. Signed, Dulcy Fanny. Nadene Rubins, RPVI [facility] Radiology Electronically Signed   By: Corrie Mckusick D.O.   On: 07/06/2022 16:49   CT ABDOMEN PELVIS W CONTRAST  Result Date: 07/05/2022 CLINICAL DATA:  Left lower quadrant abdominal pain EXAM: CT ABDOMEN AND PELVIS WITH CONTRAST TECHNIQUE: Multidetector CT imaging of the abdomen and pelvis was performed using the standard protocol following bolus administration of intravenous contrast. RADIATION DOSE REDUCTION: This exam was performed according to the departmental dose-optimization program which includes automated exposure control, adjustment of the mA and/or kV according to patient  size and/or use of iterative reconstruction technique. CONTRAST:  83mL OMNIPAQUE IOHEXOL 350 MG/ML SOLN COMPARISON:  CT abdomen and pelvis 06/02/2022 FINDINGS: Lower chest: Biatrial enlargement.  No acute abnormality. Hepatobiliary: Cholecystostomy tube in the decompressed gallbladder. Cholelithiasis. No suspicious focal hepatic lesion. No biliary dilation. Pancreas: Unremarkable. Spleen: Unremarkable. Adrenals/Urinary Tract: Adrenal glands are unremarkable. Low-attenuation lesions in the kidneys are statistically likely to represent cysts. No follow-up is required. Distended bladder. Mild bilateral hydro ureter. No urinary calculi. Stomach/Bowel: Normal caliber large and small bowel. Normal stomach. Normal appendix. No bowel wall thickening. Moderate colonic stool load. Vascular/Lymphatic: Aortic atherosclerosis. No enlarged abdominal or pelvic lymph nodes. enlargement of the prostate. Reproductive: Mild enlargement of the prostate. Other: Large left inguinal hernia containing a loop of nonobstructed ileum. No free intraperitoneal fluid or air. Musculoskeletal: No acute osseous abnormality. Right convex thoracolumbar curve. IMPRESSION: 1. Interval placement of percutaneous cholecystostomy tube within the decompressed bladder. Cholelithiasis. 2. Marked distention of the bladder with bilateral hydroureter. Query obstructive uropathy given enlarged prostate. 3. Large left inguinal hernia containing a loop of nondilated ileum 4.  Aortic Atherosclerosis (ICD10-I70.0). Electronically Signed   By: Placido Sou M.D.   On: 07/05/2022 20:17   DG Chest Port 1 View  Result Date: 07/05/2022 CLINICAL DATA:  Possible sepsis EXAM: PORTABLE CHEST 1 VIEW COMPARISON:  03/30/2022 FINDINGS: Transverse diameter of heart is slightly increased. There are no signs of pulmonary edema or focal pulmonary consolidation. There is no pleural effusion or pneumothorax. A skin fold is noted overlying the right upper lung field and the  superior mediastinum. IMPRESSION: No active disease. Electronically Signed   By: Elmer Picker M.D.   On: 07/05/2022 16:25   DG CHOLANGIOGRAM  EXISTING TUBE  Result Date: 06/26/2022 INDICATION: 81 year old male with prior percutaneous cholecystostomy 06/02/2022 EXAM: IMAGE GUIDED DRAIN INJECTION MEDICATIONS: None ANESTHESIA/SEDATION: None FLUOROSCOPY: Radiation Exposure Index (as provided by the fluoroscopic device): 7.8 mGy Kerma COMPLICATIONS: None PROCEDURE: Informed written consent was obtained from the patient after a thorough discussion of the procedural risks, benefits and alternatives. All questions were addressed. Clean technique was utilized including caps, mask, sterile gloves, sterile drape, hand hygiene and skin antiseptic. A timeout was performed prior to the initiation of the procedure. Scout images were acquired. Using clean technique the indwelling percutaneous cholecystostomy was injected with contrast. Images were stored and sent to PACs. The drain was flushed with saline and attached to gravity. Patient tolerated the procedure well and remained hemodynamically stable throughout. No complications encountered and no blood loss. FINDINGS: Cholangiogram demonstrates a patent cholecystostomy drain, retained radiolucent gallstones within the gallbladder, and a patent cystic duct. Patent common bile duct and ampulla with contrast entering the duodenum. IMPRESSION: Cholangiogram via percutaneous cholecystostomy demonstrates retained cholelithiasis, patent cystic duct and common bile duct, as above. Electronically Signed   By: Corrie Mckusick  D.O.   On: 06/26/2022 15:17   IR Radiologist Eval & Mgmt  Result Date: 06/26/2022 INDICATION: 81 year old male with prior percutaneous cholecystostomy 06/02/2022 EXAM: IMAGE GUIDED DRAIN INJECTION MEDICATIONS: None ANESTHESIA/SEDATION: None FLUOROSCOPY: Radiation Exposure Index (as provided by the fluoroscopic device): 7.8 mGy Kerma COMPLICATIONS: None  PROCEDURE: Informed written consent was obtained from the patient after a thorough discussion of the procedural risks, benefits and alternatives. All questions were addressed. Clean technique was utilized including caps, mask, sterile gloves, sterile drape, hand hygiene and skin antiseptic. A timeout was performed prior to the initiation of the procedure. Scout images were acquired. Using clean technique the indwelling percutaneous cholecystostomy was injected with contrast. Images were stored and sent to PACs. The drain was flushed with saline and attached to gravity. Patient tolerated the procedure well and remained hemodynamically stable throughout. No complications encountered and no blood loss. FINDINGS: Cholangiogram demonstrates a patent cholecystostomy drain, retained radiolucent gallstones within the gallbladder, and a patent cystic duct. Patent common bile duct and ampulla with contrast entering the duodenum. IMPRESSION: Cholangiogram via percutaneous cholecystostomy demonstrates retained cholelithiasis, patent cystic duct and common bile duct, as above. Electronically Signed   By: Gilmer Mor D.O.   On: 06/26/2022 15:17     Discharge Exam: Vitals:   07/07/22 0800 07/07/22 0848  BP:  123/87  Pulse:  65  Resp:  17  Temp:  (!) 97.5 F (36.4 C)  SpO2: 97% 98%   Vitals:   07/07/22 0124 07/07/22 0529 07/07/22 0800 07/07/22 0848  BP: (!) 88/72 108/66  123/87  Pulse: 72 89  65  Resp: 17 17  17   Temp: 98.2 F (36.8 C) 97.6 F (36.4 C)  (!) 97.5 F (36.4 C)  TempSrc: Oral Axillary  Oral  SpO2: 98% 100% 97% 98%  Weight:      Height:        General: Pt is alert, awake, not in acute distress Cardiovascular: Irregularly irregular rate and rhythm, S1/S2 +, no rubs, no gallops Respiratory: CTA bilaterally, no wheezing, no rhonchi Abdominal: Soft, NT, ND, bowel sounds + Extremities: no edema, no cyanosis    The results of significant diagnostics from this hospitalization (including  imaging, microbiology, ancillary and laboratory) are listed below for reference.     Microbiology: Recent Results (from the past 240 hour(s))  Resp Panel by RT-PCR (Flu A&B, Covid) Anterior Nasal Swab     Status: None   Collection Time: 07/05/22  3:49 PM   Specimen: Anterior Nasal Swab  Result Value Ref Range Status   SARS Coronavirus 2 by RT PCR NEGATIVE NEGATIVE Final    Comment: (NOTE) SARS-CoV-2 target nucleic acids are NOT DETECTED.  The SARS-CoV-2 RNA is generally detectable in upper respiratory specimens during the acute phase of infection. The lowest concentration of SARS-CoV-2 viral copies this assay can detect is 138 copies/mL. A negative result does not preclude SARS-Cov-2 infection and should not be used as the sole basis for treatment or other patient management decisions. A negative result may occur with  improper specimen collection/handling, submission of specimen other than nasopharyngeal swab, presence of viral mutation(s) within the areas targeted by this assay, and inadequate number of viral copies(<138 copies/mL). A negative result must be combined with clinical observations, patient history, and epidemiological information. The expected result is Negative.  Fact Sheet for Patients:  BloggerCourse.com  Fact Sheet for Healthcare Providers:  SeriousBroker.it  This test is no t yet approved or cleared by the [place] FDA and  has been  authorized for detection and/or diagnosis of SARS-CoV-2 by FDA under an Emergency Use Authorization (EUA). This EUA will remain  in effect (meaning this test can be used) for the duration of the COVID-19 declaration under Section 564(b)(1) of the Act, 21 U.S.C.section 360bbb-3(b)(1), unless the authorization is terminated  or revoked sooner.       Influenza A by PCR NEGATIVE NEGATIVE Final   Influenza B by PCR NEGATIVE NEGATIVE Final    Comment: (NOTE) The Xpert Xpress  SARS-CoV-2/FLU/RSV plus assay is intended as an aid in the diagnosis of influenza from Nasopharyngeal swab specimens and should not be used as a sole basis for treatment. Nasal washings and aspirates are unacceptable for Xpert Xpress SARS-CoV-2/FLU/RSV testing.  Fact Sheet for Patients: EntrepreneurPulse.com.au  Fact Sheet for Healthcare Providers: IncredibleEmployment.be  This test is not yet approved or cleared by the [place] FDA and has been authorized for detection and/or diagnosis of SARS-CoV-2 by FDA under an Emergency Use Authorization (EUA). This EUA will remain in effect (meaning this test can be used) for the duration of the COVID-19 declaration under Section 564(b)(1) of the Act, 21 U.S.C. section 360bbb-3(b)(1), unless the authorization is terminated or revoked.  Performed at [facility], [place] [address], [place], [place] [address]   Blood Culture (routine x 2)     Status: Abnormal (Preliminary result)   Collection Time: 07/05/22  4:20 PM   Specimen: BLOOD  Result Value Ref Range Status   Specimen Description BLOOD SITE NOT SPECIFIED  Final   Special Requests   Final    BOTTLES DRAWN AEROBIC AND ANAEROBIC Blood Culture adequate volume   Culture  Setup Time   Final    GRAM POSITIVE COCCI IN CLUSTERS AEROBIC BOTTLE ONLY CRITICAL RESULT CALLED TO, READ BACK BY AND VERIFIED WITH: PHARMD Hyman Bible [id number] @1730  FH    Culture (A)  Final    STAPHYLOCOCCUS LUGDUNENSIS SUSCEPTIBILITIES TO FOLLOW Performed at [facility], [place] [address] [place], [place] [address]    Report Status PENDING  Incomplete  Blood Culture ID Panel (Reflexed)     Status: Abnormal   Collection Time: 07/05/22  4:20 PM  Result Value Ref Range Status   Enterococcus faecalis NOT DETECTED NOT DETECTED Final   Enterococcus Faecium NOT DETECTED NOT DETECTED Final   Listeria monocytogenes NOT DETECTED NOT DETECTED Final   Staphylococcus species  DETECTED (A) NOT DETECTED Final    Comment: CRITICAL RESULT CALLED TO, READ BACK BY AND VERIFIED WITH: PHARMD J. CARNEY [id number] @1730  FH    Staphylococcus aureus (BCID) NOT DETECTED NOT DETECTED Final   Staphylococcus epidermidis NOT DETECTED NOT DETECTED Final   Staphylococcus lugdunensis DETECTED (A) NOT DETECTED Final    Comment: CRITICAL RESULT CALLED TO, READ BACK BY AND VERIFIED WITH: PHARMD J. CARNEY [id number] @1730  FH    Streptococcus species NOT DETECTED NOT DETECTED Final   Streptococcus agalactiae NOT DETECTED NOT DETECTED Final   Streptococcus pneumoniae NOT DETECTED NOT DETECTED Final   Streptococcus pyogenes NOT DETECTED NOT DETECTED Final   A.calcoaceticus-baumannii NOT DETECTED NOT DETECTED Final   Bacteroides fragilis NOT DETECTED NOT DETECTED Final   Enterobacterales NOT DETECTED NOT DETECTED Final   Enterobacter cloacae complex NOT DETECTED NOT DETECTED Final   Escherichia coli NOT DETECTED NOT DETECTED Final   Klebsiella aerogenes NOT DETECTED NOT DETECTED Final   Klebsiella oxytoca NOT DETECTED NOT DETECTED Final   Klebsiella pneumoniae NOT DETECTED NOT DETECTED Final   Proteus species NOT DETECTED NOT DETECTED  Final   Salmonella species NOT DETECTED NOT DETECTED Final   Serratia marcescens NOT DETECTED NOT DETECTED Final   Haemophilus influenzae NOT DETECTED NOT DETECTED Final   Neisseria meningitidis NOT DETECTED NOT DETECTED Final   Pseudomonas aeruginosa NOT DETECTED NOT DETECTED Final   Stenotrophomonas maltophilia NOT DETECTED NOT DETECTED Final   Candida albicans NOT DETECTED NOT DETECTED Final   Candida auris NOT DETECTED NOT DETECTED Final   Candida glabrata NOT DETECTED NOT DETECTED Final   Candida krusei NOT DETECTED NOT DETECTED Final   Candida parapsilosis NOT DETECTED NOT DETECTED Final   Candida tropicalis NOT DETECTED NOT DETECTED Final   Cryptococcus neoformans/gattii NOT DETECTED NOT DETECTED Final   Methicillin resistance mecA/C NOT DETECTED NOT  DETECTED Final    Comment: Performed at [facility], [address], [place], [place] [address]  Blood Culture (routine x 2)     Status: None (Preliminary result)   Collection Time: 07/05/22  4:30 PM   Specimen: BLOOD  Result Value Ref Range Status   Specimen Description BLOOD SITE NOT SPECIFIED  Final   Special Requests   Final    BOTTLES DRAWN AEROBIC AND ANAEROBIC Blood Culture results may not be optimal due to an excessive volume of blood received in culture bottles   Culture   Final    NO GROWTH 2 DAYS Performed at [facility], [place] [address], [place], [place] [address]    Report Status PENDING  Incomplete  Urine Culture     Status: Abnormal (Preliminary result)   Collection Time: 07/06/22 12:55 AM   Specimen: In/Out Cath Urine  Result Value Ref Range Status   Specimen Description IN/OUT CATH URINE  Final   Special Requests   Final    NONE Performed at [facility], [place] [address] [place], [place] [address]    Culture >=100,000 COLONIES/mL GRAM NEGATIVE RODS (A)  Final   Report Status PENDING  Incomplete     Labs: BNP (last 3 results) Recent Labs    06/04/22 0303 06/05/22 0750 06/06/22 0322  BNP 333.8* 498.6* [id number]*   Basic Metabolic Panel: Recent Labs  Lab 07/05/22 1633 07/05/22 1855 07/06/22 0440 07/07/22 0032  NA 138  --  136 136  K 5.5* 5.1 4.8 4.4  CL 101  --  104 106  CO2 23  --  21* 21*  GLUCOSE 92  --  74 102*  BUN 22  --  23 21  CREATININE 1.71*  --  1.61* 1.38*  CALCIUM 8.9  --  8.4* 8.1*  MG  --   --   --  1.9  PHOS  --   --   --  2.9   Liver Function Tests: Recent Labs  Lab 07/05/22 1633 07/06/22 0440 07/07/22 0032  AST 11* 8* 10*  ALT <5 <5 <5  ALKPHOS 108 100 93  BILITOT 1.3* 1.1 0.9  PROT 6.4* 5.6* 4.9*  ALBUMIN 2.8* 2.5* 2.2*   Recent Labs  Lab 07/05/22 1633  LIPASE 56*   No results for input(s): "AMMONIA" in the last 168 hours. CBC: Recent Labs  Lab 07/05/22 1633 07/06/22 0440 07/07/22 0032  WBC  8.0 7.5 5.0  NEUTROABS 6.2  --   --   HGB 14.0 13.2 12.5*  HCT 41.6 38.1* 36.5*  MCV 94.5 92.9 91.7  PLT 314 285 238   Cardiac Enzymes: No results for input(s): "CKTOTAL", "CKMB", "CKMBINDEX", "TROPONINI" in the last 168 hours. BNP: Invalid input(s): "POCBNP"  CBG: No results for input(s): "GLUCAP" in the last 168 hours. D-Dimer No results for input(s): "DDIMER" in the last 72 hours. Hgb A1c No results for input(s): "HGBA1C" in the last 72 hours. Lipid Profile No results for input(s): "CHOL", "HDL", "LDLCALC", "TRIG", "CHOLHDL", "LDLDIRECT" in the last 72 hours. Thyroid function studies No results for input(s): "TSH", "T4TOTAL", "T3FREE", "THYROIDAB" in the last 72 hours.  Invalid input(s): "FREET3" Anemia work up No results for input(s): "VITAMINB12", "FOLATE", "FERRITIN", "TIBC", "IRON", "RETICCTPCT" in the last 72 hours. Urinalysis    Component Value Date/Time   COLORURINE YELLOW 07/05/2022 2200   APPEARANCEUR HAZY (A) 07/05/2022 2200   APPEARANCEUR Clear 08/21/2017 1601   LABSPEC 1.019 07/05/2022 2200   PHURINE 5.0 07/05/2022 2200   GLUCOSEU NEGATIVE 07/05/2022 2200   HGBUR NEGATIVE 07/05/2022 2200   BILIRUBINUR NEGATIVE 07/05/2022 2200   BILIRUBINUR Negative 08/21/2017 1601   KETONESUR 5 (A) 07/05/2022 2200   PROTEINUR NEGATIVE 07/05/2022 2200   NITRITE NEGATIVE 07/05/2022 2200   LEUKOCYTESUR MODERATE (A) 07/05/2022 2200   Sepsis Labs Recent Labs  Lab 07/05/22 1633 07/06/22 0440 07/07/22 0032  WBC 8.0 7.5 5.0   Microbiology Recent Results (from the past 240 hour(s))  Resp Panel by RT-PCR (Flu A&B, Covid) Anterior Nasal Swab     Status: None   Collection Time: 07/05/22  3:49 PM   Specimen: Anterior Nasal Swab  Result Value Ref Range Status   SARS Coronavirus 2 by RT PCR NEGATIVE NEGATIVE Final    Comment: (NOTE) SARS-CoV-2 target nucleic acids are NOT DETECTED.  The SARS-CoV-2 RNA is generally detectable in upper respiratory specimens during the acute  phase of infection. The lowest concentration of SARS-CoV-2 viral copies this assay can detect is 138 copies/mL. A negative result does not preclude SARS-Cov-2 infection and should not be used as the sole basis for treatment or other patient management decisions. A negative result may occur with  improper specimen collection/handling, submission of specimen other than nasopharyngeal swab, presence of viral mutation(s) within the areas targeted by this assay, and inadequate number of viral copies(<138 copies/mL). A negative result must be combined with clinical observations, patient history, and epidemiological information. The expected result is Negative.  Fact Sheet for Patients:  EntrepreneurPulse.com.au  Fact Sheet for Healthcare Providers:  IncredibleEmployment.be  This test is no t yet approved or cleared by the [place] FDA and  has been authorized for detection and/or diagnosis of SARS-CoV-2 by FDA under an Emergency Use Authorization (EUA). This EUA will remain  in effect (meaning this test can be used) for the duration of the COVID-19 declaration under Section 564(b)(1) of the Act, 21 U.S.C.section 360bbb-3(b)(1), unless the authorization is terminated  or revoked sooner.       Influenza A by PCR NEGATIVE NEGATIVE Final   Influenza B by PCR NEGATIVE NEGATIVE Final    Comment: (NOTE) The Xpert Xpress SARS-CoV-2/FLU/RSV plus assay is intended as an aid in the diagnosis of influenza from Nasopharyngeal swab specimens and should not be used as a sole basis for treatment. Nasal washings and aspirates are unacceptable for Xpert Xpress SARS-CoV-2/FLU/RSV testing.  Fact Sheet for Patients: EntrepreneurPulse.com.au  Fact Sheet for Healthcare Providers: IncredibleEmployment.be  This test is not yet approved or cleared by the [place] FDA and has been authorized for detection and/or diagnosis of  SARS-CoV-2 by FDA under an Emergency Use Authorization (EUA). This EUA will remain in effect (meaning this test can be used) for the duration of the COVID-19 declaration under Section 564(b)(1)  of the Act, 21 U.S.C. section 360bbb-3(b)(1), unless the authorization is terminated or revoked.  Performed at [facility], [place] [address], [place], [place] [address]   Blood Culture (routine x 2)     Status: Abnormal (Preliminary result)   Collection Time: 07/05/22  4:20 PM   Specimen: BLOOD  Result Value Ref Range Status   Specimen Description BLOOD SITE NOT SPECIFIED  Final   Special Requests   Final    BOTTLES DRAWN AEROBIC AND ANAEROBIC Blood Culture adequate volume   Culture  Setup Time   Final    GRAM POSITIVE COCCI IN CLUSTERS AEROBIC BOTTLE ONLY CRITICAL RESULT CALLED TO, READ BACK BY AND VERIFIED WITH: PHARMD Hyman Bible IT:[id number] @1730  FH    Culture (A)  Final    STAPHYLOCOCCUS LUGDUNENSIS SUSCEPTIBILITIES TO FOLLOW Performed at [facility], [place] [address] [place], [place] [address]    Report Status PENDING  Incomplete  Blood Culture ID Panel (Reflexed)     Status: Abnormal   Collection Time: 07/05/22  4:20 PM  Result Value Ref Range Status   Enterococcus faecalis NOT DETECTED NOT DETECTED Final   Enterococcus Faecium NOT DETECTED NOT DETECTED Final   Listeria monocytogenes NOT DETECTED NOT DETECTED Final   Staphylococcus species DETECTED (A) NOT DETECTED Final    Comment: CRITICAL RESULT CALLED TO, READ BACK BY AND VERIFIED WITH: PHARMD J. CARNEY IT:[id number] @1730  FH    Staphylococcus aureus (BCID) NOT DETECTED NOT DETECTED Final   Staphylococcus epidermidis NOT DETECTED NOT DETECTED Final   Staphylococcus lugdunensis DETECTED (A) NOT DETECTED Final    Comment: CRITICAL RESULT CALLED TO, READ BACK BY AND VERIFIED WITH: PHARMD J. CARNEY IT:[id number] @1730  FH    Streptococcus species NOT DETECTED NOT DETECTED Final   Streptococcus agalactiae NOT DETECTED NOT DETECTED Final    Streptococcus pneumoniae NOT DETECTED NOT DETECTED Final   Streptococcus pyogenes NOT DETECTED NOT DETECTED Final   A.calcoaceticus-baumannii NOT DETECTED NOT DETECTED Final   Bacteroides fragilis NOT DETECTED NOT DETECTED Final   Enterobacterales NOT DETECTED NOT DETECTED Final   Enterobacter cloacae complex NOT DETECTED NOT DETECTED Final   Escherichia coli NOT DETECTED NOT DETECTED Final   Klebsiella aerogenes NOT DETECTED NOT DETECTED Final   Klebsiella oxytoca NOT DETECTED NOT DETECTED Final   Klebsiella pneumoniae NOT DETECTED NOT DETECTED Final   Proteus species NOT DETECTED NOT DETECTED Final   Salmonella species NOT DETECTED NOT DETECTED Final   Serratia marcescens NOT DETECTED NOT DETECTED Final   Haemophilus influenzae NOT DETECTED NOT DETECTED Final   Neisseria meningitidis NOT DETECTED NOT DETECTED Final   Pseudomonas aeruginosa NOT DETECTED NOT DETECTED Final   Stenotrophomonas maltophilia NOT DETECTED NOT DETECTED Final   Candida albicans NOT DETECTED NOT DETECTED Final   Candida auris NOT DETECTED NOT DETECTED Final   Candida glabrata NOT DETECTED NOT DETECTED Final   Candida krusei NOT DETECTED NOT DETECTED Final   Candida parapsilosis NOT DETECTED NOT DETECTED Final   Candida tropicalis NOT DETECTED NOT DETECTED Final   Cryptococcus neoformans/gattii NOT DETECTED NOT DETECTED Final   Methicillin resistance mecA/C NOT DETECTED NOT DETECTED Final    Comment: Performed at [facility], [address] [place], [place] [address]  Blood Culture (routine x 2)     Status: None (Preliminary result)   Collection Time: 07/05/22  4:30 PM   Specimen: BLOOD  Result Value Ref Range Status   Specimen Description BLOOD SITE NOT SPECIFIED  Final   Special  Requests   Final    BOTTLES DRAWN AEROBIC AND ANAEROBIC Blood Culture results may not be optimal due to an excessive volume of blood received in culture bottles   Culture   Final    NO GROWTH 2 DAYS Performed at [facility], [place] [address], [place], [place] [address]    Report Status PENDING  Incomplete  Urine Culture     Status: Abnormal (Preliminary result)   Collection Time: 07/06/22 12:55 AM   Specimen: In/Out Cath Urine  Result Value Ref Range Status   Specimen Description IN/OUT CATH URINE  Final   Special Requests   Final    NONE Performed at [facility], [place] [address] [place], [place] [address]    Culture >=100,000 COLONIES/mL GRAM NEGATIVE RODS (A)  Final   Report Status PENDING  Incomplete     Time coordinating discharge: Over 30 minutes  SIGNED:   Darliss Cheney, MD  [facility] 07/07/2022, 11:14 AM *Please note that this is a verbal dictation therefore any spelling or grammatical errors are due to the "[place] One" system interpretation. If 7PM-7AM, please contact night-coverage [email or web address]

## 2022-07-07 NOTE — TOC Transition Note (Signed)
Transition of Care [facility]) - CM/SW Discharge Note   Patient Details  Name: Sean Ward MRN: [id number] Date of Birth: 02/11/1941  [facility]) CM/SW Contact:  Milinda Antis, LCSWA Phone Number: 07/07/2022, 12:16 PM   Clinical Narrative:    Patient will DC to: [facility] Anticipated DC date: 07/07/2022 Family notified: Yes Transport by: Corey Harold   Per MD patient ready for DC to SNF. RN to call report prior to discharge [phone]) [place]. RN, patient, patient's family, and facility notified of DC. Discharge Summary sent to facility. DC packet on chart. Ambulance transport requested for patient.   CSW will sign off for now as social work intervention is no longer needed. Please consult [place] again if new needs arise.     Final next level of care: Long Term Nursing Home Barriers to Discharge: No Barriers Identified   Patient Goals and CMS Choice        Discharge Placement              Patient chooses bed at:  [name]) Patient to be transferred to facility by: [facility] Name of family member notified: [name], Candy (Other)   [phone] Patient and family notified of of transfer: 07/07/22  Discharge Plan and Services                                     Social Determinants of Health (SDOH) Interventions     Readmission Risk Interventions     No data to display

## 2022-07-07 NOTE — Progress Notes (Signed)
Referring Physician(s): Bernadette Hoit, DO  Supervising Physician: Aletta Edouard  Patient Status:  [facility] - In-pt  Chief Complaint: Follow up exchange of broken percutaneous cholecystostomy 07/06/22  Subjective:  Patient sitting up in bed, states he's not nauseous but does not really feel like eating much. He has been drinking a lot of water today. He is wondering when he can go home. He states his legs and feet feel wet -- I did not see any wetness anywhere.   Allergies: Patient has no known allergies.  Medications: Prior to Admission medications   Medication Sig Start Date End Date Taking? Authorizing Provider  acetaminophen (TYLENOL) 650 MG CR tablet Take 650 mg by mouth 2 (two) times daily.   Yes [provider]  apixaban (ELIQUIS) 5 MG TABS tablet TAKE 1 TABLET (5 MG TOTAL) BY MOUTH 2 (TWO) TIMES DAILY. Patient taking differently: Take 5 mg by mouth 2 (two) times daily with a meal. 05/16/22 05/16/23 Yes Elsie Stain, MD  bisacodyl (DULCOLAX) 5 MG EC tablet Take 2 tablets (10 mg total) by mouth daily as needed for moderate constipation. 06/06/22  Yes Thurnell Lose, MD  carbidopa-levodopa (SINEMET CR) 50-200 MG tablet TAKE 1 TABLET BY MOUTH AT BEDTIME. 05/16/22  Yes Elsie Stain, MD  carbidopa-levodopa (SINEMET IR) 25-100 MG tablet TAKE 1 & 1/2 TABLETS BY  MOUTH 5 TIMES A DAY, AT 8 AM, 11 AM, 2 PM, 5 PM AND 8 PM. Patient taking differently: Take 1.5 tablets by mouth See admin instructions. Take 1 1/2 tablet by mouth 5 times daily - 8am, 11am, 2pm, 5pm and 8pm 05/16/22 05/16/23 Yes Elsie Stain, MD  docusate sodium (COLACE) 100 MG capsule Take 2 capsules (200 mg total) by mouth 2 (two) times daily as needed for mild constipation. Patient taking differently: Take 200 mg by mouth daily. 06/06/22  Yes Thurnell Lose, MD  gabapentin (NEURONTIN) 100 MG capsule Take 100 mg by mouth 3 (three) times daily.   Yes [provider]  lidocaine 4 % Place 1 patch  onto the skin daily. Apply to left side for 2 weeks   Yes [provider]  melatonin 5 MG TABS Take 10 mg by mouth at bedtime.   Yes [provider]  ondansetron (ZOFRAN) 4 MG tablet Take 4 mg by mouth every 8 (eight) hours as needed for nausea or vomiting.   Yes [provider]  pantoprazole (PROTONIX) 40 MG tablet Take 1 tablet (40 mg total) by mouth daily. 06/07/22  Yes Thurnell Lose, MD  potassium chloride (KLOR-CON M) 10 MEQ tablet Take 10 mEq by mouth daily.   Yes [provider]  propranolol ER (INDERAL LA) 60 MG 24 hr capsule TAKE 1 CAPSULE (60 MG TOTAL) BY MOUTH DAILY. Patient taking differently: Take 60 mg by mouth daily. 05/16/22  Yes Elsie Stain, MD     Vital Signs: BP 123/87 (BP Location: Left Arm)   Pulse 65   Temp (!) 97.5 F (36.4 C) (Oral)   Resp 17   Ht 5\' 9"  (1.753 m)   Wt 170 lb (77.1 kg)   SpO2 98%   BMI 25.10 kg/m   Physical Exam Vitals and nursing note reviewed.  Constitutional:      General: He is not in acute distress. Cardiovascular:     Rate and Rhythm: Normal rate.  Pulmonary:     Effort: Pulmonary effort is normal.  Abdominal:     Comments: (+) perc chole to  gravity with ~15 cc clear yellow output with debris and small amount of blood. Drain flushed with minimal resistance at first but then flushed easily, aspirated large amount of debris. Tubing to collection bag also flushed with 10 cc NS due to presence of debris.  Neurological:     Mental Status: He is alert.     Imaging: IR EXCHANGE BILIARY DRAIN  Result Date: 07/06/2022 INDICATION: 81 year old male with a history of percutaneous cholecystostomy 06/02/2022, presents for exchange given the damaged drain EXAM: IMAGE GUIDED DRAIN EXCHANGE MEDICATIONS: None ANESTHESIA/SEDATION: None FLUOROSCOPY: Radiation Exposure Index (as provided by the fluoroscopic device): 6 mGy [place] COMPLICATIONS: None PROCEDURE: Informed written consent was obtained from the  patient after a thorough discussion of the procedural risks, benefits and alternatives. All questions were addressed. Maximal Sterile Barrier Technique was utilized including caps, mask, sterile gowns, sterile gloves, sterile drape, hand hygiene and skin antiseptic. A timeout was performed prior to the initiation of the procedure. Patient positioned supine position under the image intensifier the right upper quadrant including the indwelling drain were prepped and draped in the usual sterile fashion. Scout images were acquired. Contrast was injected in a make shift manner through the damage drain confirming location within the gallbladder. 1% lidocaine was used for local anesthesia. The retention suture was cut, drain was amputated, and modified Seldinger technique was used to place a new 10 [place] drain into the gallbladder lumen. Pigtail catheter was formed, contrast was injected confirming location, and the drain was sutured in position. Attached to gravity drain. Final images were stored. Patient tolerated the procedure well and remained hemodynamically stable throughout. No complications were encountered and no significant blood loss. IMPRESSION: Status post routine exchange of a fracture percutaneous cholecystostomy with a new 10 French drain placed. Signed, Dulcy Fanny. Nadene Rubins, RPVI [facility] Radiology Electronically Signed   By: Corrie Mckusick D.O.   On: 07/06/2022 16:49   CT ABDOMEN PELVIS W CONTRAST  Result Date: 07/05/2022 CLINICAL DATA:  Left lower quadrant abdominal pain EXAM: CT ABDOMEN AND PELVIS WITH CONTRAST TECHNIQUE: Multidetector CT imaging of the abdomen and pelvis was performed using the standard protocol following bolus administration of intravenous contrast. RADIATION DOSE REDUCTION: This exam was performed according to the departmental dose-optimization program which includes automated exposure control, adjustment of the mA and/or  kV according to patient size and/or use of iterative reconstruction technique. CONTRAST:  71mL OMNIPAQUE IOHEXOL 350 MG/ML SOLN COMPARISON:  CT abdomen and pelvis 06/02/2022 FINDINGS: Lower chest: Biatrial enlargement.  No acute abnormality. Hepatobiliary: Cholecystostomy tube in the decompressed gallbladder. Cholelithiasis. No suspicious focal hepatic lesion. No biliary dilation. Pancreas: Unremarkable. Spleen: Unremarkable. Adrenals/Urinary Tract: Adrenal glands are unremarkable. Low-attenuation lesions in the kidneys are statistically likely to represent cysts. No follow-up is required. Distended bladder. Mild bilateral hydro ureter. No urinary calculi. Stomach/Bowel: Normal caliber large and small bowel. Normal stomach. Normal appendix. No bowel wall thickening. Moderate colonic stool load. Vascular/Lymphatic: Aortic atherosclerosis. No enlarged abdominal or pelvic lymph nodes. enlargement of the prostate. Reproductive: Mild enlargement of the prostate. Other: Large left inguinal hernia containing a loop of nonobstructed ileum. No free intraperitoneal fluid or air. Musculoskeletal: No acute osseous abnormality. Right convex thoracolumbar curve. IMPRESSION: 1. Interval placement of percutaneous cholecystostomy tube within the decompressed bladder. Cholelithiasis. 2. Marked distention of the bladder with bilateral hydroureter. Query obstructive uropathy given enlarged prostate. 3. Large left inguinal hernia containing a loop of nondilated ileum 4.  Aortic Atherosclerosis (ICD10-I70.0). Electronically Signed  By: Placido Sou M.D.   On: 07/05/2022 20:17   DG Chest Port 1 View  Result Date: 07/05/2022 CLINICAL DATA:  Possible sepsis EXAM: PORTABLE CHEST 1 VIEW COMPARISON:  03/30/2022 FINDINGS: Transverse diameter of heart is slightly increased. There are no signs of pulmonary edema or focal pulmonary consolidation. There is no pleural effusion or pneumothorax. A skin fold is noted overlying the right upper  lung field and the superior mediastinum. IMPRESSION: No active disease. Electronically Signed   By: Elmer Picker M.D.   On: 07/05/2022 16:25    Labs:  CBC: Recent Labs    06/06/22 0322 07/05/22 1633 07/06/22 0440 07/07/22 0032  WBC 5.4 8.0 7.5 5.0  HGB 12.7* 14.0 13.2 12.5*  HCT 36.6* 41.6 38.1* 36.5*  PLT 236 314 285 238    COAGS: Recent Labs    03/30/22 1425 06/02/22 1905 06/02/22 1905 06/03/22 2128 06/04/22 0303 06/05/22 0750 07/05/22 1633  INR 1.4* 2.7*  --   --   --   --  2.0*  APTT  --  48*   < > 98* 93* 138* 48*   < > = values in this interval not displayed.    BMP: Recent Labs    06/06/22 0322 07/05/22 1633 07/05/22 1855 07/06/22 0440 07/07/22 0032  NA 136 138  --  136 136  K 3.9 5.5* 5.1 4.8 4.4  CL 108 101  --  104 106  CO2 21* 23  --  21* 21*  GLUCOSE 85 92  --  74 102*  BUN 12 22  --  23 21  CALCIUM 7.9* 8.9  --  8.4* 8.1*  CREATININE 0.92 1.71*  --  1.61* 1.38*  GFRNONAA >60 40*  --  43* 52*    LIVER FUNCTION TESTS: Recent Labs    06/06/22 0322 07/05/22 1633 07/06/22 0440 07/07/22 0032  BILITOT 1.4* 1.3* 1.1 0.9  AST 15 11* 8* 10*  ALT 5 <5 <5 <5  ALKPHOS 73 108 100 93  PROT 4.9* 6.4* 5.6* 4.9*  ALBUMIN 2.0* 2.8* 2.5* 2.2*    Assessment and Plan:  81 y/o M with history of acute cholecystitis s/p percutaneous cholecystostomy placement 06/02/22 who presented to the ED yesterday with a broken drainage catheter. He underwent drain exchange yesterday in IR and is planned for d/c back to SNF likely today.  Drain with minimal output since replacement however patient reports little PO intake today. Drain flushed and aspirated today at bedside with return of clear bilious fluid with large amounts of debris present. Tubing to bag was also flushed with 10 cc NS.  Recommend flushing drain QD with 5 cc NS, recording output when bag is emptied or at least once per day and dressing changes every 2-3 days or earlier if soiled on  discharge.  Patient to return for cholangiogram and drain exchange in about 6-8 weeks unless he undergoes cholecystectomy before that time (per chart surgical follow up planned for 08/08/22). Order has been placed for this and IR scheduler will call to setup appointment.  Electronically Signed: Joaquim Nam, PA-C 07/07/2022, 11:46 AM   I spent a total of 15 Minutes at the the patient's bedside AND on the patient's hospital floor or unit, greater than 50% of which was counseling/coordinating care for cholecystostomy follow up.

## 2022-07-07 NOTE — TOC Progression Note (Signed)
Transition of Care [facility]) - Initial/Assessment Note    Patient Details  Name: Sean Ward MRN: [id number] Date of Birth: 08-Mar-1941  Transition of Care [facility]) CM/SW Contact:    Milinda Antis, [place] Phone Number: 07/07/2022, 10:43 AM  Clinical Narrative:                 CSW contacted [facility] SNF and was informed that the facility can accept the patient today if medically ready.          Patient Goals and CMS Choice        Expected Discharge Plan and Services                                                Prior Living Arrangements/Services                       Activities of Daily Living Home Assistive Devices/Equipment: Wheelchair ADL Screening (condition at time of admission) Is the patient deaf or have difficulty hearing?: No Does the patient have difficulty seeing, even when wearing glasses/contacts?: No Does the patient have difficulty concentrating, remembering, or making decisions?: No Does the patient have difficulty dressing or bathing?: Yes Does the patient have difficulty walking or climbing stairs?: Yes  Permission Sought/Granted                  Emotional Assessment              Admission diagnosis:  AKI (acute kidney injury) ([place]) [N17.9] Cholecystostomy tube dysfunction [T85.518A] Patient Active Problem List   Diagnosis Date Noted   Lactic acidosis 07/06/2022   AKI (acute kidney injury) ([place]) 07/06/2022   Elevated lipase 07/06/2022   Cholecystostomy tube dysfunction 07/05/2022   Acute cholecystitis 06/02/2022   Sepsis ([place]) 06/02/2022   Pressure injury of skin 04/01/2022   Rhabdomyolysis 03/30/2022   Dehydration 03/30/2022   Psoriasis 11/13/2021   Dermatochalasis of both upper eyelids 08/04/2021   Neck pain 05/10/2021   Other idiopathic scoliosis, 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, [place] [address] Phone: [phone] Fax: [phone]     Social Determinants of Health (SDOH) Interventions    Readmission Risk Interventions     No data to display

## 2022-07-07 NOTE — Plan of Care (Signed)

## 2022-07-09 LAB — URINE CULTURE: Culture: >=100000 — AB

## 2022-07-10 LAB — CULTURE, BLOOD (ROUTINE X 2): Culture: NO GROWTH

## 2022-07-12 ENCOUNTER — Ambulatory Visit: Payer: Medicare Other | Admitting: Critical Care Medicine

## 2022-07-12 NOTE — Progress Notes (Deleted)
Established Patient Office Visit  Subjective   Patient ID: Sean MilletVictor Ward, male    DOB: [birth year]  Age: 81 y.o. MRN: [id number] Virtual Visit via Telephone Note  I connected with Sean Ward on 07/12/22 at  9:30 AM EDT by telephone and verified that I am speaking with the correct person using two identifiers.   Consent:  I discussed the limitations, risks, security and privacy concerns of performing an evaluation and management service by telephone and the availability of in person appointments. I also discussed with the patient that there may be a patient responsible charge related to this service. The patient expressed understanding and agreed to proceed.  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  05/09/22 This patient was recently found after 4 days lying on the floor in his home with severe rhabdomyolysis and other electrolyte disturbances.  The patient was taken to the hospital in early June and discharge summary is as documented below  Patient initially was placed at [facility] home and he stayed there about 28 days and ran out of his days on his Medicare plan.  He thinks he has [place] Medicare advantage but he may have only red-white and blue Medicare.  The patient was discharged to a rooming house he cannot stay there long he went to the ER several times and eventually went and was placed at a [address]  He is in room [facility]  The patient was to see me today for posthospital visit after multiple ER visits and his hospital stay in June.  Unfortunately the patient  could not get out of his room and meet the [employer] [place] lift driver at the front entrance of the hotel.  He essentially is wheelchair-bound and his hotel room at this time.  He was staying in the bed and had para medicine coming and putting him in the wheelchair during the day but they no longer provide this service.  The hotel individuals are helping him with food access.  Essentially the patient's been sleeping in his wheelchair for the past 3 nights.  Situations becoming quite desperate as he is starting to run out of medications.  Patient's not really in any pain but he is having to underdose his Sinemet he is supposed to be on 1-1/2 25/100 Sinemet 5 times during the day and then he takes the 50/200 dose at bedtime.  He has only been getting the single 25/100 dose of Sinemet and is underdosing.  His parkinsonism has significantly worsened.  He has significant tremor.  He is able to swallow food and liquids.  He has tramadol as needed for pain but is not in any pain right now as long as he does not weight-bear on his right leg.  He also has Tylenol to take as well.  He has methocarbamol for muscle relaxation he is not needing.  Patient has omeprazole for gastric reflux but takes it only as needed.  The patient's not been taking his propranolol long-acting and his heart rates been in the 1 10-1 20 range according to the patient.  He has been taking his Eliquis  for his atrial fibrillation but only has about 3 days doses left.  The patient denies any other complaints at this time.  He has a ex-wife who lives in [place] and 2 children but they are not available to him.  Adult Protective Services has been notified as well as the [facility] for aging and adult care.  They are trying to help the patient in this regard.  The patient would like to not go back to [facility].  He does have some money in the bank and he is not without resources.  He is also willing to pay for a personal care services out of his own  pocket.  He clearly is not thriving at the motel room at this time.  9/21 HFU   Parkinson disease (HCC) I was able to get his 25/100 Sinemet cut in half through assistance through the hotel  He will go back to the 1-1/2 5 times daily dosing to improve parkinsonism and tremor control  He will continue to the 50/200 dose at bedtime  Rhabdomyolysis Based on his history over the phone his rhabdomyolysis has resolved but he will need follow-up labs  Primary osteoarthritis of right knee He needs a knee replacement on the right but he cannot achieve this until he is stronger and his parkinsonism is under better control  Other idiopathic scoliosis, lumbar region Back pain is not a major issue it is the arthritis in the right knee that is the biggest issue at this time  Unsteady gait The patient is a very high fall risk he is fallen at least 5 times over the past 2 months and is driven by his severe parkinsonism his imbalance and his associated arthritis of the spine and right lower extremity  Given his high risk he will need placement in long-term care I will process an FL 2 and communicate with APS  Tremor The tremor is worsening it would be improved if he could take his Sinemet on a regular basis to get better access to medications  Pressure injury of skin This will be needed reassessment I am concerned about this as he has been sitting in the wheelchair constantly  Dyslipidemia Currently not taking statin therapy  Dehydration He appears to be getting hydration with water in the apartment but needs to be reassessed with labs  Atrial fibrillation [facility]) The patient has long-acting propranolol left he will start this again once daily and continue Eliquis twice daily   Recent Readmission: Admit date: 07/05/2022 Discharge date: 07/07/2022 30 Day Unplanned Readmission Risk Score    Flowsheet Row ED to Hosp-Admission (Discharged) from 06/02/2022 in [facility]  [facility] 30 Day Unplanned Readmission Risk Score (%) 19.15 Filed at 06/06/2022 1200         This score is the patient's risk of an unplanned readmission within 30 days of being discharged (0 -100%). The score is based on dignosis, age, lab data, medications, orders, and past utilization.   Low:  0-14.9   Medium: 15-21.9   High: 22-29.9   Extreme: 30 and above               Admitted From: SNF Disposition:  SNF   Recommendations for Outpatient Follow-up:  1. Follow up with PCP in 1-2 weeks 2. Please obtain BMP/CBC in one week 3. Please follow up with your PCP on the following pending results: Unresulted Labs (From admission, onward)     None  Home Health: None Equipment/Devices: None   Discharge Condition: Stable CODE STATUS: Full code Diet recommendation:    Subjective: Seen and examined.  No complaints.   Brief/Interim Summary: Sean Ward is a 81 y.o. male with medical history significant of permanent atrial fibrillation on Eliquis, Parkinson's disease who presented to [facility] ED via EMS from [facility] due to cholecystotomy tube dysfunction.  While he was being transferred from the wheelchair to the bed at the facility.  EMS was activated, he was noted to be hypotensive and was taken to the ED for further evaluation and management.  IR was consulted.  Drain was exchanged on 07/06/2022 which is working now and has been cleared by IR for discharge.  Patient has no complaints.  He will be discharged in stable condition today.   AKI, likely prerenal in the setting of dehydration. Baseline creatinine appears to be 0.9 with GFR greater than 60 Presented with creatinine of 1.71 with GFR of 40.  Creatinine improved 1.38 today.  He is still getting fluids until he gets discharged hopefully his creatinine will be normalized in 1 to 2 days.   Lactic acidosis likely secondary to severe dehydration. Resolved.   Hypoalbuminemia secondary to moderate protein calorie  malnutrition Albumin 2.8, protein supplement to be provided   Hyperkalemia-resolved   Permanent atrial fibrillation on Eliquis. Rate controlled  Prior to admission on Eliquis and propanolol Home Eliquis on hold until okayed to restart by interventional radiology.   Parkinson disease Continue Sinemet   GERD Continue Protonix   Discharge plan was discussed with patient and/or family member and they verbalized understanding and agreed with it.  Discharge Diagnoses:  Principal Problem:   Cholecystostomy tube dysfunction Active Problems:   Atrial fibrillation (HCC)   Parkinson disease (HCC)   Dehydration   Lactic acidosis   AKI (acute kidney injury) (HCC)   Elevated lipase    Below is a copy of the discharge summary Admit date: 03/30/2022 Discharge date: 04/03/2022   Admitted From: Home Disposition: Skilled nursing facility   Recommendations for Outpatient Follow-up:  Follow up with PCP in 1-2 weeks Schedule follow-up with orthopedics after discharge from rehab   Home Health: N/A Equipment/Devices: N/A   Discharge Condition: Stable CODE STATUS: Full code Diet recommendation: Low-salt diet   Discharge summary: 81 year old with history of Parkinson's disease, paroxysmal A-fib on Eliquis, severe right knee arthritis fell on the floor of his apartment and could not get up for 4 days, kept yelling out for help and finally someone heard him after 4 days and called EMS and brought to ER.  Denied any trauma but he had some bruise on the right hip area. In the emergency room hemodynamically stable.  Sodium 147.  Creatinine kinase 2700.  A skeletal survey negative. Treated in the hospital.  He remained in the hospital waiting for SNF availability.    Assessment & Plan:   Traumatic rhabdomyolysis, fall, ambulatory dysfunction due to severe right knee pain, moderate dehydration and hyponatremia: -Treated with IV fluids with clinical improvement.  Encourage oral intake.  Adequately  improved. -PT OT, fall precautions, refer for skilled nursing facility for inpatient therapies. -Unable to get up because of right knee arthritis, if he does go to rehab, he may be able to get right knee arthroplasty and improve his mobility.  He is following up with Dr. Lajoyce Corners.   Paroxysmal A-fib: No evidence of injury or bleeding.  Rate controlled in sinus rhythm on propanolol.  Continue Eliquis.   Parkinson's disease: Fairly  controlled on Sinemet.  Continue as he takes at home.  Patient takes Sinemet 5 times a day.   Hypokalemia/hypophosphatemia: Replaced and adequate.   Stable to discharge to SNF level of care.   Discharge Diagnoses:  Principal Problem:   Rhabdomyolysis Active Problems:   Atrial fibrillation (HCC)   Parkinson disease (HCC)   Dehydration   Pressure injury of skin   Patient Active Problem List   Diagnosis Date Noted   Lactic acidosis 07/06/2022   AKI (acute kidney injury) (HCC) 07/06/2022   Elevated lipase 07/06/2022   Cholecystostomy tube dysfunction 07/05/2022   Acute cholecystitis 06/02/2022   Sepsis (HCC) 06/02/2022   Pressure injury of skin 04/01/2022   Rhabdomyolysis 03/30/2022   Dehydration 03/30/2022   Psoriasis 11/13/2021   Dermatochalasis of both upper eyelids 08/04/2021   Neck pain 05/10/2021   Other idiopathic scoliosis, 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   Lumbar back pain 05/10/2021   Parkinson disease (HCC) 02/28/2018   Primary osteoarthritis of right knee 08/16/2016   Dyslipidemia 08/16/2016   Unsteady gait 05/24/2016   Atrial fibrillation (HCC) 10/27/2014   Onychomycosis 05/03/2014   Tremor 04/08/2014   Venous stasis 03/25/2014   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   History of bronchitis    Inguinal hernia, right 04/08/2014   Parkinson's disease (HCC)    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HCC)     Scoliosis of cervicothoracic spine    Scoliosis of thoracolumbar spine    Spondylosis of lumbar spine    Past Surgical History:  Procedure Laterality Date   HERNIA REPAIR     IR EXCHANGE BILIARY DRAIN  07/06/2022   IR PERC CHOLECYSTOSTOMY  06/02/2022   IR RADIOLOGIST EVAL & MGMT  06/26/2022   TONSILLECTOMY     Social History   Tobacco Use   Smoking status: Never   Smokeless tobacco: Never  Vaping Use   Vaping Use: Never used  Substance Use Topics   Alcohol use: Yes    Alcohol/week: 3.0 standard drinks of alcohol    Types: 3 Glasses of wine per week    Comment: 1 glass of red wine with dinner some nights   Drug use: No   Social History   Socioeconomic History   Marital status: Legally Separated    Spouse name: Not on file   Number of children: 2   Years of education: BA   Highest education level: Not on file  Occupational History   Not on file  Tobacco Use   Smoking status: Never   Smokeless tobacco: Never  Vaping Use   Vaping Use: Never used  Substance and Sexual Activity   Alcohol use: Yes    Alcohol/week: 3.0 standard drinks of alcohol    Types: 3 Glasses of wine per week    Comment: 1 glass of red wine with dinner some nights   Drug use: No   Sexual activity: Not Currently  Other Topics Concern   Not on file  Social History Narrative   Drinks 2 cups of coffee a day plus tea, 3 cup/day   Social Determinants of Health   Financial Resource Strain: Not on file  Food Insecurity: No Food Insecurity (07/06/2022)   Hunger Vital Sign    Worried About Running Out of Food in the Last Year: Never true    Ran Out of Food in the Last  Year: Never true  Transportation Needs: No Transportation Needs (07/06/2022)   PRAPARE - Hydrologist (Medical): No    Lack of Transportation (Non-Medical): No  Recent Concern: Transportation Needs - Unmet Transportation Needs (05/07/2022)   PRAPARE - Hydrologist (Medical): Yes     Lack of Transportation (Non-Medical): Yes  Physical Activity: Not on file  Stress: Not on file  Social Connections: Not on file  Intimate Partner Violence: Not At Risk (07/06/2022)   Humiliation, Afraid, Rape, and Kick questionnaire    Fear of Current or Ex-Partner: No    Emotionally Abused: No    Physically Abused: No    Sexually Abused: No   Family Status  Relation Name Status   Mother  Deceased   Father  Deceased   Sister  Alive   Neg Hx  (Not Specified)   Family History  Problem Relation Age of Onset   Deafness Mother    Deafness Sister    Heart disease Neg Hx    No Known Allergies    Review of Systems  Constitutional:  Negative for chills, diaphoresis, fever, malaise/fatigue and weight loss.  HENT:  Negative for congestion, hearing loss, nosebleeds, sore throat and tinnitus.   Eyes:  Negative for blurred vision, photophobia and redness.  Respiratory:  Negative for cough, hemoptysis, sputum production, shortness of breath, wheezing and stridor.   Cardiovascular:  Negative for chest pain, palpitations, orthopnea, claudication, leg swelling and PND.  Gastrointestinal:  Negative for abdominal pain, blood in stool, constipation, diarrhea, heartburn, nausea and vomiting.  Genitourinary:  Negative for dysuria, flank pain, frequency, hematuria and urgency.  Musculoskeletal:  Positive for falls and joint pain. Negative for back pain, myalgias and neck pain.  Skin:  Negative for itching and rash.  Neurological:  Positive for tremors and weakness. Negative for dizziness, tingling, sensory change, speech change, focal weakness, seizures, loss of consciousness and headaches.  Endo/Heme/Allergies:  Negative for environmental allergies and polydipsia. Does not bruise/bleed easily.  Psychiatric/Behavioral:  Positive for depression. Negative for memory loss, substance abuse and suicidal ideas. The patient is nervous/anxious. The patient does not have insomnia.       Objective:      There were no vitals taken for this visit. BP Readings from Last 3 Encounters:  07/07/22 113/74  06/06/22 (!) 131/93  05/24/22 119/88   Wt Readings from Last 3 Encounters:  07/05/22 170 lb (77.1 kg)  05/21/22 160 lb (72.6 kg)  05/02/22 163 lb 9.3 oz (74.2 kg)     No exam is done as this is a telephone visit and no vital signs could be obtained but he states his heart rate is about 120 when he takes it   No results found for any visits on 07/12/22.  Last CBC Lab Results  Component Value Date   WBC 5.0 07/07/2022   HGB 12.5 (L) 07/07/2022   HCT 36.5 (L) 07/07/2022   MCV 91.7 07/07/2022   MCH 31.4 07/07/2022   RDW 14.1 07/07/2022   PLT 238 38/07/1750   Last metabolic panel Lab Results  Component Value Date   GLUCOSE 102 (H) 07/07/2022   NA 136 07/07/2022   K 4.4 07/07/2022   CL 106 07/07/2022   CO2 21 (L) 07/07/2022   BUN 21 07/07/2022   CREATININE 1.38 (H) 07/07/2022   GFRNONAA 52 (L) 07/07/2022   CALCIUM 8.1 (L) 07/07/2022   PHOS 2.9 07/07/2022   PROT 4.9 (L) 07/07/2022  ALBUMIN 2.2 (L) 07/07/2022   LABGLOB 2.0 02/14/2022   AGRATIO 1.9 02/14/2022   BILITOT 0.9 07/07/2022   ALKPHOS 93 07/07/2022   AST 10 (L) 07/07/2022   ALT <5 07/07/2022   ANIONGAP 9 07/07/2022   Last lipids Lab Results  Component Value Date   CHOL 165 06/01/2020   HDL 51 06/01/2020   LDLCALC 97 06/01/2020   TRIG 91 06/01/2020   CHOLHDL 3.2 06/01/2020   Last hemoglobin A1c Lab Results  Component Value Date   HGBA1C 6.0 (H) 02/14/2022   Last thyroid functions Lab Results  Component Value Date   TSH 3.280 02/14/2022   Last vitamin D No results found for: "25OHVITD2", "25OHVITD3", "VD25OH" Last vitamin B12 and Folate Lab Results  Component Value Date   VITAMINB12 988 05/10/2016   FOLATE >24.0 05/10/2016      The ASCVD Risk score (Arnett DK, et al., 2019) failed to calculate for the following reasons:   The 2019 ASCVD risk score is only valid for ages 13 to 73     Assessment & Plan:  I personally reviewed all images and lab data in the [facility] system as well as any outside material available during this office visit and agree with the  radiology impressions.   No problem-specific Assessment & Plan notes found for this encounter.   There are no diagnoses linked to this encounter.  Note he has located his eyedrops for his glaucoma and is taking  Follow Up Instructions: Patient knows an FL 2 will be produced and we will try to get him placed into a long-term care facility as soon as possible, the patient also knows he may have to go back to the hospital if were not able to achieve this to the outpatient realm We will attempt to get him into the office as well when he settles into the nursing home I discussed the assessment and treatment plan with the patient. The patient was provided an opportunity to ask questions and all were answered. The patient agreed with the plan and demonstrated an understanding of the instructions.   The patient was advised to call back or seek an in-person evaluation if the symptoms worsen or if the condition fails to improve as anticipated.  I provided 30 minutes of non-face-to-face time during this encounter  including  median intraservice time , review of notes, labs, imaging, medications  and explaining diagnosis and management to the patient .    Shan Levans, MD

## 2022-07-14 LAB — SUSCEPTIBILITY, AER + ANAEROB

## 2022-07-14 LAB — SUSCEPTIBILITY RESULT

## 2022-07-19 ENCOUNTER — Telehealth (HOSPITAL_COMMUNITY): Payer: Self-pay

## 2022-07-19 LAB — CULTURE, BLOOD (ROUTINE X 2): Special Requests: ADEQUATE

## 2022-07-19 NOTE — Telephone Encounter (Signed)
-----   Message from [facility] sent at 06/26/2022  3:58 PM EDT ----- Regarding: Schedule 3 Month Cholangiogram with Exchange Interventional Radiology Procedure Request  Name: Sean Ward [birth year]  Procedure: 3 month Cholangiogram with Exchange  Reason for procedure: Cholecystitis  Relevant History: In epic  - All future drain appts to go to hospital due to mobility  Order Physician: Blackmon/Wagner  Office Contact: Me No precert req Thank you

## 2022-07-30 ENCOUNTER — Ambulatory Visit: Payer: Medicare Other | Admitting: Neurology

## 2022-07-30 ENCOUNTER — Encounter: Payer: Self-pay | Admitting: Neurology

## 2022-07-30 ENCOUNTER — Telehealth: Payer: Self-pay | Admitting: *Deleted

## 2022-07-30 NOTE — Telephone Encounter (Signed)
Pt did not show for appt today

## 2022-08-06 ENCOUNTER — Ambulatory Visit: Payer: Medicare Other | Admitting: Neurology

## 2022-08-06 ENCOUNTER — Telehealth: Payer: Self-pay | Admitting: Neurology

## 2022-08-06 ENCOUNTER — Other Ambulatory Visit (HOSPITAL_COMMUNITY): Payer: Self-pay

## 2022-08-06 NOTE — Telephone Encounter (Signed)
Patient missed another appointment today, 08/06/2022. He missed an appointment on 06/07/2022, due to hospitalization for cholecystitis. He also missed appointments on 06/21/2022 and on 07/30/2022.

## 2022-08-06 NOTE — Telephone Encounter (Signed)
Shauntae @ [facility] asked if pt could be rescheduled, phone rep informed her that since pt has No Showed an appointment in Aug and 2 in Oct a message would be sent to management re: what would be done as far as the request to schedule again.

## 2022-08-07 ENCOUNTER — Other Ambulatory Visit: Payer: Self-pay

## 2022-08-20 ENCOUNTER — Ambulatory Visit (HOSPITAL_COMMUNITY)
Admission: RE | Admit: 2022-08-20 | Discharge: 2022-08-20 | Disposition: A | Discharge status: Home / Self Care | Payer: Medicare Other | Source: Ambulatory Visit | Attending: Physician Assistant | Admitting: Physician Assistant

## 2022-08-20 ENCOUNTER — Other Ambulatory Visit (HOSPITAL_COMMUNITY): Payer: Self-pay | Admitting: Physician Assistant

## 2022-08-20 DIAGNOSIS — K810 Acute cholecystitis: Secondary | ICD-10-CM | POA: Diagnosis not present

## 2022-08-20 DIAGNOSIS — Z434 Encounter for attention to other artificial openings of digestive tract: Secondary | ICD-10-CM | POA: Diagnosis not present

## 2022-08-20 HISTORY — PX: IR EXCHANGE BILIARY DRAIN: IMG6046

## 2022-08-20 MED ORDER — LIDOCAINE HCL 1 % IJ SOLN
INTRAMUSCULAR | Status: AC
  Administered 2022-08-20: 10 mL
  Filled 2022-08-20: qty 20

## 2022-08-20 MED ORDER — IOHEXOL 300 MG/ML  SOLN
50.0000 mL | Freq: Once | INTRAMUSCULAR | Status: AC | PRN
  Administered 2022-08-20: 10 mL

## 2022-08-20 NOTE — Procedures (Signed)
Interventional Radiology Procedure Note  Procedure: FLUORO CHOLECYSTOSTOMY TUBE EXCHG     Complications: None  Estimated Blood Loss:  0  Findings: 10 FR DRAIN EXCHGD    Tamera Punt, MD

## 2022-09-18 ENCOUNTER — Telehealth (HOSPITAL_COMMUNITY): Payer: Self-pay

## 2022-09-18 NOTE — Telephone Encounter (Signed)
Called to reschedule appt, per ex-wife, pt died on 14-Sep-2022. AW

## 2022-09-21 DEATH — deceased

## 2022-09-25 IMAGING — DX DG KNEE 1-2V PORT*R*
2 series · 2 of 2 positions shown · non-contrast
Comparison: None Available.

CLINICAL DATA: Found on the ground, right femur pain

EXAM:
RIGHT FEMUR PORTABLE 1 VIEW; PORTABLE RIGHT KNEE - 1-2 VIEW;
PORTABLE PELVIS 1-2 VIEWS

[knee ap]
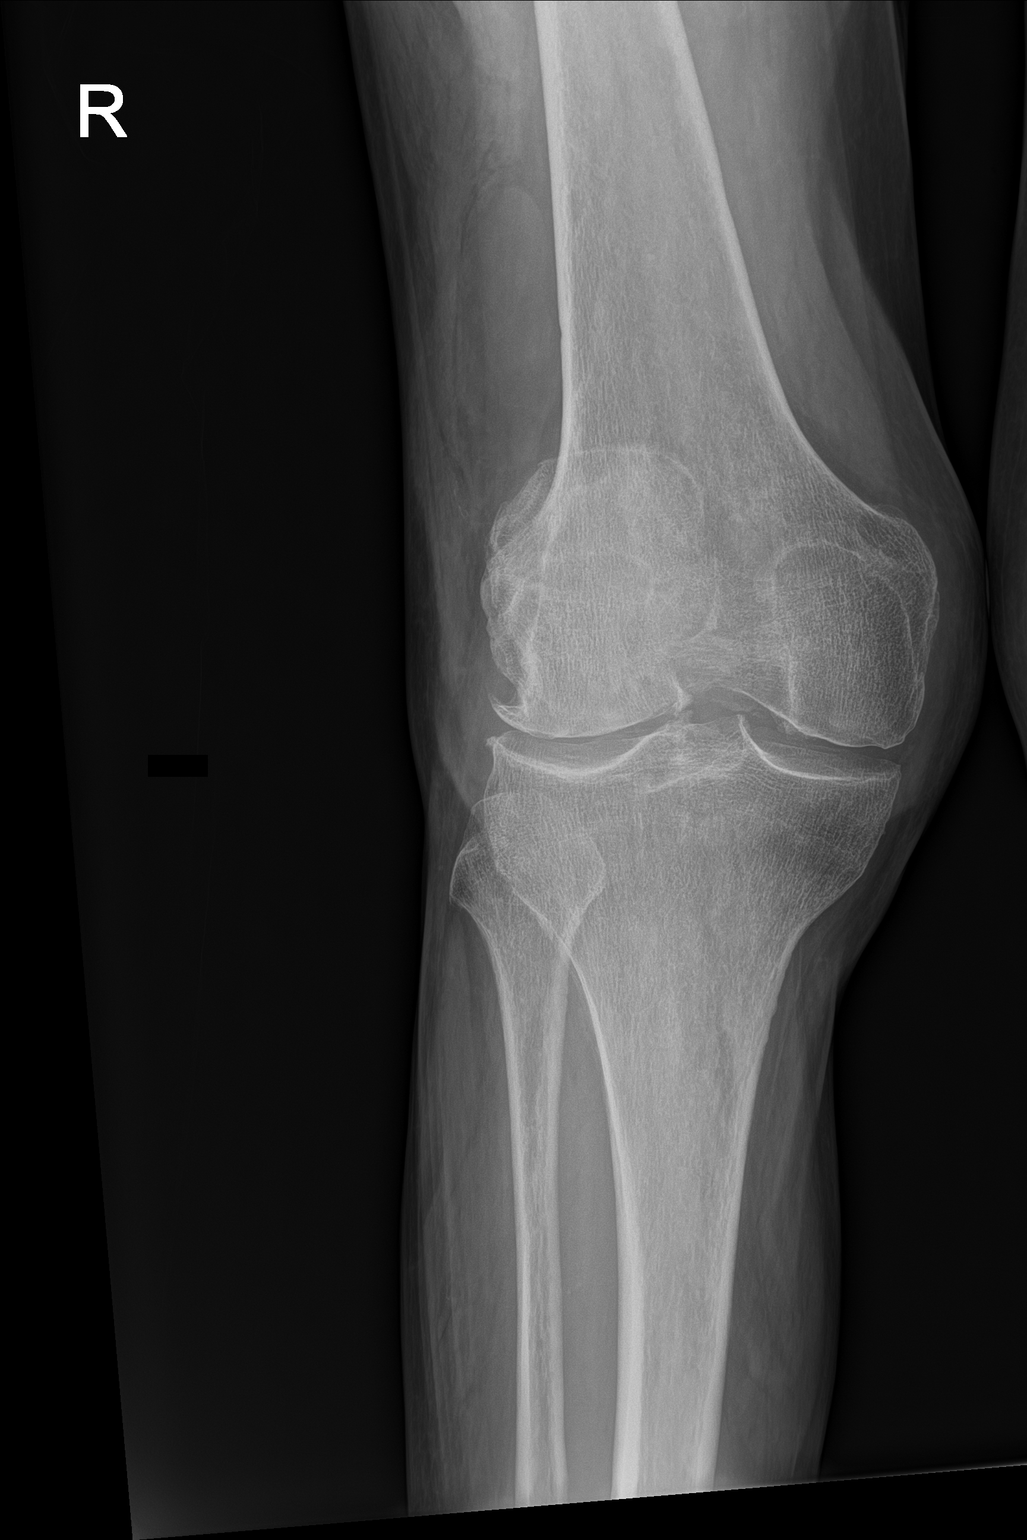

[knee lat]
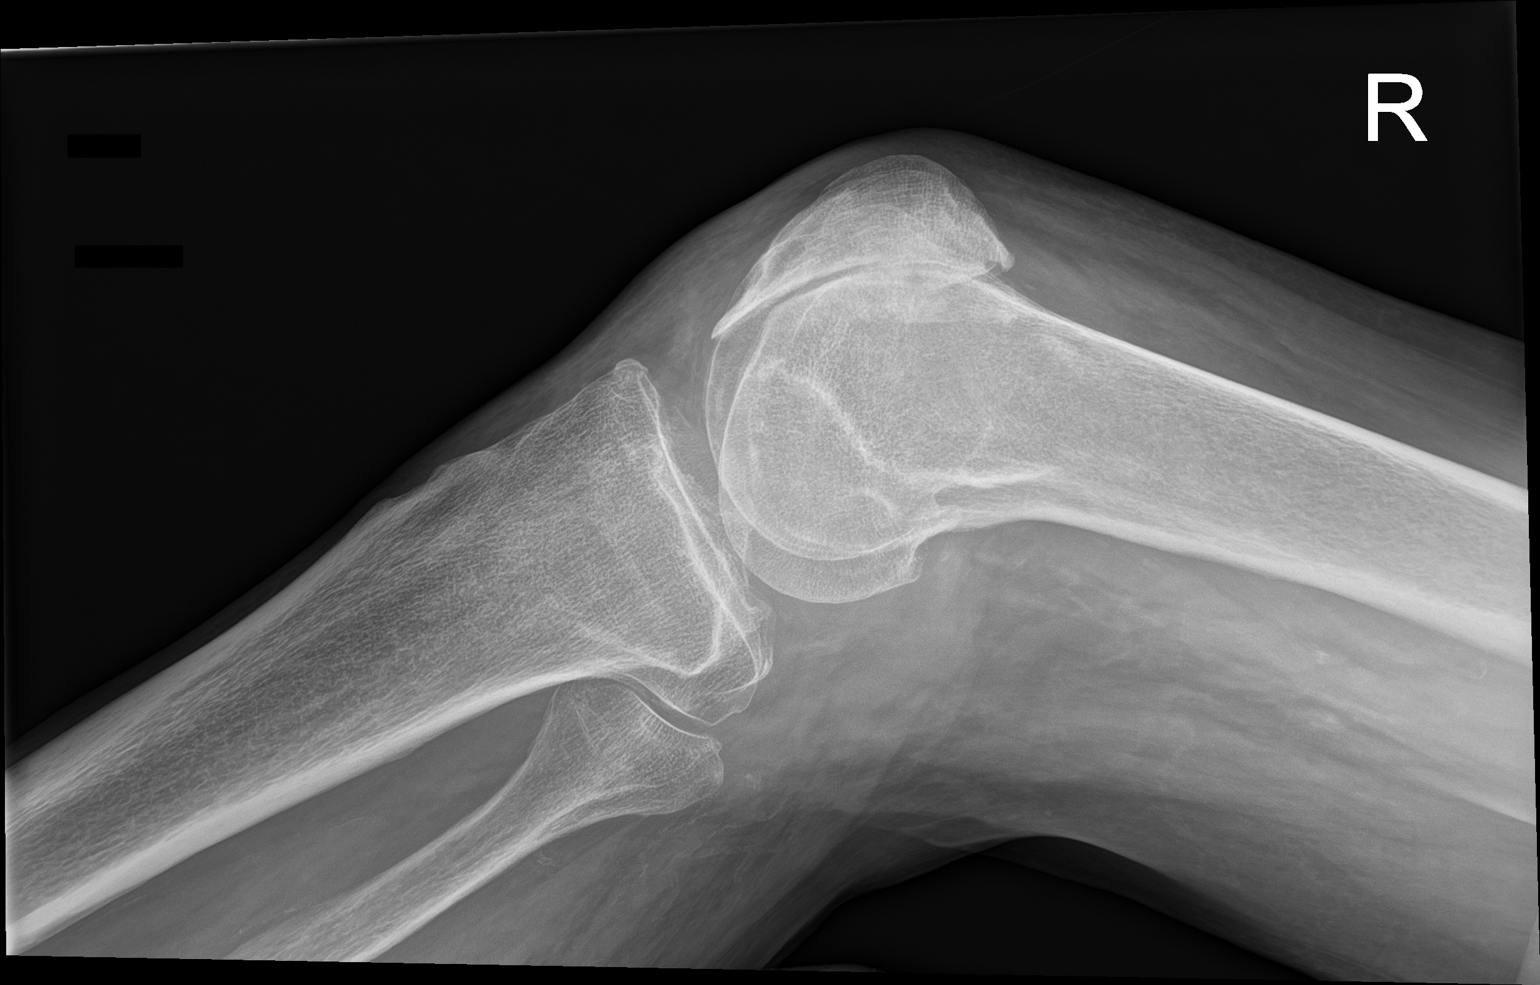

[2 of 2 positions shown; findings below may reference images not displayed]

FINDINGS: No acute hip fracture or dislocation. Mild osteoarthritis of the
medial and lateral femorotibial compartments. Severe osteoarthritis
of the patellofemoral compartment. No knee joint effusion. No
aggressive osseous lesion. Normal alignment. Generalized osteopenia.

Soft tissue are unremarkable. No radiopaque foreign body or soft
tissue emphysema.
IMPRESSION: 1.  No acute osseous injury of the right femur.
2.  No acute osseous injury of the right knee.
3.  No acute osseous injury of the pelvis.

## 2022-10-01 ENCOUNTER — Other Ambulatory Visit (HOSPITAL_COMMUNITY): Payer: Medicare Other

## 2022-11-16 ENCOUNTER — Other Ambulatory Visit: Payer: Self-pay

## 2022-12-07 ENCOUNTER — Other Ambulatory Visit: Payer: Self-pay
